# Patient Record
Sex: Male | Born: 1940 | Race: White | Hispanic: No | Marital: Married | Smoking: Former smoker
Health system: Southern US, Community
[De-identification: ages and names within clinical notes are randomized; demographics above are authoritative.]

## PROBLEM LIST (undated history)

## (undated) DIAGNOSIS — J45909 Unspecified asthma, uncomplicated: Secondary | ICD-10-CM

## (undated) DIAGNOSIS — M1990 Unspecified osteoarthritis, unspecified site: Secondary | ICD-10-CM

## (undated) DIAGNOSIS — C259 Malignant neoplasm of pancreas, unspecified: Secondary | ICD-10-CM

## (undated) DIAGNOSIS — R55 Syncope and collapse: Secondary | ICD-10-CM

## (undated) DIAGNOSIS — N200 Calculus of kidney: Secondary | ICD-10-CM

## (undated) DIAGNOSIS — E785 Hyperlipidemia, unspecified: Secondary | ICD-10-CM

## (undated) DIAGNOSIS — I509 Heart failure, unspecified: Secondary | ICD-10-CM

## (undated) DIAGNOSIS — F419 Anxiety disorder, unspecified: Secondary | ICD-10-CM

## (undated) DIAGNOSIS — I219 Acute myocardial infarction, unspecified: Secondary | ICD-10-CM

## (undated) DIAGNOSIS — G709 Myoneural disorder, unspecified: Secondary | ICD-10-CM

## (undated) DIAGNOSIS — D649 Anemia, unspecified: Secondary | ICD-10-CM

## (undated) DIAGNOSIS — Z9889 Other specified postprocedural states: Secondary | ICD-10-CM

## (undated) DIAGNOSIS — E119 Type 2 diabetes mellitus without complications: Secondary | ICD-10-CM

## (undated) DIAGNOSIS — I2510 Atherosclerotic heart disease of native coronary artery without angina pectoris: Secondary | ICD-10-CM

## (undated) DIAGNOSIS — C801 Malignant (primary) neoplasm, unspecified: Secondary | ICD-10-CM

## (undated) DIAGNOSIS — H269 Unspecified cataract: Secondary | ICD-10-CM

## (undated) DIAGNOSIS — K219 Gastro-esophageal reflux disease without esophagitis: Secondary | ICD-10-CM

## (undated) DIAGNOSIS — G4730 Sleep apnea, unspecified: Secondary | ICD-10-CM

## (undated) DIAGNOSIS — N400 Enlarged prostate without lower urinary tract symptoms: Secondary | ICD-10-CM

## (undated) HISTORY — PX: EYE SURGERY: SHX253

## (undated) HISTORY — DX: Hyperlipidemia, unspecified: E78.5

## (undated) HISTORY — DX: Atherosclerotic heart disease of native coronary artery without angina pectoris: I25.10

## (undated) HISTORY — DX: Syncope and collapse: R55

## (undated) HISTORY — DX: Other specified postprocedural states: Z98.890

## (undated) HISTORY — PX: OTHER SURGICAL HISTORY: SHX169

## (undated) HISTORY — DX: Sleep apnea, unspecified: G47.30

## (undated) HISTORY — DX: Anxiety disorder, unspecified: F41.9

## (undated) HISTORY — DX: Malignant neoplasm of pancreas, unspecified: C25.9

## (undated) HISTORY — DX: Benign prostatic hyperplasia without lower urinary tract symptoms: N40.0

## (undated) HISTORY — DX: Unspecified asthma, uncomplicated: J45.909

---

## 2003-12-22 HISTORY — PX: SPINE SURGERY: SHX786

## 2004-01-18 ENCOUNTER — Inpatient Hospital Stay (HOSPITAL_COMMUNITY): Admission: RE | Admit: 2004-01-18 | Discharge: 2004-01-21 | Payer: Self-pay | Admitting: Neurosurgery

## 2004-01-22 ENCOUNTER — Inpatient Hospital Stay (HOSPITAL_COMMUNITY): Admission: EM | Admit: 2004-01-22 | Discharge: 2004-01-25 | Payer: Self-pay | Admitting: Emergency Medicine

## 2005-06-25 ENCOUNTER — Ambulatory Visit: Payer: Self-pay | Admitting: Internal Medicine

## 2005-06-27 ENCOUNTER — Ambulatory Visit: Payer: Self-pay | Admitting: *Deleted

## 2005-07-30 ENCOUNTER — Ambulatory Visit: Payer: Self-pay | Admitting: Internal Medicine

## 2005-08-29 ENCOUNTER — Ambulatory Visit: Payer: Self-pay | Admitting: Internal Medicine

## 2006-03-15 ENCOUNTER — Emergency Department: Payer: Self-pay | Admitting: Emergency Medicine

## 2006-06-02 ENCOUNTER — Other Ambulatory Visit: Payer: Self-pay

## 2006-06-02 ENCOUNTER — Inpatient Hospital Stay: Payer: Self-pay | Admitting: Internal Medicine

## 2006-06-03 ENCOUNTER — Other Ambulatory Visit: Payer: Self-pay

## 2006-06-03 DIAGNOSIS — I2510 Atherosclerotic heart disease of native coronary artery without angina pectoris: Secondary | ICD-10-CM

## 2006-06-03 HISTORY — DX: Atherosclerotic heart disease of native coronary artery without angina pectoris: I25.10

## 2006-11-17 ENCOUNTER — Ambulatory Visit: Payer: Self-pay | Admitting: Otolaryngology

## 2006-11-19 ENCOUNTER — Ambulatory Visit: Payer: Self-pay | Admitting: Otolaryngology

## 2007-07-02 ENCOUNTER — Other Ambulatory Visit: Payer: Self-pay

## 2007-07-03 ENCOUNTER — Inpatient Hospital Stay: Payer: Self-pay | Admitting: Internal Medicine

## 2007-12-16 ENCOUNTER — Ambulatory Visit: Payer: Self-pay | Admitting: Internal Medicine

## 2010-04-08 ENCOUNTER — Inpatient Hospital Stay: Payer: Self-pay | Admitting: Internal Medicine

## 2010-06-06 ENCOUNTER — Ambulatory Visit: Payer: Self-pay | Admitting: Internal Medicine

## 2011-12-03 ENCOUNTER — Ambulatory Visit: Payer: Self-pay | Admitting: Cardiology

## 2011-12-04 ENCOUNTER — Encounter: Payer: Self-pay | Admitting: *Deleted

## 2011-12-04 ENCOUNTER — Encounter: Payer: Self-pay | Admitting: Surgery

## 2011-12-04 ENCOUNTER — Institutional Professional Consult (permissible substitution) (INDEPENDENT_AMBULATORY_CARE_PROVIDER_SITE_OTHER): Payer: Self-pay | Admitting: Surgery

## 2011-12-04 VITALS — BP 150/72 | HR 73 | Resp 16 | Ht 68.0 in | Wt 205.0 lb

## 2011-12-04 DIAGNOSIS — I2510 Atherosclerotic heart disease of native coronary artery without angina pectoris: Secondary | ICD-10-CM

## 2011-12-04 DIAGNOSIS — F419 Anxiety disorder, unspecified: Secondary | ICD-10-CM | POA: Insufficient documentation

## 2011-12-04 DIAGNOSIS — R55 Syncope and collapse: Secondary | ICD-10-CM | POA: Insufficient documentation

## 2011-12-04 DIAGNOSIS — E785 Hyperlipidemia, unspecified: Secondary | ICD-10-CM | POA: Insufficient documentation

## 2011-12-04 DIAGNOSIS — J45909 Unspecified asthma, uncomplicated: Secondary | ICD-10-CM | POA: Insufficient documentation

## 2011-12-04 DIAGNOSIS — G4730 Sleep apnea, unspecified: Secondary | ICD-10-CM | POA: Insufficient documentation

## 2011-12-04 DIAGNOSIS — N400 Enlarged prostate without lower urinary tract symptoms: Secondary | ICD-10-CM | POA: Insufficient documentation

## 2011-12-04 NOTE — Progress Notes (Signed)
[address]            [place] [address]          [phone]       PCP is Bethann Punches, MD Referring Provider is Dalia Heading, MD  Chief Complaint  Patient presents with  . Chest Pain    coronary artery disease...cathed by Dr. Lady Gary 12/03/11....eval for surgery    HPI:  The patient is 71 year old gentleman with diabetes and known coronary disease status post stenting of his right coronary artery in February 2008 by Dr. Lady Gary. He subsequently had an exercise treadmill test in February of 2009 that showed a left ventricular ejection fraction of 40% with mild global hypokinesis. There was inferolateral scar with no significant reversible ischemia and good exercise tolerance. He now presents with a 4-5 month history of intermittent substernal dull chest pressure and pain associated with shortness of breath that has progressed. He now has episodes while walking around his house. He underwent a myocardial perfusion stress test at the [facility] on July 8 which showed evidence of multivessel coronary disease with reversible ischemia involving the anterior, anterior lateral, lateral, and inferolateral walls as well as the apex. The test was stopped due to decrease in blood pressure and fatigue. The patient had chest pain during the procedure. He underwent cardiac catheterization at [facility] yesterday which showed a 40% ostial left main stenosis. The LAD had a calcified proximal tubular 75% stenosis. There is a very small first diagonal that has 99% stenosis. The left circumflex gave off an intermediate branch that had subtotal occlusion and appeared to be a smal to moderate size vessel. The left circumflex terminated as a moderate-sized third marginal vessel with a 75% stenosis. The right coronary was occluded proximally with filling of the distal vessel by collaterals from the left. Left ventricular function appeared well preserved. There is  no gradient across aortic valve.  Past Medical History  Diagnosis Date  . Diabetes mellitus   . Hyperlipidemia   . CAD (coronary artery disease) 06/03/06    cypher 2.5 x 28mm DES for 80% mid RCA  . H/O cardiac catheterization 06/03/06,12/03/11  . Sleep apnea   . Asthma   . BPH (benign prostatic hypertrophy)   . Anxiety   . Syncope     LAUGHING INDUCED    Past Surgical History  Procedure Date  . Spine surgery 9/05    CERVICAL LAMINECTOMY    History reviewed. No pertinent family history.  Social History History  Substance Use Topics  . Smoking status: Former Smoker    Types: Cigarettes, Cigars    Quit date: 12/03/1977  . Smokeless tobacco: Never Used  . Alcohol Use: No    Current Outpatient Prescriptions  Medication Sig Dispense Refill  . aspirin 81 MG chewable tablet Chew 81 mg by mouth daily.      . fish oil-omega-3 fatty acids 1000 MG capsule Take 1 g by mouth 2 (two) times daily.      . furosemide (LASIX) 40 MG tablet Take 40 mg by mouth daily.      Marland Kitchen gabapentin (NEURONTIN) 300 MG capsule Take 300 mg by mouth 2 (two) times daily.      . insulin glargine (LANTUS) 100 UNIT/ML injection Inject 60 Units into the skin 2 (two) times daily.       . Iron-Vitamin C (VITRON-C) 65-125  MG TABS Take by mouth 1 day or 1 dose.      . isosorbide mononitrate (IMDUR) 60 MG 24 hr tablet Take 60 mg by mouth daily.      Marland Kitchen lovastatin (MEVACOR) 40 MG tablet Take 40 mg by mouth at bedtime.      . meloxicam (MOBIC) 15 MG tablet Take 7.5 mg by mouth.      . metFORMIN (GLUMETZA) 1000 MG (MOD) 24 hr tablet Take 1,000 mg by mouth 2 (two) times daily with a meal.      . metoprolol tartrate (LOPRESSOR) 25 MG tablet Take 25 mg by mouth 1 day or 1 dose.      Marland Kitchen omeprazole (PRILOSEC) 20 MG capsule Take 20 mg by mouth 2 (two) times daily.      . Potassium Aminobenzoate 500 MG CAPS Take by mouth 1 day or 1 dose.        Allergies  Allergen Reactions  . Zithromax (Azithromycin) Anaphylaxis    Swells,  can't breathe    Review of Systems  Constitutional: Positive for activity change and fatigue. Negative for fever, chills, diaphoresis, appetite change and unexpected weight change.  HENT: Negative.   Eyes: Negative.   Respiratory: Positive for chest tightness and shortness of breath.   Cardiovascular: Positive for chest pain and leg swelling.  Gastrointestinal: Positive for constipation.  Genitourinary: Negative.   Musculoskeletal: Positive for arthralgias.  Neurological: Positive for numbness.       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  Hematological: Negative.   Psychiatric/Behavioral: Negative.     BP 150/72  Pulse 73  Resp 16  Ht 5\' 8"  (1.727 m)  Wt 205 lb (92.987 kg)  BMI 31.17 kg/m2  SpO2 96% Physical Exam  Constitutional: He is oriented to person, place, and time. He appears well-developed and well-nourished. No distress.  HENT:  Head: Normocephalic and atraumatic.  Mouth/Throat: Oropharynx is clear and moist.  Eyes: Conjunctivae and EOM are normal. Pupils are equal, round, and reactive to light.  Neck: Normal range of motion. Neck supple. No JVD present. No tracheal deviation present. No thyromegaly present.  Cardiovascular: Normal rate, regular rhythm and intact distal pulses.  Exam reveals no gallop and no friction rub.   No murmur heard. Pulmonary/Chest: Effort normal and breath sounds normal. No respiratory distress. He has no rales.  Abdominal: Soft. Bowel sounds are normal. He exhibits no distension and no mass. There is no tenderness.  Musculoskeletal: Normal range of motion. He exhibits edema.       Mild in both lower legs  Lymphadenopathy:    He has no cervical adenopathy.  Neurological: He is alert and oriented to person, place, and time. No cranial nerve deficit or sensory deficit.  Skin: Skin is warm and dry.  Psychiatric: He has a normal mood and affect.     Impression/Plan:  He has severe multivessel coronary disease with mild global left ventricular dysfunction  with ejection fraction of 43% by nuclear stress test. He has worsening exertional angina that is limiting his activity and I agree that it would be best to proceed with coronary bypass graft surgery. I discussed the operative procedure with the patient and family including alternatives, benefits and risks; including but not limited to bleeding, blood transfusion, infection, stroke, myocardial infarction, graft failure, heart block requiring a permanent pacemaker, organ dysfunction, and death.  Jacelyn Pi Schappell understands and agrees to proceed.  We will schedule surgery for Thursday, 12/12/2011.

## 2011-12-05 ENCOUNTER — Other Ambulatory Visit: Payer: Self-pay | Admitting: *Deleted

## 2011-12-05 ENCOUNTER — Encounter (HOSPITAL_COMMUNITY): Payer: Self-pay | Admitting: Pharmacy Technician

## 2011-12-05 DIAGNOSIS — I2510 Atherosclerotic heart disease of native coronary artery without angina pectoris: Secondary | ICD-10-CM

## 2011-12-10 ENCOUNTER — Other Ambulatory Visit (HOSPITAL_COMMUNITY): Payer: Self-pay

## 2011-12-10 ENCOUNTER — Inpatient Hospital Stay (HOSPITAL_COMMUNITY)
Admission: RE | Admit: 2011-12-10 | Discharge: 2011-12-10 | Disposition: A | Discharge status: Home / Self Care | Payer: Medicare Other | Source: Ambulatory Visit | Attending: Surgery | Admitting: Surgery

## 2011-12-10 ENCOUNTER — Encounter (HOSPITAL_COMMUNITY)
Admission: RE | Admit: 2011-12-10 | Discharge: 2011-12-10 | Disposition: A | Discharge status: Home / Self Care | Payer: Medicare Other | Source: Ambulatory Visit | Attending: Surgery | Admitting: Surgery

## 2011-12-10 ENCOUNTER — Encounter (HOSPITAL_COMMUNITY): Payer: Self-pay

## 2011-12-10 ENCOUNTER — Other Ambulatory Visit (HOSPITAL_COMMUNITY): Payer: Medicare Other

## 2011-12-10 ENCOUNTER — Ambulatory Visit (HOSPITAL_COMMUNITY)
Admission: RE | Admit: 2011-12-10 | Discharge: 2011-12-10 | Disposition: A | Discharge status: Home / Self Care | Payer: Medicare Other | Source: Ambulatory Visit | Attending: Surgery | Admitting: Surgery

## 2011-12-10 VITALS — BP 161/103 | HR 98 | Temp 97.8°F | Resp 18 | Ht 67.0 in | Wt 208.3 lb

## 2011-12-10 DIAGNOSIS — Z01812 Encounter for preprocedural laboratory examination: Secondary | ICD-10-CM | POA: Insufficient documentation

## 2011-12-10 DIAGNOSIS — I2510 Atherosclerotic heart disease of native coronary artery without angina pectoris: Secondary | ICD-10-CM

## 2011-12-10 DIAGNOSIS — Z01810 Encounter for preprocedural cardiovascular examination: Secondary | ICD-10-CM

## 2011-12-10 DIAGNOSIS — Z01818 Encounter for other preprocedural examination: Secondary | ICD-10-CM | POA: Insufficient documentation

## 2011-12-10 DIAGNOSIS — R079 Chest pain, unspecified: Secondary | ICD-10-CM | POA: Insufficient documentation

## 2011-12-10 DIAGNOSIS — R0602 Shortness of breath: Secondary | ICD-10-CM | POA: Insufficient documentation

## 2011-12-10 HISTORY — DX: Malignant (primary) neoplasm, unspecified: C80.1

## 2011-12-10 HISTORY — DX: Heart failure, unspecified: I50.9

## 2011-12-10 HISTORY — DX: Gastro-esophageal reflux disease without esophagitis: K21.9

## 2011-12-10 HISTORY — DX: Unspecified osteoarthritis, unspecified site: M19.90

## 2011-12-10 HISTORY — DX: Anemia, unspecified: D64.9

## 2011-12-10 HISTORY — DX: Myoneural disorder, unspecified: G70.9

## 2011-12-10 HISTORY — DX: Acute myocardial infarction, unspecified: I21.9

## 2011-12-10 HISTORY — DX: Calculus of kidney: N20.0

## 2011-12-10 HISTORY — DX: Unspecified cataract: H26.9

## 2011-12-10 LAB — URINALYSIS, ROUTINE W REFLEX MICROSCOPIC
Bilirubin Urine: NEGATIVE
Glucose, UA: >1000 mg/dL — AB
Hgb urine dipstick: NEGATIVE
Specific Gravity, Urine: 1.015 (ref 1.005–1.030)
Urobilinogen, UA: 0.2 mg/dL (ref 0.0–1.0)
pH: 5 (ref 5.0–8.0)

## 2011-12-10 LAB — COMPREHENSIVE METABOLIC PANEL
AST: 17 U/L (ref 0–37)
BUN: 14 mg/dL (ref 6–23)
CO2: 29 mEq/L (ref 19–32)
Calcium: 9.6 mg/dL (ref 8.4–10.5)
Chloride: 103 mEq/L (ref 96–112)
Creatinine, Ser: 0.93 mg/dL (ref 0.50–1.35)
GFR calc Af Amer: >90 mL/min (ref >90)
GFR calc non Af Amer: 83 mL/min — ABNORMAL LOW (ref >90)
Total Bilirubin: 0.2 mg/dL — ABNORMAL LOW (ref 0.3–1.2)

## 2011-12-10 LAB — PROTIME-INR: Prothrombin Time: 13 seconds (ref 11.6–15.2)

## 2011-12-10 LAB — PULMONARY FUNCTION TEST

## 2011-12-10 LAB — CBC
HCT: 37.7 % — ABNORMAL LOW (ref 39.0–52.0)
MCH: 24.9 pg — ABNORMAL LOW (ref 26.0–34.0)
MCV: 79.5 fL (ref 78.0–100.0)
Platelets: 172 10*3/uL (ref 150–400)
RBC: 4.74 MIL/uL (ref 4.22–5.81)
WBC: 8.6 10*3/uL (ref 4.0–10.5)

## 2011-12-10 LAB — BLOOD GAS, ARTERIAL
Acid-Base Excess: 1.8 mmol/L (ref 0.0–2.0)
Drawn by: 206361
O2 Saturation: 96.1 %
TCO2: 26.6 mmol/L (ref 0–100)
pO2, Arterial: 82.6 mmHg (ref 80.0–100.0)

## 2011-12-10 LAB — TYPE AND SCREEN: Antibody Screen: NEGATIVE

## 2011-12-10 LAB — APTT: aPTT: 28 seconds (ref 24–37)

## 2011-12-10 LAB — SURGICAL PCR SCREEN: MRSA, PCR: NEGATIVE

## 2011-12-10 LAB — ABO/RH: ABO/RH(D): A POS

## 2011-12-10 MED ORDER — ALBUTEROL SULFATE (5 MG/ML) 0.5% IN NEBU
2.5000 mg | INHALATION_SOLUTION | Freq: Once | RESPIRATORY_TRACT | Status: AC
  Administered 2011-12-10: 2.5 mg via RESPIRATORY_TRACT

## 2011-12-10 MED ORDER — CHLORHEXIDINE GLUCONATE 4 % EX LIQD: 30.0000 mL | CUTANEOUS | Status: DC

## 2011-12-10 NOTE — Progress Notes (Signed)
Contacted Dr. America Brown office [phone], spoke with Eunice Blase Requested cardiac cath, office note and EKG, Eunice Blase states does not have EKG on file.

## 2011-12-10 NOTE — Pre-Procedure Instructions (Addendum)
[address] Mohamed  12/10/2011   Your procedure is scheduled on:  Thursday December 12, 2011  Report to [facility] at 8:30 AM.  Call this number if you have problems the morning of surgery: [phone]   Remember:   Do not eat food or drink:After Midnight.      Take these medicines the morning of surgery with A SIP OF WATER: gabapentin, isosorbide, omeprazole, (STOP TAKING NSAIDS)   Do not wear jewelry, make-up or nail polish.  Do not wear lotions, powders, or perfumes. You may wear deodorant.  Do not shave 48 hours prior to surgery. Men may shave face and neck.  Do not bring valuables to the hospital.  Contacts, dentures or bridgework may not be worn into surgery.  Leave suitcase in the car. After surgery it may be brought to your room.  For patients admitted to the hospital, checkout time is 11:00 AM the day of discharge.   Patients discharged the day of surgery will not be allowed to drive home.  Name and phone number of your driver: Seiji Wiswell [phone]  Special Instructions: Incentive Spirometry - Practice and bring it with you on the day of surgery. and CHG Shower Use Special Wash: 1/2 bottle night before surgery and 1/2 bottle morning of surgery.   Please read over the following fact sheets that you were given: Pain Booklet, Coughing and Deep Breathing, Blood Transfusion Information, Open Heart Packet, MRSA Information and Surgical Site Infection Prevention

## 2011-12-10 NOTE — Progress Notes (Signed)
VASCULAR LAB PRELIMINARY  PRELIMINARY  PRELIMINARY  PRELIMINARY  Pre-op Cardiac Surgery  Carotid Findings:  Right - No evidence of significant ICA stenosis. Left - 40% to 59% upper end of range ICA stenosis. Bilateral - Vertebral artery flow is antegrade  Upper Extremity Right Left  Brachial Pressures 181 Triphasic 183 Triphasic  Radial Waveforms Triphasic Triphasic  Ulnar Waveforms Sharp monophasic Sharp monophasic  Palmar Arch (Allen's Test) Abnorma Abnormal   Findings:  Right - remains normal with radial compression and reverses with ulnar compression. Left - Obliterates with radial compression and remains normal with ulnar compression    Lower  Extremity Right Left  Dorsalis Pedis 192 Sharp Monophasic 184 Biphasic      Posterior Tibial 193 Biphasic 196 Sharp Monophasic  Ankle/Brachial Indices 1.05 1.08     Findings:  ABIs indicate normal arterial flow bilaterally at rest.   Kailash Hinze, 12/10/2011, 2:42 PM

## 2011-12-11 LAB — HEMOGLOBIN A1C
Hgb A1c MFr Bld: 8.8 % — ABNORMAL HIGH (ref <5.7)
Mean Plasma Glucose: 206 mg/dL — ABNORMAL HIGH (ref <117)

## 2011-12-11 MED ORDER — DEXTROSE 5 % IV SOLN
1.5000 g | INTRAVENOUS | Status: AC
  Administered 2011-12-12: 1.5 g via INTRAVENOUS
  Administered 2011-12-12: .75 g via INTRAVENOUS
  Filled 2011-12-11: qty 1.5

## 2011-12-11 MED ORDER — TRANEXAMIC ACID (OHS) BOLUS VIA INFUSION
15.0000 mg/kg | INTRAVENOUS | Status: AC
  Administered 2011-12-12: 1414.5 mg via INTRAVENOUS
  Filled 2011-12-11: qty 1415

## 2011-12-11 MED ORDER — DEXMEDETOMIDINE HCL IN NACL 400 MCG/100ML IV SOLN
0.1000 ug/kg/h | INTRAVENOUS | Status: AC
  Administered 2011-12-12: 0.2 ug/kg/h via INTRAVENOUS
  Filled 2011-12-11: qty 100

## 2011-12-11 MED ORDER — DEXTROSE 5 % IV SOLN
750.0000 mg | INTRAVENOUS | Status: DC
  Filled 2011-12-11: qty 750

## 2011-12-11 MED ORDER — TRANEXAMIC ACID 100 MG/ML IV SOLN
1.5000 mg/kg/h | INTRAVENOUS | Status: AC
  Administered 2011-12-12: 1.5 mg/kg/h via INTRAVENOUS
  Filled 2011-12-11: qty 25

## 2011-12-11 MED ORDER — TRANEXAMIC ACID (OHS) PUMP PRIME SOLUTION
2.0000 mg/kg | INTRAVENOUS | Status: DC
  Filled 2011-12-11: qty 1.89

## 2011-12-11 MED ORDER — SODIUM CHLORIDE 0.9 % IV SOLN
INTRAVENOUS | Status: AC
  Administered 2011-12-12: 4.6 [IU]/h via INTRAVENOUS
  Filled 2011-12-11: qty 1

## 2011-12-11 MED ORDER — DOPAMINE-DEXTROSE 3.2-5 MG/ML-% IV SOLN
2.0000 ug/kg/min | INTRAVENOUS | Status: DC
  Filled 2011-12-11: qty 250

## 2011-12-11 MED ORDER — VANCOMYCIN HCL 1000 MG IV SOLR
1500.0000 mg | INTRAVENOUS | Status: AC
  Administered 2011-12-12: 1500 mg via INTRAVENOUS
  Filled 2011-12-11: qty 1500

## 2011-12-11 MED ORDER — NITROGLYCERIN IN D5W 200-5 MCG/ML-% IV SOLN
2.0000 ug/min | INTRAVENOUS | Status: AC
  Administered 2011-12-12: 5 ug/min via INTRAVENOUS
  Filled 2011-12-11: qty 250

## 2011-12-11 MED ORDER — DEXTROSE 5 % IV SOLN
30.0000 ug/min | INTRAVENOUS | Status: DC
  Filled 2011-12-11: qty 2

## 2011-12-11 MED ORDER — METOPROLOL TARTRATE 12.5 MG HALF TABLET: 12.5000 mg | ORAL_TABLET | Freq: Once | ORAL | Status: DC

## 2011-12-11 MED ORDER — SODIUM BICARBONATE 8.4 % IV SOLN
INTRAVENOUS | Status: AC
  Administered 2011-12-12: 14:00:00
  Filled 2011-12-11 (×2): qty 2.5

## 2011-12-11 MED ORDER — EPINEPHRINE HCL 1 MG/ML IJ SOLN
0.5000 ug/min | INTRAVENOUS | Status: DC
  Filled 2011-12-11: qty 4

## 2011-12-11 MED ORDER — POTASSIUM CHLORIDE 2 MEQ/ML IV SOLN
80.0000 meq | INTRAVENOUS | Status: DC
  Filled 2011-12-11: qty 40

## 2011-12-11 MED ORDER — MAGNESIUM SULFATE 50 % IJ SOLN
40.0000 meq | INTRAMUSCULAR | Status: DC
  Filled 2011-12-11: qty 10

## 2011-12-11 NOTE — Consult Note (Signed)
This is a 71 year old patient who is scheduled to have CABG surgery by Dr. Alleen Borne on 12 December 2011 secondary to CAD.  Pre-CABG EKG dated 10 December 2011 noted.  Pre-CABG CXR dated 10 December 2011- IMPRESSION: No acute cardiopulmonary abnormality.   Pre-CABG Lab results dated 10 December 2011 reviewed and show mild anemia and some results consistent with diabetes. Type and screen has been done.  Pre-CABG PFT performed 10 December 2011 and results are on the chart.  Pre-CABG doppler study performed 10 December 2011, results are in EPI   Report of cardiac cath performed 03 December 2011 and Dr. York Pellant last office noted dated 28 November 2011 are both on the chart.   May proceed with surgery as scheduled.  Kelton Pillar. Haygen Zebrowski, PA-C

## 2011-12-12 ENCOUNTER — Inpatient Hospital Stay (HOSPITAL_COMMUNITY): Payer: Medicare Other

## 2011-12-12 ENCOUNTER — Encounter (HOSPITAL_COMMUNITY): Payer: Self-pay | Admitting: Anesthesiology

## 2011-12-12 ENCOUNTER — Ambulatory Visit (HOSPITAL_COMMUNITY): Payer: Medicare Other | Admitting: Anesthesiology

## 2011-12-12 ENCOUNTER — Inpatient Hospital Stay (HOSPITAL_COMMUNITY)
Admission: RE | Admit: 2011-12-12 | Discharge: 2011-12-22 | DRG: 236 | Disposition: A | Discharge status: Home / Self Care | Payer: Medicare Other | Source: Ambulatory Visit | Attending: Surgery | Admitting: Surgery

## 2011-12-12 ENCOUNTER — Encounter (HOSPITAL_COMMUNITY): Payer: Self-pay | Admitting: *Deleted

## 2011-12-12 ENCOUNTER — Encounter (HOSPITAL_COMMUNITY)
Admission: RE | Disposition: A | Discharge status: Home / Self Care | Payer: Self-pay | Source: Ambulatory Visit | Attending: Surgery

## 2011-12-12 DIAGNOSIS — E8779 Other fluid overload: Secondary | ICD-10-CM | POA: Diagnosis not present

## 2011-12-12 DIAGNOSIS — I2510 Atherosclerotic heart disease of native coronary artery without angina pectoris: Secondary | ICD-10-CM

## 2011-12-12 DIAGNOSIS — D62 Acute posthemorrhagic anemia: Secondary | ICD-10-CM | POA: Diagnosis not present

## 2011-12-12 DIAGNOSIS — F411 Generalized anxiety disorder: Secondary | ICD-10-CM | POA: Diagnosis present

## 2011-12-12 DIAGNOSIS — Z79899 Other long term (current) drug therapy: Secondary | ICD-10-CM

## 2011-12-12 DIAGNOSIS — T40605A Adverse effect of unspecified narcotics, initial encounter: Secondary | ICD-10-CM | POA: Diagnosis present

## 2011-12-12 DIAGNOSIS — Y9210 Unspecified residential institution as the place of occurrence of the external cause: Secondary | ICD-10-CM | POA: Diagnosis present

## 2011-12-12 DIAGNOSIS — N39498 Other specified urinary incontinence: Secondary | ICD-10-CM | POA: Diagnosis present

## 2011-12-12 DIAGNOSIS — N138 Other obstructive and reflux uropathy: Secondary | ICD-10-CM | POA: Diagnosis present

## 2011-12-12 DIAGNOSIS — Z951 Presence of aortocoronary bypass graft: Secondary | ICD-10-CM

## 2011-12-12 DIAGNOSIS — K5900 Constipation, unspecified: Secondary | ICD-10-CM | POA: Diagnosis present

## 2011-12-12 DIAGNOSIS — N401 Enlarged prostate with lower urinary tract symptoms: Secondary | ICD-10-CM | POA: Diagnosis present

## 2011-12-12 DIAGNOSIS — Z9861 Coronary angioplasty status: Secondary | ICD-10-CM

## 2011-12-12 DIAGNOSIS — Z87891 Personal history of nicotine dependence: Secondary | ICD-10-CM

## 2011-12-12 DIAGNOSIS — E785 Hyperlipidemia, unspecified: Secondary | ICD-10-CM | POA: Diagnosis present

## 2011-12-12 DIAGNOSIS — F19951 Other psychoactive substance use, unspecified with psychoactive substance-induced psychotic disorder with hallucinations: Secondary | ICD-10-CM | POA: Diagnosis not present

## 2011-12-12 DIAGNOSIS — T50995A Adverse effect of other drugs, medicaments and biological substances, initial encounter: Secondary | ICD-10-CM | POA: Diagnosis present

## 2011-12-12 DIAGNOSIS — I10 Essential (primary) hypertension: Secondary | ICD-10-CM | POA: Diagnosis present

## 2011-12-12 DIAGNOSIS — G4730 Sleep apnea, unspecified: Secondary | ICD-10-CM | POA: Diagnosis present

## 2011-12-12 DIAGNOSIS — Z794 Long term (current) use of insulin: Secondary | ICD-10-CM

## 2011-12-12 DIAGNOSIS — J45909 Unspecified asthma, uncomplicated: Secondary | ICD-10-CM | POA: Diagnosis present

## 2011-12-12 DIAGNOSIS — F19921 Other psychoactive substance use, unspecified with intoxication with delirium: Secondary | ICD-10-CM | POA: Diagnosis not present

## 2011-12-12 DIAGNOSIS — E1142 Type 2 diabetes mellitus with diabetic polyneuropathy: Secondary | ICD-10-CM | POA: Diagnosis present

## 2011-12-12 DIAGNOSIS — D696 Thrombocytopenia, unspecified: Secondary | ICD-10-CM | POA: Diagnosis not present

## 2011-12-12 DIAGNOSIS — Z7982 Long term (current) use of aspirin: Secondary | ICD-10-CM

## 2011-12-12 DIAGNOSIS — E1149 Type 2 diabetes mellitus with other diabetic neurological complication: Secondary | ICD-10-CM | POA: Diagnosis present

## 2011-12-12 HISTORY — PX: CORONARY ARTERY BYPASS GRAFT: SHX141

## 2011-12-12 LAB — POCT I-STAT 3, ART BLOOD GAS (G3+)
Acid-Base Excess: 2 mmol/L (ref 0.0–2.0)
Acid-base deficit: 1 mmol/L (ref 0.0–2.0)
Bicarbonate: 26.3 mEq/L — ABNORMAL HIGH (ref 20.0–24.0)
Bicarbonate: 24.6 mEq/L — ABNORMAL HIGH (ref 20.0–24.0)
Bicarbonate: 25 mEq/L — ABNORMAL HIGH (ref 20.0–24.0)
O2 Saturation: 100 %
O2 Saturation: 99 %
O2 Saturation: 99 %
Patient temperature: 36.2
TCO2: 29 mmol/L (ref 0–100)
TCO2: 27 mmol/L (ref 0–100)
TCO2: 26 mmol/L (ref 0–100)
TCO2: 26 mmol/L (ref 0–100)
pCO2 arterial: 36 mmHg (ref 35.0–45.0)
pCO2 arterial: 43.7 mmHg (ref 35.0–45.0)
pCO2 arterial: 42.7 mmHg (ref 35.0–45.0)
pH, Arterial: 7.471 — ABNORMAL HIGH (ref 7.350–7.450)
pH, Arterial: 7.374 (ref 7.350–7.450)
pO2, Arterial: 270 mmHg — ABNORMAL HIGH (ref 80.0–100.0)
pO2, Arterial: 259 mmHg — ABNORMAL HIGH (ref 80.0–100.0)
pO2, Arterial: 103 mmHg — ABNORMAL HIGH (ref 80.0–100.0)
pO2, Arterial: 119 mmHg — ABNORMAL HIGH (ref 80.0–100.0)

## 2011-12-12 LAB — POCT I-STAT 4, (NA,K, GLUC, HGB,HCT)
Glucose, Bld: 213 mg/dL — ABNORMAL HIGH (ref 70–99)
Glucose, Bld: 143 mg/dL — ABNORMAL HIGH (ref 70–99)
Glucose, Bld: 142 mg/dL — ABNORMAL HIGH (ref 70–99)
Glucose, Bld: 153 mg/dL — ABNORMAL HIGH (ref 70–99)
HCT: 31 % — ABNORMAL LOW (ref 39.0–52.0)
HCT: 23 % — ABNORMAL LOW (ref 39.0–52.0)
HCT: 24 % — ABNORMAL LOW (ref 39.0–52.0)
Hemoglobin: 10.5 g/dL — ABNORMAL LOW (ref 13.0–17.0)
Hemoglobin: 10.2 g/dL — ABNORMAL LOW (ref 13.0–17.0)
Hemoglobin: 7.8 g/dL — ABNORMAL LOW (ref 13.0–17.0)
Hemoglobin: 8.2 g/dL — ABNORMAL LOW (ref 13.0–17.0)
Hemoglobin: 7.8 g/dL — ABNORMAL LOW (ref 13.0–17.0)
Potassium: 3.9 mEq/L (ref 3.5–5.1)
Potassium: 3.6 mEq/L (ref 3.5–5.1)
Potassium: 5.8 mEq/L — ABNORMAL HIGH (ref 3.5–5.1)
Potassium: 4.3 mEq/L (ref 3.5–5.1)
Potassium: 4.4 mEq/L (ref 3.5–5.1)
Sodium: 142 mEq/L (ref 135–145)
Sodium: 138 mEq/L (ref 135–145)
Sodium: 140 mEq/L (ref 135–145)

## 2011-12-12 LAB — PROTIME-INR: Prothrombin Time: 17.5 seconds — ABNORMAL HIGH (ref 11.6–15.2)

## 2011-12-12 LAB — CBC
MCH: 25 pg — ABNORMAL LOW (ref 26.0–34.0)
Platelets: 122 10*3/uL — ABNORMAL LOW (ref 150–400)
RBC: 3.56 MIL/uL — ABNORMAL LOW (ref 4.22–5.81)
WBC: 10.6 10*3/uL — ABNORMAL HIGH (ref 4.0–10.5)

## 2011-12-12 LAB — GLUCOSE, CAPILLARY: Glucose-Capillary: 231 mg/dL — ABNORMAL HIGH (ref 70–99)

## 2011-12-12 SURGERY — CORONARY ARTERY BYPASS GRAFTING (CABG)
Anesthesia: General | Site: Chest | Wound class: Clean

## 2011-12-12 MED ORDER — HEMOSTATIC AGENTS (NO CHARGE) OPTIME
TOPICAL | Status: DC | PRN
  Administered 2011-12-12: 1 via TOPICAL

## 2011-12-12 MED ORDER — MORPHINE SULFATE 2 MG/ML IJ SOLN: 1.0000 mg | INTRAMUSCULAR | Status: DC | PRN

## 2011-12-12 MED ORDER — LACTATED RINGERS IV SOLN: INTRAVENOUS | Status: DC

## 2011-12-12 MED ORDER — FAMOTIDINE IN NACL 20-0.9 MG/50ML-% IV SOLN
20.0000 mg | Freq: Two times a day (BID) | INTRAVENOUS | Status: DC
  Administered 2011-12-12: 20 mg via INTRAVENOUS

## 2011-12-12 MED ORDER — THROMBIN 20000 UNITS EX KIT
PACK | CUTANEOUS | Status: DC | PRN
  Administered 2011-12-12: 20000 [IU] via TOPICAL

## 2011-12-12 MED ORDER — INSULIN REGULAR BOLUS VIA INFUSION
0.0000 [IU] | Freq: Three times a day (TID) | INTRAVENOUS | Status: DC
  Filled 2011-12-12: qty 10

## 2011-12-12 MED ORDER — HEPARIN SODIUM (PORCINE) 1000 UNIT/ML IJ SOLN
INTRAMUSCULAR | Status: DC | PRN
  Administered 2011-12-12: 25000 [IU] via INTRAVENOUS

## 2011-12-12 MED ORDER — OXYCODONE HCL 5 MG PO TABS
5.0000 mg | ORAL_TABLET | ORAL | Status: DC | PRN
  Administered 2011-12-13: 5 mg via ORAL
  Administered 2011-12-13 (×2): 10 mg via ORAL
  Filled 2011-12-12: qty 1
  Filled 2011-12-12 (×2): qty 2

## 2011-12-12 MED ORDER — 0.9 % SODIUM CHLORIDE (POUR BTL) OPTIME
TOPICAL | Status: DC | PRN
  Administered 2011-12-12: 6000 mL

## 2011-12-12 MED ORDER — SODIUM CHLORIDE 0.9 % IV SOLN
INTRAVENOUS | Status: DC | PRN
  Administered 2011-12-12: 18:00:00 via INTRAVENOUS

## 2011-12-12 MED ORDER — SODIUM CHLORIDE 0.45 % IV SOLN: INTRAVENOUS | Status: DC

## 2011-12-12 MED ORDER — LACTATED RINGERS IV SOLN
INTRAVENOUS | Status: DC | PRN
  Administered 2011-12-12: 14:00:00 via INTRAVENOUS

## 2011-12-12 MED ORDER — LACTATED RINGERS IV SOLN: 500.0000 mL | Freq: Once | INTRAVENOUS | Status: AC | PRN

## 2011-12-12 MED ORDER — VANCOMYCIN HCL IN DEXTROSE 1-5 GM/200ML-% IV SOLN
1000.0000 mg | Freq: Once | INTRAVENOUS | Status: AC
  Administered 2011-12-13: 1000 mg via INTRAVENOUS
  Filled 2011-12-12: qty 200

## 2011-12-12 MED ORDER — THROMBIN 20000 UNITS EX SOLR
CUTANEOUS | Status: AC
  Filled 2011-12-12: qty 20000

## 2011-12-12 MED ORDER — ASPIRIN 81 MG PO CHEW: 324.0000 mg | CHEWABLE_TABLET | Freq: Every day | ORAL | Status: DC

## 2011-12-12 MED ORDER — DOCUSATE SODIUM 100 MG PO CAPS
200.0000 mg | ORAL_CAPSULE | Freq: Every day | ORAL | Status: DC
  Administered 2011-12-13: 200 mg via ORAL
  Filled 2011-12-12: qty 2

## 2011-12-12 MED ORDER — ACETAMINOPHEN 160 MG/5ML PO SOLN
975.0000 mg | Freq: Four times a day (QID) | ORAL | Status: DC
  Filled 2011-12-12: qty 40.6

## 2011-12-12 MED ORDER — ONDANSETRON HCL 4 MG/2ML IJ SOLN
4.0000 mg | Freq: Four times a day (QID) | INTRAMUSCULAR | Status: DC | PRN
  Filled 2011-12-12: qty 2

## 2011-12-12 MED ORDER — ACETAMINOPHEN 500 MG PO TABS
1000.0000 mg | ORAL_TABLET | Freq: Four times a day (QID) | ORAL | Status: DC
  Administered 2011-12-13 (×2): 1000 mg via ORAL
  Filled 2011-12-12 (×5): qty 2

## 2011-12-12 MED ORDER — INSULIN ASPART 100 UNIT/ML ~~LOC~~ SOLN
0.0000 [IU] | SUBCUTANEOUS | Status: DC
  Administered 2011-12-13: 2 [IU] via SUBCUTANEOUS
  Administered 2011-12-13: 4 [IU] via SUBCUTANEOUS

## 2011-12-12 MED ORDER — ACETAMINOPHEN 650 MG RE SUPP
650.0000 mg | RECTAL | Status: AC
  Administered 2011-12-12: 650 mg via RECTAL

## 2011-12-12 MED ORDER — PROTAMINE SULFATE 10 MG/ML IV SOLN
INTRAVENOUS | Status: DC | PRN
  Administered 2011-12-12: 280 mg via INTRAVENOUS

## 2011-12-12 MED ORDER — FENTANYL CITRATE 0.05 MG/ML IJ SOLN
INTRAMUSCULAR | Status: DC | PRN
  Administered 2011-12-12: 1000 ug via INTRAVENOUS
  Administered 2011-12-12: 150 ug via INTRAVENOUS
  Administered 2011-12-12: 200 ug via INTRAVENOUS
  Administered 2011-12-12: 50 ug via INTRAVENOUS
  Administered 2011-12-12 (×2): 100 ug via INTRAVENOUS

## 2011-12-12 MED ORDER — ROCURONIUM BROMIDE 100 MG/10ML IV SOLN
INTRAVENOUS | Status: DC | PRN
  Administered 2011-12-12: 50 mg via INTRAVENOUS

## 2011-12-12 MED ORDER — SODIUM CHLORIDE 0.9 % IJ SOLN
3.0000 mL | Freq: Two times a day (BID) | INTRAMUSCULAR | Status: DC
  Administered 2011-12-13: 3 mL via INTRAVENOUS

## 2011-12-12 MED ORDER — MIDAZOLAM HCL 2 MG/2ML IJ SOLN: 2.0000 mg | INTRAMUSCULAR | Status: DC | PRN

## 2011-12-12 MED ORDER — SODIUM CHLORIDE 0.9 % IV SOLN: INTRAVENOUS | Status: DC

## 2011-12-12 MED ORDER — POTASSIUM CHLORIDE 10 MEQ/50ML IV SOLN
10.0000 meq | INTRAVENOUS | Status: AC
  Administered 2011-12-12 (×3): 10 meq via INTRAVENOUS

## 2011-12-12 MED ORDER — ALBUMIN HUMAN 5 % IV SOLN
250.0000 mL | INTRAVENOUS | Status: DC | PRN
  Administered 2011-12-12: 250 mL via INTRAVENOUS
  Filled 2011-12-12: qty 250

## 2011-12-12 MED ORDER — SODIUM CHLORIDE 0.9 % IJ SOLN: 3.0000 mL | INTRAMUSCULAR | Status: DC | PRN

## 2011-12-12 MED ORDER — PHENYLEPHRINE HCL 10 MG/ML IJ SOLN
0.0000 ug/min | INTRAVENOUS | Status: DC
  Filled 2011-12-12: qty 2

## 2011-12-12 MED ORDER — DEXMEDETOMIDINE HCL IN NACL 200 MCG/50ML IV SOLN: 0.1000 ug/kg/h | INTRAVENOUS | Status: DC

## 2011-12-12 MED ORDER — METOPROLOL TARTRATE 25 MG/10 ML ORAL SUSPENSION
12.5000 mg | Freq: Two times a day (BID) | ORAL | Status: DC
  Filled 2011-12-12 (×3): qty 5

## 2011-12-12 MED ORDER — BISACODYL 5 MG PO TBEC
10.0000 mg | DELAYED_RELEASE_TABLET | Freq: Every day | ORAL | Status: DC
  Administered 2011-12-13: 10 mg via ORAL
  Filled 2011-12-12: qty 2

## 2011-12-12 MED ORDER — MAGNESIUM SULFATE 40 MG/ML IJ SOLN
4.0000 g | Freq: Once | INTRAMUSCULAR | Status: AC
  Administered 2011-12-12: 4 g via INTRAVENOUS
  Filled 2011-12-12: qty 100

## 2011-12-12 MED ORDER — DEXTROSE 5 % IV SOLN
1.5000 g | Freq: Two times a day (BID) | INTRAVENOUS | Status: DC
  Administered 2011-12-12: 1.5 g via INTRAVENOUS
  Filled 2011-12-12 (×3): qty 1.5

## 2011-12-12 MED ORDER — SODIUM CHLORIDE 0.9 % IV SOLN
INTRAVENOUS | Status: DC
  Filled 2011-12-12: qty 1

## 2011-12-12 MED ORDER — MORPHINE SULFATE 2 MG/ML IJ SOLN
2.0000 mg | INTRAMUSCULAR | Status: DC | PRN
  Administered 2011-12-12 (×2): 2 mg via INTRAVENOUS
  Administered 2011-12-13: 4 mg via INTRAVENOUS
  Administered 2011-12-13: 2 mg via INTRAVENOUS
  Administered 2011-12-13: 4 mg via INTRAVENOUS
  Filled 2011-12-12 (×3): qty 1
  Filled 2011-12-12 (×2): qty 2

## 2011-12-12 MED ORDER — NITROGLYCERIN IN D5W 200-5 MCG/ML-% IV SOLN: 0.0000 ug/min | INTRAVENOUS | Status: DC

## 2011-12-12 MED ORDER — SODIUM CHLORIDE 0.9 % IV SOLN: 250.0000 mL | INTRAVENOUS | Status: DC

## 2011-12-12 MED ORDER — METOPROLOL TARTRATE 12.5 MG HALF TABLET
12.5000 mg | ORAL_TABLET | Freq: Two times a day (BID) | ORAL | Status: DC
  Administered 2011-12-13: 12.5 mg via ORAL
  Filled 2011-12-12 (×3): qty 1

## 2011-12-12 MED ORDER — PROPOFOL 10 MG/ML IV EMUL
INTRAVENOUS | Status: DC | PRN
  Administered 2011-12-12: 50 mg via INTRAVENOUS

## 2011-12-12 MED ORDER — ACETAMINOPHEN 160 MG/5ML PO SOLN: 650.0000 mg | ORAL | Status: AC

## 2011-12-12 MED ORDER — VECURONIUM BROMIDE 10 MG IV SOLR
INTRAVENOUS | Status: DC | PRN
  Administered 2011-12-12 (×4): 5 mg via INTRAVENOUS

## 2011-12-12 MED ORDER — THROMBIN 20000 UNITS EX SOLR
OROMUCOSAL | Status: DC | PRN
  Administered 2011-12-12 (×3): via TOPICAL

## 2011-12-12 MED ORDER — ASPIRIN EC 325 MG PO TBEC
325.0000 mg | DELAYED_RELEASE_TABLET | Freq: Every day | ORAL | Status: DC
  Administered 2011-12-13: 325 mg via ORAL
  Filled 2011-12-12: qty 1

## 2011-12-12 MED ORDER — MIDAZOLAM HCL 5 MG/5ML IJ SOLN
INTRAMUSCULAR | Status: DC | PRN
  Administered 2011-12-12: 1 mg via INTRAVENOUS
  Administered 2011-12-12 (×3): 2 mg via INTRAVENOUS
  Administered 2011-12-12: 3 mg via INTRAVENOUS

## 2011-12-12 MED ORDER — SIMVASTATIN 20 MG PO TABS
20.0000 mg | ORAL_TABLET | Freq: Every day | ORAL | Status: DC
  Administered 2011-12-13 – 2011-12-21 (×9): 20 mg via ORAL
  Filled 2011-12-12 (×11): qty 1

## 2011-12-12 MED ORDER — BISACODYL 10 MG RE SUPP: 10.0000 mg | Freq: Every day | RECTAL | Status: DC

## 2011-12-12 MED ORDER — PANTOPRAZOLE SODIUM 40 MG PO TBEC: 40.0000 mg | DELAYED_RELEASE_TABLET | Freq: Every day | ORAL | Status: DC

## 2011-12-12 MED ORDER — METOPROLOL TARTRATE 1 MG/ML IV SOLN: 2.5000 mg | INTRAVENOUS | Status: DC | PRN

## 2011-12-12 SURGICAL SUPPLY — 102 items
ATTRACTOMAT 16X20 MAGNETIC DRP (DRAPES) ×2 IMPLANT
BAG DECANTER FOR FLEXI CONT (MISCELLANEOUS) ×2 IMPLANT
BANDAGE ELASTIC 4 VELCRO ST LF (GAUZE/BANDAGES/DRESSINGS) ×2 IMPLANT
BANDAGE ELASTIC 6 VELCRO ST LF (GAUZE/BANDAGES/DRESSINGS) ×2 IMPLANT
BANDAGE GAUZE ELAST BULKY 4 IN (GAUZE/BANDAGES/DRESSINGS) ×2 IMPLANT
BASKET HEART (ORDER IN 25'S) (MISCELLANEOUS) ×1
BASKET HEART (ORDER IN 25S) (MISCELLANEOUS) ×1 IMPLANT
BLADE STERNUM SYSTEM 6 (BLADE) ×2 IMPLANT
BLADE SURG 11 STRL SS (BLADE) ×2 IMPLANT
CANISTER SUCTION 2500CC (MISCELLANEOUS) ×2 IMPLANT
CATH ROBINSON RED A/P 18FR (CATHETERS) ×4 IMPLANT
CATH THORACIC 28FR (CATHETERS) ×2 IMPLANT
CATH THORACIC 28FR RT ANG (CATHETERS) IMPLANT
CATH THORACIC 36FR (CATHETERS) ×2 IMPLANT
CATH THORACIC 36FR RT ANG (CATHETERS) ×2 IMPLANT
CLIP TI MEDIUM 24 (CLIP) IMPLANT
CLIP TI WIDE RED SMALL 24 (CLIP) ×2 IMPLANT
CLOTH BEACON ORANGE TIMEOUT ST (SAFETY) ×2 IMPLANT
COVER SURGICAL LIGHT HANDLE (MISCELLANEOUS) ×4 IMPLANT
CRADLE DONUT ADULT HEAD (MISCELLANEOUS) ×2 IMPLANT
DRAPE CARDIOVASCULAR INCISE (DRAPES) ×1
DRAPE SLUSH MACHINE 52X66 (DRAPES) IMPLANT
DRAPE SLUSH/WARMER DISC (DRAPES) IMPLANT
DRAPE SRG 135X102X78XABS (DRAPES) ×1 IMPLANT
DRSG COVADERM 4X14 (GAUZE/BANDAGES/DRESSINGS) ×2 IMPLANT
ELECT CAUTERY BLADE 6.4 (BLADE) ×2 IMPLANT
ELECT REM PT RETURN 9FT ADLT (ELECTROSURGICAL) ×4
ELECTRODE REM PT RTRN 9FT ADLT (ELECTROSURGICAL) ×2 IMPLANT
GLOVE BIO SURGEON STRL SZ 6 (GLOVE) ×4 IMPLANT
GLOVE BIO SURGEON STRL SZ 6.5 (GLOVE) ×8 IMPLANT
GLOVE BIO SURGEON STRL SZ7 (GLOVE) ×2 IMPLANT
GLOVE BIO SURGEON STRL SZ7.5 (GLOVE) IMPLANT
GLOVE BIOGEL PI IND STRL 6 (GLOVE) IMPLANT
GLOVE BIOGEL PI IND STRL 6.5 (GLOVE) ×2 IMPLANT
GLOVE BIOGEL PI IND STRL 7.0 (GLOVE) IMPLANT
GLOVE BIOGEL PI INDICATOR 6 (GLOVE)
GLOVE BIOGEL PI INDICATOR 6.5 (GLOVE) ×2
GLOVE BIOGEL PI INDICATOR 7.0 (GLOVE)
GLOVE EUDERMIC 7 POWDERFREE (GLOVE) ×4 IMPLANT
GLOVE ORTHO TXT STRL SZ7.5 (GLOVE) IMPLANT
GOWN PREVENTION PLUS XLARGE (GOWN DISPOSABLE) ×2 IMPLANT
GOWN STRL NON-REIN LRG LVL3 (GOWN DISPOSABLE) ×12 IMPLANT
HEMOSTAT POWDER SURGIFOAM 1G (HEMOSTASIS) ×6 IMPLANT
HEMOSTAT SURGICEL 2X14 (HEMOSTASIS) ×2 IMPLANT
INSERT FOGARTY 61MM (MISCELLANEOUS) IMPLANT
INSERT FOGARTY XLG (MISCELLANEOUS) IMPLANT
KIT BASIN OR (CUSTOM PROCEDURE TRAY) ×2 IMPLANT
KIT CATH CPB BARTLE (MISCELLANEOUS) ×2 IMPLANT
KIT ROOM TURNOVER OR (KITS) ×2 IMPLANT
KIT SUCTION CATH 14FR (SUCTIONS) ×2 IMPLANT
KIT VASOVIEW W/TROCAR VH 2000 (KITS) ×2 IMPLANT
NS IRRIG 1000ML POUR BTL (IV SOLUTION) ×12 IMPLANT
PACK OPEN HEART (CUSTOM PROCEDURE TRAY) ×2 IMPLANT
PAD ARMBOARD 7.5X6 YLW CONV (MISCELLANEOUS) ×4 IMPLANT
PENCIL BUTTON HOLSTER BLD 10FT (ELECTRODE) ×2 IMPLANT
PUNCH AORTIC ROTATE 4.0MM (MISCELLANEOUS) IMPLANT
PUNCH AORTIC ROTATE 4.5MM 8IN (MISCELLANEOUS) ×2 IMPLANT
PUNCH AORTIC ROTATE 5MM 8IN (MISCELLANEOUS) IMPLANT
SET CARDIOPLEGIA MPS 5001102 (MISCELLANEOUS) ×2 IMPLANT
SOLUTION ANTI FOG 6CC (MISCELLANEOUS) ×2 IMPLANT
SPONGE GAUZE 4X4 12PLY (GAUZE/BANDAGES/DRESSINGS) ×2 IMPLANT
SPONGE INTESTINAL PEANUT (DISPOSABLE) IMPLANT
SPONGE LAP 18X18 X RAY DECT (DISPOSABLE) ×2 IMPLANT
SPONGE LAP 4X18 X RAY DECT (DISPOSABLE) ×2 IMPLANT
SUT BONE WAX W31G (SUTURE) ×2 IMPLANT
SUT MNCRL AB 4-0 PS2 18 (SUTURE) ×4 IMPLANT
SUT PROLENE 3 0 SH DA (SUTURE) IMPLANT
SUT PROLENE 3 0 SH1 36 (SUTURE) ×2 IMPLANT
SUT PROLENE 4 0 RB 1 (SUTURE)
SUT PROLENE 4 0 SH DA (SUTURE) IMPLANT
SUT PROLENE 4-0 RB1 .5 CRCL 36 (SUTURE) IMPLANT
SUT PROLENE 5 0 C 1 36 (SUTURE) IMPLANT
SUT PROLENE 6 0 C 1 30 (SUTURE) ×2 IMPLANT
SUT PROLENE 7 0 BV 1 (SUTURE) IMPLANT
SUT PROLENE 7 0 BV1 MDA (SUTURE) ×4 IMPLANT
SUT PROLENE 8 0 BV175 6 (SUTURE) IMPLANT
SUT SILK  1 MH (SUTURE)
SUT SILK 1 MH (SUTURE) IMPLANT
SUT STEEL STERNAL CCS#1 18IN (SUTURE) IMPLANT
SUT STEEL SZ 6 DBL 3X14 BALL (SUTURE) IMPLANT
SUT VIC AB 1 CTX 36 (SUTURE) ×2
SUT VIC AB 1 CTX36XBRD ANBCTR (SUTURE) ×2 IMPLANT
SUT VIC AB 2-0 CT1 27 (SUTURE)
SUT VIC AB 2-0 CT1 36 (SUTURE) ×2 IMPLANT
SUT VIC AB 2-0 CT1 TAPERPNT 27 (SUTURE) IMPLANT
SUT VIC AB 2-0 CTX 27 (SUTURE) IMPLANT
SUT VIC AB 3-0 SH 27 (SUTURE)
SUT VIC AB 3-0 SH 27X BRD (SUTURE) IMPLANT
SUT VIC AB 3-0 X1 27 (SUTURE) IMPLANT
SUT VICRYL 4-0 PS2 18IN ABS (SUTURE) IMPLANT
SUTURE E-PAK OPEN HEART (SUTURE) ×2 IMPLANT
SYSTEM SAHARA CHEST DRAIN ATS (WOUND CARE) ×2 IMPLANT
TAPE CLOTH SURG 4X10 WHT LF (GAUZE/BANDAGES/DRESSINGS) ×2 IMPLANT
TAPE PAPER 2X10 WHT MICROPORE (GAUZE/BANDAGES/DRESSINGS) ×2 IMPLANT
TOWEL NATURAL 10PK STERILE (DISPOSABLE) ×2 IMPLANT
TOWEL OR 17X24 6PK STRL BLUE (TOWEL DISPOSABLE) ×2 IMPLANT
TOWEL OR 17X26 10 PK STRL BLUE (TOWEL DISPOSABLE) ×2 IMPLANT
TRAY FOLEY IC TEMP SENS 14FR (CATHETERS) ×2 IMPLANT
TUBE SUCT INTRACARD DLP 20F (MISCELLANEOUS) ×2 IMPLANT
TUBING INSUFFLATION 10FT LAP (TUBING) ×2 IMPLANT
UNDERPAD 30X30 INCONTINENT (UNDERPADS AND DIAPERS) ×2 IMPLANT
WATER STERILE IRR 1000ML POUR (IV SOLUTION) ×4 IMPLANT

## 2011-12-12 NOTE — Procedures (Signed)
Extubation Procedure Note  Patient Details:   Name: [name] DOB: [birth year] MRN: [id number]   Airway Documentation:   Patient extubated to 4 lpm nasal cannula.  VC 750 ml, NIF -25, patient able to hold head off bed 10 seconds.  Patient able to breathe around deflated cuff and vocalize post procedure.  Tolerated well, no complications noted.   Evaluation  O2 sats: stable throughout Complications: No apparent complications Patient did tolerate procedure well. Bilateral Breath Sounds: Clear;Diminished   Yes  Jasani Lengel, Aloha Gell 12/12/2011, 10:12 PM

## 2011-12-12 NOTE — H&P (Signed)
                 [address]            [place] [address]          [phone]       PCP is Garrett Miller, MD Referring Provider is Garrett A, Fath, MD  Chief Complaint  Patient presents with  . Chest Pain    coronary artery disease...cathed by Dr. Fath 12/03/11....eval for surgery    HPI:  The patient is 70-year-old gentleman with diabetes and known coronary disease status post stenting of his right coronary artery in February 2008 by Dr. Fath. He subsequently had an exercise treadmill test in February of 2009 that showed a left ventricular ejection fraction of 40% with mild global hypokinesis. There was inferolateral scar with no significant reversible ischemia and good exercise tolerance. He now presents with a 4-5 month history of intermittent substernal dull chest pressure and pain associated with shortness of breath that has progressed. He now has episodes while walking around his house. He underwent a myocardial perfusion stress test at the [facility] on July 8 which showed evidence of multivessel coronary disease with reversible ischemia involving the anterior, anterior lateral, lateral, and inferolateral walls as well as the apex. The test was stopped due to decrease in blood pressure and fatigue. The patient had chest pain during the procedure. He underwent cardiac catheterization at [facility] yesterday which showed a 40% ostial left main stenosis. The LAD had a calcified proximal tubular 75% stenosis. There is a very small first diagonal that has 99% stenosis. The left circumflex gave off an intermediate branch that had subtotal occlusion and appeared to be a smal to moderate size vessel. The left circumflex terminated as a moderate-sized third marginal vessel with a 75% stenosis. The right coronary was occluded proximally with filling of the distal vessel by collaterals from the left. Left ventricular function appeared well preserved. There is  no gradient across aortic valve.  Past Medical History  Diagnosis Date  . Diabetes mellitus   . Hyperlipidemia   . CAD (coronary artery disease) 06/03/06    cypher 2.5 x 28mm DES for 80% mid RCA  . H/O cardiac catheterization 06/03/06,12/03/11  . Sleep apnea   . Asthma   . BPH (benign prostatic hypertrophy)   . Anxiety   . Syncope     LAUGHING INDUCED    Past Surgical History  Procedure Date  . Spine surgery 9/05    CERVICAL LAMINECTOMY    History reviewed. No pertinent family history.  Social History History  Substance Use Topics  . Smoking status: Former Smoker    Types: Cigarettes, Cigars    Quit date: 12/03/1977  . Smokeless tobacco: Never Used  . Alcohol Use: No    Current Outpatient Prescriptions  Medication Sig Dispense Refill  . aspirin 81 MG chewable tablet Chew 81 mg by mouth daily.      . fish oil-omega-3 fatty acids 1000 MG capsule Take 1 g by mouth 2 (two) times daily.      . furosemide (LASIX) 40 MG tablet Take 40 mg by mouth daily.      . gabapentin (NEURONTIN) 300 MG capsule Take 300 mg by mouth 2 (two) times daily.      . insulin glargine (LANTUS) 100 UNIT/ML injection Inject 60 Units into the skin 2 (two) times daily.       . Iron-Vitamin C (VITRON-C) 65-125   MG TABS Take by mouth 1 day or 1 dose.      . isosorbide mononitrate (IMDUR) 60 MG 24 hr tablet Take 60 mg by mouth daily.      . lovastatin (MEVACOR) 40 MG tablet Take 40 mg by mouth at bedtime.      . meloxicam (MOBIC) 15 MG tablet Take 7.5 mg by mouth.      . metFORMIN (GLUMETZA) 1000 MG (MOD) 24 hr tablet Take 1,000 mg by mouth 2 (two) times daily with a meal.      . metoprolol tartrate (LOPRESSOR) 25 MG tablet Take 25 mg by mouth 1 day or 1 dose.      . omeprazole (PRILOSEC) 20 MG capsule Take 20 mg by mouth 2 (two) times daily.      . Potassium Aminobenzoate 500 MG CAPS Take by mouth 1 day or 1 dose.        Allergies  Allergen Reactions  . Zithromax (Azithromycin) Anaphylaxis    Swells,  can't breathe    Review of Systems  Constitutional: Positive for activity change and fatigue. Negative for fever, chills, diaphoresis, appetite change and unexpected weight change.  HENT: Negative.   Eyes: Negative.   Respiratory: Positive for chest tightness and shortness of breath.   Cardiovascular: Positive for chest pain and leg swelling.  Gastrointestinal: Positive for constipation.  Genitourinary: Negative.   Musculoskeletal: Positive for arthralgias.  Neurological: Positive for numbness.       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  Hematological: Negative.   Psychiatric/Behavioral: Negative.     BP 150/72  Pulse 73  Resp 16  Ht 5' 8" (1.727 m)  Wt 205 lb (92.987 kg)  BMI 31.17 kg/m2  SpO2 96% Physical Exam  Constitutional: He is oriented to person, place, and time. He appears well-developed and well-nourished. No distress.  HENT:  Head: Normocephalic and atraumatic.  Mouth/Throat: Oropharynx is clear and moist.  Eyes: Conjunctivae and EOM are normal. Pupils are equal, round, and reactive to light.  Neck: Normal range of motion. Neck supple. No JVD present. No tracheal deviation present. No thyromegaly present.  Cardiovascular: Normal rate, regular rhythm and intact distal pulses.  Exam reveals no gallop and no friction rub.   No murmur heard. Pulmonary/Chest: Effort normal and breath sounds normal. No respiratory distress. He has no rales.  Abdominal: Soft. Bowel sounds are normal. He exhibits no distension and no mass. There is no tenderness.  Musculoskeletal: Normal range of motion. He exhibits edema.       Mild in both lower legs  Lymphadenopathy:    He has no cervical adenopathy.  Neurological: He is alert and oriented to person, place, and time. No cranial nerve deficit or sensory deficit.  Skin: Skin is warm and dry.  Psychiatric: He has a normal mood and affect.     Impression/Plan:  He has severe multivessel coronary disease with mild global left ventricular dysfunction  with ejection fraction of 43% by nuclear stress test. He has worsening exertional angina that is limiting his activity and I agree that it would be best to proceed with coronary bypass graft surgery. I discussed the operative procedure with the patient and family including alternatives, benefits and risks; including but not limited to bleeding, blood transfusion, infection, stroke, myocardial infarction, graft failure, heart block requiring a permanent pacemaker, organ dysfunction, and death.  [name] understands and agrees to proceed.  We will schedule surgery for Thursday, 12/12/2011.      blood transfusion, infection, stroke, myocardial infarction, graft failure, heart block requiring a permanent pacemaker, organ dysfunction, and death. [name] understands and agrees to proceed. We will schedule surgery for Thursday, 12/12/2011.

## 2011-12-12 NOTE — Transfer of Care (Signed)
Immediate Anesthesia Transfer of Care Note  Patient: [name]  Procedure(s) Performed: Procedure(s) (LRB): CORONARY ARTERY BYPASS GRAFTING (CABG) (N/A)  Patient Location: PACU  Anesthesia Type: General  Level of Consciousness: unresponsive and Patient remains intubated per anesthesia plan  Airway & Oxygen Therapy: Patient remains intubated per anesthesia plan and Patient placed on Ventilator (see vital sign flow sheet for setting)  Post-op Assessment: Post -op Vital signs reviewed and stable  Post vital signs: Reviewed and stable  Complications: No apparent anesthesia complications

## 2011-12-12 NOTE — Progress Notes (Signed)
Called Dr. Michelle Piper to check on whether to give Lopressor or not due to BP 111/70 and wife states BP can drop fast.   Order received to hold Lopressor. Note placed on chart as well.

## 2011-12-12 NOTE — Plan of Care (Signed)
Problem: Phase II Progression Outcomes Goal: Patient extubated within - Outcome: Completed/Met Date Met:  12/12/11 Extubated within 6 hours

## 2011-12-12 NOTE — Anesthesia Preprocedure Evaluation (Addendum)
Anesthesia Evaluation  Patient identified by MRN, date of birth, ID band Patient awake    Reviewed: Allergy & Precautions, H&P , NPO status , Patient's Chart, lab work & pertinent test results  Airway Mallampati: I TM Distance: >3 FB Neck ROM: Full    Dental  (+) Edentulous Upper, Edentulous Lower and Dental Advisory Given   Pulmonary asthma , sleep apnea ,          Cardiovascular hypertension, Pt. on medications + CAD, + Past MI, + Cardiac Stents and +CHF     Neuro/Psych PSYCHIATRIC DISORDERS Anxiety  Neuromuscular disease    GI/Hepatic GERD-  Medicated,  Endo/Other  Type 2, Insulin Dependent  Renal/GU      Musculoskeletal   Abdominal   Peds  Hematology   Anesthesia Other Findings   Reproductive/Obstetrics                         Anesthesia Physical Anesthesia Plan  ASA: III  Anesthesia Plan: General   Post-op Pain Management:    Induction: Intravenous  Airway Management Planned: Oral ETT  Additional Equipment: Arterial line, CVP, PA Cath and Ultrasound Guidance Line Placement  Intra-op Plan:   Post-operative Plan: Post-operative intubation/ventilation  Informed Consent: I have reviewed the patients History and Physical, chart, labs and discussed the procedure including the risks, benefits and alternatives for the proposed anesthesia with the patient or authorized representative who has indicated his/her understanding and acceptance.     Plan Discussed with: CRNA and Surgeon  Anesthesia Plan Comments:        Anesthesia Quick Evaluation

## 2011-12-12 NOTE — Anesthesia Postprocedure Evaluation (Signed)
  Anesthesia Post-op Note  Patient: [name]  Procedure(s) Performed: Procedure(s) (LRB): CORONARY ARTERY BYPASS GRAFTING (CABG) (N/A)  Patient Location: SICU  Anesthesia Type: General  Level of Consciousness: sedated, unresponsive and Patient remains intubated per anesthesia plan  Airway and Oxygen Therapy: Patient remains intubated per anesthesia plan and Patient placed on Ventilator (see vital sign flow sheet for setting)  Post-op Pain: none  Post-op Assessment: Post-op Vital signs reviewed and Patient's Cardiovascular Status Stable  Post-op Vital Signs: Reviewed and stable  Complications: No apparent anesthesia complications

## 2011-12-12 NOTE — Progress Notes (Signed)
Patient ID: ZIAH LEANDRO, male   DOB: 10-19-1940, 71 y.o.   MRN: [id number]  Filed Vitals:   12/12/11 2200 12/12/11 2210 12/12/11 2215 12/12/11 2230  BP: 111/72  104/67 101/65  Pulse: 90 90 90 90  Temp: 97.3 F (36.3 C) 97.3 F (36.3 C) 97.5 F (36.4 C) 97.5 F (36.4 C)  TempSrc:      Resp: 14 17 16 16   Weight:      SpO2: 100% 99% 100% 100%   Extubated  Urine output good CT output low  CBC    Component Value Date/Time   WBC 10.6* 12/12/2011 1925   RBC 3.56* 12/12/2011 1925   HGB 8.9* 12/12/2011 1925   HGB 8.8* 12/12/2011 1925   HCT 28.7* 12/12/2011 1925   HCT 26.0* 12/12/2011 1925   PLT 122* 12/12/2011 1925   MCV 80.6 12/12/2011 1925   MCH 25.0* 12/12/2011 1925   MCHC 31.0 12/12/2011 1925   RDW 16.6* 12/12/2011 1925    BMET    Component Value Date/Time   NA 143 12/12/2011 1925   K 3.6 12/12/2011 1925   CL 103 12/10/2011 1339   CO2 29 12/10/2011 1339   GLUCOSE 113* 12/12/2011 1925   BUN 14 12/10/2011 1339   CREATININE 0.93 12/10/2011 1339   CALCIUM 9.6 12/10/2011 1339   GFRNONAA 83* 12/10/2011 1339   GFRAA >90 12/10/2011 1339    A/P:  stable

## 2011-12-12 NOTE — Brief Op Note (Signed)
12/12/2011  5:09 PM  PATIENT:  [name]  71 y.o. male  PRE-OPERATIVE DIAGNOSIS:  CAD  POST-OPERATIVE DIAGNOSIS:  CAD  PROCEDURE:  Procedure(s) (LRB):  CORONARY ARTERY BYPASS GRAFTING x4  LIMA to LAD  SVG to Ramus Intermediate  SVG to Distal Left Circumflex  SVG to RCA  ENDOSCOPIC SAPHENOUS VEIN HARVEST RIGHT LEG  SURGEON:  Surgeon(s) and Role:    * Alleen Borne, MD - Primary  PHYSICIAN ASSISTANT: Erin Barrett PA-C  ANESTHESIA:   general  EBL:  Total I/O In: 1900 [I.V.:1900] Out: 700 [Urine:700]  BLOOD ADMINISTERED: CC CELLSAVER  DRAINS: Left pleural chest tube, mediastinal chest drains   LOCAL MEDICATIONS USED:  NONE  SPECIMEN:  No Specimen  DISPOSITION OF SPECIMEN:  N/A  COUNTS:  YES  TOURNIQUET:  * No tourniquets in log *  DICTATION: .Dragon Dictation  PLAN OF CARE: Admit to inpatient   PATIENT DISPOSITION:  ICU - intubated and hemodynamically stable.   Delay start of Pharmacological VTE agent (>24hrs) due to surgical blood loss or risk of bleeding: yes

## 2011-12-12 NOTE — Interval H&P Note (Signed)
History and Physical Interval Note:  12/12/2011 1:00 PM  [name]  has presented today for surgery, with the diagnosis of CAD  The various methods of treatment have been discussed with the patient and family. After consideration of risks, benefits and other options for treatment, the patient has consented to  Procedure(s) (LRB): CORONARY ARTERY BYPASS GRAFTING (CABG) (N/A) as a surgical intervention .  The patient's history has been reviewed, patient examined, no change in status, stable for surgery.  I have reviewed the patient's chart and labs.  Questions were answered to the patient's satisfaction.     [name]

## 2011-12-13 ENCOUNTER — Inpatient Hospital Stay (HOSPITAL_COMMUNITY): Payer: Medicare Other

## 2011-12-13 ENCOUNTER — Encounter (HOSPITAL_COMMUNITY): Payer: Self-pay | Admitting: Surgery

## 2011-12-13 LAB — CBC
HCT: 29.4 % — ABNORMAL LOW (ref 39.0–52.0)
Hemoglobin: 9 g/dL — ABNORMAL LOW (ref 13.0–17.0)
MCH: 25.1 pg — ABNORMAL LOW (ref 26.0–34.0)
MCHC: 30.6 g/dL (ref 30.0–36.0)
MCV: 80.6 fL (ref 78.0–100.0)
Platelets: 122 10*3/uL — ABNORMAL LOW (ref 150–400)
RDW: 16.9 % — ABNORMAL HIGH (ref 11.5–15.5)
RDW: 17.3 % — ABNORMAL HIGH (ref 11.5–15.5)
WBC: 7.4 10*3/uL (ref 4.0–10.5)

## 2011-12-13 LAB — GLUCOSE, CAPILLARY
Glucose-Capillary: 147 mg/dL — ABNORMAL HIGH (ref 70–99)
Glucose-Capillary: 170 mg/dL — ABNORMAL HIGH (ref 70–99)
Glucose-Capillary: 171 mg/dL — ABNORMAL HIGH (ref 70–99)
Glucose-Capillary: 81 mg/dL (ref 70–99)
Glucose-Capillary: 90 mg/dL (ref 70–99)
Glucose-Capillary: 146 mg/dL — ABNORMAL HIGH (ref 70–99)
Glucose-Capillary: 183 mg/dL — ABNORMAL HIGH (ref 70–99)
Glucose-Capillary: 257 mg/dL — ABNORMAL HIGH (ref 70–99)

## 2011-12-13 LAB — MAGNESIUM
Magnesium: 2.7 mg/dL — ABNORMAL HIGH (ref 1.5–2.5)
Magnesium: 2.5 mg/dL (ref 1.5–2.5)

## 2011-12-13 LAB — CREATININE, SERUM: GFR calc non Af Amer: 69 mL/min — ABNORMAL LOW (ref >90)

## 2011-12-13 LAB — BASIC METABOLIC PANEL
Calcium: 8.2 mg/dL — ABNORMAL LOW (ref 8.4–10.5)
Chloride: 111 mEq/L (ref 96–112)
Creatinine, Ser: 0.87 mg/dL (ref 0.50–1.35)
GFR calc Af Amer: >90 mL/min (ref >90)

## 2011-12-13 MED ORDER — BISACODYL 5 MG PO TBEC: 10.0000 mg | DELAYED_RELEASE_TABLET | Freq: Every day | ORAL | Status: DC | PRN

## 2011-12-13 MED ORDER — PANTOPRAZOLE SODIUM 40 MG PO TBEC
40.0000 mg | DELAYED_RELEASE_TABLET | Freq: Every day | ORAL | Status: DC
  Administered 2011-12-14 – 2011-12-22 (×9): 40 mg via ORAL
  Filled 2011-12-13 (×9): qty 1

## 2011-12-13 MED ORDER — INSULIN ASPART 100 UNIT/ML ~~LOC~~ SOLN
0.0000 [IU] | SUBCUTANEOUS | Status: DC
  Administered 2011-12-13 – 2011-12-14 (×3): 12 [IU] via SUBCUTANEOUS
  Administered 2011-12-14: 4 [IU] via SUBCUTANEOUS
  Administered 2011-12-14 (×2): 8 [IU] via SUBCUTANEOUS
  Administered 2011-12-14: 4 [IU] via SUBCUTANEOUS
  Administered 2011-12-15: 8 [IU] via SUBCUTANEOUS
  Administered 2011-12-15: 4 [IU] via SUBCUTANEOUS
  Administered 2011-12-15: 8 [IU] via SUBCUTANEOUS
  Administered 2011-12-15: 4 [IU] via SUBCUTANEOUS
  Administered 2011-12-15 – 2011-12-16 (×3): 8 [IU] via SUBCUTANEOUS
  Administered 2011-12-16 – 2011-12-17 (×4): 4 [IU] via SUBCUTANEOUS
  Administered 2011-12-17: 2 [IU] via SUBCUTANEOUS
  Administered 2011-12-17 (×2): 4 [IU] via SUBCUTANEOUS
  Administered 2011-12-17 – 2011-12-18 (×3): 2 [IU] via SUBCUTANEOUS

## 2011-12-13 MED ORDER — FUROSEMIDE 10 MG/ML IJ SOLN
40.0000 mg | Freq: Once | INTRAMUSCULAR | Status: AC
  Administered 2011-12-13: 40 mg via INTRAVENOUS

## 2011-12-13 MED ORDER — MOVING RIGHT ALONG BOOK
Freq: Once | Status: AC
  Administered 2011-12-13: 18:00:00
  Filled 2011-12-13: qty 1

## 2011-12-13 MED ORDER — CHLORHEXIDINE GLUCONATE CLOTH 2 % EX PADS
6.0000 | MEDICATED_PAD | Freq: Every day | CUTANEOUS | Status: AC
  Administered 2011-12-14 – 2011-12-16 (×3): 6 via TOPICAL

## 2011-12-13 MED ORDER — ONDANSETRON HCL 4 MG/2ML IJ SOLN: 4.0000 mg | Freq: Four times a day (QID) | INTRAMUSCULAR | Status: DC | PRN

## 2011-12-13 MED ORDER — INSULIN GLARGINE 100 UNIT/ML ~~LOC~~ SOLN
30.0000 [IU] | Freq: Two times a day (BID) | SUBCUTANEOUS | Status: DC
  Administered 2011-12-13 (×2): 30 [IU] via SUBCUTANEOUS

## 2011-12-13 MED ORDER — BISACODYL 10 MG RE SUPP
10.0000 mg | Freq: Every day | RECTAL | Status: DC | PRN
  Filled 2011-12-13: qty 1

## 2011-12-13 MED ORDER — TRAMADOL HCL 50 MG PO TABS
50.0000 mg | ORAL_TABLET | ORAL | Status: DC | PRN
  Administered 2011-12-13: 50 mg via ORAL
  Administered 2011-12-14 – 2011-12-19 (×14): 100 mg via ORAL
  Administered 2011-12-19: 50 mg via ORAL
  Administered 2011-12-19: 100 mg via ORAL
  Administered 2011-12-20: 50 mg via ORAL
  Administered 2011-12-20 – 2011-12-21 (×4): 100 mg via ORAL
  Administered 2011-12-21 – 2011-12-22 (×3): 50 mg via ORAL
  Filled 2011-12-13 (×4): qty 2
  Filled 2011-12-13: qty 1
  Filled 2011-12-13 (×9): qty 2
  Filled 2011-12-13: qty 1
  Filled 2011-12-13 (×6): qty 2
  Filled 2011-12-13 (×2): qty 1
  Filled 2011-12-13: qty 2
  Filled 2011-12-13: qty 1

## 2011-12-13 MED ORDER — ONDANSETRON HCL 4 MG PO TABS: 4.0000 mg | ORAL_TABLET | Freq: Four times a day (QID) | ORAL | Status: DC | PRN

## 2011-12-13 MED ORDER — SODIUM CHLORIDE 0.9 % IV SOLN: 250.0000 mL | INTRAVENOUS | Status: DC | PRN

## 2011-12-13 MED ORDER — OXYCODONE HCL 5 MG PO TABS
5.0000 mg | ORAL_TABLET | ORAL | Status: DC | PRN
  Administered 2011-12-13 – 2011-12-14 (×3): 10 mg via ORAL
  Administered 2011-12-14: 5 mg via ORAL
  Administered 2011-12-14: 10 mg via ORAL
  Administered 2011-12-14 – 2011-12-15 (×2): 5 mg via ORAL
  Filled 2011-12-13 (×8): qty 2

## 2011-12-13 MED ORDER — POTASSIUM CHLORIDE CRYS ER 20 MEQ PO TBCR
40.0000 meq | EXTENDED_RELEASE_TABLET | Freq: Every day | ORAL | Status: DC
  Administered 2011-12-13: 40 meq via ORAL
  Filled 2011-12-13: qty 2

## 2011-12-13 MED ORDER — SODIUM CHLORIDE 0.9 % IJ SOLN
3.0000 mL | INTRAMUSCULAR | Status: DC | PRN
  Administered 2011-12-15: 3 mL via INTRAVENOUS

## 2011-12-13 MED ORDER — MUPIROCIN 2 % EX OINT
1.0000 "application " | TOPICAL_OINTMENT | Freq: Two times a day (BID) | CUTANEOUS | Status: AC
  Administered 2011-12-13 – 2011-12-17 (×10): 1 via NASAL
  Filled 2011-12-13: qty 22

## 2011-12-13 MED ORDER — DOCUSATE SODIUM 100 MG PO CAPS
200.0000 mg | ORAL_CAPSULE | Freq: Every day | ORAL | Status: DC
  Administered 2011-12-14 – 2011-12-22 (×8): 200 mg via ORAL
  Filled 2011-12-13 (×9): qty 2

## 2011-12-13 MED ORDER — GABAPENTIN 300 MG PO CAPS
300.0000 mg | ORAL_CAPSULE | Freq: Two times a day (BID) | ORAL | Status: DC
  Administered 2011-12-13 – 2011-12-22 (×18): 300 mg via ORAL
  Filled 2011-12-13 (×22): qty 1

## 2011-12-13 MED ORDER — ACETAMINOPHEN 325 MG PO TABS
650.0000 mg | ORAL_TABLET | Freq: Four times a day (QID) | ORAL | Status: DC | PRN
  Administered 2011-12-14 – 2011-12-17 (×3): 650 mg via ORAL
  Filled 2011-12-13 (×3): qty 2

## 2011-12-13 MED ORDER — ASPIRIN EC 325 MG PO TBEC
325.0000 mg | DELAYED_RELEASE_TABLET | Freq: Every day | ORAL | Status: DC
  Administered 2011-12-13 – 2011-12-22 (×10): 325 mg via ORAL
  Filled 2011-12-13 (×10): qty 1

## 2011-12-13 MED ORDER — METOPROLOL TARTRATE 25 MG PO TABS
25.0000 mg | ORAL_TABLET | Freq: Two times a day (BID) | ORAL | Status: DC
  Administered 2011-12-13 – 2011-12-22 (×18): 25 mg via ORAL
  Filled 2011-12-13 (×19): qty 1

## 2011-12-13 MED ORDER — SODIUM CHLORIDE 0.9 % IJ SOLN
3.0000 mL | Freq: Two times a day (BID) | INTRAMUSCULAR | Status: DC
  Administered 2011-12-13 – 2011-12-22 (×14): 3 mL via INTRAVENOUS

## 2011-12-13 MED FILL — Potassium Chloride Inj 2 mEq/ML: INTRAVENOUS | Qty: 40 | Status: AC

## 2011-12-13 MED FILL — Magnesium Sulfate Inj 50%: INTRAMUSCULAR | Qty: 10 | Status: AC

## 2011-12-13 NOTE — Care Management Note (Signed)
    Page 1 of 1   12/22/2011     3:28:07 PM   CARE MANAGEMENT NOTE 12/22/2011  Patient:  [name], [name]   Account Number:  [id number]  Date Initiated:  12/13/2011  Documentation initiated by:  SIMMONS,CRYSTAL  Subjective/Objective Assessment:   ADMITTED WITH CAD; L[place]- E[place]- [place]; WAS [place]; ALREADY HAS RW, CANE; USES [facility] IN [place] FOR RX.     Action/Plan:   DISCHARGE PLANNING DISCUSSED AT BEDSIDE; WANTS [facility] FOR HH AT D/C IF NEEDED.   Anticipated DC Date:  12/16/2011   Anticipated DC Plan:  HOME W HOME HEALTH SERVICES      DC Planning Services  CM consult      [facility] Choice  HOME HEALTH   Choice offered to / List presented to:  C-1 Patient        HH arranged  HH-1 RN  HH-2 PT  HH-3 OT      [facility] agency  [facility]   Status of service:  Completed, signed off Medicare Important Message given?   (If response is "NO", the following Medicare IM given date fields will be blank) Date Medicare IM given:   Date Additional Medicare IM given:    Discharge Disposition:  HOME W HOME HEALTH SERVICES  Per UR Regulation:  Reviewed for med. necessity/level of care/duration of stay  If discussed at Long Length of Stay Meetings, dates discussed:   12/19/2011    Comments:  12/22/11 15;25 Referral received for shower stool, attempted to see patient who already had been discharged.  Per nursing note pt is going to borrow a shower stool from a family member.  Jim Like RN CCM [facility]  12/20/11  1134  CRYSTAL SIMMONS RN, BSN [phone] NCM DISCUSSED POSSIBLE D/C TO ST SNF / CIR DUE TO SLOW PROGRESSION; HE AND WIFE WERE RELUCTANT BUT AGREEABLE ; AWAITING PT/OT EVALS TO ASSIST WITH DISCHARGE PLANNING; NCM WILL FOLLOW.  12/19/11  1040  CRYSTAL SIMMONS RN, BSN [phone] REFERRAL PLACED TO [facility] WITH [facility] FOR HHRN/PT/OT PER CHOICE; SOC DATE: WITHIN 24-48HRS POST D/C; NCM WILL FOLLOW.  12/18/11  1136  CRYSTAL SIMMONS RN, BSN [phone]  Was not able to tolerate  walk as well today. Pt c/o pain between shoulder blades, pressure in chest that was not allowing deep breath and dizziness. 1. Steady progress, needs aggressive pulm rx/toilet cont. Wean O2, nebs were added. 2. cbg's better control 3. Repeat lytes 4 mobilize as able 5 add milk of mag for constipation.    12/13/11  1329  CRYSTAL SIMMONS RN, BSN [phone] NCM WILL FOLLOW.

## 2011-12-13 NOTE — Progress Notes (Signed)
Patient transferred from Unit 2300 to Unit 2000 room 2034. Receiving nurse given report. Patient ambulated to new unit. Tolerated ambulation well. Patient settled. Patient chart, personal belongings, and medications given to receiving nurse. Family present during transport.   Kathlene Cote A RN

## 2011-12-13 NOTE — Progress Notes (Signed)
1 Day Post-Op Procedure(s) (LRB): CORONARY ARTERY BYPASS GRAFTING (CABG) (N/A) Subjective: No complaints  Objective: Vital signs in last 24 hours: Temp:  [96.8 F (36 C)-100.4 F (38 C)] 100 F (37.8 C) (08/23 0800) Pulse Rate:  [73-90] 80  (08/23 0800) Cardiac Rhythm:  [-] Normal sinus rhythm (08/23 0800) Resp:  [0-22] 14  (08/23 0800) BP: (85-127)/(43-75) 118/70 mmHg (08/23 0800) SpO2:  [97 %-100 %] 99 % (08/23 0800) Arterial Line BP: (106-162)/(42-71) 137/47 mmHg (08/23 0800) FiO2 (%):  [4 %-50 %] 4 % (08/23 0000) Weight:  [96.9 kg (213 lb 10 oz)] 96.9 kg (213 lb 10 oz) (08/23 0526)  Hemodynamic parameters for last 24 hours: PAP: (24-38)/(7-26) 28/14 mmHg CO:  [3.9 L/min-6 L/min] 5.4 L/min CI:  [1.9 L/min/m2-2.9 L/min/m2] 2.7 L/min/m2  Intake/Output from previous day: 08/22 0701 - 08/23 0700 In: 5098.1 [I.V.:3768.1; Blood:530; NG/GT:20; IV Piggyback:780] Out: 4880 [Urine:2890; Emesis/NG output:50; Blood:1610; Chest Tube:330] Intake/Output this shift: Total I/O In: 40 [I.V.:40] Out: 60 [Urine:30; Chest Tube:30]  General appearance: alert and cooperative Neurologic: intact Heart: regular rate and rhythm, S1, S2 normal, no murmur, click, rub or gallop Lungs: clear to auscultation bilaterally Abdomen: soft, non-tender; bowel sounds normal; no masses,  no organomegaly Extremities: edema mild Wound: dressing dry  Lab Results:  Basename 12/13/11 0400 12/12/11 1925  WBC 7.4 10.6*  HGB 8.8* 8.9*8.8*  HCT 28.3* 28.7*26.0*  PLT 122* 122*   BMET:  Basename 12/13/11 0400 12/12/11 1925 12/10/11 1339  NA 143 143 --  K 4.4 3.6 --  CL 111 -- 103  CO2 25 -- 29  GLUCOSE 195* 113* --  BUN 14 -- 14  CREATININE 0.87 -- 0.93  CALCIUM 8.2* -- 9.6    PT/INR:  Basename 12/12/11 1925  LABPROT 17.5*  INR 1.41   ABG    Component Value Date/Time   PHART 7.374 12/12/2011 2304   HCO3 25.0* 12/12/2011 2304   TCO2 26 12/12/2011 2304   ACIDBASEDEF 1.0 12/12/2011 2201   O2SAT  99.0 12/12/2011 2304   CBG (last 3)   Basename 12/13/11 0757 12/13/11 0401 12/13/11 0216  GLUCAP 146* 171* 170*    Assessment/Plan: S/P Procedure(s) (LRB): CORONARY ARTERY BYPASS GRAFTING (CABG) (N/A) Mobilize Diuresis Diabetes control d/c tubes/lines Plan for transfer to step-down: see transfer orders   LOS: 1 day    BARTLE,BRYAN K 12/13/2011

## 2011-12-14 LAB — BASIC METABOLIC PANEL
BUN: 23 mg/dL (ref 6–23)
Calcium: 8.7 mg/dL (ref 8.4–10.5)
Chloride: 104 mEq/L (ref 96–112)
Creatinine, Ser: 1.19 mg/dL (ref 0.50–1.35)
GFR calc Af Amer: 70 mL/min — ABNORMAL LOW (ref >90)
GFR calc non Af Amer: 60 mL/min — ABNORMAL LOW (ref >90)

## 2011-12-14 LAB — CBC
HCT: 29.5 % — ABNORMAL LOW (ref 39.0–52.0)
MCHC: 30.5 g/dL (ref 30.0–36.0)
MCV: 81.9 fL (ref 78.0–100.0)
Platelets: 126 10*3/uL — ABNORMAL LOW (ref 150–400)
RDW: 17.5 % — ABNORMAL HIGH (ref 11.5–15.5)
WBC: 11.5 10*3/uL — ABNORMAL HIGH (ref 4.0–10.5)

## 2011-12-14 LAB — GLUCOSE, CAPILLARY
Glucose-Capillary: 188 mg/dL — ABNORMAL HIGH (ref 70–99)
Glucose-Capillary: 194 mg/dL — ABNORMAL HIGH (ref 70–99)
Glucose-Capillary: 268 mg/dL — ABNORMAL HIGH (ref 70–99)
Glucose-Capillary: 294 mg/dL — ABNORMAL HIGH (ref 70–99)

## 2011-12-14 MED ORDER — METFORMIN HCL 500 MG PO TABS
500.0000 mg | ORAL_TABLET | Freq: Two times a day (BID) | ORAL | Status: DC
  Filled 2011-12-14 (×2): qty 1

## 2011-12-14 MED ORDER — FERROUS SULFATE 325 (65 FE) MG PO TABS
325.0000 mg | ORAL_TABLET | Freq: Every day | ORAL | Status: DC
  Administered 2011-12-14 – 2011-12-22 (×9): 325 mg via ORAL
  Filled 2011-12-14 (×10): qty 1

## 2011-12-14 MED ORDER — INSULIN GLARGINE 100 UNIT/ML ~~LOC~~ SOLN
10.0000 [IU] | Freq: Two times a day (BID) | SUBCUTANEOUS | Status: DC
  Administered 2011-12-14 (×2): 10 [IU] via SUBCUTANEOUS

## 2011-12-14 MED ORDER — METFORMIN HCL 500 MG PO TABS
500.0000 mg | ORAL_TABLET | Freq: Two times a day (BID) | ORAL | Status: DC
  Administered 2011-12-14 – 2011-12-15 (×2): 500 mg via ORAL
  Filled 2011-12-14 (×4): qty 1

## 2011-12-14 MED ORDER — POTASSIUM CHLORIDE CRYS ER 20 MEQ PO TBCR
20.0000 meq | EXTENDED_RELEASE_TABLET | Freq: Every day | ORAL | Status: AC
  Administered 2011-12-14 – 2011-12-15 (×2): 20 meq via ORAL
  Filled 2011-12-14: qty 1

## 2011-12-14 MED ORDER — FUROSEMIDE 40 MG PO TABS
40.0000 mg | ORAL_TABLET | Freq: Every day | ORAL | Status: DC
  Administered 2011-12-14 – 2011-12-22 (×9): 40 mg via ORAL
  Filled 2011-12-14 (×9): qty 1

## 2011-12-14 NOTE — Op Note (Signed)
NAME:  [name], [name] NO.:  [id number]  MEDICAL RECORD NO.:  [id number]  LOCATION:  2034                         FACILITY:  [facility]  PHYSICIAN:  Evelene Croon, M.D.     DATE OF BIRTH:  15-Apr-1941  DATE OF PROCEDURE:  12/12/2011 DATE OF DISCHARGE:                              OPERATIVE REPORT   PREOPERATIVE DIAGNOSIS:  Severe multivessel coronary disease.  POSTOPERATIVE DIAGNOSIS:  Severe multivessel coronary disease.  OPERATIVE PROCEDURE:  Median sternotomy, extracorporeal circulation, coronary artery bypass graft surgery x4 using a left internal mammary artery graft and left anterior descending coronary artery, with a saphenous vein graft to the intermediate coronary artery, the distal left circumflex coronary artery, and the right coronary artery. Endoscopic vein harvesting from the right leg.  ATTENDING SURGEON:  Evelene Croon, M.D.  ASSISTANT:  Pauline Good, PA-C.  ANESTHESIA:  General endotracheal.  CLINICAL HISTORY:  This patient is a 71 year old gentleman with history of diabetes and known coronary disease, status post stenting of his right coronary artery in February 2008 by Dr. Lady Gary.  He subsequently had an exercise treadmill test in February of 2009, which showed an ejection fraction of 40% with mild global hypokinesis.  There is inferolateral scar with no significant reversible ischemia and good exercise tolerance.  He now presents with a 4-5 month history of intermittent substernal dull chest pressure and pain, associated shortness of breath that progressed.  He has had episodes while walking around his house.  A myocardial perfusion stress test at [facility] on October 28, 2011 showed evidence of multivessel coronary disease with reversible ischemia involving the anterior, anterolateral, lateral, and inferolateral walls as well as the apex.  The test was stopped due to his decreased blood pressure and fatigue.  He did have chest pain  during the procedure.  Cardiac catheterizations at [facility] last week showed a 40% ostial left main stenosis.  The LAD had a calcified proximal tubular 75% stenosis.  There is a very small first diagonal that had 99% stenosis.  Left circumflex gave off an intermediate branch that was subtotally occluded and appeared to be a small moderate-sized vessel, although, almost down towards the apex. Left circumflex terminated as a moderate-sized third marginal vessel with a 75% stenosis.  The right coronary artery was occluded proximally with filling of the distal vessel by collaterals from the left.  Left ventricular ejection fraction was preserved.  There is no gradient across the aortic valve.  After review of the catheterizations and examination of the patient, it was felt that coronary artery bypass graft surgery is the best treatment for him to prevent further ischemia, infarction, and improve his quality of life.  I discussed the operative procedure with the patient and his wife.  We discussed alternatives, benefits, and risks including, but not limited to, bleeding, blood transfusion, infection, stroke, myocardial infarction, graft failure, and death.  He understood and agreed to proceed.  OPERATIVE PROCEDURE:  The patient was taken to the operative room and placed on table in supine position.  After induction of general endotracheal anesthesia, a Foley catheter was placed in bladder using sterile technique.  Then, the chest, abdomen, and both lower  extremities were prepped and draped in usual sterile manner.  The chest was entered through a median sternotomy incision and the pericardium opened midline. Examination of the heart showed good ventricular contractility.  There was large amount of epicardial fat.  The ascending aorta was of normal size and had no palpable plaques in it.  Then, the left internal mammary artery was harvested from the chest wall as  pedicle graft.  This is a medium caliber vessel with excellent blood flow through it.  At the same time, a segment of greater saphenous vein was harvested from the right leg using endoscopic vein harvest technique.  This vein was of medium size and good quality.  Then, the patient was heparinized when an adequate ACT was obtained. The distal ascending aorta was cannulated using a 20-French aortic cannula for arterial inflow.  Venous outflow was achieved using a two- stage venous cannula through the right atrial appendage.  An antegrade cardioplegia and vent cannula was inserted in the aortic root.  The patient placed on cardiopulmonary bypass and distal coronaries were identified.  The LAD was a moderate-sized vessel distally where it had some segmental plaque and was lying deep in the epicardial fat.  The intermediate vessel was visible proximally where it was heavily diseased.  It was intramyocardial throughout most of its extent.  It was traced from proximal into the muscle and muscle became a soft graftable vessel of moderate size.  The distal left circumflex third marginal branch was a small-to-moderate size graftable vessel with no distal disease in it.  The right coronary artery gave off a small-to-moderate sized posterior descending branch and located the right coronary artery just before the takeoff of the posterior descending branch and this was a moderate-sized graftable vessel.  Then, the aorta was crossclamped and 500 mL of cold blood antegrade cardioplegia was administered in the aortic root with quick arrest the heart.  Systemic hypothermia to 32 degrees centigrade and topical hypothermia with iced saline was used.  A temperature probe was placed in septum insulating pad in the pericardium.  The first distal anastomosis was performed to the distal right coronary artery just before the takeoff of the posterior descending branch.  The internal diameter here was about 1.75  mm.  The conduit used was a segment of greater saphenous vein.  The anastomosis was performed end-to- side manner using continuous 7-0 Prolene suture.  Flow was noted through the graft was excellent.  Then, another dose of cardioplegia given down the vein graft.  The second distal anastomosis was performed to the distal left circumflex third marginal branch.  The internal diameter of this vessel was about 1.6 mm.  The conduit used was a segment of greater saphenous vein.  The anastomosis performed in an end-to-side manner using continuous 7-0 Prolene suture.  Flow was noted through the graft and was excellent.  The third distal anastomosis was performed to the intermediate vessel. The internal diameter of 1.6 mm.  The conduit used was a third segment of greater saphenous vein and anastomosis performed end-to-side manner using continuous 7-0 Prolene suture.  Flow was noted through the graft and was excellent.  Then, a dose of cardioplegia was given down vein grafts and aortic root.  The fourth distal anastomosis was performed to the distal LAD.  The internal diameter was about 1.6 mm.  The conduit used was a left internal mammary graft, was brought through an opening of left pericardium anterior to the phrenic nerve.  It was anastomosed to  the LAD in an end-to-side manner using continuous 8-0 Prolene suture. Pedicle was sutured to the epicardium with 6-0 Prolene sutures.  The patient was then rewarmed to 37 degrees centigrade.  Another dose of cardioplegia was given.  With crossclamp in place, the three proximal vein graft anastomoses were performed to the mid ascending aorta in end- to-side manner using continuous 6-0 Prolene suture.  Then, the clamp was moved mammary pedicle.  There was rapid warming of the ventricular septum and return of spontaneous ventricular fibrillation.  The crossclamp was removed with a time of 96 minutes.  There was spontaneous return of ventricular  fibrillation.  The patient was defibrillated into sinus rhythm.  The proximal and distal anastomoses appeared hemostatic while the grafts satisfactory.  Graft marker was placed around the proximal anastomoses.  Two temporary right ventricular and right atrial pacing wires placed and brought through the skin.  The patient rewarmed to 37 degrees centigrade.  He was weaned from cardiopulmonary bypass on no inotropic agents.  Total bypass time was 121 minute.  Cardiac function appeared excellent and a cardiac output of 6 L/minute.  Protamine was given, and the venous and aortic cannulas were removed without difficulty.  Hemostasis was achieved.  Four chest tubes were placed with two in the post pericardium, one left pleural space, one in the anterior mediastinum.  The sternum was then closed with double #6 stainless steel wires.  Fascia was closed with continuous #1 Vicryl suture.  Subcutaneous tissue was closed with continuous 2-0 Vicryl and the skin with 3-0 Vicryl subcuticular closure.  The lower extremity vein harvest site was closed in layers in a similar manner. The sponge, needle, and instrument counts were correct according to scrub nurse.  Dry sterile dressing was applied over the incisions around the chest tubes, which were Pleur-Evac suction.  The patient remained hemodynamically stable and transferred to the SICU in guarded, but stable condition.     Evelene Croon, M.D.     BB/MEDQ  D:  12/13/2011  T:  12/13/2011  Job:  [id number]  cc:   Harold Hedge, MD

## 2011-12-14 NOTE — Progress Notes (Addendum)
2 Days Post-Op Procedure(s) (LRB): CORONARY ARTERY BYPASS GRAFTING (CABG) (N/A)  Subjective: Patient is hungry this am.  Objective: Vital signs in last 24 hours: Patient Vitals for the past 24 hrs:  BP Temp Temp src Pulse Resp SpO2 Weight  12/14/11 0452 142/60 mmHg 99.7 F (37.6 C) Oral 94  - 97 % 212 lb (96.163 kg)  12/13/11 2100 - 99.4 F (37.4 C) Oral - - - -  12/13/11 1957 132/50 mmHg 100.2 F (37.9 C) Oral 91  18  97 % -  12/13/11 1400 135/75 mmHg 98.4 F (36.9 C) Oral 88  18  95 % -  12/13/11 1151 142/73 mmHg 98.6 F (37 C) Oral 82  18  96 % -  12/13/11 1134 - 98.1 F (36.7 C) Oral - - - -  12/13/11 1100 122/56 mmHg - - 86  16  99 % -  12/13/11 1000 106/59 mmHg - - 90  17  98 % -  12/13/11 0900 112/54 mmHg - - 84  17  98 % -   Pre op weight  94 kg Current Weight  12/14/11 212 lb (96.163 kg)      Intake/Output from previous day: 08/23 0701 - 08/24 0700 In: 1000 [P.O.:960; I.V.:40] Out: 1160 [Urine:1130; Chest Tube:30]   Physical Exam:  Cardiovascular: RRR, no murmurs, gallops, or rubs. Pulmonary: Diminished at bases; no rales, wheezes, or rhonchi. Abdomen: Soft, non tender, bowel sounds present. Extremities: Mild bilateral lower extremity edema. Wounds: Dressings are clean and dry.   Lab Results: CBC: Basename 12/14/11 0520 12/13/11 1655  WBC 11.5* 9.0  HGB 9.0* 9.0*  HCT 29.5* 29.4*  PLT 126* 127*   BMET:  Basename 12/14/11 0520 12/13/11 1655 12/13/11 0400  NA 140 -- 143  K 4.2 -- 4.4  CL 104 -- 111  CO2 24 -- 25  GLUCOSE 210* -- 195*  BUN 23 -- 14  CREATININE 1.19 1.06 --  CALCIUM 8.7 -- 8.2*    PT/INR:  Lab Results  Component Value Date   INR 1.41 12/12/2011   INR 0.96 12/10/2011   ABG:  INR: Will add last result for INR, ABG once components are confirmed Will add last 4 CBG results once components are confirmed  Assessment/Plan:  1. CV - SR. Continue Lopressor 25 bid. 2.  Pulmonary - Encourage incentive spirometer. 3. Volume  Overload - Diurese. 4.  Acute blood loss anemia - H and H stable at 9 and 29.5.Restart iron in am. 5.DM-CBGs 257/236/188.Pre op HGA1C 8.8.Restart Metformin 500 bid. Decrease Insulin until taking po better. 6.Fever to 100.4. WBC slightly elevated at 11,500.Only POD 2. Also, has atelectasis on CXR. 7.Thrombocytopenia-platelets stable at 126,000. 8.Continue with CRPI.  ZIMMERMAN,DONIELLE MPA-C 12/14/2011   I have seen and examined [name] and agree with the above assessment  and plan.  Delight Ovens MD Beeper [phone] Office [phone] 12/14/2011 12:23 PM

## 2011-12-14 NOTE — Progress Notes (Signed)
CARDIAC REHAB PHASE I   PRE:  Rate/Rhythm: 98SR  BP:  Supine:   Sitting: 120/70  Standing:    SaO2: 95%RA  MODE:  Ambulation: 420 ft   POST:  Rate/Rhythem: 99  BP:  Supine:   Sitting: 124/60  Standing:    SaO2: 97%RA 1407-1436 Pt walked 420 ft on RA with rolling walker and asst x 1. Gait slow and steady. Took several standing rest periods. To recliner after walk. Tolerated well. Call bell in reach.  [name]

## 2011-12-15 LAB — GLUCOSE, CAPILLARY: Glucose-Capillary: 204 mg/dL — ABNORMAL HIGH (ref 70–99)

## 2011-12-15 MED ORDER — INSULIN GLARGINE 100 UNIT/ML ~~LOC~~ SOLN
14.0000 [IU] | Freq: Two times a day (BID) | SUBCUTANEOUS | Status: DC
  Administered 2011-12-15 (×2): 14 [IU] via SUBCUTANEOUS

## 2011-12-15 MED ORDER — METFORMIN HCL 500 MG PO TABS
1000.0000 mg | ORAL_TABLET | Freq: Two times a day (BID) | ORAL | Status: DC
  Administered 2011-12-15 – 2011-12-22 (×14): 1000 mg via ORAL
  Filled 2011-12-15 (×16): qty 2

## 2011-12-15 NOTE — Progress Notes (Addendum)
3 Days Post-Op Procedure(s) (LRB): CORONARY ARTERY BYPASS GRAFTING (CABG) (N/A)  Subjective: Patient incontinent of urine yesterday-has condom cath on.Daughter said he called her mom last night and was confused. He is a little somnolent this am and was just give Oxycodone.  Objective: Vital signs in last 24 hours: Patient Vitals for the past 24 hrs:  BP Temp Temp src Pulse Resp SpO2 Weight  12/15/11 0700 - 97.1 F (36.2 C) Oral - - - -  12/15/11 7829 - - - - - - 208 lb 4.8 oz (94.484 kg)  12/15/11 0437 135/63 mmHg 100.6 F (38.1 C) Oral 89  18  98 % -  12/15/11 0048 158/57 mmHg 99.1 F (37.3 C) Oral 97  17  98 % -  12/14/11 2058 144/62 mmHg 99.5 F (37.5 C) Oral 95  19  99 % -  12/14/11 1505 127/47 mmHg 98.2 F (36.8 C) Oral 90  18  95 % -   Pre op weight  94 kg Current Weight  12/15/11 208 lb 4.8 oz (94.484 kg)      Intake/Output from previous day: 08/24 0701 - 08/25 0700 In: 420 [P.O.:420] Out: 1851 [Urine:1850; Stool:1]   Physical Exam:  Cardiovascular: RRR, no murmurs, gallops, or rubs. Pulmonary: Diminished at bases; no rales, wheezes, or rhonchi. Abdomen: Soft, non tender, bowel sounds present. Extremities: Mild bilateral lower extremity edema. Wounds: Clean and dry.   Lab Results: CBC:  Basename 12/14/11 0520 12/13/11 1655  WBC 11.5* 9.0  HGB 9.0* 9.0*  HCT 29.5* 29.4*  PLT 126* 127*   BMET:   Basename 12/14/11 0520 12/13/11 1655 12/13/11 0400  NA 140 -- 143  K 4.2 -- 4.4  CL 104 -- 111  CO2 24 -- 25  GLUCOSE 210* -- 195*  BUN 23 -- 14  CREATININE 1.19 1.06 --  CALCIUM 8.7 -- 8.2*    PT/INR:  Lab Results  Component Value Date   INR 1.41 12/12/2011   INR 0.96 12/10/2011   ABG:  INR: Will add last result for INR, ABG once components are confirmed Will add last 4 CBG results once components are confirmed  Assessment/Plan:  1. CV - SR. Continue Lopressor 25 bid. 2.  Pulmonary - Encourage incentive spirometer. 3. Volume Overload -  Diurese. 4.  Acute blood loss anemia - H and H stable at 9 and 29.5.Restart iron in am. 5.DM-CBGs 277/197/204.Pre op HGA1C 8.8.Increase Metformin to 1000 bid and Insulin. 6.Fever to 100.6. Has remained afebrile since yesterday.WBC slightly elevated at 11,500. Also, has atelectasis on CXR, but wil check UA. 7.Thrombocytopenia-platelets stable at 126,000. 8.Continue with CRPI. 9.Stop oxycodone as likely etiology of hallucinations.  ZIMMERMAN,DONIELLE MPA-C 12/15/2011 9:22 AM  Less confused today, decrease pain meds I have seen and examined Jacelyn Pi Nolley and agree with the above assessment  and plan.  Delight Ovens MD Beeper [phone] Office [phone] 12/15/2011 12:23 PM

## 2011-12-16 DIAGNOSIS — Z951 Presence of aortocoronary bypass graft: Secondary | ICD-10-CM

## 2011-12-16 LAB — GLUCOSE, CAPILLARY
Glucose-Capillary: 181 mg/dL — ABNORMAL HIGH (ref 70–99)
Glucose-Capillary: 177 mg/dL — ABNORMAL HIGH (ref 70–99)
Glucose-Capillary: 207 mg/dL — ABNORMAL HIGH (ref 70–99)

## 2011-12-16 MED ORDER — INSULIN GLARGINE 100 UNIT/ML ~~LOC~~ SOLN
16.0000 [IU] | Freq: Two times a day (BID) | SUBCUTANEOUS | Status: DC
  Administered 2011-12-16 (×2): 16 [IU] via SUBCUTANEOUS

## 2011-12-16 MED ORDER — TRAMADOL HCL 50 MG PO TABS: 50.0000 mg | ORAL_TABLET | ORAL | Status: AC | PRN

## 2011-12-16 MED ORDER — FERROUS SULFATE 325 (65 FE) MG PO TABS: 325.0000 mg | ORAL_TABLET | Freq: Every day | ORAL | Status: DC

## 2011-12-16 MED ORDER — ALPRAZOLAM 0.25 MG PO TABS: 0.2500 mg | ORAL_TABLET | Freq: Two times a day (BID) | ORAL | Status: DC | PRN

## 2011-12-16 MED ORDER — METOPROLOL TARTRATE 25 MG PO TABS: 25.0000 mg | ORAL_TABLET | Freq: Two times a day (BID) | ORAL | Status: AC

## 2011-12-16 MED ORDER — ASPIRIN 325 MG PO TBEC: 325.0000 mg | DELAYED_RELEASE_TABLET | Freq: Every day | ORAL | Status: AC

## 2011-12-16 MED ORDER — GUAIFENESIN ER 600 MG PO TB12
600.0000 mg | ORAL_TABLET | Freq: Two times a day (BID) | ORAL | Status: DC
  Administered 2011-12-16 – 2011-12-17 (×2): 600 mg via ORAL
  Filled 2011-12-16 (×3): qty 1

## 2011-12-16 MED FILL — Electrolyte-R (PH 7.4) Solution: INTRAVENOUS | Qty: 4000 | Status: AC

## 2011-12-16 MED FILL — Heparin Sodium (Porcine) Inj 1000 Unit/ML: INTRAMUSCULAR | Qty: 30 | Status: AC

## 2011-12-16 MED FILL — Sodium Chloride Irrigation Soln 0.9%: Qty: 3000 | Status: AC

## 2011-12-16 MED FILL — Lidocaine HCl IV Inj 20 MG/ML: INTRAVENOUS | Qty: 5 | Status: AC

## 2011-12-16 MED FILL — Sodium Chloride IV Soln 0.9%: INTRAVENOUS | Qty: 1000 | Status: AC

## 2011-12-16 MED FILL — Heparin Sodium (Porcine) Inj 1000 Unit/ML: INTRAMUSCULAR | Qty: 10 | Status: AC

## 2011-12-16 MED FILL — Mannitol IV Soln 20%: INTRAVENOUS | Qty: 500 | Status: AC

## 2011-12-16 MED FILL — Sodium Bicarbonate IV Soln 8.4%: INTRAVENOUS | Qty: 50 | Status: AC

## 2011-12-16 NOTE — Progress Notes (Signed)
Inpatient Diabetes Program Recommendations  AACE/ADA: New Consensus Statement on Inpatient Glycemic Control (2013)  Target Ranges:  Prepandial:   less than 140 mg/dL      Peak postprandial:   less than 180 mg/dL (1-2 hours)      Critically ill patients:  140 - 180 mg/dL   Reason for Visit: Request to continue q 4 hrs correction insulin coverage  Noted request to change to tidwc cbg's and coverage.  However, pt is not coherent and is eating very little. Please continue the q 4hrs correction as he's been averaging a need of approximately 4 units q 4 hrs.  Once eating, can decrease to tidwc and HS and increase his basal Lantus closer to home dose of 60 units bid.   Note: Thank you, Lenor Coffin, RN, CNS, Diabetes Coordinator [phone])

## 2011-12-16 NOTE — Discharge Summary (Signed)
Physician Discharge Summary  Patient ID: [name] MRN: [id number] DOB/AGE: Apr 24, 1940 71 y.o.  Admit date: 12/12/2011 Discharge date: 12/22/2011  Admission Diagnoses:  Patient Active Problem List  Diagnosis  . Diabetes mellitus  . Hyperlipidemia  . CAD (coronary artery disease)  . Sleep apnea  . Asthma  . BPH (benign prostatic hypertrophy)  . Anxiety  . Syncope  . S/P CABG x 4   Discharge Diagnoses:   Patient Active Problem List  Diagnosis  . Diabetes mellitus  . Hyperlipidemia  . CAD (coronary artery disease)  . Sleep apnea  . Asthma  . BPH (benign prostatic hypertrophy)  . Anxiety  . Syncope  . S/P CABG x 4   Discharged Condition: good  History of Present Illness :   [name] is a 71 yo male with known diabetes and CAD S/P PCI with stent placement to his RCA performed 05/2006 by Dr. Lady Gary.  He also underwent exercise stress test in 05/2007 which revealed a decreased EF of 40% with a mild global hypokinesis and inferolateral scar.  The patient had been doing well until recently.  Over the past 4-5 months the patient has developed intermittent substernal dull chest pressure associated with shortness of breath.  This has been getting progressively worse and is now occurring while ambulating in house.  He underwent a Myocardial perfusion study at [facility] which showed evidence of multivessel CAD with reversible ischemia involving the anterior, anterolateral, lateral and inferolateral walls.  The test was stopped due to development of hypotension and fatigue.  He was referred back to Dr. Lady Gary for cardiac catheterization which showed multivessel CAD.  It was felt the patient would benefit from coronary bypass and he was referred to [facility] for further evaluation.  He was evaluated by Dr. Laneta Simmers on 12/04/2011 who felt that coronary bypass would be his best treatment option.  The risks and benefits of the procedure were explained to the patient and he was agreeable to proceed.   His surgery was scheduled for 12/12/2011.  Hospital Course:   Ms. Zaremba presented to [facility] on 12/12/2011.  He was taken to the operating room and underwent CABG x4 utilizing LIMA to LAD, SVG to Ramus Intermediate, SVG to distal Left Circumflex, and distal RCA.  He also underwent endoscopic saphenous vein harvest of his right leg.  He tolerated the procedure well and was taken to the SICU in stable condition.  POD #0 the patient was extubated.  POD #1 the patient was doing very well.  His chest tubes and arterial lines were removed.  He was transferred to the telemetry unit in stable condition.  POD #2 his chest xray did not show any evidence of pneumothorax.  He developed confusion overnight which was felt to be related to use of narcotic pain medication which was discontinued.  POD #3 patient having difficulties with urinary incontinence.  He is maintaining NSR.  POD #4 patient continues to have urinary incontinence.  He has a mild leukocytosis.  POD #6 patient had pulmonary congestion.  He was treated with nebulizer treatments and remained on oxygen therapy.  POD #7 patient continues to have productive cough.  He has hypertension and was started on low dose ACE inhibitor.  POD #8 patient developed progressive weakness.  His wife states that its due to the Lisinopril because he had similar problems the last time he took that medication.  Therefore, his Lisinopril was discontinued.  He continued to have a productive cough with difficulty expectorating  sputum.  He states when he is able to cough it up its green, culture was obtained  He was started on Levaquin due to rattling cough and rhonci on exam.  Sputum Culture is positive for Haemophilus Parainfluenza  POD #9 patient feels remarkably better.  After receiving nebulizer treatments yesterday the patient has been successful in coughing up sputum.  He is no longer weak with ambulation and PT wishes to evaluate patient in the morning to ensure he  will remain this strong.  As long as patient continues to feel good we will plan for discharge home in the morning.  We will continue Levaquin for 7 days of therapy.   He will follow up at [facility] on 01/07/2012 at 2:30pm.  He will need to schedule follow up with Dr. Lady Gary for 2 weeks from hospital discharge.  Treatments: surgery:   Median sternotomy, extracorporeal circulation,  coronary artery bypass graft surgery x4 using a left internal mammary  artery graft and left anterior descending coronary artery, with a  saphenous vein graft to the intermediate coronary artery, the distal  left circumflex coronary artery, and the right coronary artery.  Endoscopic vein harvesting from the right leg.   Disposition: Home  Discharge Orders    Future Appointments: Provider: Department: Dept Phone: Center:   01/07/2012 2:30 PM Alleen Borne, MD [facility] [phone] [facility]     Medication List  As of 12/22/2011  9:07 AM   STOP taking these medications         aspirin 81 MG chewable tablet      isosorbide mononitrate 60 MG 24 hr tablet         TAKE these medications         aspirin 325 MG EC tablet   Take 1 tablet (325 mg total) by mouth daily.      ferrous sulfate 325 (65 FE) MG tablet   Take 1 tablet (325 mg total) by mouth daily with breakfast.      fish oil-omega-3 fatty acids 1000 MG capsule   Take 1 g by mouth 2 (two) times daily.      furosemide 40 MG tablet   Commonly known as: LASIX   Take 40 mg by mouth every morning.      gabapentin 300 MG capsule   Commonly known as: NEURONTIN   Take 300 mg by mouth 2 (two) times daily.      guaiFENesin 600 MG 12 hr tablet   Commonly known as: MUCINEX   Take 2 tablets (1,200 mg total) by mouth 2 (two) times daily.      insulin aspart 100 UNIT/ML injection   Commonly known as: novoLOG   Inject 4-5 Units into the skin 3 (three) times daily before meals. Per sliding scale      insulin glargine 100 UNIT/ML injection   Commonly known  as: LANTUS   Inject 30 Units into the skin 2 (two) times daily.      levofloxacin 750 MG tablet   Commonly known as: LEVAQUIN   Take 1 tablet (750 mg total) by mouth daily. For 7 days      lovastatin 20 MG tablet   Commonly known as: MEVACOR   Take 40 mg by mouth at bedtime.      meloxicam 15 MG tablet   Commonly known as: MOBIC   Take 7.5 mg by mouth every morning.      metFORMIN 500 MG (MOD) 24 hr tablet   Commonly known as: GLUMETZA  Take 1,000 mg by mouth 2 (two) times daily with a meal.      metoprolol tartrate 25 MG tablet   Commonly known as: LOPRESSOR   Take 1 tablet (25 mg total) by mouth 2 (two) times daily.      omeprazole 20 MG capsule   Commonly known as: PRILOSEC   Take 20 mg by mouth 2 (two) times daily.      Potassium Aminobenzoate 500 MG Caps   Take 1 capsule by mouth every morning.      traMADol 50 MG tablet   Commonly known as: ULTRAM   Take 1-2 tablets (50-100 mg total) by mouth every 4 (four) hours as needed.      VITRON-C 65-125 MG Tabs   Generic drug: Iron-Vitamin C   Take 1 tablet by mouth every morning.           Follow-up Information    Follow up with Alleen Borne, MD on 01/07/2012. (Appointment is at 2:30)    Contact information:   [address] [place] [address] [phone]       Follow up with [facility]. (Please get chest xray 1 hour prior to your appointment with Dr. Laneta Simmers)       Follow up with Dalia Heading., MD in 2 weeks. (Please contact office to set up appointment for 2 weeks from hospital discharge)    Contact information:   [address] [place] [address] [phone]          Signed: Lowella Dandy 12/22/2011, 9:07 AM

## 2011-12-16 NOTE — Progress Notes (Addendum)
4 Days Post-Op Procedure(s) (LRB): CORONARY ARTERY BYPASS GRAFTING (CABG) (N/A)  Subjective: Patient incontinent of urine  So has condom cath on.He states he is not feeling all that well this am. Has broken out in a "cold sweat" a few times. States food doesn't taste "good".He denies abdominal pain, nausea, or emesis.  Objective: Vital signs in last 24 hours: Patient Vitals for the past 24 hrs:  BP Temp Temp src Pulse Resp SpO2 Weight  12/16/11 0359 146/71 mmHg 97.8 F (36.6 C) Oral 89  17  100 % 210 lb 9.6 oz (95.528 kg)  12/16/11 0118 - 98.3 F (36.8 C) Oral 91  - 100 % -  12/15/11 2048 153/65 mmHg 99.2 F (37.3 C) Oral 94  19  94 % -  12/15/11 1406 119/54 mmHg 97.3 F (36.3 C) Oral 81  18  95 % -   Pre op weight  94 kg Current Weight  12/16/11 210 lb 9.6 oz (95.528 kg)      Intake/Output from previous day: 08/25 0701 - 08/26 0700 In: -  Out: 1300 [Urine:1300]   Physical Exam:  Cardiovascular: RRR, no murmurs, gallops, or rubs. Pulmonary: Rhonchi. Abdomen: Soft, non tender, bowel sounds present. Extremities: Mild bilateral lower extremity edema. Wounds: Clean and dry.   Lab Results: CBC:  Basename 12/14/11 0520 12/13/11 1655  WBC 11.5* 9.0  HGB 9.0* 9.0*  HCT 29.5* 29.4*  PLT 126* 127*   BMET:   Basename 12/14/11 0520 12/13/11 1655  NA 140 --  K 4.2 --  CL 104 --  CO2 24 --  GLUCOSE 210* --  BUN 23 --  CREATININE 1.19 1.06  CALCIUM 8.7 --    PT/INR:  Lab Results  Component Value Date   INR 1.41 12/12/2011   INR 0.96 12/10/2011   ABG:  INR: Will add last result for INR, ABG once components are confirmed Will add last 4 CBG results once components are confirmed  Assessment/Plan:  1. CV - SR. Continue Lopressor 25 bid. 2.  Pulmonary - Encourage incentive spirometer and flutter valve.Check CXR in am. 3. Volume Overload - Continues to diurese well. 4.  Acute blood loss anemia - H and H stable at 9 and 29.5.Restart iron in am. 5.DM-CBGs  188/161/181.Pre op HGA1C 8.8.Continue Metformin to 1000 bid and Insulin. 6.Previous fever to 100.6 8/24. Has remained afebrile since yesterday.WBC slightly elevated at 11,500. Will check a UA and CXR. 7.Thrombocytopenia-platelets stable at 126,000. 8.Regarding cold sweats, has not been hypoglycemic and vital signs have remained stable. Has a history of anxiety. Will give low dose Xanax PRN. 9.Continue CRPI.  ZIMMERMAN,DONIELLE MPA-C 12/16/2011 7:38 AM     Chart reviewed, patient examined, agree with above. He looks uncomfortable and says he hasn't been taking much pain medicine. Encouraged to do so and then to work on IS and flutter valve.

## 2011-12-16 NOTE — Progress Notes (Signed)
CARDIAC REHAB PHASE I   PRE:  Rate/Rhythm: 90SR  BP:  Supine:   Sitting: 118/70  Standing:    SaO2: 98%RA  MODE:  Ambulation: 420 ft   POST:  Rate/Rhythem: 95  BP:  Supine:   Sitting: 140/74  Standing:    SaO2: 98%RA 0937-1005 Pt very sleepy. Walked 420 ft on RA with rolling walker and asst x 2. Encouraged pt to take bigger steps. Pt was taking small steps and barely moving. Eyes half-opened. Stated did not sleep well. To recliner after walk. Family in room. Pt sounds congested. Tried to get pt to cough productively without success. Discussed CRP 2 with wife. Will send letter of interest to [facility] for followup.  Duanne Limerick

## 2011-12-17 ENCOUNTER — Inpatient Hospital Stay (HOSPITAL_COMMUNITY): Payer: Medicare Other

## 2011-12-17 LAB — CBC
HCT: 28.6 % — ABNORMAL LOW (ref 39.0–52.0)
Hemoglobin: 8.7 g/dL — ABNORMAL LOW (ref 13.0–17.0)
MCH: 24.6 pg — ABNORMAL LOW (ref 26.0–34.0)
MCHC: 30.4 g/dL (ref 30.0–36.0)
MCV: 80.8 fL (ref 78.0–100.0)
RBC: 3.54 MIL/uL — ABNORMAL LOW (ref 4.22–5.81)

## 2011-12-17 LAB — GLUCOSE, CAPILLARY
Glucose-Capillary: 136 mg/dL — ABNORMAL HIGH (ref 70–99)
Glucose-Capillary: 183 mg/dL — ABNORMAL HIGH (ref 70–99)
Glucose-Capillary: 191 mg/dL — ABNORMAL HIGH (ref 70–99)
Glucose-Capillary: 156 mg/dL — ABNORMAL HIGH (ref 70–99)

## 2011-12-17 MED ORDER — INSULIN GLARGINE 100 UNIT/ML ~~LOC~~ SOLN
25.0000 [IU] | Freq: Two times a day (BID) | SUBCUTANEOUS | Status: DC
  Administered 2011-12-17 – 2011-12-18 (×4): 25 [IU] via SUBCUTANEOUS

## 2011-12-17 MED ORDER — ACETYLCYSTEINE 10 % IN SOLN
2.0000 mL | Freq: Four times a day (QID) | RESPIRATORY_TRACT | Status: AC
  Administered 2011-12-17: 2 mL via RESPIRATORY_TRACT
  Filled 2011-12-17: qty 4

## 2011-12-17 MED ORDER — ALBUTEROL SULFATE (5 MG/ML) 0.5% IN NEBU
2.5000 mg | INHALATION_SOLUTION | Freq: Four times a day (QID) | RESPIRATORY_TRACT | Status: AC
  Administered 2011-12-17 – 2011-12-18 (×2): 2.5 mg via RESPIRATORY_TRACT
  Filled 2011-12-17 (×4): qty 0.5

## 2011-12-17 MED ORDER — ALBUTEROL SULFATE (5 MG/ML) 0.5% IN NEBU
INHALATION_SOLUTION | RESPIRATORY_TRACT | Status: AC
  Administered 2011-12-17: 2.5 mg via RESPIRATORY_TRACT
  Filled 2011-12-17: qty 0.5

## 2011-12-17 MED ORDER — ALBUTEROL SULFATE (5 MG/ML) 0.5% IN NEBU
2.5000 mg | INHALATION_SOLUTION | RESPIRATORY_TRACT | Status: DC | PRN
  Administered 2011-12-17 (×2): 2.5 mg via RESPIRATORY_TRACT
  Filled 2011-12-17 (×2): qty 0.5

## 2011-12-17 MED ORDER — ACETYLCYSTEINE 10 % IN SOLN
2.0000 mL | Freq: Four times a day (QID) | RESPIRATORY_TRACT | Status: DC
  Filled 2011-12-17 (×3): qty 4

## 2011-12-17 MED ORDER — GUAIFENESIN ER 600 MG PO TB12
1200.0000 mg | ORAL_TABLET | Freq: Two times a day (BID) | ORAL | Status: DC
  Administered 2011-12-17 – 2011-12-22 (×10): 1200 mg via ORAL
  Filled 2011-12-17 (×11): qty 2

## 2011-12-17 NOTE — Progress Notes (Addendum)
[address]            [place] [address]          [phone]     5 Days Post-Op  Procedure(s) (LRB): CORONARY ARTERY BYPASS GRAFTING (CABG) (N/A) Subjective: C/O congestion with mild SOB  Objective  Telemetry sinus rhythm with pvc's  Temp:  [97.6 F (36.4 C)-98.3 F (36.8 C)] 97.6 F (36.4 C) (08/27 0429) Pulse Rate:  [86-91] 86  (08/27 0429) Resp:  [20] 20  (08/27 0429) BP: (123-141)/(61-72) 130/72 mmHg (08/27 0429) SpO2:  [91 %-99 %] 91 % (08/27 0755) Weight:  [203 lb 6.4 oz (92.262 kg)] 203 lb 6.4 oz (92.262 kg) (08/27 0429)   Intake/Output Summary (Last 24 hours) at 12/17/11 0807 Last data filed at 12/17/11 0540  Gross per 24 hour  Intake    220 ml  Output   1250 ml  Net  -1030 ml       General appearance: alert, cooperative and no distress Heart: regular rate and rhythm and S1, S2 normal Lungs: coarse throughout Abdomen: + distension, + BS, nontender Extremities: R>L LE edema Wound: incisions healing well  Lab Results: No results found for this basename: NA:2,K:2,CL:2,CO2:2,GLUCOSE:2,BUN:2,CREATININE:2,CALCIUM:2,MG:2,PHOS:2 in the last 72 hours No results found for this basename: AST:2,ALT:2,ALKPHOS:2,BILITOT:2,PROT:2,ALBUMIN:2 in the last 72 hours No results found for this basename: LIPASE:2,AMYLASE:2 in the last 72 hours  Basename 12/17/11 0620  WBC 9.1  NEUTROABS --  HGB 8.7*  HCT 28.6*  MCV 80.8  PLT 226   No results found for this basename: CKTOTAL:4,CKMB:4,TROPONINI:4 in the last 72 hours No components found with this basename: POCBNP:3 No results found for this basename: DDIMER in the last 72 hours No results found for this basename: HGBA1C in the last 72 hours No results found for this basename: CHOL,HDL,LDLCALC,TRIG,CHOLHDL in the last 72 hours No results found for this basename: TSH,T4TOTAL,FREET3,T3FREE,THYROIDAB in the last 72 hours No results found for this basename:  VITAMINB12,FOLATE,FERRITIN,TIBC,IRON,RETICCTPCT in the last 72 hours  Medications: Scheduled    . aspirin EC  325 mg Oral Daily  . Chlorhexidine Gluconate Cloth  6 each Topical Daily  . docusate sodium  200 mg Oral Daily  . ferrous sulfate  325 mg Oral Q breakfast  . furosemide  40 mg Oral Daily  . gabapentin  300 mg Oral BID  . guaiFENesin  600 mg Oral BID  . insulin aspart  0-24 Units Subcutaneous Q4H  . insulin glargine  16 Units Subcutaneous BID  . metFORMIN  1,000 mg Oral BID WC  . metoprolol tartrate  25 mg Oral BID  . mupirocin ointment  1 application Nasal BID  . pantoprazole  40 mg Oral QAC breakfast  . simvastatin  20 mg Oral q1800  . sodium chloride  3 mL Intravenous Q12H     Radiology/Studies:  Dg Chest 2 View  12/17/2011  *RADIOLOGY REPORT*  Clinical Data: Post CABG, cough, congestion  CHEST - 2 VIEW  Comparison: 12/13/2011; 12/12/2011; 12/10/2011  Findings: Grossly unchanged enlarged cardiac silhouette and mediastinal contours post median sternotomy.  Interval removal of right jugular approach PA catheter.  Interval removal of mediastinal drain and left-sided chest tube. Epicardial electrical leads remain.  Grossly unchanged asymmetric pleural thickening along the peripheral aspect the left upper and mid lung  No definite pneumothorax.  There is blunting of the left costophrenic angle suggestive of a small left-sided effusion.  Bibasilar heterogeneous opacities, left greater than right, favored to represent atelectasis. No new focal airspace opacities.  Grossly unchanged bones including lower cervical ACDF.  IMPRESSION: 1.  Interval removal of support apparatus.  No pneumothorax. 2.  Grossly unchanged findings of cardiomegaly, small left-sided effusion and bibasilar opacities, favored to represent atelectasis.   Original Report Authenticated By: Waynard Reeds, M.D.     INR: Will add last result for INR, ABG once components are confirmed Will add last 4 CBG results once  components are confirmed  Assessment/Plan: S/P Procedure(s) (LRB): CORONARY ARTERY BYPASS GRAFTING (CABG) (N/A)   1 Doing well 2 enc pulm toilet/ rehab, cont mucinex 3 diuresis 4 H/H stable, wbc improved, platelet count improved 5 cbg's fair control- increase lantus  LOS: 5 days    [name],Garrett E 8/27/20138:07 AM     Chart reviewed, patient examined, agree with above. His chest xray looks fairly good.

## 2011-12-17 NOTE — Progress Notes (Signed)
Have attempted to teach pt effective breathing exercises/for effective, productive cough , as pt is breathing very shallow and then unable to cough up anything.  And is having to cough frequently, struggling to cough effectively.  However, pt's response to my attempt was " I have to breathe how I can!"  Pt has been instructed and reminded numerous times to use IS and Flutter Valve.  He looked at it, and then,  Looked away. Instructed pt not to use arms to push up on, pt did it anyway.  Pt has been irritable, non-compliant.  Offered to walk pt for third time today and pt refused.  Stated, "I think I need to rest!"  C/o's ineffective pain management, even though I have medicated him 3 times in 8 hrs, c/o's being hot,  I offered to adjust temp in room.  Is not running fever.

## 2011-12-17 NOTE — Progress Notes (Addendum)
CARDIAC REHAB PHASE I   PRE:  Rate/Rhythm: 102ST  BP:  Supine:   Sitting: 130/66  Standing:    SaO2: 98-99%RA  MODE:  Ambulation: 500 ft   POST:  Rate/Rhythem: 107  BP:  Supine:   Sitting: 142/72  Standing:    SaO2: 96-97%RA 1610-9604 Assisted to bathroom before walk. Pt walked 500 ft on RA with rolling walker and asst x 1. Pt wanted to cut walk short. Said he was SOB this am. Encouraged pursed-lip breathing but pt stated his nose was stuffy. To recliner after walk. Wife in room. Encouraged IS and flutter valve. Letter of interest in CRP 2 to [facility] will be sent not [facility] as written yesterday.Luan Pulling, Consuello Closs

## 2011-12-18 ENCOUNTER — Encounter (HOSPITAL_COMMUNITY): Payer: Self-pay

## 2011-12-18 LAB — GLUCOSE, CAPILLARY
Glucose-Capillary: 149 mg/dL — ABNORMAL HIGH (ref 70–99)
Glucose-Capillary: 172 mg/dL — ABNORMAL HIGH (ref 70–99)

## 2011-12-18 MED ORDER — INSULIN ASPART 100 UNIT/ML ~~LOC~~ SOLN
0.0000 [IU] | Freq: Three times a day (TID) | SUBCUTANEOUS | Status: DC
  Administered 2011-12-18: 4 [IU] via SUBCUTANEOUS
  Administered 2011-12-19 (×4): 2 [IU] via SUBCUTANEOUS
  Administered 2011-12-20 (×2): 4 [IU] via SUBCUTANEOUS
  Administered 2011-12-20: 2 [IU] via SUBCUTANEOUS
  Administered 2011-12-21: 4 [IU] via SUBCUTANEOUS
  Administered 2011-12-21: 2 [IU] via SUBCUTANEOUS
  Administered 2011-12-21 – 2011-12-22 (×2): 8 [IU] via SUBCUTANEOUS

## 2011-12-18 MED ORDER — INSULIN ASPART 100 UNIT/ML ~~LOC~~ SOLN
0.0000 [IU] | SUBCUTANEOUS | Status: DC
  Administered 2011-12-18 (×2): 4 [IU] via SUBCUTANEOUS

## 2011-12-18 MED ORDER — MAGNESIUM HYDROXIDE 400 MG/5ML PO SUSP
15.0000 mL | Freq: Two times a day (BID) | ORAL | Status: DC | PRN
  Administered 2011-12-18: 15 mL via ORAL
  Filled 2011-12-18: qty 30

## 2011-12-18 MED ORDER — ENOXAPARIN SODIUM 40 MG/0.4ML ~~LOC~~ SOLN
40.0000 mg | SUBCUTANEOUS | Status: DC
  Administered 2011-12-18 – 2011-12-21 (×4): 40 mg via SUBCUTANEOUS
  Filled 2011-12-18 (×5): qty 0.4

## 2011-12-18 NOTE — Progress Notes (Signed)
CARDIAC REHAB PHASE I   PRE:  Rate/Rhythm: 85SR  BP:  Supine:   Sitting: 128/80  Standing:    SaO2: 95%RA  MODE:  Ambulation: 200 ft   POST:  Rate/Rhythem: 87  BP:  Supine:   Sitting: 100/50  Standing:    SaO2: 100%RA hall, 94%RA room 0937-1008 Pt not feeling well today. Was not able to tolerate walk as well today. Pt c/o pain between shoulder blades, pressure in chest that was not allowing deep breath and dizziness. Encouraged pt to do purse-lip breathing but he stated nose congested. Checked sats when he said he was SOB and at 100%RA. He was clammy during walk. Did not c/o dizziness until end of walk. BP was lower at that time at 100/50.  When I asked pt about pain after sitting in recliner, he said he was diabetic and did not feel much pain. But he had c/o pain between shoulder blades that was limiting walk. Only able to walk 200 ft as compared to 500 ft yesterday. To recliner after walk. Pt did not want pain med after walk.  [name]

## 2011-12-18 NOTE — Progress Notes (Addendum)
[name]            [place] [address]          [phone]     6 Days Post-Op  Procedure(s) (LRB): CORONARY ARTERY BYPASS GRAFTING (CABG) (N/A) Subjective: Feeling like he's breathing a little better, able to cough a little better.  Objective  Telemetry sinus rhythm. pvc's  Temp:  [98.2 F (36.8 C)-98.6 F (37 C)] 98.2 F (36.8 C) (08/28 0344) Pulse Rate:  [84-99] 89  (08/28 0344) Resp:  [18-20] 20  (08/28 0344) BP: (147-164)/(69-82) 147/70 mmHg (08/28 0344) SpO2:  [91 %-100 %] 100 % (08/28 0344) Weight:  [203 lb 9.6 oz (92.352 kg)] 203 lb 9.6 oz (92.352 kg) (08/28 0344)   Intake/Output Summary (Last 24 hours) at 12/18/11 0748 Last data filed at 12/18/11 0004  Gross per 24 hour  Intake    480 ml  Output    675 ml  Net   -195 ml       General appearance: alert, cooperative and no distress Heart: regular rate and rhythm and S1, S2 normal Lungs: coarse without wheeze Abdomen: moderate distension, + BS , nontender Extremities: R>L LE edema Wound: incisions healing well  Lab Results: No results found for this basename: NA:2,K:2,CL:2,CO2:2,GLUCOSE:2,BUN:2,CREATININE:2,CALCIUM:2,MG:2,PHOS:2 in the last 72 hours No results found for this basename: AST:2,ALT:2,ALKPHOS:2,BILITOT:2,PROT:2,ALBUMIN:2 in the last 72 hours No results found for this basename: LIPASE:2,AMYLASE:2 in the last 72 hours  Basename 12/17/11 0620  WBC 9.1  NEUTROABS --  HGB 8.7*  HCT 28.6*  MCV 80.8  PLT 226   No results found for this basename: CKTOTAL:4,CKMB:4,TROPONINI:4 in the last 72 hours No components found with this basename: POCBNP:3 No results found for this basename: DDIMER in the last 72 hours No results found for this basename: HGBA1C in the last 72 hours No results found for this basename: CHOL,HDL,LDLCALC,TRIG,CHOLHDL in the last 72 hours No results found for this basename: TSH,T4TOTAL,FREET3,T3FREE,THYROIDAB in the last 72 hours No results  found for this basename: VITAMINB12,FOLATE,FERRITIN,TIBC,IRON,RETICCTPCT in the last 72 hours  Medications: Scheduled    . acetylcysteine  2 mL Nebulization Q6H  . albuterol  2.5 mg Nebulization Q6H  . aspirin EC  325 mg Oral Daily  . Chlorhexidine Gluconate Cloth  6 each Topical Daily  . docusate sodium  200 mg Oral Daily  . ferrous sulfate  325 mg Oral Q breakfast  . furosemide  40 mg Oral Daily  . gabapentin  300 mg Oral BID  . guaiFENesin  1,200 mg Oral BID  . insulin aspart  0-24 Units Subcutaneous Q4H  . insulin glargine  25 Units Subcutaneous BID  . metFORMIN  1,000 mg Oral BID WC  . metoprolol tartrate  25 mg Oral BID  . mupirocin ointment  1 application Nasal BID  . pantoprazole  40 mg Oral QAC breakfast  . simvastatin  20 mg Oral q1800  . sodium chloride  3 mL Intravenous Q12H  . DISCONTD: acetylcysteine  2 mL Nebulization Q6H  . DISCONTD: guaiFENesin  600 mg Oral BID  . DISCONTD: insulin glargine  16 Units Subcutaneous BID     Radiology/Studies:  Dg Chest 2 View  12/17/2011  *RADIOLOGY REPORT*  Clinical Data: Post CABG, cough, congestion  CHEST - 2 VIEW  Comparison: 12/13/2011; 12/12/2011; 12/10/2011  Findings: Grossly unchanged enlarged cardiac silhouette and mediastinal contours post median sternotomy.  Interval removal of  right jugular approach PA catheter.  Interval removal of mediastinal drain and left-sided chest tube. Epicardial electrical leads remain.  Grossly unchanged asymmetric pleural thickening along the peripheral aspect the left upper and mid lung  No definite pneumothorax.  There is blunting of the left costophrenic angle suggestive of a small left-sided effusion.  Bibasilar heterogeneous opacities, left greater than right, favored to represent atelectasis. No new focal airspace opacities.  Grossly unchanged bones including lower cervical ACDF.  IMPRESSION: 1.  Interval removal of support apparatus.  No pneumothorax. 2.  Grossly unchanged findings of  cardiomegaly, small left-sided effusion and bibasilar opacities, favored to represent atelectasis.   Original Report Authenticated By: Waynard Reeds, M.D.     INR: Will add last result for INR, ABG once components are confirmed Will add last 4 CBG results once components are confirmed  Assessment/Plan: S/P Procedure(s) (LRB): CORONARY ARTERY BYPASS GRAFTING (CABG) (N/A)  1. Steady progress, needs aggressive pulm rx/toilet cont. Wean O2, nebs were added. 2. cbg's better control 3. Repeat lytes 4 mobilize as able 5 add milk of mag  LOS: 6 days    [name],[name] 8/28/20137:48 AM     Chart reviewed, patient examined, agree with above. He continues to have a lot of congestion although lungs sound fairly good. His PFT's were abnormal preop.

## 2011-12-19 LAB — GLUCOSE, CAPILLARY
Glucose-Capillary: 132 mg/dL — ABNORMAL HIGH (ref 70–99)
Glucose-Capillary: 121 mg/dL — ABNORMAL HIGH (ref 70–99)

## 2011-12-19 LAB — BASIC METABOLIC PANEL
BUN: 28 mg/dL — ABNORMAL HIGH (ref 6–23)
CO2: 26 mEq/L (ref 19–32)
Calcium: 9.3 mg/dL (ref 8.4–10.5)
Chloride: 104 mEq/L (ref 96–112)
Creatinine, Ser: 0.87 mg/dL (ref 0.50–1.35)
Glucose, Bld: 157 mg/dL — ABNORMAL HIGH (ref 70–99)

## 2011-12-19 MED ORDER — LISINOPRIL 2.5 MG PO TABS: 2.5000 mg | ORAL_TABLET | Freq: Every day | ORAL | Status: DC

## 2011-12-19 MED ORDER — LACTULOSE 10 GM/15ML PO SOLN
10.0000 g | Freq: Once | ORAL | Status: AC
  Administered 2011-12-19: 10 g via ORAL
  Filled 2011-12-19: qty 15

## 2011-12-19 MED ORDER — INSULIN GLARGINE 100 UNIT/ML ~~LOC~~ SOLN
30.0000 [IU] | Freq: Two times a day (BID) | SUBCUTANEOUS | Status: DC
  Administered 2011-12-19 – 2011-12-22 (×7): 30 [IU] via SUBCUTANEOUS

## 2011-12-19 MED ORDER — GUAIFENESIN ER 600 MG PO TB12: 1200.0000 mg | ORAL_TABLET | Freq: Two times a day (BID) | ORAL | Status: AC

## 2011-12-19 MED ORDER — INSULIN GLARGINE 100 UNIT/ML ~~LOC~~ SOLN: 30.0000 [IU] | Freq: Two times a day (BID) | SUBCUTANEOUS | Status: DC

## 2011-12-19 MED ORDER — POLYETHYLENE GLYCOL 3350 17 G PO PACK
17.0000 g | PACK | Freq: Every day | ORAL | Status: DC | PRN
  Administered 2011-12-19: 17 g via ORAL
  Filled 2011-12-19: qty 1

## 2011-12-19 MED ORDER — LISINOPRIL 2.5 MG PO TABS
2.5000 mg | ORAL_TABLET | Freq: Every day | ORAL | Status: DC
  Administered 2011-12-19: 2.5 mg via ORAL
  Filled 2011-12-19 (×3): qty 1

## 2011-12-19 NOTE — Progress Notes (Signed)
Pt complaining of bottom being sore, pt turned on left side. Two hours later pt complained about bottom being sore and wanted to get in the chair, barium cream applied at this time. Pain medicine was also given at this time. Two hours later pt complained about still not being comfortable, and wanted to get back in bed. Pt placed on right side to get off of bottom. Will continue to monitor.

## 2011-12-19 NOTE — Progress Notes (Addendum)
                    [address]            [place] [address]          [phone]     7 Days Post-Op Procedure(s) (LRB): CORONARY ARTERY BYPASS GRAFTING (CABG) (N/A)  Subjective: Still having productive cough, but breathing stable.  Gets tired after walking.  No BM yet.   Objective: Vital signs in last 24 hours: Patient Vitals for the past 24 hrs:  BP Temp Temp src Pulse Resp SpO2 Weight  12/19/11 0415 127/59 mmHg 98.7 F (37.1 C) Oral 86  19  97 % -  12/19/11 0118 - - - - - - 197 lb 4.8 oz (89.495 kg)  12/18/11 2007 146/66 mmHg - Oral 87  19  97 % -  12/18/11 1453 - - - - - 94 % -  12/18/11 1404 146/79 mmHg 98.2 F (36.8 C) Oral 9  20  93 % -   Current Weight  12/19/11 197 lb 4.8 oz (89.495 kg)     Intake/Output from previous day: 08/28 0701 - 08/29 0700 In: -  Out: 500 [Urine:200; Emesis/NG output:300]  CBGs [phone]  PHYSICAL EXAM:  Heart: RRR Lungs: Few coarse BS which clear with cough Wound: Clean and dry Extremities: Mild LE edema    Lab Results: CBC: Basename 12/17/11 0620  WBC 9.1  HGB 8.7*  HCT 28.6*  PLT 226   BMET:  Basename 12/19/11 0700  NA 140  K 3.8  CL 104  CO2 26  GLUCOSE 157*  BUN 28*  CREATININE 0.87  CALCIUM 9.3    PT/INR: No results found for this basename: LABPROT,INR in the last 72 hours    Assessment/Plan: S/P Procedure(s) (LRB): CORONARY ARTERY BYPASS GRAFTING (CABG) (N/A)  CV-stable, SR.  BPs trending up.  Will add low dose ACE-I as Cr is stable and watch.  Pulm- sats stable on RA.  Continue aggressive pulm toilet.  DM- sugars up.  Continue Metformin, will increase Lantus.  CRPI, LOC today.  Possible d/c 1-2 days if he continues to progress.   LOS: 7 days    COLLINS,GINA H 12/19/2011    Chart reviewed, patient examined, agree with above.

## 2011-12-19 NOTE — Progress Notes (Signed)
CARDIAC REHAB PHASE I   PRE:  Rate/Rhythm: 88SR  BP:  Supine:   Sitting: 116/66  Standing:    SaO2: 96%RA  MODE:  Ambulation: 420 ft   POST:  Rate/Rhythem: 97SR  BP:  Supine:   Sitting: 120/70  Standing:    SaO2: 93%RA 0750-0818 Pt walked 420 ft on RA with rolling walker and asst x 1. Tried to get pt to take bigger steps but he said he could not. Takes very small steps. C/o hurting everywhere. Had gotten pain med prior to walk. Wanted to stop walking short distance. Discussed with pt that he needs to walk farther to build up strength. Pt seems unmotivated and needs encouragement. To recliner after walk. Set up breakfast. Pt stated felt less SOB today.  [name]

## 2011-12-20 LAB — GLUCOSE, CAPILLARY: Glucose-Capillary: 82 mg/dL (ref 70–99)

## 2011-12-20 LAB — BASIC METABOLIC PANEL
BUN: 24 mg/dL — ABNORMAL HIGH (ref 6–23)
Creatinine, Ser: 0.93 mg/dL (ref 0.50–1.35)
GFR calc Af Amer: >90 mL/min (ref >90)
GFR calc non Af Amer: 83 mL/min — ABNORMAL LOW (ref >90)
Glucose, Bld: 73 mg/dL (ref 70–99)
Potassium: 3.5 mEq/L (ref 3.5–5.1)

## 2011-12-20 LAB — EXPECTORATED SPUTUM ASSESSMENT W GRAM STAIN, RFLX TO RESP C

## 2011-12-20 MED ORDER — LEVALBUTEROL HCL 0.63 MG/3ML IN NEBU
0.6300 mg | INHALATION_SOLUTION | Freq: Four times a day (QID) | RESPIRATORY_TRACT | Status: DC
  Administered 2011-12-20 – 2011-12-22 (×7): 0.63 mg via RESPIRATORY_TRACT
  Filled 2011-12-20 (×16): qty 3

## 2011-12-20 MED ORDER — LEVOFLOXACIN IN D5W 750 MG/150ML IV SOLN
750.0000 mg | INTRAVENOUS | Status: DC
  Administered 2011-12-20: 750 mg via INTRAVENOUS
  Filled 2011-12-20 (×2): qty 150

## 2011-12-20 MED ORDER — LISINOPRIL 5 MG PO TABS
5.0000 mg | ORAL_TABLET | Freq: Every day | ORAL | Status: DC
  Administered 2011-12-20: 5 mg via ORAL
  Filled 2011-12-20: qty 1

## 2011-12-20 NOTE — Progress Notes (Addendum)
8 Days Post-Op Procedure(s) (LRB): CORONARY ARTERY BYPASS GRAFTING (CABG) (N/A)  Subjective:  [name] complains of not feeling well today.  He states is just isn't able to cough anything up and its miserable.  He states the stuff he is able to get out is green.    Objective: Vital signs in last 24 hours: Temp:  [97.6 F (36.4 C)-98.6 F (37 C)] 98.6 F (37 C) (08/30 0507) Pulse Rate:  [82-87] 87  (08/30 0507) Cardiac Rhythm:  [-] Normal sinus rhythm (08/29 2024) Resp:  [16-18] 18  (08/30 0507) BP: (121-151)/(57-73) 142/60 mmHg (08/30 0507) SpO2:  [91 %-94 %] 93 % (08/30 0507) Weight:  [198 lb 8 oz (90.039 kg)] 198 lb 8 oz (90.039 kg) (08/30 0507)  Intake/Output from previous day: 08/29 0701 - 08/30 0700 In: 240 [P.O.:240] Out: 775 [Urine:775]  General appearance: alert, cooperative and no distress Heart: regular rate and rhythm Lungs: coarse throughout Abdomen: soft, non-tender; bowel sounds normal; no masses,  no organomegaly Extremities: edema trace Wound: clean and dry  Lab Results: No results found for this basename: WBC:2,HGB:2,HCT:2,PLT:2 in the last 72 hours BMET:  Basename 12/20/11 0500 12/19/11 0700  NA 141 140  K 3.5 3.8  CL 104 104  CO2 26 26  GLUCOSE 73 157*  BUN 24* 28*  CREATININE 0.93 0.87  CALCIUM 9.2 9.3    PT/INR: No results found for this basename: LABPROT,INR in the last 72 hours ABG    Component Value Date/Time   PHART 7.374 12/12/2011 2304   HCO3 25.0* 12/12/2011 2304   TCO2 26 12/12/2011 2304   ACIDBASEDEF 1.0 12/12/2011 2201   O2SAT 99.0 12/12/2011 2304   CBG (last 3)   Basename 12/20/11 0644 12/20/11 0621 12/19/11 2101  GLUCAP 82 69* 138*    Assessment/Plan: S/P Procedure(s) (LRB): CORONARY ARTERY BYPASS GRAFTING (CABG) (N/A)  1. CV- NSR, remains hypertensive started on lisinopril yesterday, will increase dose 2. Pulm- productive cough, difficulty expectorating sputum, on Mucinex, will order Xopenex continue IS, send sputum  culture 3. DM- CBGs controlled 4. Dispo- patient with productive cough, having difficulty expectoration sputum will order nebulizers, repeat CXR in AM, sputum culture, if feels better tomorrow can possibly d/c over the weekend   LOS: 8 days    [name], [name] 12/20/2011   He continues to feel weak, dizzy when he walks.  His wife is concerned about the Lisinopril because he was on that in the past and did not tolerate it. I will stop it. His BP and pulse are fine and he tends to be hypertensive so I am hesitant to stop the lopressor. I will stop the xanax because he always seems a little sleepy.  He still has a rattling cough and rhonchi on exam. His last cxr looked fairly good. He may have some bronchitis so I will start Levaquin pending sputum culture results.

## 2011-12-20 NOTE — Progress Notes (Signed)
CARDIAC REHAB PHASE I   PRE:  Rate/Rhythm: 86 SR  BP:  Supine: 130/60  Sitting:   Standing:    SaO2: 95 RA  MODE:  Ambulation: 220 ft   POST:  Rate/Rhythem: 93  BP:  Supine:   Sitting: 117/49 in hall 103/54 when back to room  Standing:    SaO2: 95 RA 1420-1500 On arrival pt in bed groggy, states that he is not sleeping well at night and that he does this at home.Assisted x 1 and used walker to ambulate. Pt taking small steps. He c/o of weakness and feeling dizzy. Had to sit pt in chair in hall.BP in hall 117/49. After sitting states that he had to go back to room. Pt still with congested cough stating he can not get sputum up.Had pt to go to recliner after walk. Encouraged use of IS and flutter value also staying OOB and in chair. Pt states he is feeling a lot weaker today. Beatrix Fetters

## 2011-12-21 ENCOUNTER — Inpatient Hospital Stay (HOSPITAL_COMMUNITY): Payer: Medicare Other

## 2011-12-21 LAB — GLUCOSE, CAPILLARY
Glucose-Capillary: 116 mg/dL — ABNORMAL HIGH (ref 70–99)
Glucose-Capillary: 175 mg/dL — ABNORMAL HIGH (ref 70–99)
Glucose-Capillary: 148 mg/dL — ABNORMAL HIGH (ref 70–99)

## 2011-12-21 LAB — CBC
HCT: 29.1 % — ABNORMAL LOW (ref 39.0–52.0)
Hemoglobin: 8.8 g/dL — ABNORMAL LOW (ref 13.0–17.0)
RBC: 3.6 MIL/uL — ABNORMAL LOW (ref 4.22–5.81)

## 2011-12-21 LAB — BASIC METABOLIC PANEL
BUN: 27 mg/dL — ABNORMAL HIGH (ref 6–23)
CO2: 23 mEq/L (ref 19–32)
Chloride: 99 mEq/L (ref 96–112)
Glucose, Bld: 162 mg/dL — ABNORMAL HIGH (ref 70–99)
Potassium: 3.9 mEq/L (ref 3.5–5.1)
Sodium: 135 mEq/L (ref 135–145)

## 2011-12-21 MED ORDER — LEVOFLOXACIN IN D5W 750 MG/150ML IV SOLN
750.0000 mg | INTRAVENOUS | Status: DC
  Administered 2011-12-21: 750 mg via INTRAVENOUS
  Filled 2011-12-21 (×2): qty 150

## 2011-12-21 MED ORDER — LEVOFLOXACIN 750 MG PO TABS: 750.0000 mg | ORAL_TABLET | Freq: Every day | ORAL | Status: AC

## 2011-12-21 NOTE — Progress Notes (Signed)
[id number] CABG d/c education completed with pt and pt's family including IS use, sternal precautions, restrictions, risk factors and activity progression. Pt voices understanding of instructions given.

## 2011-12-21 NOTE — Progress Notes (Addendum)
9 Days Post-Op Procedure(s) (LRB): CORONARY ARTERY BYPASS GRAFTING (CABG) (N/A)  Subjective: [name] feels much better this morning.  He states he has been able to cough a good bit of "stuff" up after this breathing treatments.  He asked if he could go home today  Objective: Vital signs in last 24 hours: Temp:  [98.3 F (36.8 C)-99 F (37.2 C)] 99 F (37.2 C) (08/30 1900) Pulse Rate:  [87] 87  (08/30 1408) Cardiac Rhythm:  [-] Normal sinus rhythm (08/30 2300) Resp:  [18-20] 20  (08/30 1900) BP: (128-139)/(59-73) 128/73 mmHg (08/30 1900) SpO2:  [91 %-98 %] 98 % (08/31 0746) Weight:  [196 lb 6.9 oz (89.1 kg)] 196 lb 6.9 oz (89.1 kg) (08/30 1900)  Intake/Output from previous day: 08/30 0701 - 08/31 0700 In: -  Out: 700 [Urine:700]  General appearance: alert, cooperative and no distress Neurologic: intact Heart: regular rate and rhythm Lungs:  breath sounds improved, continues to have some scattered rhonchi Abdomen: soft, non-tender; bowel sounds normal; no masses,  no organomegaly Extremities: edema 1+ Wound: clean and dry  Lab Results: No results found for this basename: WBC:2,HGB:2,HCT:2,PLT:2 in the last 72 hours BMET:  Basename 12/20/11 0500 12/19/11 0700  NA 141 140  K 3.5 3.8  CL 104 104  CO2 26 26  GLUCOSE 73 157*  BUN 24* 28*  CREATININE 0.93 0.87  CALCIUM 9.2 9.3    PT/INR: No results found for this basename: LABPROT,INR in the last 72 hours ABG    Component Value Date/Time   PHART 7.374 12/12/2011 2304   HCO3 25.0* 12/12/2011 2304   TCO2 26 12/12/2011 2304   ACIDBASEDEF 1.0 12/12/2011 2201   O2SAT 99.0 12/12/2011 2304   CBG (last 3)   Basename 12/21/11 0608 12/20/11 2025 12/20/11 1606  GLUCAP 116* 174* 179*    Assessment/Plan: S/P Procedure(s) (LRB): CORONARY ARTERY BYPASS GRAFTING (CABG) (N/A)  1. CV- NSR, mildly hypertensive, Lisinopril discontinued yesterday, due to intolerance when previously taken, will continue Lopressor 2. Pulm-  productive cough, improving now able to expectorate sputum, continue nebulizers, IS- CXR continues to show improvement, some atelectasis present, pleural effusions resolved 3. Sputum culture pending- on Levaquin  4. DM- CBGs controlled 5. Dispo- patient doing much better than yesterday, will continue Levaquin pending sputum culture results, patient wishes to be discharged home today will discuss with staff  LOS: 9 days    [name] 12/21/2011   Chart reviewed, patient examined, agree with above. He wants to wait until tomorrow to go home.

## 2011-12-21 NOTE — Progress Notes (Signed)
Physical Therapy Evaluation Patient Details Name: [name] MRN: [id number] DOB: 13-Nov-1940 Today's Date: 12/21/2011 Time: 4098-1191 PT Time Calculation (min): 35 min  PT Assessment / Plan / Recommendation Clinical Impression  Pt is 71 yo male s/p CABG who has improved remarkably just since yesterday.  Based on yesterday's status, would have recommended short term SNF.  However, based on pt's mobility this morning, am recommending home with HHPT.  However, since change has been recent, would like to see pt one more time tomorrow to practice stairs and further distance ambulation.  Will also check on pt this afternoon to make sure that his status is stable.  Pt has needed equipment already.    PT Assessment  Patient needs continued PT services    Follow Up Recommendations  Home health PT    Barriers to Discharge None      Equipment Recommendations  None recommended by PT    Recommendations for Other Services     Frequency Min 4X/week    Precautions / Restrictions Precautions Precautions: Sternal Precaution Comments: pt understands sternal precautions, educated on how to keep them with mobility in and out of bed Restrictions Weight Bearing Restrictions: No   Pertinent Vitals/Pain 3/10 chest pain, no intervention needed VSS      Mobility  Bed Mobility Bed Mobility: Rolling Left;Left Sidelying to Sit;Sitting - Scoot to [place] of Bed Rolling Left: 6: Modified independent (Device/Increase time) Left Sidelying to Sit: 6: Modified independent (Device/Increase time) Sitting - Scoot to Edge of Bed: 6: Modified independent (Device/Increase time) Details for Bed Mobility Assistance: vc's given to roll first all the way onto side to decrease pressure on sternum Transfers Transfers: Sit to Stand;Stand to Sit Sit to Stand: 6: Modified independent (Device/Increase time);From bed;From chair/3-in-1 Stand to Sit: 6: Modified independent (Device/Increase time);To bed;To  chair/3-in-1 Details for Transfer Assistance: pt able to perform sit to stand from multiple height surfaces with hands on knees or not using them at all.  Pt reports that he could not do this yesterday due to LE weakness but is much improved today Ambulation/Gait Ambulation/Gait Assistance: 6: Modified independent (Device/Increase time) Ambulation Distance (Feet): 375 Feet (125', 250') Assistive device: Rolling walker Ambulation/Gait Assistance Details: pt with appropriate pace and appropriately self-monitoring.  Discussed ambulating each hour with pt and discussed pt being able to ambulate independently in hall with RW Gait Pattern: Step-through pattern Gait velocity: slightly decreased Stairs: No Wheelchair Mobility Wheelchair Mobility: No    Exercises Other Exercises Other Exercises: ankle pumps, while in chair, pt verbalized understanding Other Exercises: discussed scapular retraction with arms by side to work on posture, pt demonstrated understanding   PT Diagnosis: Acute pain;Difficulty walking  PT Problem List: Decreased activity tolerance;Decreased mobility;Decreased knowledge of use of DME;Decreased knowledge of precautions;Pain PT Treatment Interventions: DME instruction;Gait training;Stair training;Functional mobility training;Therapeutic activities;Therapeutic exercise;Patient/family education   PT Goals Acute Rehab PT Goals PT Goal Formulation: With patient/family Time For Goal Achievement: 12/28/11 Potential to Achieve Goals: Good Pt will Ambulate: with modified independence;with rolling walker (>300' in one walk) PT Goal: Ambulate - Progress: Goal set today Pt will Go Up / Down Stairs: 3-5 stairs;with rail(s);with modified independence PT Goal: Up/Down Stairs - Progress: Goal set today  Visit Information  Last PT Received On: 12/21/11 Assistance Needed: +1    Subjective Data  Subjective: I feel so much better today than yesterday Patient Stated Goal: return home    Prior Functioning  Home Living Lives With: Spouse Available Help at Discharge: Family;Available PRN/intermittently  Type of Home: House Home Access: Stairs to enter [employer] of Steps: 5 Entrance Stairs-Rails: Right;Left Home Layout: One level Bathroom Shower/Tub: Engineer, manufacturing systems: Standard Bathroom Accessibility: Yes How Accessible: Accessible via walker Home Adaptive Equipment: Bedside commode/3-in-1;Shower chair with back;Walker - rolling Additional Comments: pt's wife just had CABG in January and is doing well now, walking with SPC.  But cannot provide physical assist for him.  Children live nearby but work during the day Prior Function Level of Independence: Independent with assistive device(s) Able to Take Stairs?: Yes Driving: Yes Vocation: Retired Musician: No difficulties    Cognition  Overall Cognitive Status: Appears within functional limits for tasks assessed/performed Arousal/Alertness: Awake/alert Orientation Level: Oriented X4 / Intact Behavior During Session: WFL for tasks performed    Extremity/Trunk Assessment Right Upper Extremity Assessment RUE ROM/Strength/Tone: [facility] for tasks assessed Left Upper Extremity Assessment LUE ROM/Strength/Tone: WFL for tasks assessed Right Lower Extremity Assessment RLE ROM/Strength/Tone: Within functional levels (hip flex, knee flex/ ext 4+/5) RLE Sensation: WFL - Light Touch RLE Coordination: WFL - gross motor Left Lower Extremity Assessment LLE ROM/Strength/Tone: Within functional levels (hip flex, knee flex/ext 4+/5) LLE Sensation: WFL - Light Touch LLE Coordination: WFL - gross motor Trunk Assessment Trunk Assessment: Normal   Balance Balance Balance Assessed: Yes Dynamic Standing Balance Dynamic Standing - Balance Support: No upper extremity supported;During functional activity Dynamic Standing - Level of Assistance: 6: Modified independent (Device/Increase time)  End  of Session PT - End of Session Equipment Utilized During Treatment: Gait belt Activity Tolerance: Patient tolerated treatment well Patient left: in chair;with call bell/phone within reach;with family/visitor present Nurse Communication: Mobility status  GP    Lyanne Co, PT  Acute Rehab Services  [phone]  Lyanne Co 12/21/2011, 9:10 AM

## 2011-12-22 LAB — GLUCOSE, CAPILLARY: Glucose-Capillary: 205 mg/dL — ABNORMAL HIGH (ref 70–99)

## 2011-12-22 LAB — CULTURE, RESPIRATORY W GRAM STAIN

## 2011-12-22 NOTE — Progress Notes (Signed)
10 Days Post-Op Procedure(s) (LRB): CORONARY ARTERY BYPASS GRAFTING (CABG) (N/A)  Subjective: [name] complains of low blood sugar this morning.  He states he feels weak due to his sugar being low  Objective: Vital signs in last 24 hours: Temp:  [97.8 F (36.6 C)-98.2 F (36.8 C)] 97.8 F (36.6 C) (09/01 0319) Pulse Rate:  [78-86] 78  (09/01 0319) Cardiac Rhythm:  [-] Normal sinus rhythm (08/31 0853) Resp:  [18] 18  (09/01 0319) BP: (123-136)/(76-78) 132/76 mmHg (09/01 0319) SpO2:  [95 %-98 %] 95 % (09/01 0503) Weight:  [200 lb 3.2 oz (90.81 kg)] 200 lb 3.2 oz (90.81 kg) (09/01 0319)  Intake/Output from previous day: 08/31 0701 - 09/01 0700 In: 480 [P.O.:480] Out: 700 [Urine:700]  General appearance: alert, cooperative and no distress Neurologic: intact Heart: regular rate and rhythm Lungs: rhonchi bilaterally Abdomen: soft, non-tender; bowel sounds normal; no masses,  no organomegaly Extremities: edema 1-2+ Wound: clean and dry  Lab Results:  Basename 12/21/11 1519  WBC 12.9*  HGB 8.8*  HCT 29.1*  PLT 437*   BMET:  Basename 12/21/11 1519 12/20/11 0500  NA 135 141  K 3.9 3.5  CL 99 104  CO2 23 26  GLUCOSE 162* 73  BUN 27* 24*  CREATININE 1.18 0.93  CALCIUM 9.2 9.2    PT/INR: No results found for this basename: LABPROT,INR in the last 72 hours ABG    Component Value Date/Time   PHART 7.374 12/12/2011 2304   HCO3 25.0* 12/12/2011 2304   TCO2 26 12/12/2011 2304   ACIDBASEDEF 1.0 12/12/2011 2201   O2SAT 99.0 12/12/2011 2304   CBG (last 3)   Basename 12/22/11 0558 12/21/11 2059 12/21/11 1611  GLUCAP 87 210* 148*    Assessment/Plan: S/P Procedure(s) (LRB): CORONARY ARTERY BYPASS GRAFTING (CABG) (N/A)  1. CV- NSR on lopressor 2. Sputum Culture- + for H. Parainfluenza- on Levaquin will continue sensitivities pending 3. Pulm- cough/atelectasis- continue nebs, IS 4. DM- CBGs moderately controlled, continue Lantus, Metformin 5. Volume Status- + LE edema,  patients weight is below admission weight, hold diuresis for now 6. Dispo- patient feeling week this morning, will reassess patient after morning rounds if feeling better will d/c home   LOS: 10 days    [name], Denny Peon 12/22/2011

## 2011-12-22 NOTE — Progress Notes (Signed)
12/22/2011 12:53 PM Nursing note Discharge avs form, rx, medications already taken today and those due this evening given and explained to patient and wife. Pt. And wife stated they have already viewed the discharge video #113 as well as received moving right along video. CTS were already removed and steri strips placed. Pt. And wife decided not to wait for DME shower chair that was ordered, stating that they would "borrow one from a family member" who was willing to let them use thiers. Follow up appointments, incision care, when to call MD, and activity/lifting restrictions also reviewed. D/c iv line. D/c tele. D/c home per orders.  Tyrelle Raczka, Blanchard Kelch

## 2011-12-26 ENCOUNTER — Encounter: Payer: Self-pay | Admitting: Surgery

## 2012-01-03 ENCOUNTER — Other Ambulatory Visit: Payer: Self-pay | Admitting: Surgery

## 2012-01-03 DIAGNOSIS — I2510 Atherosclerotic heart disease of native coronary artery without angina pectoris: Secondary | ICD-10-CM

## 2012-01-07 ENCOUNTER — Ambulatory Visit
Admission: RE | Admit: 2012-01-07 | Discharge: 2012-01-07 | Disposition: A | Discharge status: Home / Self Care | Payer: Medicare Other | Source: Ambulatory Visit | Attending: Surgery | Admitting: Surgery

## 2012-01-07 ENCOUNTER — Encounter: Payer: Self-pay | Admitting: Surgery

## 2012-01-07 ENCOUNTER — Ambulatory Visit (INDEPENDENT_AMBULATORY_CARE_PROVIDER_SITE_OTHER): Payer: Self-pay | Admitting: Surgery

## 2012-01-07 VITALS — BP 88/55 | HR 82 | Resp 16 | Ht 68.0 in | Wt 162.0 lb

## 2012-01-07 DIAGNOSIS — Z09 Encounter for follow-up examination after completed treatment for conditions other than malignant neoplasm: Secondary | ICD-10-CM

## 2012-01-07 DIAGNOSIS — I2510 Atherosclerotic heart disease of native coronary artery without angina pectoris: Secondary | ICD-10-CM

## 2012-01-07 DIAGNOSIS — Z951 Presence of aortocoronary bypass graft: Secondary | ICD-10-CM

## 2012-01-07 NOTE — Progress Notes (Signed)
[address]            [place] [address]          [phone]       HPI:  Patient returns for routine postoperative follow-up having undergone coronary bypass graft surgery x4 on 12/12/2011. The patient's early postoperative recovery while in the hospital was notable for a slow postoperative course due to to bronchitis with productive cough. This gradually resolved with antibiotics. Since hospital discharge the patient reports he has been feeling fairly well. He is walking daily without chest pain or shortness of breath. A home health nurse has been checking his blood pressure and his systolic blood pressure is usually between 100 and 110.   Current Outpatient Prescriptions  Medication Sig Dispense Refill  . ferrous sulfate 325 (65 FE) MG tablet Take 1 tablet (325 mg total) by mouth daily with breakfast.  30 tablet  1  . fish oil-omega-3 fatty acids 1000 MG capsule Take 1 g by mouth 2 (two) times daily.      . furosemide (LASIX) 40 MG tablet Take 40 mg by mouth every morning.       . gabapentin (NEURONTIN) 300 MG capsule Take 300 mg by mouth 2 (two) times daily.      Marland Kitchen guaiFENesin (MUCINEX) 600 MG 12 hr tablet Take 2 tablets (1,200 mg total) by mouth 2 (two) times daily.      . insulin aspart (NOVOLOG) 100 UNIT/ML injection Inject 4-5 Units into the skin 3 (three) times daily before meals. Per sliding scale      . insulin glargine (LANTUS) 100 UNIT/ML injection Inject 30 Units into the skin 2 (two) times daily.  10 mL    . Iron-Vitamin C (VITRON-C) 65-125 MG TABS Take 1 tablet by mouth every morning.       . lovastatin (MEVACOR) 20 MG tablet Take 40 mg by mouth at bedtime.      . meloxicam (MOBIC) 15 MG tablet Take 7.5 mg by mouth every morning.       . metFORMIN (GLUMETZA) 500 MG (MOD) 24 hr tablet Take 1,000 mg by mouth 2 (two) times daily with a meal.      . metoprolol tartrate (LOPRESSOR) 25 MG tablet Take 1 tablet (25 mg total) by mouth 2 (two)  times daily.  60 tablet  1  . omeprazole (PRILOSEC) 20 MG capsule Take 20 mg by mouth 2 (two) times daily.      . Potassium Aminobenzoate 500 MG CAPS Take 1 capsule by mouth every morning.       . traMADol (ULTRAM) 50 MG tablet Take 50 mg by mouth every 6 (six) hours as needed.        Physical Exam:  BP 88/55  Pulse 82  Resp 16  Ht 5\' 8"  (1.727 m)  Wt 162 lb (73.483 kg)  BMI 24.63 kg/m2  SpO2 96% He looks well. Cardiac exam shows regular rate and rhythm with normal heart sounds. Lung exam is clear. The chest incision is healing well and sternum is stable. His leg incision is healing well. There is mild bilateral lower leg and ankle edema.   Diagnostic Tests:  *RADIOLOGY REPORT*   Clinical Data: CABG in August 2013, follow-up   CHEST - 2 VIEW   Comparison: Chest x-ray of 12/21/2011   Findings: Aeration of the lung bases has improved slightly.  Mild  basilar atelectasis remains, and there is still a small left pleural effusion present.  Cardiomegaly is stable.  No bony abnormality is seen.   IMPRESSION: Slightly better aeration with decrease in basilar atelectasis. Small left effusion remains.     Original Report Authenticated By: Juline Patch, M.D.   Impression:  Overall I think he is making a good recovery following his surgery. His blood pressure is running a little low so I asked him to decrease his metoprolol to 12.5 mg twice daily. 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  Plan:  He will continue to followup with Dr. Lady Gary and will contact me if he develops any problems with his incisions.

## 2013-06-16 ENCOUNTER — Emergency Department: Payer: Self-pay | Admitting: Emergency Medicine

## 2013-11-26 ENCOUNTER — Ambulatory Visit: Payer: Self-pay | Admitting: Gastroenterology

## 2014-10-25 ENCOUNTER — Encounter: Payer: Self-pay | Admitting: *Deleted

## 2014-10-25 ENCOUNTER — Observation Stay
Admission: EM | Admit: 2014-10-25 | Discharge: 2014-10-27 | Disposition: A | Discharge status: Home / Self Care | Payer: Commercial Managed Care - HMO | Attending: Internal Medicine | Admitting: Internal Medicine

## 2014-10-25 DIAGNOSIS — K219 Gastro-esophageal reflux disease without esophagitis: Secondary | ICD-10-CM | POA: Insufficient documentation

## 2014-10-25 DIAGNOSIS — R079 Chest pain, unspecified: Secondary | ICD-10-CM

## 2014-10-25 DIAGNOSIS — N183 Chronic kidney disease, stage 3 (moderate): Secondary | ICD-10-CM | POA: Insufficient documentation

## 2014-10-25 DIAGNOSIS — Z833 Family history of diabetes mellitus: Secondary | ICD-10-CM | POA: Diagnosis not present

## 2014-10-25 DIAGNOSIS — IMO0002 Reserved for concepts with insufficient information to code with codable children: Secondary | ICD-10-CM

## 2014-10-25 DIAGNOSIS — Z794 Long term (current) use of insulin: Secondary | ICD-10-CM | POA: Insufficient documentation

## 2014-10-25 DIAGNOSIS — E1122 Type 2 diabetes mellitus with diabetic chronic kidney disease: Secondary | ICD-10-CM | POA: Insufficient documentation

## 2014-10-25 DIAGNOSIS — K5900 Constipation, unspecified: Secondary | ICD-10-CM | POA: Diagnosis not present

## 2014-10-25 DIAGNOSIS — I2582 Chronic total occlusion of coronary artery: Secondary | ICD-10-CM | POA: Insufficient documentation

## 2014-10-25 DIAGNOSIS — I2089 Other forms of angina pectoris: Secondary | ICD-10-CM | POA: Diagnosis present

## 2014-10-25 DIAGNOSIS — I252 Old myocardial infarction: Secondary | ICD-10-CM | POA: Diagnosis not present

## 2014-10-25 DIAGNOSIS — J45909 Unspecified asthma, uncomplicated: Secondary | ICD-10-CM | POA: Diagnosis not present

## 2014-10-25 DIAGNOSIS — Z79899 Other long term (current) drug therapy: Secondary | ICD-10-CM | POA: Insufficient documentation

## 2014-10-25 DIAGNOSIS — D649 Anemia, unspecified: Secondary | ICD-10-CM | POA: Insufficient documentation

## 2014-10-25 DIAGNOSIS — Z791 Long term (current) use of non-steroidal anti-inflammatories (NSAID): Secondary | ICD-10-CM | POA: Insufficient documentation

## 2014-10-25 DIAGNOSIS — E1140 Type 2 diabetes mellitus with diabetic neuropathy, unspecified: Secondary | ICD-10-CM | POA: Insufficient documentation

## 2014-10-25 DIAGNOSIS — E785 Hyperlipidemia, unspecified: Secondary | ICD-10-CM | POA: Insufficient documentation

## 2014-10-25 DIAGNOSIS — Z87891 Personal history of nicotine dependence: Secondary | ICD-10-CM | POA: Insufficient documentation

## 2014-10-25 DIAGNOSIS — N400 Enlarged prostate without lower urinary tract symptoms: Secondary | ICD-10-CM | POA: Diagnosis not present

## 2014-10-25 DIAGNOSIS — Z951 Presence of aortocoronary bypass graft: Secondary | ICD-10-CM | POA: Diagnosis not present

## 2014-10-25 DIAGNOSIS — I25710 Atherosclerosis of autologous vein coronary artery bypass graft(s) with unstable angina pectoris: Secondary | ICD-10-CM | POA: Diagnosis not present

## 2014-10-25 DIAGNOSIS — I200 Unstable angina: Secondary | ICD-10-CM | POA: Diagnosis present

## 2014-10-25 DIAGNOSIS — N179 Acute kidney failure, unspecified: Secondary | ICD-10-CM | POA: Insufficient documentation

## 2014-10-25 DIAGNOSIS — E1165 Type 2 diabetes mellitus with hyperglycemia: Secondary | ICD-10-CM | POA: Insufficient documentation

## 2014-10-25 DIAGNOSIS — I5022 Chronic systolic (congestive) heart failure: Secondary | ICD-10-CM | POA: Diagnosis not present

## 2014-10-25 DIAGNOSIS — I208 Other forms of angina pectoris: Secondary | ICD-10-CM | POA: Diagnosis present

## 2014-10-25 DIAGNOSIS — I25110 Atherosclerotic heart disease of native coronary artery with unstable angina pectoris: Principal | ICD-10-CM | POA: Insufficient documentation

## 2014-10-25 LAB — CBC
HEMATOCRIT: 37 % — AB (ref 40.0–52.0)
HEMOGLOBIN: 11.8 g/dL — AB (ref 13.0–18.0)
MCH: 27 pg (ref 26.0–34.0)
MCHC: 31.9 g/dL — ABNORMAL LOW (ref 32.0–36.0)
MCV: 84.8 fL (ref 80.0–100.0)
PLATELETS: 179 10*3/uL (ref 150–440)
RBC: 4.36 MIL/uL — AB (ref 4.40–5.90)
RDW: 14.3 % (ref 11.5–14.5)
WBC: 11.8 10*3/uL — ABNORMAL HIGH (ref 3.8–10.6)

## 2014-10-25 NOTE — ED Notes (Signed)
Pt to triage via wheelchair.  Pt reports chest pain since this morning.  Pain worsened tonight.  Pt states radiates into upper back.  Pt also is sob.  Nonsmoker.  No cough.  No fever.  No n/v/d/  No diaphoresis. Hx cabg 3years ago at [place]

## 2014-10-26 ENCOUNTER — Encounter: Payer: Self-pay | Admitting: Internal Medicine

## 2014-10-26 ENCOUNTER — Emergency Department: Payer: Commercial Managed Care - HMO

## 2014-10-26 ENCOUNTER — Encounter
Admission: EM | Disposition: A | Discharge status: Home / Self Care | Payer: Commercial Managed Care - HMO | Source: Home / Self Care | Attending: Emergency Medicine

## 2014-10-26 DIAGNOSIS — I200 Unstable angina: Secondary | ICD-10-CM | POA: Diagnosis present

## 2014-10-26 DIAGNOSIS — I2089 Other forms of angina pectoris: Secondary | ICD-10-CM | POA: Diagnosis present

## 2014-10-26 DIAGNOSIS — I208 Other forms of angina pectoris: Secondary | ICD-10-CM | POA: Diagnosis present

## 2014-10-26 HISTORY — PX: CARDIAC CATHETERIZATION: SHX172

## 2014-10-26 LAB — BASIC METABOLIC PANEL
ANION GAP: 11 (ref 5–15)
BUN: 35 mg/dL — ABNORMAL HIGH (ref 6–20)
CALCIUM: 8.7 mg/dL — AB (ref 8.9–10.3)
CO2: 24 mmol/L (ref 22–32)
CREATININE: 2.01 mg/dL — AB (ref 0.61–1.24)
Chloride: 100 mmol/L — ABNORMAL LOW (ref 101–111)
GFR calc Af Amer: 36 mL/min — ABNORMAL LOW (ref >60)
GFR calc non Af Amer: 31 mL/min — ABNORMAL LOW (ref >60)
GLUCOSE: 326 mg/dL — AB (ref 65–99)
Potassium: 4 mmol/L (ref 3.5–5.1)
SODIUM: 135 mmol/L (ref 135–145)

## 2014-10-26 LAB — GLUCOSE, CAPILLARY
Glucose-Capillary: 233 mg/dL — ABNORMAL HIGH (ref 65–99)
Glucose-Capillary: 228 mg/dL — ABNORMAL HIGH (ref 65–99)
Glucose-Capillary: 358 mg/dL — ABNORMAL HIGH (ref 65–99)

## 2014-10-26 LAB — BRAIN NATRIURETIC PEPTIDE: B NATRIURETIC PEPTIDE 5: 53 pg/mL (ref 0.0–100.0)

## 2014-10-26 LAB — TROPONIN I
Troponin I: <0.03 ng/mL (ref <0.031)
Troponin I: <0.03 ng/mL (ref <0.031)
Troponin I: 0.03 ng/mL (ref <0.031)
Troponin I: 0.03 ng/mL (ref <0.031)

## 2014-10-26 LAB — TSH: TSH: 3.091 u[IU]/mL (ref 0.350–4.500)

## 2014-10-26 LAB — HEMOGLOBIN A1C: Hgb A1c MFr Bld: 11.5 % — ABNORMAL HIGH (ref 4.0–6.0)

## 2014-10-26 SURGERY — LEFT HEART CATH
Anesthesia: Moderate Sedation

## 2014-10-26 MED ORDER — INSULIN ASPART 100 UNIT/ML ~~LOC~~ SOLN
0.0000 [IU] | Freq: Every day | SUBCUTANEOUS | Status: DC
  Administered 2014-10-26: 5 [IU] via SUBCUTANEOUS
  Filled 2014-10-26: qty 5

## 2014-10-26 MED ORDER — FUROSEMIDE 40 MG PO TABS: 40.0000 mg | ORAL_TABLET | Freq: Every morning | ORAL | Status: DC

## 2014-10-26 MED ORDER — SODIUM CHLORIDE 0.9 % IV SOLN: 250.0000 mL | INTRAVENOUS | Status: DC | PRN

## 2014-10-26 MED ORDER — FENTANYL CITRATE (PF) 100 MCG/2ML IJ SOLN
INTRAMUSCULAR | Status: DC | PRN
  Administered 2014-10-26: 50 ug via INTRAVENOUS

## 2014-10-26 MED ORDER — ACETAMINOPHEN 325 MG PO TABS: 650.0000 mg | ORAL_TABLET | Freq: Four times a day (QID) | ORAL | Status: DC | PRN

## 2014-10-26 MED ORDER — ONDANSETRON HCL 4 MG/2ML IJ SOLN: 4.0000 mg | Freq: Four times a day (QID) | INTRAMUSCULAR | Status: DC | PRN

## 2014-10-26 MED ORDER — ONDANSETRON HCL 4 MG PO TABS: 4.0000 mg | ORAL_TABLET | Freq: Four times a day (QID) | ORAL | Status: DC | PRN

## 2014-10-26 MED ORDER — FE FUMARATE-B12-VIT C-FA-IFC PO CAPS
1.0000 | ORAL_CAPSULE | Freq: Every morning | ORAL | Status: DC
  Administered 2014-10-26 – 2014-10-27 (×2): 1 via ORAL
  Filled 2014-10-26 (×3): qty 1

## 2014-10-26 MED ORDER — SODIUM CHLORIDE 0.9 % IJ SOLN
3.0000 mL | Freq: Two times a day (BID) | INTRAMUSCULAR | Status: DC
  Administered 2014-10-26 – 2014-10-27 (×3): 3 mL via INTRAVENOUS

## 2014-10-26 MED ORDER — NITROGLYCERIN 2 % TD OINT
1.0000 [in_us] | TOPICAL_OINTMENT | Freq: Once | TRANSDERMAL | Status: AC
Start: 2014-10-26 — End: 2014-10-26
  Administered 2014-10-26: 1 [in_us] via TOPICAL

## 2014-10-26 MED ORDER — INSULIN GLARGINE 100 UNIT/ML ~~LOC~~ SOLN
20.0000 [IU] | Freq: Every day | SUBCUTANEOUS | Status: DC
  Administered 2014-10-26: 20 [IU] via SUBCUTANEOUS
  Filled 2014-10-26 (×2): qty 0.2

## 2014-10-26 MED ORDER — ACETAMINOPHEN 325 MG PO TABS: 650.0000 mg | ORAL_TABLET | ORAL | Status: DC | PRN

## 2014-10-26 MED ORDER — OMEGA-3-ACID ETHYL ESTERS 1 G PO CAPS
1.0000 g | ORAL_CAPSULE | Freq: Two times a day (BID) | ORAL | Status: DC
  Administered 2014-10-26 – 2014-10-27 (×3): 1 g via ORAL
  Filled 2014-10-26 (×5): qty 1

## 2014-10-26 MED ORDER — SODIUM CHLORIDE 0.9 % WEIGHT BASED INFUSION
3.0000 mL/kg/h | INTRAVENOUS | Status: DC
  Administered 2014-10-26: 3 mL/kg/h via INTRAVENOUS

## 2014-10-26 MED ORDER — MORPHINE SULFATE 4 MG/ML IJ SOLN
INTRAMUSCULAR | Status: AC
  Administered 2014-10-26: 4 mg via INTRAVENOUS
  Filled 2014-10-26: qty 1

## 2014-10-26 MED ORDER — MIDAZOLAM HCL 2 MG/2ML IJ SOLN
INTRAMUSCULAR | Status: DC | PRN
  Administered 2014-10-26: 1 mg via INTRAVENOUS

## 2014-10-26 MED ORDER — ISOSORBIDE MONONITRATE ER 30 MG PO TB24: 30.0000 mg | ORAL_TABLET | Freq: Every day | ORAL | Status: DC

## 2014-10-26 MED ORDER — ACETAMINOPHEN 650 MG RE SUPP: 650.0000 mg | Freq: Four times a day (QID) | RECTAL | Status: DC | PRN

## 2014-10-26 MED ORDER — FERROUS SULFATE 325 (65 FE) MG PO TABS
325.0000 mg | ORAL_TABLET | Freq: Every day | ORAL | Status: DC
  Administered 2014-10-26 – 2014-10-27 (×2): 325 mg via ORAL
  Filled 2014-10-26 (×2): qty 1

## 2014-10-26 MED ORDER — ASPIRIN 81 MG PO CHEW
81.0000 mg | CHEWABLE_TABLET | ORAL | Status: AC
  Administered 2014-10-27: 81 mg via ORAL
  Filled 2014-10-26: qty 1

## 2014-10-26 MED ORDER — ONDANSETRON HCL 4 MG/2ML IJ SOLN
4.0000 mg | Freq: Once | INTRAMUSCULAR | Status: AC
  Administered 2014-10-26: 4 mg via INTRAVENOUS

## 2014-10-26 MED ORDER — SODIUM CHLORIDE 0.9 % IV SOLN: INTRAVENOUS | Status: DC

## 2014-10-26 MED ORDER — MORPHINE SULFATE 2 MG/ML IJ SOLN
2.0000 mg | INTRAMUSCULAR | Status: DC | PRN
  Administered 2014-10-26 – 2014-10-27 (×2): 2 mg via INTRAVENOUS
  Filled 2014-10-26 (×2): qty 1

## 2014-10-26 MED ORDER — ASPIRIN 81 MG PO CHEW
324.0000 mg | CHEWABLE_TABLET | Freq: Once | ORAL | Status: AC
  Administered 2014-10-26: 324 mg via ORAL

## 2014-10-26 MED ORDER — DOCUSATE SODIUM 100 MG PO CAPS
100.0000 mg | ORAL_CAPSULE | Freq: Two times a day (BID) | ORAL | Status: DC
  Administered 2014-10-26 – 2014-10-27 (×3): 100 mg via ORAL
  Filled 2014-10-26 (×3): qty 1

## 2014-10-26 MED ORDER — MIDAZOLAM HCL 2 MG/2ML IJ SOLN
INTRAMUSCULAR | Status: AC
  Filled 2014-10-26: qty 2

## 2014-10-26 MED ORDER — NITROGLYCERIN 2 % TD OINT
TOPICAL_OINTMENT | TRANSDERMAL | Status: AC
  Administered 2014-10-26: 1 [in_us] via TOPICAL
  Filled 2014-10-26: qty 1

## 2014-10-26 MED ORDER — GABAPENTIN 300 MG PO CAPS
300.0000 mg | ORAL_CAPSULE | Freq: Two times a day (BID) | ORAL | Status: DC
  Administered 2014-10-26 – 2014-10-27 (×3): 300 mg via ORAL
  Filled 2014-10-26 (×3): qty 1

## 2014-10-26 MED ORDER — ASPIRIN 81 MG PO CHEW
CHEWABLE_TABLET | ORAL | Status: AC
  Administered 2014-10-26: 324 mg via ORAL
  Filled 2014-10-26: qty 4

## 2014-10-26 MED ORDER — SODIUM CHLORIDE 0.9 % IJ SOLN: 3.0000 mL | INTRAMUSCULAR | Status: DC | PRN

## 2014-10-26 MED ORDER — ASPIRIN EC 325 MG PO TBEC
325.0000 mg | DELAYED_RELEASE_TABLET | Freq: Every day | ORAL | Status: DC
  Administered 2014-10-26 – 2014-10-27 (×2): 325 mg via ORAL
  Filled 2014-10-26 (×2): qty 1

## 2014-10-26 MED ORDER — HEPARIN SODIUM (PORCINE) 5000 UNIT/ML IJ SOLN
5000.0000 [IU] | Freq: Three times a day (TID) | INTRAMUSCULAR | Status: DC
Start: 2014-10-26 — End: 2014-10-27
  Administered 2014-10-26 – 2014-10-27 (×4): 5000 [IU] via SUBCUTANEOUS
  Filled 2014-10-26 (×4): qty 1

## 2014-10-26 MED ORDER — PRAVASTATIN SODIUM 20 MG PO TABS
40.0000 mg | ORAL_TABLET | Freq: Every day | ORAL | Status: DC
  Administered 2014-10-26: 40 mg via ORAL
  Filled 2014-10-26: qty 2

## 2014-10-26 MED ORDER — ISOSORBIDE MONONITRATE ER 30 MG PO TB24
30.0000 mg | ORAL_TABLET | Freq: Every day | ORAL | Status: DC
  Administered 2014-10-26 – 2014-10-27 (×2): 30 mg via ORAL
  Filled 2014-10-26 (×2): qty 1

## 2014-10-26 MED ORDER — CARVEDILOL 12.5 MG PO TABS
12.5000 mg | ORAL_TABLET | Freq: Two times a day (BID) | ORAL | Status: DC
  Administered 2014-10-26 – 2014-10-27 (×2): 12.5 mg via ORAL
  Filled 2014-10-26 (×2): qty 1

## 2014-10-26 MED ORDER — MORPHINE SULFATE 4 MG/ML IJ SOLN
4.0000 mg | Freq: Once | INTRAMUSCULAR | Status: AC
  Administered 2014-10-26: 4 mg via INTRAVENOUS

## 2014-10-26 MED ORDER — HEPARIN (PORCINE) IN NACL 2-0.9 UNIT/ML-% IJ SOLN
INTRAMUSCULAR | Status: AC
  Filled 2014-10-26: qty 1000

## 2014-10-26 MED ORDER — IOHEXOL 300 MG/ML  SOLN
INTRAMUSCULAR | Status: DC | PRN
  Administered 2014-10-26: 20 mL via INTRA_ARTERIAL
  Administered 2014-10-26: 30 mL via INTRA_ARTERIAL
  Administered 2014-10-26: 100 mL via INTRA_ARTERIAL

## 2014-10-26 MED ORDER — FENTANYL CITRATE (PF) 100 MCG/2ML IJ SOLN
INTRAMUSCULAR | Status: AC
  Filled 2014-10-26: qty 2

## 2014-10-26 MED ORDER — SODIUM CHLORIDE 0.9 % IJ SOLN: 3.0000 mL | Freq: Two times a day (BID) | INTRAMUSCULAR | Status: DC

## 2014-10-26 MED ORDER — CARVEDILOL 12.5 MG PO TABS: 12.5000 mg | ORAL_TABLET | Freq: Two times a day (BID) | ORAL | Status: DC

## 2014-10-26 MED ORDER — NITROGLYCERIN 0.4 MG SL SUBL
0.4000 mg | SUBLINGUAL_TABLET | SUBLINGUAL | Status: DC | PRN
  Administered 2014-10-26: 0.4 mg via SUBLINGUAL

## 2014-10-26 MED ORDER — ONDANSETRON HCL 4 MG/2ML IJ SOLN
INTRAMUSCULAR | Status: AC
  Administered 2014-10-26: 4 mg via INTRAVENOUS
  Filled 2014-10-26: qty 2

## 2014-10-26 MED ORDER — INSULIN ASPART 100 UNIT/ML ~~LOC~~ SOLN
0.0000 [IU] | Freq: Three times a day (TID) | SUBCUTANEOUS | Status: DC
  Administered 2014-10-26: 5 [IU] via SUBCUTANEOUS
  Administered 2014-10-27 (×2): 15 [IU] via SUBCUTANEOUS
  Filled 2014-10-26 (×2): qty 15
  Filled 2014-10-26: qty 5

## 2014-10-26 SURGICAL SUPPLY — 12 items
CATH INFINITI 5 FR IM (CATHETERS) ×3 IMPLANT
CATH INFINITI 5FR ANG PIGTAIL (CATHETERS) ×3 IMPLANT
CATH INFINITI 5FR JL4 (CATHETERS) ×3 IMPLANT
CATH INFINITI 5FR JL5 (CATHETERS) ×3 IMPLANT
CATH INFINITI JR4 5F (CATHETERS) ×3 IMPLANT
DEVICE CLOSURE MYNXGRIP 5F (Vascular Products) ×3 IMPLANT
KIT MANI 3VAL PERCEP (MISCELLANEOUS) ×3 IMPLANT
NEEDLE PERC 18GX7CM (NEEDLE) ×3 IMPLANT
PACK CARDIAC CATH (CUSTOM PROCEDURE TRAY) ×3 IMPLANT
SHEATH AVANTI 5FR X 11CM (SHEATH) ×3 IMPLANT
WIRE EMERALD 3MM-J .035X150CM (WIRE) ×3 IMPLANT
WIRE EMERALD 3MM-J .035X260CM (WIRE) ×3 IMPLANT

## 2014-10-26 NOTE — Progress Notes (Signed)
[name] is a 74 y.o. male   SUBJECTIVE:  Patient with known coronary artery disease post CABG has had progressive angina for a number of weeks. Presents today with unstable angina, still 3/10 substernal chest pain. First troponin negative. Multiple cardiac risk factors.  ______________________________________________________________________  ROS: Review of systems is unremarkable for any active cardiac,respiratory, GI, GU, hematologic, neurologic or psychiatric systems, 10 systems reviewed.  Marland Kitchen aspirin EC  325 mg Oral Daily  . docusate sodium  100 mg Oral BID  . ferrous [id number] C-folic acid  1 capsule Oral q morning - 10a  . ferrous sulfate  325 mg Oral Q breakfast  . gabapentin  300 mg Oral BID  . heparin  5,000 Units Subcutaneous 3 times per day  . insulin aspart  0-15 Units Subcutaneous TID WC  . insulin aspart  0-5 Units Subcutaneous QHS  . insulin glargine  20 Units Subcutaneous QHS  . omega-3 acid ethyl esters  1 g Oral BID  . pravastatin  40 mg Oral q1800  . sodium chloride  3 mL Intravenous Q12H   acetaminophen **OR** acetaminophen, morphine injection, nitroGLYCERIN, ondansetron **OR** ondansetron (ZOFRAN) IV   Past Medical History  Diagnosis Date  . Diabetes mellitus   . Hyperlipidemia   . H/O cardiac catheterization 06/03/06,12/03/11  . BPH (benign prostatic hypertrophy)   . Anxiety   . Syncope     LAUGHING INDUCED  . Myocardial infarction     x 2  . CHF (congestive heart failure)   . CAD (coronary artery disease) 06/03/06    cypher 2.5 x 46mm DES for 80% mid RCA sees Dr. Ubaldo Glassing  . Asthma     as a child  . Sleep apnea     hx of  . Kidney stone     hx of  . GERD (gastroesophageal reflux disease)     on medication for  . 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 Arthritis   . Anemia     taking iron  . Cataract, bilateral   . Neuromuscular disorder     diabetic neuropathy    Past Surgical History  Procedure Laterality Date   . Spine surgery  9/05    CERVICAL LAMINECTOMY  . Cervical herniated    . Eye surgery      as a child  . Coronary artery bypass graft  12/12/2011    Procedure: CORONARY ARTERY BYPASS GRAFTING (CABG);  Surgeon: Gaye Pollack, MD;  Location: [facility];  Service: Open Heart Surgery;  Laterality: N/A;  coronary artery bypass graft times four using left internal mammary artery, right leg greater saphenous vein harvested endoscopically    PHYSICAL EXAM:  BP 169/76 mmHg  Pulse 73  Temp(Src) 98.5 F (36.9 C) (Oral)  Resp 18  Ht 5\' 8"  (1.727 m)  Wt 92.126 kg (203 lb 1.6 oz)  BMI 30.89 kg/m2  SpO2 98%  Wt Readings from Last 3 Encounters:  10/26/14 92.126 kg (203 lb 1.6 oz)  01/07/12 73.483 kg (162 lb)  12/22/11 90.81 kg (200 lb 3.2 oz)           BP Readings from Last 3 Encounters:  10/26/14 169/76  01/07/12 88/55  12/22/11 137/58    Constitutional: NAD Neck: supple, no thyromegaly Respiratory: CTA, no rales or wheezes Cardiovascular: RRR, no murmur, no gallop Abdomen: soft, good BS, nontender Extremities: no edema Neuro: alert and oriented, no focal motor or sensory deficits  ASSESSMENT/PLAN:  Labs and imaging studies were reviewed  Unstable angina-troponins pending, progressive angina to unstable angina by history,needs heart catheterization, cardiology consult pending, creatinine 2.0, antianginals maximized, heparin I DDM-currently on basal insulin plus sliding-scale CKG-baseline creatinine 2.0

## 2014-10-26 NOTE — ED Notes (Signed)
Patient reports is still having chest pain. MD made aware.

## 2014-10-26 NOTE — Progress Notes (Addendum)
Took over care for [name] at 15:20, pt is resting bed quietly, denies pain, incision site and dry and intact. Previous nurse reports that physicians are aware of elevated blood pressure throughout shift.

## 2014-10-26 NOTE — Consult Note (Signed)
[facility]  CARDIOLOGY CONSULT NOTE  Patient ID: [name] MRN: [id number] DOB/AGE: [id number] y.o.  Admit date: 10/25/2014 Referring Physician Emily Filbert M.D. Primary Physician Emily Filbert M.D. Primary Cardiologist Jordan Hawks M.D. Reason for Consultation unstable angina  HPI: 74 year old gentleman with known coronary artery disease status post prior coronary stents and coronary artery bypass graft surgery 4 2013. Patient reports he had a recent stress test 2 months ago which was abnormal. The patient's been experiencing increasing episodes of substernal chest pain. The patient describes substernal chest discomfort, described as a pressure sensation, with radiation to his left arm and neck. The patient presented to [facility] emergency room after prolonged episode of chest discomfort. EKG was nondiagnostic. Initial troponin is negative. She'll labs were notable for elevated creatinine of 2.0.  Review of systems complete and found to be negative unless listed above     Past Medical History  Diagnosis Date  . Diabetes mellitus   . Hyperlipidemia   . H/O cardiac catheterization 06/03/06,12/03/11  . BPH (benign prostatic hypertrophy)   . Anxiety   . Syncope     LAUGHING INDUCED  . Myocardial infarction     x 2  . CHF (congestive heart failure)   . CAD (coronary artery disease) 06/03/06    cypher 2.5 x 26mm DES for 80% mid RCA sees Dr. Ubaldo Glassing  . Asthma     as a child  . Sleep apnea     hx of  . Kidney stone     hx of  . GERD (gastroesophageal reflux disease)     on medication for  . 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 Arthritis   . Anemia     taking iron  . Cataract, bilateral   . Neuromuscular disorder     diabetic neuropathy    Past Surgical History  Procedure Laterality Date  . Spine surgery  9/05    CERVICAL LAMINECTOMY  . Cervical herniated    . Eye surgery      as a child  . Coronary artery bypass graft  12/12/2011    Procedure: CORONARY  ARTERY BYPASS GRAFTING (CABG);  Surgeon: Gaye Pollack, MD;  Location: [place];  Service: Open Heart Surgery;  Laterality: N/A;  coronary artery bypass graft times four using left internal mammary artery, right leg greater saphenous vein harvested endoscopically    Prescriptions prior to admission  Medication Sig Dispense Refill Last Dose  . aspirin EC 81 MG tablet Take 81 mg by mouth daily.     . metoprolol tartrate (LOPRESSOR) 25 MG tablet Take 25 mg by mouth 2 (two) times daily.     Marland Kitchen torsemide (DEMADEX) 20 MG tablet Take 40 mg by mouth daily.     . ferrous sulfate 325 (65 FE) MG tablet Take 1 tablet (325 mg total) by mouth daily with breakfast. 30 tablet 1 Taking  . fish oil-omega-3 fatty acids 1000 MG capsule Take 1 g by mouth 2 (two) times daily.   Taking  . gabapentin (NEURONTIN) 300 MG capsule Take 300 mg by mouth 2 (two) times daily.   Taking  . insulin aspart (NOVOLOG) 100 UNIT/ML injection Inject 4-5 Units into the skin 3 (three) times daily before meals. Per sliding scale   Taking  . insulin glargine (LANTUS) 100 UNIT/ML injection Inject 30 Units into the skin 2 (two) times daily. 10 mL  Taking  . Iron-Vitamin C (VITRON-C) 65-125 MG TABS  Take 1 tablet by mouth every morning.    Taking  . lovastatin (MEVACOR) 20 MG tablet Take 40 mg by mouth at bedtime.   Taking  . meloxicam (MOBIC) 15 MG tablet Take 7.5 mg by mouth every morning.    Taking  . omeprazole (PRILOSEC) 20 MG capsule Take 20 mg by mouth 2 (two) times daily.   Taking  . traMADol (ULTRAM) 50 MG tablet Take 50 mg by mouth every 6 (six) hours as needed.   Taking   History   Social History  . Marital Status: Married    Spouse Name: N/A  . Number of Children: N/A  . Years of Education: N/A   Occupational History  . Not on file.   Social History Main Topics  . Smoking status: Former Smoker    Types: Cigarettes, Cigars    Quit date: 12/03/1977  . Smokeless tobacco: Never Used  . Alcohol Use: No  . Drug Use: No  .  Sexual Activity: Not on file   Other Topics Concern  . Not on file   Social History Narrative   Lives with his wife    Family History  Problem Relation Age of Onset  . Diabetes Mellitus II Mother     Also Brother and sister  . Leukemia Father       Review of systems complete and found to be negative unless listed above      PHYSICAL EXAM  General: Well developed, well nourished, in no acute distress HEENT:  Normocephalic and atramatic Neck:  No JVD.  Lungs: Clear bilaterally to auscultation and percussion. Heart: HRRR . Normal S1 and S2 without gallops or murmurs.  Abdomen: Bowel sounds are positive, abdomen soft and non-tender  Msk:  Back normal, normal gait. Normal strength and tone for age. Extremities: No clubbing, cyanosis or edema.   Neuro: Alert and oriented X 3. Psych:  Good affect, responds appropriately  Labs:   Lab Results  Component Value Date   WBC 11.8* 10/25/2014   HGB 11.8* 10/25/2014   HCT 37.0* 10/25/2014   MCV 84.8 10/25/2014   PLT 179 10/25/2014    Recent Labs Lab 10/25/14 2326  NA 135  K 4.0  CL 100*  CO2 24  BUN 35*  CREATININE 2.01*  CALCIUM 8.7*  GLUCOSE 326*   Lab Results  Component Value Date   TROPONINI <0.03 10/25/2014   No results found for: CHOL No results found for: HDL No results found for: LDLCALC No results found for: TRIG No results found for: CHOLHDL No results found for: LDLDIRECT    Radiology: Dg Chest 1 View  10/26/2014   CLINICAL DATA:  Acute onset of generalized chest pain, radiating to the upper back. Shortness of breath. Initial encounter.  EXAM: CHEST  1 VIEW  COMPARISON:  Chest radiograph from 01/07/2012  FINDINGS: The lungs are well-aerated. Mild bibasilar atelectasis is noted. There is no evidence of pleural effusion or pneumothorax.  The cardiomediastinal silhouette is enlarged. The patient is status post median sternotomy. No acute osseous abnormalities are seen. Cervical spinal fusion hardware is  partially imaged.  IMPRESSION: Mild bibasilar atelectasis noted.  Cardiomegaly seen.   Electronically Signed   By: Garald Balding M.D.   On: 10/26/2014 01:27    EKG: Sinus rhythm without acute ischemic ST-T wave changes  ASSESSMENT AND PLAN:   74 year old gentleman with known coronary artery disease, status post prior coronary stents and coronary artery bypass graft surgery with progressive episodes of chest pain with  or without exertion. G nondiagnostic. Patient initially ruled out for myocardial infarction. She does have elevated creatinine.  Recommendations  1. Continue current medication 2. Stable cardiac catheterization with selective coronary arteriography. The risks, benefits alternatives were explained to the patient and informed consent was obtained.  SignedIsaias Cowman MD,PhD, [facility] 10/26/2014, 7:28 AM

## 2014-10-26 NOTE — ED Notes (Signed)
Patient present to ED with complaint of mid chest pain radiating to his back, reports pain began yesterday. Patient denies any heavy lifting or known injury. Patient denies pain radiating anywhere. Patient has history of 2-3 heart attack and bypass done in 2013. Patient reports short of breath with exertion and at rest, dizziness, and headache. Patient denies abdominal pain and fever. Patient alert and oriented, calm, cooperative, respirations even and unlabored. Call bell within reach, family at bedside.

## 2014-10-26 NOTE — ED Notes (Signed)
Patient began to appear pale after 0.4 mg nitroglycerin tablet was given, states "I feel funny..I feel like I'm going to pass out." Patient able to follow commands, speaking clearly. MD made aware.

## 2014-10-26 NOTE — Progress Notes (Signed)
Per Dr. Lorinda Creed okay to change diet to heart healthy diet via verbal order

## 2014-10-26 NOTE — ED Provider Notes (Signed)
[facility] Emergency Department Provider Note ___________________________________________  Time seen: Approximately 12:20 AM  I have reviewed the triage vital signs and the nursing notes.   HISTORY  Chief Complaint Chest Pain  HPI [name] is a 74 y.o. male who is complaining that he started having chest pain last evening and has continued throughout the day. Patient states his pain radiates into his back and down into his stomach. Patient has had long history of heart disease and which he has had stents prior to bypass surgery and has a long-time history of congestive heart failure. Patient states that this pain has not eased up and it's been atypical but has been in the area where he has had previous heart pain. Patient denies any associated nausea, sweating, or recent cough or cold symptoms. Patient has had no vomiting or diarrhea. Patient states that he always has some mild shortness of breath but he has definitely slightly worsened. Patient states his pain on scale of 0-10 is about a 5 at this time. Patient states it feels more like pressure to the anterior chest but then it radiates into his back and down into his stomach. Nothing seems to make it better or worse.   Past Medical History  Diagnosis Date  . Diabetes mellitus   . Hyperlipidemia   . H/O cardiac catheterization 06/03/06,12/03/11  . BPH (benign prostatic hypertrophy)   . Anxiety   . Syncope     LAUGHING INDUCED  . Myocardial infarction     x 2  . CHF (congestive heart failure)   . CAD (coronary artery disease) 06/03/06    cypher 2.5 x 4mm DES for 80% mid RCA sees Dr. Ubaldo Glassing  . Asthma     as a child  . Sleep apnea     hx of  . Kidney stone     hx of  . GERD (gastroesophageal reflux disease)     on medication for  . 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 Arthritis   . Anemia     taking iron  . Cataract, bilateral   . Neuromuscular disorder     diabetic neuropathy     Patient Active Problem List   Diagnosis Date Noted  . 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 Diabetes mellitus   . Hyperlipidemia   . CAD (coronary artery disease)   . Sleep apnea   . Asthma   . BPH (benign prostatic hypertrophy)   . Anxiety   . Syncope     Past Surgical History  Procedure Laterality Date  . Spine surgery  9/05    CERVICAL LAMINECTOMY  . Cervical herniated    . Eye surgery      as a child  . Coronary artery bypass graft  12/12/2011    Procedure: CORONARY ARTERY BYPASS GRAFTING (CABG);  Surgeon: Gaye Pollack, MD;  Location: [place];  Service: Open Heart Surgery;  Laterality: N/A;  coronary artery bypass graft times four using left internal mammary artery, right leg greater saphenous vein harvested endoscopically    Current Outpatient Rx  Name  Route  Sig  Dispense  Refill  . EXPIRED: ferrous sulfate 325 (65 FE) MG tablet   Oral   Take 1 tablet (325 mg total) by mouth daily with breakfast.   30 tablet   1   . fish oil-omega-3 fatty acids 1000 MG capsule   Oral   Take 1 g by  mouth 2 (two) times daily.         . furosemide (LASIX) 40 MG tablet   Oral   Take 40 mg by mouth every morning.          . gabapentin (NEURONTIN) 300 MG capsule   Oral   Take 300 mg by mouth 2 (two) times daily.         . insulin aspart (NOVOLOG) 100 UNIT/ML injection   Subcutaneous   Inject 4-5 Units into the skin 3 (three) times daily before meals. Per sliding scale         . insulin glargine (LANTUS) 100 UNIT/ML injection   Subcutaneous   Inject 30 Units into the skin 2 (two) times daily.   10 mL      . Iron-Vitamin C (VITRON-C) 65-125 MG TABS   Oral   Take 1 tablet by mouth every morning.          . lovastatin (MEVACOR) 20 MG tablet   Oral   Take 40 mg by mouth at bedtime.         . meloxicam (MOBIC) 15 MG tablet   Oral   Take 7.5 mg by mouth every morning.          . metFORMIN (GLUMETZA) 500 MG (MOD) 24 hr tablet   Oral   Take 1,000 mg by mouth 2 (two)  times daily with a meal.         . omeprazole (PRILOSEC) 20 MG capsule   Oral   Take 20 mg by mouth 2 (two) times daily.         . Potassium Aminobenzoate 500 MG CAPS   Oral   Take 1 capsule by mouth every morning.          . traMADol (ULTRAM) 50 MG tablet   Oral   Take 50 mg by mouth every 6 (six) hours as needed.           Allergies Zithromax and Lisinopril  No family history on file.  Social History History  Substance Use Topics  . Smoking status: Former Smoker    Types: Cigarettes, Cigars    Quit date: 12/03/1977  . Smokeless tobacco: Never Used  . Alcohol Use: No    Review of Systems Constitutional: No fever/chills Eyes: No visual changes. ENT: No sore throat. Cardiovascular: Positive for chest pain and shortness of breath. Respiratory: Positive for shortness of breath. Patient has no palpable chest wall tenderness. Gastrointestinal: Patient with some mild tenderness in the supraumbilical area..  No nausea, no vomiting.  No diarrhea.  No constipation. Genitourinary: Negative for dysuria. Musculoskeletal: Negative for back pain. Patient with mild edema on his lower legs bilaterally. Skin: Negative for rash. Neurological: Negative for headaches, focal weakness or numbness.  10-point ROS otherwise negative.  ____________________________________________   PHYSICAL EXAM:  VITAL SIGNS: ED Triage Vitals  Enc Vitals Group     BP 10/25/14 2328 168/66 mmHg     Pulse Rate 10/25/14 2328 77     Resp 10/25/14 2328 20     Temp 10/25/14 2328 98.6 F (37 C)     Temp Source 10/25/14 2328 Oral     SpO2 10/25/14 2328 96 %     Weight 10/25/14 2322 210 lb (95.255 kg)     Height 10/25/14 2322 5\' 8"  (1.727 m)     Head Cir --      Peak Flow --      Pain Score 10/25/14 2324 8  Pain Loc --      Pain Edu? --      Excl. in [place]? --     Constitutional: Alert and oriented. Well appearing and in mild distress secondary to his shortness of breath and chest  pain. Eyes: Conjunctivae are normal. PERRL. EOMI. Head: Atraumatic. Nose: No congestion/rhinnorhea. Mouth/Throat: Mucous membranes are moist.  Oropharynx non-erythematous. Neck: No stridor.   Cardiovascular: Normal rate, regular rhythm. Grossly normal heart sounds.  Good peripheral circulation. Respiratory: Patient is mildly tachypneic.  No retractions. Lungs with rales at the bases bilaterally, but good air exchange. Gastrointestinal: Soft and nontender. No distention. No abdominal bruits. No CVA tenderness. Musculoskeletal: No lower extremity tenderness nor edema.  No joint effusions. Neurologic:  Normal speech and language. No gross focal neurologic deficits are appreciated. Speech is normal. No gait instability. Skin:  Skin is warm, dry and intact. No rash noted. Psychiatric: Mood and affect are normal. Speech and behavior are normal.  ____________________________________________   LABS (all labs ordered are listed, but only abnormal results are displayed)  Labs Reviewed  CBC - Abnormal; Notable for the following:    WBC 11.8 (*)    RBC 4.36 (*)    Hemoglobin 11.8 (*)    HCT 37.0 (*)    MCHC 31.9 (*)    All other components within normal limits  BASIC METABOLIC PANEL - Abnormal; Notable for the following:    Chloride 100 (*)    Glucose, Bld 326 (*)    BUN 35 (*)    Creatinine, Ser 2.01 (*)    Calcium 8.7 (*)    GFR calc non Af Amer 31 (*)    GFR calc Af Amer 36 (*)    All other components within normal limits  TROPONIN I  BRAIN NATRIURETIC PEPTIDE   ____________________________________________  EKG  ED ECG REPORT I, Ruby Cola, the attending physician, personally viewed and interpreted this ECG.  Date: 10/26/2014  EKG Time: 2324  Rate: 78  Rhythm: normal EKG, normal sinus rhythm, unchanged from previous tracings, there are no previous tracings available for comparison, normal sinus rhythm, flipped T waves laterally, poor R-wave progression  Axis: Left axis  deviation  Intervals:none possible left atrial enlargement  ST&T Change: Nonspecific ST changes and flipped T waves laterally/LVH with repolarization abnormality ____________________________________________  RADIOLOGY ____________________________________________ Dg Chest 1 View  10/26/2014   CLINICAL DATA:  Acute onset of generalized chest pain, radiating to the upper back. Shortness of breath. Initial encounter.  EXAM: CHEST  1 VIEW  COMPARISON:  Chest radiograph from 01/07/2012  FINDINGS: The lungs are well-aerated. Mild bibasilar atelectasis is noted. There is no evidence of pleural effusion or pneumothorax.  The cardiomediastinal silhouette is enlarged. The patient is status post median sternotomy. No acute osseous abnormalities are seen. Cervical spinal fusion hardware is partially imaged.  IMPRESSION: Mild bibasilar atelectasis noted.  Cardiomegaly seen.   Electronically Signed   By: Garald Balding M.D.   On: 10/26/2014 01:27     PROCEDURES  Procedure(s) performed: None  Critical Care performed: Yes, see critical care note(s)  CRITICAL CARE Performed by: Ruby Cola   Total critical care time: 30-74 minutes  Critical care time was exclusive of separately billable procedures and treating other patients.  Critical care was necessary to treat or prevent imminent or life-threatening deterioration.  Critical care was time spent personally by me on the following activities: development of treatment plan with patient and/or surrogate as well as nursing, discussions with consultants,  evaluation of patient's response to treatment, examination of patient, obtaining history from patient or surrogate, ordering and performing treatments and interventions, ordering and review of laboratory studies, ordering and review of radiographic studies, pulse oximetry and re-evaluation of patient's condition. ____________________________________________   INITIAL IMPRESSION / ASSESSMENT AND PLAN / ED  COURSE  Pertinent labs & imaging results that were available during my care of the patient were reviewed by me and considered in my medical decision making (see chart for details). Patient is going to be given some pain and nausea medicines while awaiting labs and questionable CT.  ----------------------------------------- 2:39 AM on 10/26/2014 -----------------------------------------  Finally have all patient's labs and patient was given one sublingual nitroglycerin to see if it would help his chest pain and in turn it dropped his blood pressure into the 70s. Patient was given a small bolus IV fluids and once his pressure came back up and we placed him on Nitropaste and given some morphine and Zofran for his pain. Dr. Rosilyn Mings hospitalist is going to admit the patient. ____________________________________________   FINAL CLINICAL IMPRESSION(S) / ED DIAGNOSES  Final diagnoses:  Acute chest pain  Unstable angina  Uncontrolled diabetes mellitus      Ruby Cola, MD 10/26/14 0246

## 2014-10-26 NOTE — H&P (Addendum)
[name] is an 74 y.o. male.   Chief Complaint: Chest pain HPI: The patient presents emergency department complaining of chest pain that began while he was walking. The patient was not carrying a load or walking up steps. He began to have substernal pressure that radiated to his left breast and to his back. He states that the pain felt like his previous myocardial infarction. The pain made him feel weak and he sat down but did not have the strength to stand back up. It lasted at least 20 minutes. He admits to diaphoresis but denies nausea and vomiting. He also endorses lightheadedness. Notably his care doctor recently added a second diuretic to his regimen due to lower extremity edema. He is also been on antibiotics recently for a wound on his left lower leg. In the emergency department the patient was given nitroglycerin sublingually which dropped his systolic pressure to 80. He has received a liter of normal saline which has improved his blood pressure to approximately 130. She'll troponin is negative. There are no EKG changes. Due to his extensive cardiac history the emergency department staff called for admission.  Past Medical History  Diagnosis Date  . Diabetes mellitus   . Hyperlipidemia   . H/O cardiac catheterization 06/03/06,12/03/11  . BPH (benign prostatic hypertrophy)   . Anxiety   . Syncope     LAUGHING INDUCED  . Myocardial infarction     x 2  . CHF (congestive heart failure)   . CAD (coronary artery disease) 06/03/06    cypher 2.5 x 56m DES for 80% mid RCA sees Dr. FUbaldo Glassing . Asthma     as a child  . Sleep apnea     hx of  . Kidney stone     hx of  . GERD (gastroesophageal reflux disease)     on medication for  . 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 Arthritis   . Anemia     taking iron  . Cataract, bilateral   . Neuromuscular disorder     diabetic neuropathy    Past Surgical History  Procedure Laterality Date  . Spine surgery  9/05    CERVICAL  LAMINECTOMY  . Cervical herniated    . Eye surgery      as a child  . Coronary artery bypass graft  12/12/2011    Procedure: CORONARY ARTERY BYPASS GRAFTING (CABG);  Surgeon: BGaye Pollack MD;  Location: [facility]  Service: Open Heart Surgery;  Laterality: N/A;  coronary artery bypass graft times four using left internal mammary artery, right leg greater saphenous vein harvested endoscopically    Family History  Problem Relation Age of Onset  . Diabetes Mellitus II Mother     Also Brother and sister  . Leukemia Father    Social History:  reports that he quit smoking about 36 years ago. His smoking use included Cigarettes and Cigars. He has never used smokeless tobacco. He reports that he does not drink alcohol or use illicit drugs.  Allergies:  Allergies  Allergen Reactions  . Zithromax [Azithromycin] Anaphylaxis    Swells, can't breathe  . Lisinopril     Per spouse, pt did not tolerate this in past.     Prior to Admission medications   Medication Sig Start Date End Date Taking? Authorizing Provider  ferrous sulfate 325 (65 FE) MG tablet Take 1 tablet (325 mg total) by mouth daily with breakfast. 12/16/11 12/15/12  Erin R Barrett, PA-C  fish oil-omega-3 fatty acids 1000 MG capsule Take 1 g by mouth 2 (two) times daily.    Historical Provider, MD  furosemide (LASIX) 40 MG tablet Take 40 mg by mouth every morning.     Historical Provider, MD  gabapentin (NEURONTIN) 300 MG capsule Take 300 mg by mouth 2 (two) times daily.    Historical Provider, MD  insulin aspart (NOVOLOG) 100 UNIT/ML injection Inject 4-5 Units into the skin 3 (three) times daily before meals. Per sliding scale    Historical Provider, MD  insulin glargine (LANTUS) 100 UNIT/ML injection Inject 30 Units into the skin 2 (two) times daily. 12/19/11   Gina L Collins, PA-C  Iron-Vitamin C (VITRON-C) 65-125 MG TABS Take 1 tablet by mouth every morning.     Historical Provider, MD  lovastatin (MEVACOR) 20 MG tablet Take 40 mg by  mouth at bedtime.    Historical Provider, MD  meloxicam (MOBIC) 15 MG tablet Take 7.5 mg by mouth every morning.     Historical Provider, MD  metFORMIN (GLUMETZA) 500 MG (MOD) 24 hr tablet Take 1,000 mg by mouth 2 (two) times daily with a meal.    Historical Provider, MD  omeprazole (PRILOSEC) 20 MG capsule Take 20 mg by mouth 2 (two) times daily.    Historical Provider, MD  Potassium Aminobenzoate 500 MG CAPS Take 1 capsule by mouth every morning.     Historical Provider, MD  traMADol (ULTRAM) 50 MG tablet Take 50 mg by mouth every 6 (six) hours as needed.    Historical Provider, MD     Results for orders placed or performed during the hospital encounter of 10/25/14 (from the past 48 hour(s))  CBC     Status: Abnormal   Collection Time: 10/25/14 11:26 PM  Result Value Ref Range   WBC 11.8 (H) 3.8 - 10.6 K/uL   RBC 4.36 (L) 4.40 - 5.90 MIL/uL   Hemoglobin 11.8 (L) 13.0 - 18.0 g/dL   HCT 37.0 (L) 40.0 - 52.0 %   MCV 84.8 80.0 - 100.0 fL   MCH 27.0 26.0 - 34.0 pg   MCHC 31.9 (L) 32.0 - 36.0 g/dL   RDW 14.3 11.5 - 14.5 %   Platelets 179 150 - 440 K/uL  Basic metabolic panel     Status: Abnormal   Collection Time: 10/25/14 11:26 PM  Result Value Ref Range   Sodium 135 135 - 145 mmol/L   Potassium 4.0 3.5 - 5.1 mmol/L   Chloride 100 (L) 101 - 111 mmol/L   CO2 24 22 - 32 mmol/L   Glucose, Bld 326 (H) 65 - 99 mg/dL   BUN 35 (H) 6 - 20 mg/dL   Creatinine, Ser 2.01 (H) 0.61 - 1.24 mg/dL   Calcium 8.7 (L) 8.9 - 10.3 mg/dL   GFR calc non Af Amer 31 (L) >60 mL/min   GFR calc Af Amer 36 (L) >60 mL/min    Comment: (NOTE) The eGFR has been calculated using the CKD EPI equation. This calculation has not been validated in all clinical situations. eGFR's persistently <60 mL/min signify possible Chronic Kidney Disease.    Anion gap 11 5 - 15  Troponin I     Status: None   Collection Time: 10/25/14 11:26 PM  Result Value Ref Range   Troponin I <0.03 <0.031 ng/mL    Comment:        NO  INDICATION OF MYOCARDIAL INJURY.   Brain natriuretic peptide  Status: None   Collection Time: 10/25/14 11:26 PM  Result Value Ref Range   B Natriuretic Peptide 53.0 0.0 - 100.0 pg/mL   Dg Chest 1 View  10/26/2014   CLINICAL DATA:  Acute onset of generalized chest pain, radiating to the upper back. Shortness of breath. Initial encounter.  EXAM: CHEST  1 VIEW  COMPARISON:  Chest radiograph from 01/07/2012  FINDINGS: The lungs are well-aerated. Mild bibasilar atelectasis is noted. There is no evidence of pleural effusion or pneumothorax.  The cardiomediastinal silhouette is enlarged. The patient is status post median sternotomy. No acute osseous abnormalities are seen. Cervical spinal fusion hardware is partially imaged.  IMPRESSION: Mild bibasilar atelectasis noted.  Cardiomegaly seen.   Electronically Signed   By: Garald Balding M.D.   On: 10/26/2014 01:27    Review of Systems  Constitutional: Positive for diaphoresis. Negative for fever and chills.  HENT: Negative for sore throat and tinnitus.   Eyes: Negative for blurred vision and redness.  Respiratory: Negative for cough and shortness of breath.   Cardiovascular: Positive for chest pain. Negative for palpitations, orthopnea and PND.  Gastrointestinal: Positive for nausea. Negative for vomiting, abdominal pain and diarrhea.  Genitourinary: Negative for dysuria, urgency and frequency.  Musculoskeletal: Negative for myalgias and joint pain.  Skin: Negative for rash.       No lesions  Neurological: Negative for speech change, focal weakness and weakness.  Endo/Heme/Allergies: Does not bruise/bleed easily.       No temperature intolerance  Psychiatric/Behavioral: Negative for depression and suicidal ideas.    Blood pressure 155/74, pulse 73, temperature 98.6 F (37 C), temperature source Oral, resp. rate 18, height 5' 8"  (1.727 m), weight 95.255 kg (210 lb), SpO2 96 %. Physical Exam  Nursing note and vitals reviewed. Constitutional:  He is oriented to person, place, and time. He appears well-developed and well-nourished. No distress.  HENT:  Head: Normocephalic and atraumatic.  Mouth/Throat: Oropharynx is clear and moist.  Eyes: Conjunctivae and EOM are normal. Pupils are equal, round, and reactive to light. No scleral icterus.  Neck: Normal range of motion. Neck supple. No JVD present. No tracheal deviation present. No thyromegaly present.  Cardiovascular: Normal rate, regular rhythm, normal heart sounds and intact distal pulses.  Exam reveals no gallop and no friction rub.   No murmur heard. Respiratory: Effort normal and breath sounds normal.  GI: Soft. Bowel sounds are normal. He exhibits distension.  Genitourinary:  Deferred  Musculoskeletal: Normal range of motion. He exhibits no edema.  Lymphadenopathy:    He has no cervical adenopathy.  Neurological: He is alert and oriented to person, place, and time. No cranial nerve deficit.  Skin: Skin is warm and dry. No rash noted. No erythema.     Psychiatric: He has a normal mood and affect. His behavior is normal. Judgment and thought content normal.     Assessment/Plan This is a 74 year old Caucasian male with coronary artery disease admitted for angina. 1. Chest pain: Angina: The patient has recently undergone a nuclear stress test which showed some areas of ischemia consistent with previous anatomy. Echocardiogram (February 2016) reportedly shows anterolateral and inferior hypokinesis with mildly reduced ejection fraction. EKG is negative for ischemia at this time. We will continue to follow cardiac enzymes and I will obtain a cardiology consult in the morning. I have given the patient aspirin 325 mg. He will be nothing by mouth for morning for possible cardiac catheterization. We will try morphine for chest pain to potentially avoid  severe hypotension. 2. Diabetes mellitus type 2: I have reduced the patient's Lantus dose and added sliding cell insulin while  hospitalized. Hold oral hypoglycemics and metformin. 3. CHF: Systolic; stable. The patient recently had an extra diuretic added to his regimen. 4. 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 I will gently hydrate him at this time 5. Osteoarthritis: Discontinue ibuprofen derivatives due to cardiovascular risk 6. Venous stasis ulcer: Medial left leg; healing well. Patient denies drainage of pus. The patient reports that his left leg frequently swells significantly more than his right (notably, vein harvesting for CABG was from the right leg). He denies pain in the calf which is reassuring. I will not order Doppler ultrasounds of his legs at this time and his symptoms did not specifically correlate with pulmonary embolism. 7. Constipation: Likely secondary to increased diuretics. We will try multiple laxatives and fiber supplementation to improve bowel movements 8. DVT prophylaxis: Heparin 9. GI prophylaxis: None The patient is a full code. Time spent on admission orders and patient care approximately 35 minutes   Harrie Foreman 10/26/2014, 4:33 AM

## 2014-10-26 NOTE — ED Notes (Signed)
Patient reports feeling weak, states symptoms began about 7pm last night.

## 2014-10-26 NOTE — Progress Notes (Signed)
Dr. Saralyn Pilar notified of BUN CREAT from 7/5---instructions received to hydrate with 250 ml bolus--infusing now

## 2014-10-27 LAB — CBC WITH DIFFERENTIAL/PLATELET
Basophils Absolute: 0 10*3/uL (ref 0–0.1)
Basophils Relative: 1 %
Eosinophils Absolute: 0.2 10*3/uL (ref 0–0.7)
Eosinophils Relative: 2 %
HEMATOCRIT: 34.1 % — AB (ref 40.0–52.0)
HEMOGLOBIN: 11 g/dL — AB (ref 13.0–18.0)
Lymphocytes Relative: 20 %
Lymphs Abs: 1.5 10*3/uL (ref 1.0–3.6)
MCH: 27.5 pg (ref 26.0–34.0)
MCHC: 32.3 g/dL (ref 32.0–36.0)
MCV: 85.1 fL (ref 80.0–100.0)
MONO ABS: 0.6 10*3/uL (ref 0.2–1.0)
MONOS PCT: 8 %
Neutro Abs: 5.4 10*3/uL (ref 1.4–6.5)
Neutrophils Relative %: 69 %
Platelets: 156 10*3/uL (ref 150–440)
RBC: 4 MIL/uL — AB (ref 4.40–5.90)
RDW: 14.2 % (ref 11.5–14.5)
WBC: 7.7 10*3/uL (ref 3.8–10.6)

## 2014-10-27 LAB — TROPONIN I: Troponin I: <0.03 ng/mL (ref <0.031)

## 2014-10-27 LAB — BASIC METABOLIC PANEL
ANION GAP: 6 (ref 5–15)
BUN: 29 mg/dL — ABNORMAL HIGH (ref 6–20)
CHLORIDE: 105 mmol/L (ref 101–111)
CO2: 27 mmol/L (ref 22–32)
Calcium: 8.6 mg/dL — ABNORMAL LOW (ref 8.9–10.3)
Creatinine, Ser: 1.65 mg/dL — ABNORMAL HIGH (ref 0.61–1.24)
GFR calc non Af Amer: 40 mL/min — ABNORMAL LOW (ref >60)
GFR, EST AFRICAN AMERICAN: 46 mL/min — AB (ref >60)
Glucose, Bld: 355 mg/dL — ABNORMAL HIGH (ref 65–99)
Potassium: 4.3 mmol/L (ref 3.5–5.1)
SODIUM: 138 mmol/L (ref 135–145)

## 2014-10-27 LAB — GLUCOSE, CAPILLARY
Glucose-Capillary: 351 mg/dL — ABNORMAL HIGH (ref 65–99)
Glucose-Capillary: 491 mg/dL — ABNORMAL HIGH (ref 65–99)

## 2014-10-27 MED ORDER — METHYLPREDNISOLONE SODIUM SUCC 125 MG IJ SOLR
60.0000 mg | Freq: Once | INTRAMUSCULAR | Status: AC
  Administered 2014-10-27: 60 mg via INTRAVENOUS
  Filled 2014-10-27: qty 2

## 2014-10-27 NOTE — Progress Notes (Signed)
Paged Dr. Sabra Heck regarding noon CBG of 491. Verbal order to give highest dose of insulin that the sliding scale indicates. Confirmed verbally that this will be 15u of insulin.

## 2014-10-27 NOTE — Discharge Instructions (Signed)
Angiogram °An angiogram, also called angiography, is a procedure used to look at the blood vessels that carry blood to different parts of your body (arteries). In this procedure, dye is injected through a long, thin tube (catheter) into an artery. X-rays are then taken. The X-rays will show if there is a blockage or problem in a blood vessel.  °LET YOUR HEALTH CARE PROVIDER KNOW ABOUT: °· Any allergies you have, including allergies to shellfish or contrast dye.   °· All medicines you are taking, including vitamins, herbs, eye drops, creams, and over-the-counter medicines.   °· Previous problems you or members of your family have had with the use of anesthetics.   °· Any blood disorders you have.   °· Previous surgeries you have had. °· Any previous kidney problems or failure you have had.  °· Medical conditions you have.   °· Possibility of pregnancy, if this applies. °RISKS AND COMPLICATIONS °Generally, an angiogram is a safe procedure. However, as with any procedure, problems can occur. Possible problems include: °· Injury to the blood vessels, including rupture or bleeding. °· Infection or bruising at the catheter site. °· Allergic reaction to the dye or contrast used. °· Kidney damage from the dye or contrast used. °· Blood clots that can lead to a stroke or heart attack. °BEFORE THE PROCEDURE °· Do not eat or drink after midnight on the night before the procedure, or as directed by your health care provider.   °· Ask your health care provider if you may drink enough water to take any needed medicines the morning of the procedure.   °PROCEDURE °· You may be given a medicine to help you relax (sedative) before and during the procedure. This medicine is given through an IV access tube that is inserted into one of your veins.   °· The area where the catheter will be inserted will be washed and shaved. This is usually done in the groin but may be done in the fold of your arm (near your elbow) or in the wrist. °· A  medicine will be given to numb the area where the catheter will be inserted (local anesthetic). °· The catheter will be inserted with a guide wire into an artery. The catheter is guided by using a type of X-ray (fluoroscopy) to the blood vessel being examined.   °· Dye is then injected into the catheter, and X-rays are taken. The dye helps to show where any narrowing or blockages are located.   °AFTER THE PROCEDURE  °· If the procedure is done through the leg, you will be kept in bed lying flat for several hours. You will be instructed to not bend or cross your legs. °· The insertion site will be checked frequently. °· The pulse in your feet or wrist will be checked frequently. °· Additional blood tests, X-rays, and electrocardiography may be done.   °· You may need to stay in the hospital overnight for observation.   °Document Released: 01/16/2005 Document Revised: 04/13/2013 Document Reviewed: 09/09/2012 °[employer]® Patient Information ©2015 [employer]. This information is not intended to replace advice given to you by your health care provider. Make sure you discuss any questions you have with your health care provider. ° °

## 2014-10-27 NOTE — Progress Notes (Signed)
Inpatient Diabetes Program Recommendations  AACE/ADA: New Consensus Statement on Inpatient Glycemic Control (2013)  Target Ranges:  Prepandial:   less than 140 mg/dL      Peak postprandial:   less than 180 mg/dL (1-2 hours)      Critically ill patients:  140 - 180 mg/dL   Reason for assessment: elevated CBG and A1C  Diabetes history: Type 2 diabetes Outpatient Diabetes medications: Lantus 30 units bid, Novolog correction tid "sliding scale"   Current orders for Inpatient glycemic control: Lantus 20 units qhs, Novolog correction 0-15 units tid, Novolog 0-5 units qhs  Based on current inpatient blood sugars and current A1C, patient would benefit from outpatient diabetes education. Spoke with patient by phone, he is not interested in outpatient diabetes education stating " I can't afford diabetes education".  When I told him his A1C was 11.5%, he stated,  "I've had diabetes all my life and I've been educating myself every day".  He also stated his A1C was elevated because of "recent antibiotics and steroids". Patient not interested.    Gentry Fitz, RN, BA, MHA, CDE Diabetes Coordinator Inpatient Diabetes Program  [phone] (Team Pager) [phone] ([place]) 10/27/2014 8:23 AM

## 2014-10-27 NOTE — Discharge Summary (Signed)
[name], is a 74 y.o. male  DOB 04-Aug-1940  MRN [id number].  Admission date:  10/25/2014  Admitting Physician  Harrie Foreman, MD  Discharge Date:  10/27/2014    Admission Diagnosis  Unstable angina [I20.0] Uncontrolled diabetes mellitus [E11.65] Acute chest pain [R07.9]  Discharge Diagnoses    Unstable angina due to coronary artery disease Chest wall pain Diabetes mellitus, insulin-requiring Chronic systolic congestive heart failure CKD3 Chronic anemia GERD Asthma   Past Medical History  Diagnosis Date  . Diabetes mellitus   . Hyperlipidemia   . H/O cardiac catheterization 06/03/06,12/03/11  . BPH (benign prostatic hypertrophy)   . Anxiety   . Syncope     LAUGHING INDUCED  . Myocardial infarction     x 2  . CHF (congestive heart failure)   . CAD (coronary artery disease) 06/03/06    cypher 2.5 x 20mm DES for 80% mid RCA sees Dr. Ubaldo Glassing  . Asthma     as a child  . Sleep apnea     hx of  . Kidney stone     hx of  . GERD (gastroesophageal reflux disease)     on medication for  . 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 Arthritis   . Anemia     taking iron  . Cataract, bilateral   . Neuromuscular disorder     diabetic neuropathy    Past Surgical History  Procedure Laterality Date  . Spine surgery  9/05    CERVICAL LAMINECTOMY  . Cervical herniated    . Eye surgery      as a child  . Coronary artery bypass graft  12/12/2011    Procedure: CORONARY ARTERY BYPASS GRAFTING (CABG);  Surgeon: Gaye Pollack, MD;  Location: [facility];  Service: Open Heart Surgery;  Laterality: N/A;  coronary artery bypass graft times four using left internal mammary artery, right leg greater saphenous vein harvested endoscopically  . Cardiac catheterization N/A 10/26/2014    Procedure: Left Heart Cath;  Surgeon: Isaias Cowman, MD;  Location: [facility];  Service:  Cardiovascular;  Laterality: N/A;       History of present illness and  Hospital Course:     Kindly see H&P for history of present illness and admission details, please review complete Labs, Consult reports and Test reports for all details in brief  HPI  from the history and physical done on the day of admission    Hospital Course    Patient was admitted with chest pain, troponins negative. With chest pain history underwent heart catheterization showing diffuse disease but nothing amenable to stenting. His chest pain continued and was thought to be chest wall pain was given Solu-Medrol IV 1. His initial creatinine was 2.0 with hydration went down to 1.7 on discharge. He is for medical management of his coronary artery disease.   Discharge Condition: Stable   Follow UP  Dr. Sabra Heck 2 weeks    Discharge Instructions  and  Discharge Medications  Carvedilol 12.5 g twice a day Imdur 30 mg daily Aspirin 81 mg daily Fish oil 1000 g daily Gabapentin 600 mg at bedtime NovoLog insulin before every meal C Lantus insulin 30 units twice a day Lovastatin 40 mg at bedtime Meloxicam 7.5 mg every morning Omeprazole 20 g twice a day Torsemide 40 mg daily Tramadol 50 mg every 6 when necessary pain Vitron C iron pill daily   Today   Subjective:   [name] today continues to have chest wall discomfort, nonexertional.   Objective:   Blood pressure 164/72, pulse 73, temperature 98.2 F (36.8 C), temperature source Oral, resp. rate 18, height 5\' 8"  (1.727 m), weight 94.031 kg (207 lb 4.8 oz), SpO2 96 %.   Exam  stable, mild chest wall tenderness  Total Time in preparing paper work, data evaluation and todays exam - 35 minutes  MILLER,MARK F. M.D on 10/27/2014 at 7:16 AM

## 2014-10-28 LAB — GLUCOSE, CAPILLARY: Glucose-Capillary: 184 mg/dL — ABNORMAL HIGH (ref 65–99)

## 2014-10-28 NOTE — Progress Notes (Signed)
Pt. Discharged to home. Discharge instructions and medication regimen reviewed at bedside with patient and wife. Both able to verbalize understanding of instructions and medication regimen. Using teach back method, pt was able to verbalize at home care of cath site. Pt was ambulated around the nurses station this AM per MD request and tolerated well, see flowsheets. Patient assessment unchanged from this morning. TELE and IV discontinued per policy.

## 2015-01-22 ENCOUNTER — Emergency Department
Admission: EM | Admit: 2015-01-22 | Discharge: 2015-01-22 | Disposition: A | Discharge status: Home / Self Care | Payer: Commercial Managed Care - HMO | Attending: Emergency Medicine | Admitting: Emergency Medicine

## 2015-01-22 ENCOUNTER — Encounter: Payer: Self-pay | Admitting: Emergency Medicine

## 2015-01-22 ENCOUNTER — Emergency Department: Payer: Commercial Managed Care - HMO

## 2015-01-22 DIAGNOSIS — R531 Weakness: Secondary | ICD-10-CM | POA: Diagnosis present

## 2015-01-22 DIAGNOSIS — Z7982 Long term (current) use of aspirin: Secondary | ICD-10-CM | POA: Diagnosis not present

## 2015-01-22 DIAGNOSIS — N179 Acute kidney failure, unspecified: Secondary | ICD-10-CM

## 2015-01-22 DIAGNOSIS — Z794 Long term (current) use of insulin: Secondary | ICD-10-CM | POA: Insufficient documentation

## 2015-01-22 DIAGNOSIS — E119 Type 2 diabetes mellitus without complications: Secondary | ICD-10-CM | POA: Insufficient documentation

## 2015-01-22 DIAGNOSIS — Z79899 Other long term (current) drug therapy: Secondary | ICD-10-CM | POA: Insufficient documentation

## 2015-01-22 DIAGNOSIS — Z87891 Personal history of nicotine dependence: Secondary | ICD-10-CM | POA: Insufficient documentation

## 2015-01-22 DIAGNOSIS — I10 Essential (primary) hypertension: Secondary | ICD-10-CM | POA: Insufficient documentation

## 2015-01-22 DIAGNOSIS — Z791 Long term (current) use of non-steroidal anti-inflammatories (NSAID): Secondary | ICD-10-CM | POA: Diagnosis not present

## 2015-01-22 LAB — BASIC METABOLIC PANEL
ANION GAP: 5 (ref 5–15)
BUN: 37 mg/dL — ABNORMAL HIGH (ref 6–20)
CHLORIDE: 104 mmol/L (ref 101–111)
CO2: 27 mmol/L (ref 22–32)
Calcium: 8.7 mg/dL — ABNORMAL LOW (ref 8.9–10.3)
Creatinine, Ser: 2.19 mg/dL — ABNORMAL HIGH (ref 0.61–1.24)
GFR calc Af Amer: 32 mL/min — ABNORMAL LOW (ref >60)
GFR, EST NON AFRICAN AMERICAN: 28 mL/min — AB (ref >60)
Glucose, Bld: 274 mg/dL — ABNORMAL HIGH (ref 65–99)
POTASSIUM: 3.9 mmol/L (ref 3.5–5.1)
SODIUM: 136 mmol/L (ref 135–145)

## 2015-01-22 LAB — CBC
HEMATOCRIT: 36.6 % — AB (ref 40.0–52.0)
HEMOGLOBIN: 11.7 g/dL — AB (ref 13.0–18.0)
MCH: 26.7 pg (ref 26.0–34.0)
MCHC: 32.1 g/dL (ref 32.0–36.0)
MCV: 83.3 fL (ref 80.0–100.0)
Platelets: 167 10*3/uL (ref 150–440)
RBC: 4.39 MIL/uL — AB (ref 4.40–5.90)
RDW: 14.8 % — ABNORMAL HIGH (ref 11.5–14.5)
WBC: 9.2 10*3/uL (ref 3.8–10.6)

## 2015-01-22 LAB — URINALYSIS COMPLETE WITH MICROSCOPIC (ARMC ONLY)
BACTERIA UA: NONE SEEN
BILIRUBIN URINE: NEGATIVE
Glucose, UA: >500 mg/dL — AB
HGB URINE DIPSTICK: NEGATIVE
KETONES UR: NEGATIVE mg/dL
LEUKOCYTES UA: NEGATIVE
NITRITE: NEGATIVE
PH: 5 (ref 5.0–8.0)
Protein, ur: NEGATIVE mg/dL
SPECIFIC GRAVITY, URINE: 1.014 (ref 1.005–1.030)

## 2015-01-22 LAB — TROPONIN I: TROPONIN I: 0.03 ng/mL (ref <0.031)

## 2015-01-22 MED ORDER — SODIUM CHLORIDE 0.9 % IV BOLUS (SEPSIS)
1000.0000 mL | Freq: Once | INTRAVENOUS | Status: AC
  Administered 2015-01-22: 1000 mL via INTRAVENOUS

## 2015-01-22 NOTE — ED Provider Notes (Signed)
[facility] Emergency Department Provider Note   ____________________________________________  Time seen: 12:25 PM I have reviewed the triage vital signs and the triage nursing note.  HISTORY  Chief Complaint Other   [place] Patient, wife  HPI [name] is a 74 y.o. male who is presenting for complaint of generalized weakness which seemed to start yesterday afternoon. Patient states that when he sits down he has trouble standing back up because his legs are giving out. This was not one-sided, it's both legs and he is also felt just generalized weak all over. Denies fever. Denies cough, congestion, vomiting, or diarrhea. This morning when the patient woke up he also had some central chest sharp pain that lasted a few minutes and went through to his back. He's had this type of pains before. Because of similar pains he had a catheterization 3 months ago and was told that he had a small blockage which was not amenable to stenting. At that time he was tried on Imdur, however he did not tolerate due to low blood pressure, weakness, and so he has been off of nitrates. He follows with Dr. Ubaldo Glassing for cardiology. Patient denies dysuria, or hematuria. He does have a history of constipation which is chronic. No headache, slurred speech, altered mental status, or focal weakness.    Past Medical History  Diagnosis Date  . Diabetes mellitus   . Hyperlipidemia   . H/O cardiac catheterization 06/03/06,12/03/11  . BPH (benign prostatic hypertrophy)   . Anxiety   . Syncope     LAUGHING INDUCED  . Myocardial infarction ([place])     x 2  . CHF (congestive heart failure) ([place])   . CAD (coronary artery disease) 06/03/06    cypher 2.5 x 41mm DES for 80% mid RCA sees Dr. Ubaldo Glassing  . Asthma     as a child  . Sleep apnea     hx of  . Kidney stone     hx of  . GERD (gastroesophageal reflux disease)     on medication for  . 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  Arthritis   . Anemia     taking iron  . Cataract, bilateral   . Neuromuscular disorder [facility])     diabetic neuropathy    Patient Active Problem List   Diagnosis Date Noted  . Angina at rest [facility]) 10/26/2014  . Unstable angina ([place]) 10/26/2014  . 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 Diabetes mellitus   . Hyperlipidemia   . CAD (coronary artery disease)   . Sleep apnea   . Asthma   . BPH (benign prostatic hypertrophy)   . Anxiety   . Syncope     Past Surgical History  Procedure Laterality Date  . Spine surgery  9/05    CERVICAL LAMINECTOMY  . Cervical herniated    . Eye surgery      as a child  . Coronary artery bypass graft  12/12/2011    Procedure: CORONARY ARTERY BYPASS GRAFTING (CABG);  Surgeon: Gaye Pollack, MD;  Location: [place];  Service: Open Heart Surgery;  Laterality: N/A;  coronary artery bypass graft times four using left internal mammary artery, right leg greater saphenous vein harvested endoscopically  . Cardiac catheterization N/A 10/26/2014    Procedure: Left Heart Cath;  Surgeon: Isaias Cowman, MD;  Location: [place] [facility];  Service: Cardiovascular;  Laterality: N/A;    Current Outpatient  Rx  Name  Route  Sig  Dispense  Refill  . aspirin EC 81 MG tablet   Oral   Take 81 mg by mouth daily.         . carvedilol (COREG) 12.5 MG tablet   Oral   Take 1 tablet (12.5 mg total) by mouth 2 (two) times daily with a meal.   60 tablet   11   . fish oil-omega-3 fatty acids 1000 MG capsule   Oral   Take 1 g by mouth 2 (two) times daily.         Marland Kitchen gabapentin (NEURONTIN) 300 MG capsule   Oral   Take 300 mg by mouth 2 (two) times daily.         . insulin aspart (NOVOLOG) 100 UNIT/ML injection   Subcutaneous   Inject 4-5 Units into the skin 3 (three) times daily before meals. Per sliding scale         . insulin glargine (LANTUS) 100 UNIT/ML injection   Subcutaneous   Inject 30 Units into the skin 2 (two) times daily.   10 mL      . Iron-Vitamin C  (VITRON-C) 65-125 MG TABS   Oral   Take 1 tablet by mouth every morning.          . isosorbide mononitrate (IMDUR) 30 MG 24 hr tablet   Oral   Take 1 tablet (30 mg total) by mouth daily.   30 tablet   11   . lovastatin (MEVACOR) 20 MG tablet   Oral   Take 40 mg by mouth at bedtime.         . meloxicam (MOBIC) 15 MG tablet   Oral   Take 7.5 mg by mouth every morning.          Marland Kitchen omeprazole (PRILOSEC) 20 MG capsule   Oral   Take 20 mg by mouth 2 (two) times daily.         Marland Kitchen torsemide (DEMADEX) 20 MG tablet   Oral   Take 40 mg by mouth daily.         . traMADol (ULTRAM) 50 MG tablet   Oral   Take 50 mg by mouth every 6 (six) hours as needed.           Allergies Zithromax and Lisinopril  Family History  Problem Relation Age of Onset  . Diabetes Mellitus II Mother     Also Brother and sister  . Leukemia Father     Social History Social History  Substance Use Topics  . Smoking status: Former Smoker    Types: Cigarettes, Cigars    Quit date: 12/03/1977  . Smokeless tobacco: Never Used  . Alcohol Use: No    Review of Systems  Constitutional: Negative for fever. Eyes: Negative for visual changes. ENT: Negative for sore throat. Cardiovascular: Negative for palpitations.Marland Kitchen Respiratory: Negative for shortness of breath. Gastrointestinal: Negative for abdominal pain, vomiting and diarrhea. Genitourinary: Negative for dysuria. Musculoskeletal: Negative for back pain. Skin: Negative for rash. Neurological: Negative for headache. 10 point Review of Systems otherwise negative ____________________________________________   PHYSICAL EXAM:  VITAL SIGNS: ED Triage Vitals  Enc Vitals Group     BP 01/22/15 0935 103/54 mmHg     Pulse Rate 01/22/15 0935 77     Resp 01/22/15 0935 18     Temp 01/22/15 0935 98.2 F (36.8 C)     Temp Source 01/22/15 0935 Oral     SpO2 01/22/15 0935 99 %  Weight 01/22/15 0935 203 lb (92.08 kg)     Height 01/22/15 0935  5\' 7"  (1.702 m)     Head Cir --      Peak Flow --      Pain Score 01/22/15 0939 7     Pain Loc --      Pain Edu? --      Excl. in [place]? --      Constitutional: Alert and oriented. Well appearing and in no distress. Eyes: Conjunctivae are normal. PERRL. Normal extraocular movements. ENT   Head: Normocephalic and atraumatic.   Nose: No congestion/rhinnorhea.   Mouth/Throat: Mildly dry mucous membranes of the mouth.   Neck: No stridor. Cardiovascular/Chest: Normal rate, regular rhythm.  No murmurs, rubs, or gallops. Respiratory: Normal respiratory effort without tachypnea nor retractions. Breath sounds are clear and equal bilaterally. No wheezes/rales/rhonchi. Gastrointestinal: Soft. No distention, no guarding, no rebound. Nontender   Genitourinary/rectal:Deferred Musculoskeletal: Nontender with normal range of motion in all extremities. No joint effusions.  No lower extremity tenderness.  No edema. Neurologic:  Normal speech and language. No gross or focal neurologic deficits are appreciated. Cranial nerves 2 through 10 are intact. 5 out of 5 strength in bilateral upper and lower extremities. Skin:  Skin is warm, dry and intact. No rash noted. Psychiatric: Mood and affect are normal. Speech and behavior are normal. Patient exhibits appropriate insight and judgment.  ____________________________________________   EKG I, Lisa Roca, MD, the attending physician have personally viewed and interpreted all ECGs.  77 bpm, sinus rhythm with occasional PVC. Narrow QRS. Left axis deviation. LVH. ST depression in 1 and aVL with T-wave inversion. Unchanged from prior. ____________________________________________  LABS (pertinent positives/negatives)  Basic metabolic panel significant for glucose 274, BUN 37, creatinine 2.19 Troponin 0.03 White blood count 9.2, hemoglobin 11.7, platelet count 167  Urinalysis  negative  ____________________________________________  RADIOLOGY All Xrays were viewed by me. Imaging interpreted by Radiologist.  Chest x-ray two-view:  Negative __________________________________________  PROCEDURES  Procedure(s) performed: None  Critical Care performed: None  ____________________________________________   ED COURSE / ASSESSMENT AND PLAN  CONSULTATIONS: None  Pertinent labs & imaging results that were available during my care of the patient were reviewed by me and considered in my medical decision making (see chart for details).   The patient's here for evaluation of generalized weakness which sounds like is most obvious to him when he is trying take get up from a seated position. On physical examination, he has no focal neurologic deficit or appreciable weakness on exam. His mouth is dry, and his BUN and creatinine are minimally elevated, and I suspect possible dehydration as a source. He has positive for orthostatic hypotension.  I am going to treat with a liter of normal saline. I will also add on a urinalysis. I am not suspicious of stroke or TIA, as are no focal deficits by history or exam.  In terms of patient's complaint of central chest discomfort and back discomfort, it sounds like this is a relatively common occurrence for him which has been evaluated by his cardiologist. He has an appointment with Dr. Ubaldo Glassing in 2 weeks, and asked him to all this week and discuss whether or not they want to move this point. He shows follow-up with his primary care physician.  Patient presents one better after IV fluid bolus. Urinalysis is negative. Patient was discharged home with return precautions.  Patient / Family / Caregiver informed of clinical course, medical decision-making process, and agree with plan.  I discussed return precautions, follow-up instructions, and discharged instructions with patient and/or  family.  ___________________________________________   FINAL CLINICAL IMPRESSION(S) / ED DIAGNOSES   Final diagnoses:  Acute renal failure, unspecified acute renal failure type ([place])  Generalized weakness       Lisa Roca, MD 01/22/15 1702

## 2015-01-22 NOTE — Discharge Instructions (Signed)
You were evaluated for generalized weakness and was found to have signs of mild dehydration and low blood pressure upon standing called orthostatic hypotension. Urine given IV fluids here in the emergency department. Return to the emergency department for any new or worsening condition including one-sided weakness or numbness, confusion or altered mental status, pain or any other symptoms concerning to you.  Dehydration, Adult Dehydration is when you lose more fluids from the body than you take in. Vital organs like the kidneys, brain, and heart cannot function without a proper amount of fluids and salt. Any loss of fluids from the body can cause dehydration.  CAUSES   Vomiting.  Diarrhea.  Excessive sweating.  Excessive urine output.  Fever. SYMPTOMS  Mild dehydration  Thirst.  Dry lips.  Slightly dry mouth. Moderate dehydration  Very dry mouth.  Sunken eyes.  Skin does not bounce back quickly when lightly pinched and released.  Dark urine and decreased urine production.  Decreased tear production.  Headache. Severe dehydration  Very dry mouth.  Extreme thirst.  Rapid, weak pulse (more than 100 beats per minute at rest).  Cold hands and feet.  Not able to sweat in spite of heat and temperature.  Rapid breathing.  Blue lips.  Confusion and lethargy.  Difficulty being awakened.  Minimal urine production.  No tears. DIAGNOSIS  Your caregiver will diagnose dehydration based on your symptoms and your exam. Blood and urine tests will help confirm the diagnosis. The diagnostic evaluation should also identify the cause of dehydration. TREATMENT  Treatment of mild or moderate dehydration can often be done at home by increasing the amount of fluids that you drink. It is best to drink small amounts of fluid more often. Drinking too much at one time can make vomiting worse. Refer to the home care instructions below. Severe dehydration needs to be treated at the  hospital where you will probably be given intravenous (IV) fluids that contain water and electrolytes. HOME CARE INSTRUCTIONS   Ask your caregiver about specific rehydration instructions.  Drink enough fluids to keep your urine clear or pale yellow.  Drink small amounts frequently if you have nausea and vomiting.  Eat as you normally do.  Avoid:  Foods or drinks high in sugar.  Carbonated drinks.  Juice.  Extremely hot or cold fluids.  Drinks with caffeine.  Fatty, greasy foods.  Alcohol.  Tobacco.  Overeating.  Gelatin desserts.  Wash your hands well to avoid spreading bacteria and viruses.  Only take over-the-counter or prescription medicines for pain, discomfort, or fever as directed by your caregiver.  Ask your caregiver if you should continue all prescribed and over-the-counter medicines.  Keep all follow-up appointments with your caregiver. SEEK MEDICAL CARE IF:  You have abdominal pain and it increases or stays in one area (localizes).  You have a rash, stiff neck, or severe headache.  You are irritable, sleepy, or difficult to awaken.  You are weak, dizzy, or extremely thirsty. SEEK IMMEDIATE MEDICAL CARE IF:   You are unable to keep fluids down or you get worse despite treatment.  You have frequent episodes of vomiting or diarrhea.  You have blood or green matter (bile) in your vomit.  You have blood in your stool or your stool looks black and tarry.  You have not urinated in 6 to 8 hours, or you have only urinated a small amount of very dark urine.  You have a fever.  You faint. MAKE SURE YOU:   Understand these instructions.  Will watch your condition.  Will get help right away if you are not doing well or get worse. Document Released: 04/08/2005 Document Revised: 07/01/2011 Document Reviewed: 11/26/2010 [facility] Patient Information 2015 [place], [place]. This information is not intended to replace advice given to you by your health  care provider. Make sure you discuss any questions you have with your health care provider.

## 2015-01-22 NOTE — ED Notes (Signed)
Presents with complaints of not being able to walk this am  Having pain to back and chest . Sx's started yesterday . Positive SOB and nausea

## 2015-01-22 NOTE — ED Notes (Signed)
Pt called from lobby, other patients state that pt in bathroom

## 2015-02-10 ENCOUNTER — Ambulatory Visit
Admission: RE | Admit: 2015-02-10 | Discharge: 2015-02-10 | Disposition: A | Discharge status: Home / Self Care | Payer: Commercial Managed Care - HMO | Source: Ambulatory Visit | Attending: Internal Medicine | Admitting: Internal Medicine

## 2015-02-10 ENCOUNTER — Encounter: Payer: Self-pay | Admitting: *Deleted

## 2015-02-10 ENCOUNTER — Other Ambulatory Visit: Payer: Self-pay | Admitting: Internal Medicine

## 2015-02-10 ENCOUNTER — Other Ambulatory Visit: Payer: Self-pay

## 2015-02-10 ENCOUNTER — Emergency Department: Payer: Commercial Managed Care - HMO

## 2015-02-10 ENCOUNTER — Inpatient Hospital Stay
Admission: EM | Admit: 2015-02-10 | Discharge: 2015-02-15 | DRG: 438 | Disposition: A | Discharge status: Home / Self Care | Payer: Commercial Managed Care - HMO | Attending: Internal Medicine | Admitting: Internal Medicine

## 2015-02-10 DIAGNOSIS — E1140 Type 2 diabetes mellitus with diabetic neuropathy, unspecified: Secondary | ICD-10-CM | POA: Diagnosis present

## 2015-02-10 DIAGNOSIS — Z85828 Personal history of other malignant neoplasm of skin: Secondary | ICD-10-CM

## 2015-02-10 DIAGNOSIS — G4730 Sleep apnea, unspecified: Secondary | ICD-10-CM | POA: Diagnosis present

## 2015-02-10 DIAGNOSIS — D490 Neoplasm of unspecified behavior of digestive system: Secondary | ICD-10-CM | POA: Insufficient documentation

## 2015-02-10 DIAGNOSIS — D649 Anemia, unspecified: Secondary | ICD-10-CM | POA: Diagnosis present

## 2015-02-10 DIAGNOSIS — K831 Obstruction of bile duct: Secondary | ICD-10-CM | POA: Diagnosis present

## 2015-02-10 DIAGNOSIS — K838 Other specified diseases of biliary tract: Secondary | ICD-10-CM | POA: Diagnosis not present

## 2015-02-10 DIAGNOSIS — E785 Hyperlipidemia, unspecified: Secondary | ICD-10-CM | POA: Diagnosis present

## 2015-02-10 DIAGNOSIS — R55 Syncope and collapse: Secondary | ICD-10-CM

## 2015-02-10 DIAGNOSIS — F419 Anxiety disorder, unspecified: Secondary | ICD-10-CM | POA: Diagnosis present

## 2015-02-10 DIAGNOSIS — R17 Unspecified jaundice: Secondary | ICD-10-CM | POA: Insufficient documentation

## 2015-02-10 DIAGNOSIS — I5032 Chronic diastolic (congestive) heart failure: Secondary | ICD-10-CM | POA: Diagnosis present

## 2015-02-10 DIAGNOSIS — Z951 Presence of aortocoronary bypass graft: Secondary | ICD-10-CM

## 2015-02-10 DIAGNOSIS — R4182 Altered mental status, unspecified: Secondary | ICD-10-CM

## 2015-02-10 DIAGNOSIS — R7401 Elevation of levels of liver transaminase levels: Secondary | ICD-10-CM

## 2015-02-10 DIAGNOSIS — Z955 Presence of coronary angioplasty implant and graft: Secondary | ICD-10-CM

## 2015-02-10 DIAGNOSIS — Z7982 Long term (current) use of aspirin: Secondary | ICD-10-CM | POA: Diagnosis not present

## 2015-02-10 DIAGNOSIS — K219 Gastro-esophageal reflux disease without esophagitis: Secondary | ICD-10-CM | POA: Diagnosis present

## 2015-02-10 DIAGNOSIS — K859 Acute pancreatitis without necrosis or infection, unspecified: Principal | ICD-10-CM | POA: Diagnosis present

## 2015-02-10 DIAGNOSIS — I252 Old myocardial infarction: Secondary | ICD-10-CM

## 2015-02-10 DIAGNOSIS — R740 Nonspecific elevation of levels of transaminase and lactic acid dehydrogenase [LDH]: Secondary | ICD-10-CM

## 2015-02-10 DIAGNOSIS — N400 Enlarged prostate without lower urinary tract symptoms: Secondary | ICD-10-CM | POA: Diagnosis present

## 2015-02-10 DIAGNOSIS — Z881 Allergy status to other antibiotic agents status: Secondary | ICD-10-CM | POA: Diagnosis not present

## 2015-02-10 DIAGNOSIS — E860 Dehydration: Secondary | ICD-10-CM | POA: Diagnosis present

## 2015-02-10 DIAGNOSIS — Z800 Family history of malignant neoplasm of digestive organs: Secondary | ICD-10-CM

## 2015-02-10 DIAGNOSIS — K8689 Other specified diseases of pancreas: Secondary | ICD-10-CM

## 2015-02-10 DIAGNOSIS — K839 Disease of biliary tract, unspecified: Secondary | ICD-10-CM | POA: Insufficient documentation

## 2015-02-10 DIAGNOSIS — H269 Unspecified cataract: Secondary | ICD-10-CM | POA: Diagnosis present

## 2015-02-10 DIAGNOSIS — R630 Anorexia: Secondary | ICD-10-CM | POA: Diagnosis not present

## 2015-02-10 DIAGNOSIS — I2510 Atherosclerotic heart disease of native coronary artery without angina pectoris: Secondary | ICD-10-CM | POA: Diagnosis present

## 2015-02-10 DIAGNOSIS — R978 Other abnormal tumor markers: Secondary | ICD-10-CM | POA: Diagnosis not present

## 2015-02-10 DIAGNOSIS — R7989 Other specified abnormal findings of blood chemistry: Secondary | ICD-10-CM

## 2015-02-10 DIAGNOSIS — R945 Abnormal results of liver function studies: Principal | ICD-10-CM

## 2015-02-10 DIAGNOSIS — I110 Hypertensive heart disease with heart failure: Secondary | ICD-10-CM | POA: Diagnosis present

## 2015-02-10 DIAGNOSIS — G9340 Encephalopathy, unspecified: Secondary | ICD-10-CM | POA: Diagnosis present

## 2015-02-10 DIAGNOSIS — K869 Disease of pancreas, unspecified: Secondary | ICD-10-CM | POA: Diagnosis not present

## 2015-02-10 DIAGNOSIS — Z87891 Personal history of nicotine dependence: Secondary | ICD-10-CM

## 2015-02-10 DIAGNOSIS — R634 Abnormal weight loss: Secondary | ICD-10-CM | POA: Diagnosis not present

## 2015-02-10 DIAGNOSIS — Z888 Allergy status to other drugs, medicaments and biological substances status: Secondary | ICD-10-CM

## 2015-02-10 DIAGNOSIS — E876 Hypokalemia: Secondary | ICD-10-CM | POA: Diagnosis not present

## 2015-02-10 DIAGNOSIS — R51 Headache: Secondary | ICD-10-CM | POA: Diagnosis not present

## 2015-02-10 DIAGNOSIS — R5383 Other fatigue: Secondary | ICD-10-CM | POA: Diagnosis not present

## 2015-02-10 DIAGNOSIS — Z794 Long term (current) use of insulin: Secondary | ICD-10-CM

## 2015-02-10 HISTORY — DX: Type 2 diabetes mellitus without complications: E11.9

## 2015-02-10 LAB — CBC
HCT: 33.2 % — ABNORMAL LOW (ref 40.0–52.0)
Hemoglobin: 10.7 g/dL — ABNORMAL LOW (ref 13.0–18.0)
MCH: 26.9 pg (ref 26.0–34.0)
MCHC: 32.1 g/dL (ref 32.0–36.0)
MCV: 83.9 fL (ref 80.0–100.0)
PLATELETS: 301 10*3/uL (ref 150–440)
RBC: 3.96 MIL/uL — AB (ref 4.40–5.90)
RDW: 16.2 % — ABNORMAL HIGH (ref 11.5–14.5)
WBC: 8.3 10*3/uL (ref 3.8–10.6)

## 2015-02-10 LAB — COMPREHENSIVE METABOLIC PANEL
ALBUMIN: 3 g/dL — AB (ref 3.5–5.0)
ALT: 394 U/L — ABNORMAL HIGH (ref 17–63)
AST: 388 U/L — ABNORMAL HIGH (ref 15–41)
Alkaline Phosphatase: 406 U/L — ABNORMAL HIGH (ref 38–126)
Anion gap: 7 (ref 5–15)
BUN: 30 mg/dL — ABNORMAL HIGH (ref 6–20)
CHLORIDE: 107 mmol/L (ref 101–111)
CO2: 21 mmol/L — ABNORMAL LOW (ref 22–32)
CREATININE: 0.97 mg/dL (ref 0.61–1.24)
Calcium: 8.6 mg/dL — ABNORMAL LOW (ref 8.9–10.3)
Glucose, Bld: 54 mg/dL — ABNORMAL LOW (ref 65–99)
POTASSIUM: 3.8 mmol/L (ref 3.5–5.1)
SODIUM: 135 mmol/L (ref 135–145)
Total Bilirubin: 12.9 mg/dL — ABNORMAL HIGH (ref 0.3–1.2)
Total Protein: 7.2 g/dL (ref 6.5–8.1)

## 2015-02-10 LAB — GLUCOSE, CAPILLARY: GLUCOSE-CAPILLARY: 51 mg/dL — AB (ref 65–99)

## 2015-02-10 LAB — LIPASE, BLOOD: Lipase: 121 U/L — ABNORMAL HIGH (ref 11–51)

## 2015-02-10 LAB — AMMONIA: Ammonia: 45 umol/L — ABNORMAL HIGH (ref 9–35)

## 2015-02-10 MED ORDER — SODIUM CHLORIDE 0.9 % IV SOLN: INTRAVENOUS | Status: DC

## 2015-02-10 MED ORDER — ONDANSETRON HCL 4 MG/2ML IJ SOLN: 4.0000 mg | Freq: Four times a day (QID) | INTRAMUSCULAR | Status: DC | PRN

## 2015-02-10 MED ORDER — INSULIN ASPART 100 UNIT/ML ~~LOC~~ SOLN
0.0000 [IU] | Freq: Every day | SUBCUTANEOUS | Status: DC
  Administered 2015-02-11: 2 [IU] via SUBCUTANEOUS
  Administered 2015-02-12: 3 [IU] via SUBCUTANEOUS
  Administered 2015-02-14: 2 [IU] via SUBCUTANEOUS
  Filled 2015-02-10: qty 3
  Filled 2015-02-10 (×2): qty 2

## 2015-02-10 MED ORDER — ASPIRIN EC 81 MG PO TBEC
81.0000 mg | DELAYED_RELEASE_TABLET | Freq: Every day | ORAL | Status: DC
  Administered 2015-02-11 – 2015-02-12 (×2): 81 mg via ORAL
  Filled 2015-02-10 (×2): qty 1

## 2015-02-10 MED ORDER — PANTOPRAZOLE SODIUM 40 MG PO TBEC
40.0000 mg | DELAYED_RELEASE_TABLET | Freq: Every day | ORAL | Status: DC
  Administered 2015-02-11 – 2015-02-15 (×4): 40 mg via ORAL
  Filled 2015-02-10 (×4): qty 1

## 2015-02-10 MED ORDER — GABAPENTIN 300 MG PO CAPS
300.0000 mg | ORAL_CAPSULE | Freq: Two times a day (BID) | ORAL | Status: DC
  Administered 2015-02-11 – 2015-02-15 (×8): 300 mg via ORAL
  Filled 2015-02-10 (×9): qty 1

## 2015-02-10 MED ORDER — DEXTROSE-NACL 5-0.9 % IV SOLN
INTRAVENOUS | Status: DC
  Administered 2015-02-10 – 2015-02-13 (×6): via INTRAVENOUS

## 2015-02-10 MED ORDER — MELOXICAM 7.5 MG PO TABS
7.5000 mg | ORAL_TABLET | Freq: Every day | ORAL | Status: DC
  Administered 2015-02-12 – 2015-02-14 (×2): 7.5 mg via ORAL
  Filled 2015-02-10 (×3): qty 1

## 2015-02-10 MED ORDER — HEPARIN SODIUM (PORCINE) 5000 UNIT/ML IJ SOLN
5000.0000 [IU] | Freq: Three times a day (TID) | INTRAMUSCULAR | Status: DC
  Administered 2015-02-10 – 2015-02-13 (×8): 5000 [IU] via SUBCUTANEOUS
  Filled 2015-02-10 (×9): qty 1

## 2015-02-10 MED ORDER — HYDRALAZINE HCL 20 MG/ML IJ SOLN
10.0000 mg | INTRAMUSCULAR | Status: DC | PRN
  Administered 2015-02-13: 10 mg via INTRAVENOUS
  Filled 2015-02-10 (×2): qty 1

## 2015-02-10 MED ORDER — INSULIN ASPART 100 UNIT/ML ~~LOC~~ SOLN
0.0000 [IU] | Freq: Three times a day (TID) | SUBCUTANEOUS | Status: DC
Start: 2015-02-11 — End: 2015-02-15
  Administered 2015-02-11 – 2015-02-12 (×4): 2 [IU] via SUBCUTANEOUS
  Administered 2015-02-12 – 2015-02-13 (×3): 3 [IU] via SUBCUTANEOUS
  Administered 2015-02-13 – 2015-02-14 (×3): 5 [IU] via SUBCUTANEOUS
  Administered 2015-02-14: 2 [IU] via SUBCUTANEOUS
  Administered 2015-02-15: 5 [IU] via SUBCUTANEOUS
  Filled 2015-02-10: qty 2
  Filled 2015-02-10 (×2): qty 3
  Filled 2015-02-10: qty 2
  Filled 2015-02-10 (×2): qty 5
  Filled 2015-02-10: qty 3
  Filled 2015-02-10 (×2): qty 2
  Filled 2015-02-10: qty 5
  Filled 2015-02-10: qty 2
  Filled 2015-02-10 (×2): qty 5

## 2015-02-10 MED ORDER — INSULIN GLARGINE 100 UNIT/ML ~~LOC~~ SOLN
20.0000 [IU] | Freq: Two times a day (BID) | SUBCUTANEOUS | Status: DC
  Administered 2015-02-11 – 2015-02-15 (×9): 20 [IU] via SUBCUTANEOUS
  Filled 2015-02-10 (×14): qty 0.2

## 2015-02-10 MED ORDER — MORPHINE SULFATE (PF) 2 MG/ML IV SOLN
2.0000 mg | INTRAVENOUS | Status: DC | PRN
  Administered 2015-02-13: 2 mg via INTRAVENOUS
  Filled 2015-02-10: qty 1

## 2015-02-10 MED ORDER — OXYCODONE HCL 5 MG PO TABS
5.0000 mg | ORAL_TABLET | ORAL | Status: DC | PRN
  Administered 2015-02-11 – 2015-02-14 (×6): 5 mg via ORAL
  Filled 2015-02-10 (×6): qty 1

## 2015-02-10 MED ORDER — ONDANSETRON HCL 4 MG PO TABS: 4.0000 mg | ORAL_TABLET | Freq: Four times a day (QID) | ORAL | Status: DC | PRN

## 2015-02-10 NOTE — H&P (Signed)
[name] at [place] NAME: [name]    MR#:  [id number]  DATE OF BIRTH:  06-Mar-1941   DATE OF ADMISSION:  02/10/2015  PRIMARY CARE PHYSICIAN: Rusty Aus., MD   REQUESTING/REFERRING PHYSICIAN: Archie Balboa  CHIEF COMPLAINT:   Chief Complaint  Patient presents with  . Pancreatitis    HISTORY OF PRESENT ILLNESS:  [name]  is a 74 y.o. male with a known history of type 2 diabetes insulin requiring, coronary artery disease presenting with weakness. Benign progressive weakness for 1-2 week total duration with near-syncope most prominent in the morning. Has also been experiencing altered mental status described mainly as confusion and increased lethargy. [facility] PCP for the above findings had routine lab work which revealed markedly elevated bilirubin subsequently, underwent CT abdomen and pelvis which revealed pancreatic abnormality advised to Hospital further workup and evaluation. The patient himself is unable to provide meaningful information given mental status/medical condition history obtained from family members present at bedside. They also attests to have him having approximately one week of diarrhea however unable to comment on color. The patient has denied any abdominal pain nausea/vomiting throughout the duration of symptoms.  PAST MEDICAL HISTORY:   Past Medical History  Diagnosis Date  . Hyperlipidemia   . H/O cardiac catheterization 06/03/06,12/03/11  . BPH (benign prostatic hypertrophy)   . Anxiety   . Syncope     LAUGHING INDUCED  . Myocardial infarction ([place])     x 2  . CHF (congestive heart failure) ([place])   . CAD (coronary artery disease) 06/03/06    cypher 2.5 x 17mm DES for 80% mid RCA sees Dr. Ubaldo Glassing  . Asthma     as a child  . Sleep apnea     hx of  . Kidney stone     hx of  . GERD (gastroesophageal reflux disease)     on medication for  . Cancer ([place])     hx of skin ca in "corner of left  eye"  . Arthritis   . Anemia     taking iron  . Cataract, bilateral   . Neuromuscular disorder ([place])     diabetic neuropathy  . Diabetes mellitus without complication ([place])     PAST SURGICAL HISTORY:   Past Surgical History  Procedure Laterality Date  . Spine surgery  9/05    CERVICAL LAMINECTOMY  . Cervical herniated    . Eye surgery      as a child  . Coronary artery bypass graft  12/12/2011    Procedure: CORONARY ARTERY BYPASS GRAFTING (CABG);  Surgeon: Gaye Pollack, MD;  Location: [place];  Service: Open Heart Surgery;  Laterality: N/A;  coronary artery bypass graft times four using left internal mammary artery, right leg greater saphenous vein harvested endoscopically  . Cardiac catheterization N/A 10/26/2014    Procedure: Left Heart Cath;  Surgeon: Isaias Cowman, MD;  Location: [place] [facility];  Service: Cardiovascular;  Laterality: N/A;    SOCIAL HISTORY:   Social History  Substance Use Topics  . Smoking status: Former Smoker    Types: Cigarettes, Cigars    Quit date: 12/03/1977  . Smokeless tobacco: Never Used  . Alcohol Use: No    FAMILY HISTORY:   Family History  Problem Relation Age of Onset  . Diabetes Mellitus II Mother     Also Brother and sister  . Leukemia Father     DRUG ALLERGIES:   Allergies  Allergen Reactions  . Zithromax [Azithromycin] Anaphylaxis and Other (See Comments)    Reaction:  Stroke-like symptoms   . Lisinopril Other (See Comments)    Per spouse, pt did not tolerate well in the past.      REVIEW OF SYSTEMS:  Unable to obtain given patient's mental status/medical condition   MEDICATIONS AT HOME:   Prior to Admission medications   Medication Sig Start Date End Date Taking? Authorizing Provider  aspirin EC 81 MG tablet Take 81 mg by mouth daily.   Yes Historical Provider, MD  gabapentin (NEURONTIN) 300 MG capsule Take 300 mg by mouth 2 (two) times daily.   Yes Historical Provider, MD  glimepiride (AMARYL) 4 MG  tablet Take 4 mg by mouth daily.   Yes Historical Provider, MD  insulin glargine (LANTUS) 100 UNIT/ML injection Inject 40 Units into the skin 2 (two) times daily.   Yes Historical Provider, MD  insulin regular (NOVOLIN R,HUMULIN R) 100 units/mL injection Inject 15-20 Units into the skin 3 (three) times daily before meals. Pt uses as needed per sliding scale.   Yes Historical Provider, MD  lovastatin (MEVACOR) 40 MG tablet Take 40 mg by mouth at bedtime.   Yes Historical Provider, MD  meloxicam (MOBIC) 7.5 MG tablet Take 7.5 mg by mouth daily.   Yes Historical Provider, MD  Omega-3 Fatty Acids (FISH OIL) 1000 MG CAPS Take 1,000 mg by mouth 2 (two) times daily.   Yes Historical Provider, MD  omeprazole (PRILOSEC) 20 MG capsule Take 20 mg by mouth 2 (two) times daily.   Yes Historical Provider, MD  torsemide (DEMADEX) 20 MG tablet Take 20 mg by mouth 2 (two) times daily.    Yes Historical Provider, MD  carvedilol (COREG) 12.5 MG tablet Take 1 tablet (12.5 mg total) by mouth 2 (two) times daily with a meal. Patient not taking: Reported on 02/10/2015 10/26/14   Rusty Aus, MD  isosorbide mononitrate (IMDUR) 30 MG 24 hr tablet Take 1 tablet (30 mg total) by mouth daily. Patient not taking: Reported on 02/10/2015 10/26/14   Rusty Aus, MD      VITAL SIGNS:  Blood pressure 181/79, pulse 73, temperature 97.7 F (36.5 C), temperature source Oral, resp. rate 26, height 5\' 11"  (1.803 m), weight 198 lb (89.812 kg), SpO2 94 %.  PHYSICAL EXAMINATION:   VITAL SIGNS: Filed Vitals:   02/10/15 1930  BP: 181/79  Pulse: 73  Temp:   Resp: 26   GENERAL:74 y.o.male moderate distress given mental status.  HEAD: Normocephalic, atraumatic.  EYES: Pupils equal, round, reactive to light. Unable to assess extraocular muscles given mental status/medical condition. Positive scleral icterus.  MOUTH: Dry mucosal membrane. Dentition intact. No abscess noted.  EAR, NOSE, THROAT: Clear without exudates. No external  lesions.  NECK: Supple. No thyromegaly. No nodules. No JVD.  PULMONARY: Clear to ascultation, without wheeze rails or rhonci. No use of accessory muscles, Good respiratory effort. good air entry bilaterally CHEST: Nontender to palpation.  CARDIOVASCULAR: S1 and S2. Regular rate and rhythm. No murmurs, rubs, or gallops. No edema. Pedal pulses 2+ bilaterally.  GASTROINTESTINAL: Soft, nontender, nondistended. No masses. Positive bowel sounds. No hepatosplenomegaly.  MUSCULOSKELETAL: No swelling, clubbing, or edema. Range of motion full in all extremities.  NEUROLOGIC: Unable to assess given mental status/medical condition SKIN: Some telangiectasia present on chest, marketed full body jaundice No further ulceration, lesions, rashes, or cyanosis. Skin warm and dry. Turgor intact.  PSYCHIATRIC: Unable to assess given mental status/medical condition  LABORATORY PANEL:   CBC  Recent Labs Lab 02/10/15 1717  WBC 8.3  HGB 10.7*  HCT 33.2*  PLT 301   ------------------------------------------------------------------------------------------------------------------  Chemistries   Recent Labs Lab 02/10/15 1717  NA 135  K 3.8  CL 107  CO2 21*  GLUCOSE 54*  BUN 30*  CREATININE 0.97  CALCIUM 8.6*  AST 388*  ALT 394*  ALKPHOS 406*  BILITOT 12.9*   ------------------------------------------------------------------------------------------------------------------  Cardiac Enzymes No results for input(s): TROPONINI in the last 168 hours. ------------------------------------------------------------------------------------------------------------------  RADIOLOGY:  Ct Abdomen Wo Contrast  02/10/2015  CLINICAL DATA:  Elevated LFTs and bilirubin, jaundice EXAM: CT ABDOMEN WITHOUT CONTRAST TECHNIQUE: Multidetector CT imaging of the abdomen was performed following the standard protocol without IV contrast. Sagittal and coronal MPR images reconstructed from axial data set. Patient drank  dilute oral contrast for exam. COMPARISON:  None. FINDINGS: Minimal atelectasis dependently LEFT lower lobe. Liver, spleen, kidneys and adrenal glands unremarkable for noncontrast technique. Normal appendix. Heterogeneous region of low attenuation at the pancreatic body extending into the proximal tail could represent focal pancreatitis though a more focal area of low attenuation 21 x 17 mm is identified, cannot exclude tumor. No definite biliary dilatation, CBD 12 mm diameter. No definite biliary tract calcifications. Tiny hiatal hernia. Visualized bowel loops unremarkable. Scattered atherosclerotic calcifications. No additional mass, adenopathy free air or free fluid. IMPRESSION: Abnormal appearance to the pancreatic body which could be related to focal pancreatitis though mass/ tumor not excluded ; MR imaging recommended to exclude pancreatic neoplasm. Mild biliary dilatation, can evaluate by MRCP sequence at time of MR imaging. Electronically Signed   By: Lavonia Dana M.D.   On: 02/10/2015 16:26   Ct Head Wo Contrast  02/10/2015  CLINICAL DATA:  Altered mental status EXAM: CT HEAD WITHOUT CONTRAST TECHNIQUE: Contiguous axial images were obtained from the base of the skull through the vertex without intravenous contrast. COMPARISON:  None. FINDINGS: Motion degraded images. No evidence of parenchymal hemorrhage or extra-axial fluid collection. No mass lesion, mass effect, or midline shift. No CT evidence of acute infarction. Mild cortical atrophy.  No ventriculomegaly. The visualized paranasal sinuses are essentially clear. The mastoid air cells are unopacified. No evidence of calvarial fracture. IMPRESSION: Motion degraded images. No evidence of acute intracranial abnormality. Mild cortical atrophy. Electronically Signed   By: Julian Hy M.D.   On: 02/10/2015 20:27   US Carotid Bilateral  02/10/2015  CLINICAL DATA:  Syncope and history of diabetes, coronary artery disease and prior CABG. EXAM:  BILATERAL CAROTID DUPLEX ULTRASOUND TECHNIQUE: Pearline Cables scale imaging, color Doppler and duplex ultrasound were performed of bilateral carotid and vertebral arteries in the neck. COMPARISON:  None. FINDINGS: Criteria: Quantification of carotid stenosis is based on velocity parameters that correlate the residual internal carotid diameter with NASCET-based stenosis levels, using the diameter of the distal internal carotid lumen as the denominator for stenosis measurement. The following velocity measurements were obtained: RIGHT ICA:  73/12 cm/sec CCA:  40/9 cm/sec SYSTOLIC ICA/CCA RATIO:  1.1 DIASTOLIC ICA/CCA RATIO:  1.3 ECA:  164 cm/sec LEFT ICA:  105/18 cm/sec CCA:  81/19 cm/sec SYSTOLIC ICA/CCA RATIO:  1.2 DIASTOLIC ICA/CCA RATIO:  1.0 ECA:  147 cm/sec RIGHT CAROTID ARTERY: Mild amount of calcified plaque is present at the level of the distal bulb and proximal ICA. Velocities and waveforms are normal and estimated right ICA stenosis is less than 50%. RIGHT VERTEBRAL ARTERY: Antegrade flow with normal waveform and velocity. LEFT CAROTID ARTERY: There is a mild amount of  calcified plaque at the level of the left carotid bulb and proximal ICA. Overall plaque volume is slightly greater on the left compared to the right. Velocities and waveforms are normal and estimated left ICA stenosis is less than 50%. LEFT VERTEBRAL ARTERY: Antegrade flow with normal waveform and velocity. IMPRESSION: Mild amount of plaque at the level of both carotid bulbs and proximal internal carotid arteries, left greater than right. No significant carotid stenosis is identified with estimated bilateral ICA stenoses of less than 50%. Electronically Signed   By: Aletta Edouard M.D.   On: 02/10/2015 17:15    EKG:   Orders placed or performed during the hospital encounter of 02/10/15  . ED EKG  . ED EKG    IMPRESSION AND PLAN:   74 year old Caucasian gentleman history of type 2 diabetes insulin requiring presenting with weakness and  pancreatic abnormality  1. Painless Obstructive jaundice: Primary concern is for malignancy, ideally would check MRI/MRCP however the patient has known cervical hardware will consult gastroenterology for potential EUS. Avoid statin therapy or hepatotoxic agents 2. Encephalopathy: Check ammonia level, possibly secondary to elevated bilirubin 3. Type 2 diabetes insulin requiring: Given patient's poor by mouth intake with decreased basal insulin, hold oral agents at insulin sliding scale 4. Coronary artery disease without angina: Aspirin statin beta-blockade 5. Venous embolism prophylactic: Heparin subcutaneous    All the records are reviewed and case discussed with ED provider. Management plans discussed with the patient, family and they are in agreement.  CODE STATUS: Full  TOTAL TIME TAKING CARE OF THIS PATIENT: 45 minutes.    Merlen Gurry,  Karenann Cai.D on 02/10/2015 at 9:31 PM  Between 7am to 6pm - Pager - [phone]  After 6pm: House Pager: - [phone]  [facility]  Office  [phone]  CC: Primary care physician; Rusty Aus., MD

## 2015-02-10 NOTE — ED Provider Notes (Signed)
[facility] Emergency Department Provider Note   ____________________________________________  Time seen: 1935  I have reviewed the triage vital signs and the nursing notes.   HISTORY  Chief Complaint Pancreatitis   History limited by: Altered Mental Status. History obtained from family    HPI [name] is a 74 y.o. male who comes into the emergency department today at the request of primary care doctor because of abnormal CT scan and blood work. The family states that the patient has been getting worse over the past number of days. They state that the patient has had increased weakness and change in behavior. He has been more confused. They state that this is been going on for the past couple of weeks. He was seen in the emergency Department. This month and diagnosed with dehydration per the family. The wife states that she only noticed that he was more yellow today. They state that he did have a history of drinking and smoking however stopped 40 years ago. No numbness any fevers. They have never noticed these symptoms before in the past.    Past Medical History  Diagnosis Date  . Hyperlipidemia   . H/O cardiac catheterization 06/03/06,12/03/11  . BPH (benign prostatic hypertrophy)   . Anxiety   . Syncope     LAUGHING INDUCED  . Myocardial infarction ([place])     x 2  . CHF (congestive heart failure) ([place])   . CAD (coronary artery disease) 06/03/06    cypher 2.5 x 23mm DES for 80% mid RCA sees Dr. Ubaldo Glassing  . Asthma     as a child  . Sleep apnea     hx of  . Kidney stone     hx of  . GERD (gastroesophageal reflux disease)     on medication for  . Cancer ([place])     hx of skin ca in "corner of left eye"  . Arthritis   . Anemia     taking iron  . Cataract, bilateral   . Neuromuscular disorder ([place])     diabetic neuropathy  . Diabetes mellitus without complication [facility])     Patient Active Problem List   Diagnosis Date Noted  . Angina at rest  [facility]) 10/26/2014  . Unstable angina ([place]) 10/26/2014  . 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 Diabetes mellitus   . Hyperlipidemia   . CAD (coronary artery disease)   . Sleep apnea   . Asthma   . BPH (benign prostatic hypertrophy)   . Anxiety   . Syncope     Past Surgical History  Procedure Laterality Date  . Spine surgery  9/05    CERVICAL LAMINECTOMY  . Cervical herniated    . Eye surgery      as a child  . Coronary artery bypass graft  12/12/2011    Procedure: CORONARY ARTERY BYPASS GRAFTING (CABG);  Surgeon: Gaye Pollack, MD;  Location: [place];  Service: Open Heart Surgery;  Laterality: N/A;  coronary artery bypass graft times four using left internal mammary artery, right leg greater saphenous vein harvested endoscopically  . Cardiac catheterization N/A 10/26/2014    Procedure: Left Heart Cath;  Surgeon: Isaias Cowman, MD;  Location: [place] [facility];  Service: Cardiovascular;  Laterality: N/A;    Current Outpatient Rx  Name  Route  Sig  Dispense  Refill  . aspirin EC 81 MG tablet   Oral   Take 81 mg by mouth daily.         Marland Kitchen  carvedilol (COREG) 12.5 MG tablet   Oral   Take 1 tablet (12.5 mg total) by mouth 2 (two) times daily with a meal.   60 tablet   11   . fish oil-omega-3 fatty acids 1000 MG capsule   Oral   Take 1 g by mouth 2 (two) times daily.         Marland Kitchen gabapentin (NEURONTIN) 300 MG capsule   Oral   Take 300 mg by mouth 2 (two) times daily.         . insulin aspart (NOVOLOG) 100 UNIT/ML injection   Subcutaneous   Inject 4-5 Units into the skin 3 (three) times daily before meals. Per sliding scale         . insulin glargine (LANTUS) 100 UNIT/ML injection   Subcutaneous   Inject 30 Units into the skin 2 (two) times daily.   10 mL      . Iron-Vitamin C (VITRON-C) 65-125 MG TABS   Oral   Take 1 tablet by mouth every morning.          . isosorbide mononitrate (IMDUR) 30 MG 24 hr tablet   Oral   Take 1 tablet (30 mg total) by mouth daily.    30 tablet   11   . lovastatin (MEVACOR) 20 MG tablet   Oral   Take 40 mg by mouth at bedtime.         . meloxicam (MOBIC) 15 MG tablet   Oral   Take 7.5 mg by mouth every morning.          Marland Kitchen omeprazole (PRILOSEC) 20 MG capsule   Oral   Take 20 mg by mouth 2 (two) times daily.         Marland Kitchen torsemide (DEMADEX) 20 MG tablet   Oral   Take 40 mg by mouth daily.         . traMADol (ULTRAM) 50 MG tablet   Oral   Take 50 mg by mouth every 6 (six) hours as needed.           Allergies Zithromax and Lisinopril  Family History  Problem Relation Age of Onset  . Diabetes Mellitus II Mother     Also Brother and sister  . Leukemia Father     Social History Social History  Substance Use Topics  . Smoking status: Former Smoker    Types: Cigarettes, Cigars    Quit date: 12/03/1977  . Smokeless tobacco: Never Used  . Alcohol Use: No    Review of Systems Unable to obtain secondary to altered mental status.  ____________________________________________   PHYSICAL EXAM:  VITAL SIGNS: ED Triage Vitals  Enc Vitals Group     BP 02/10/15 1706 166/66 mmHg     Pulse Rate 02/10/15 1706 66     Resp --      Temp 02/10/15 1706 97.7 F (36.5 C)     Temp Source 02/10/15 1706 Oral     SpO2 02/10/15 1706 97 %     Weight 02/10/15 1706 198 lb (89.812 kg)     Height 02/10/15 1706 5\' 11"  (1.803 m)     Head Cir --      Peak Flow --      Pain Score 02/10/15 1750 3   Constitutional: Awake will occasionally open eyes to verbal commands. Eyes: Scleral icterus. PERRL. ENT   Head: Normocephalic and atraumatic.   Nose: No congestion/rhinnorhea.   Mouth/Throat: Mucous membranes are moist.   Neck: No stridor. Hematological/Lymphatic/Immunilogical:  No cervical lymphadenopathy. Cardiovascular: Normal rate, regular rhythm.  No murmurs, rubs, or gallops. Respiratory: Normal respiratory effort without tachypnea nor retractions. Breath sounds are clear and equal bilaterally.  No wheezes/rales/rhonchi. Gastrointestinal: Soft and nontender. No distention.  Genitourinary: Deferred Musculoskeletal: No joint effusions.  Trace bilateral pitting edema Neurologic:  Awake, will occasionally open eyes to verbal commands. Will not follow further commands. Sensation appears to be grossly intact. Patient nonverbal with me. Skin:  Diffuse jaundice   ____________________________________________    LABS (pertinent positives/negatives)  Labs Reviewed  LIPASE, BLOOD - Abnormal; Notable for the following:    Lipase 121 (*)    All other components within normal limits  COMPREHENSIVE METABOLIC PANEL - Abnormal; Notable for the following:    CO2 21 (*)    Glucose, Bld 54 (*)    BUN 30 (*)    Calcium 8.6 (*)    Albumin 3.0 (*)    AST 388 (*)    ALT 394 (*)    Alkaline Phosphatase 406 (*)    Total Bilirubin 12.9 (*)    All other components within normal limits  CBC - Abnormal; Notable for the following:    RBC 3.96 (*)    Hemoglobin 10.7 (*)    HCT 33.2 (*)    RDW 16.2 (*)    All other components within normal limits  URINALYSIS COMPLETEWITH MICROSCOPIC ([facility] ONLY)  PROTIME-INR  AMMONIA  CBC  CREATININE, SERUM  COMPREHENSIVE METABOLIC PANEL  CBC     ____________________________________________   EKG  Labs Reviewed  LIPASE, BLOOD - Abnormal; Notable for the following:    Lipase 121 (*)    All other components within normal limits  COMPREHENSIVE METABOLIC PANEL - Abnormal; Notable for the following:    CO2 21 (*)    Glucose, Bld 54 (*)    BUN 30 (*)    Calcium 8.6 (*)    Albumin 3.0 (*)    AST 388 (*)    ALT 394 (*)    Alkaline Phosphatase 406 (*)    Total Bilirubin 12.9 (*)    All other components within normal limits  CBC - Abnormal; Notable for the following:    RBC 3.96 (*)    Hemoglobin 10.7 (*)    HCT 33.2 (*)    RDW 16.2 (*)    All other components within normal limits  URINALYSIS COMPLETEWITH MICROSCOPIC ([facility] ONLY)  PROTIME-INR   AMMONIA  CBC  CREATININE, SERUM  COMPREHENSIVE METABOLIC PANEL  CBC     ____________________________________________    RADIOLOGY  CT head  IMPRESSION: Motion degraded images.  No evidence of acute intracranial abnormality.  Mild cortical atrophy.  ____________________________________________   PROCEDURES  Procedure(s) performed: None  Critical Care performed: No  ____________________________________________   INITIAL IMPRESSION / ASSESSMENT AND PLAN / ED COURSE  Pertinent labs & imaging results that were available during my care of the patient were reviewed by me and considered in my medical decision making (see chart for details).  Patient presents to the emergency department today with concern for abnormal CT scan and blood work. CT scan does show concern for pancreatitis first pancreatic mass. Patient's liver enzymes and lipase were both elevated. Patient is jaundiced consistent with the blood work. At this point unclear etiology of the symptoms however I do have concerns for pancreatic cancer. Plan to admit patient to the hospital for further workup and management.  ____________________________________________   FINAL CLINICAL IMPRESSION(S) / ED DIAGNOSES  Final diagnoses:  Jaundice  Altered  mental status, unspecified altered mental status type  Transaminitis     Nance Pear, MD 02/10/15 2118

## 2015-02-10 NOTE — ED Notes (Signed)
Pt came from Dr Ammie Ferrier office, pt had a CT which showed pancreatitis, pt reports generalized weakness, pt is jaundice with a distended abdomen

## 2015-02-11 DIAGNOSIS — R4182 Altered mental status, unspecified: Secondary | ICD-10-CM

## 2015-02-11 DIAGNOSIS — Z87891 Personal history of nicotine dependence: Secondary | ICD-10-CM

## 2015-02-11 DIAGNOSIS — E785 Hyperlipidemia, unspecified: Secondary | ICD-10-CM

## 2015-02-11 DIAGNOSIS — R630 Anorexia: Secondary | ICD-10-CM

## 2015-02-11 DIAGNOSIS — R7989 Other specified abnormal findings of blood chemistry: Secondary | ICD-10-CM

## 2015-02-11 DIAGNOSIS — R634 Abnormal weight loss: Secondary | ICD-10-CM

## 2015-02-11 DIAGNOSIS — Z7982 Long term (current) use of aspirin: Secondary | ICD-10-CM

## 2015-02-11 DIAGNOSIS — F419 Anxiety disorder, unspecified: Secondary | ICD-10-CM

## 2015-02-11 LAB — URINALYSIS COMPLETE WITH MICROSCOPIC (ARMC ONLY)
Bacteria, UA: NONE SEEN
Glucose, UA: 150 mg/dL — AB
Hgb urine dipstick: NEGATIVE
KETONES UR: NEGATIVE mg/dL
Leukocytes, UA: NEGATIVE
Nitrite: NEGATIVE
PROTEIN: NEGATIVE mg/dL
Specific Gravity, Urine: 1.014 (ref 1.005–1.030)
pH: 5 (ref 5.0–8.0)

## 2015-02-11 LAB — COMPREHENSIVE METABOLIC PANEL
ALBUMIN: 2.7 g/dL — AB (ref 3.5–5.0)
ALK PHOS: 389 U/L — AB (ref 38–126)
ALT: 329 U/L — ABNORMAL HIGH (ref 17–63)
AST: 309 U/L — AB (ref 15–41)
Anion gap: 9 (ref 5–15)
BILIRUBIN TOTAL: 11.6 mg/dL — AB (ref 0.3–1.2)
BUN: 24 mg/dL — AB (ref 6–20)
CALCIUM: 8.6 mg/dL — AB (ref 8.9–10.3)
CO2: 21 mmol/L — ABNORMAL LOW (ref 22–32)
CREATININE: 0.92 mg/dL (ref 0.61–1.24)
Chloride: 105 mmol/L (ref 101–111)
GFR calc Af Amer: >60 mL/min (ref >60)
GLUCOSE: 150 mg/dL — AB (ref 65–99)
Potassium: 2.8 mmol/L — CL (ref 3.5–5.1)
Sodium: 135 mmol/L (ref 135–145)
TOTAL PROTEIN: 6.6 g/dL (ref 6.5–8.1)

## 2015-02-11 LAB — CBC
HCT: 32.1 % — ABNORMAL LOW (ref 40.0–52.0)
Hemoglobin: 10.5 g/dL — ABNORMAL LOW (ref 13.0–18.0)
MCH: 27.3 pg (ref 26.0–34.0)
MCHC: 32.7 g/dL (ref 32.0–36.0)
MCV: 83.5 fL (ref 80.0–100.0)
PLATELETS: 268 10*3/uL (ref 150–440)
RBC: 3.85 MIL/uL — ABNORMAL LOW (ref 4.40–5.90)
RDW: 16.7 % — AB (ref 11.5–14.5)
WBC: 7.5 10*3/uL (ref 3.8–10.6)

## 2015-02-11 LAB — PROTIME-INR
INR: 1.11
PROTHROMBIN TIME: 14.5 s (ref 11.4–15.0)

## 2015-02-11 LAB — GLUCOSE, CAPILLARY
GLUCOSE-CAPILLARY: 178 mg/dL — AB (ref 65–99)
Glucose-Capillary: 169 mg/dL — ABNORMAL HIGH (ref 65–99)
Glucose-Capillary: 182 mg/dL — ABNORMAL HIGH (ref 65–99)
Glucose-Capillary: 206 mg/dL — ABNORMAL HIGH (ref 65–99)

## 2015-02-11 MED ORDER — POTASSIUM CHLORIDE 10 MEQ/100ML IV SOLN
10.0000 meq | INTRAVENOUS | Status: AC
  Administered 2015-02-11 (×4): 10 meq via INTRAVENOUS
  Filled 2015-02-11 (×4): qty 100

## 2015-02-11 NOTE — Progress Notes (Signed)
PROGRESS NOTE  [name] YIF:[id number] DOB: 1941-04-02 DOA: 07-Mar-2015 PCP: Rusty Aus., MD  Subjective 74 y/o male with hx of Type 2 DM, CAD admitted with Painless jaundice  weakness, lethargy and confusion. CT abdomen showed abnormal appearance of Pancreatic body ? Pancreatitis vs tumor Consultants:  GI  Oncology   Objective: BP 167/74 mmHg  Pulse 69  Temp(Src) 94.8 F (34.9 C) (Rectal)  Resp 20  Ht 5\' 11"  (1.803 m)  Wt 91.264 kg (201 lb 3.2 oz)  BMI 28.07 kg/m2  SpO2 97%  Intake/Output Summary (Last 24 hours) at 02/11/15 1126 Last data filed at 02/11/15 0816  Gross per 24 hour  Intake 577.93 ml  Output    650 ml  Net -72.07 ml   Filed Weights   03/07/2015 1706 Mar 07, 2015 2236  Weight: 89.812 kg (198 lb) 91.264 kg (201 lb 3.2 oz)    Exam:  General: Well developed, well nourished male, in NAD HEENT: PERRL; OP moist without lesions. Neck:  trachea midline, no thyromegaly Chest: normal to palpation Lungs: clear bilaterally without retractions or wheezes Cardiovascular: RRR, no murmur, no gallop Abdomen: soft, nontender, nondistended, positive bowel sounds Extremities: no clubbing, cyanosis, edema Neuro: alert and oriented, moves all extremities Derm: Jaundiced skin noted. Few telengectasia noted Lymph: no cervical or supraclavicular lymphadenopathy   Labs and imaging studies were reviewed*  Data Reviewed: Basic Metabolic Panel:  Recent Labs Lab 2015/03/07 1717 02/11/15 0422  NA 135 135  K 3.8 2.8*  CL 107 105  CO2 21* 21*  GLUCOSE 54* 150*  BUN 30* 24*  CREATININE 0.97 0.92  CALCIUM 8.6* 8.6*   Liver Function Tests:  Recent Labs Lab 2015/03/07 1717 02/11/15 0422  AST 388* 309*  ALT 394* 329*  ALKPHOS 406* 389*  BILITOT 12.9* 11.6*  PROT 7.2 6.6  ALBUMIN 3.0* 2.7*    Recent Labs Lab 03/07/2015 1717  LIPASE 121*    Recent Labs Lab 03/07/15 2154  AMMONIA 45*   CBC:  Recent Labs Lab 2015-03-07 1717 02/11/15 0422  WBC  8.3 7.5  HGB 10.7* 10.5*  HCT 33.2* 32.1*  MCV 83.9 83.5  PLT 301 268   Cardiac Enzymes:   No results for input(s): CKTOTAL, CKMB, CKMBINDEX, TROPONINI in the last 168 hours. BNP (last 3 results)  Recent Labs  10/25/14 2326  BNP 53.0    ProBNP (last 3 results) No results for input(s): PROBNP in the last 8760 hours.  CBG:  Recent Labs Lab 07-Mar-2015 2329 02/11/15 0255  GLUCAP 51* 178*    No results found for this or any previous visit (from the past 240 hour(s)).   Studies: Ct Abdomen Wo Contrast  03/07/2015  CLINICAL DATA:  Elevated LFTs and bilirubin, jaundice EXAM: CT ABDOMEN WITHOUT CONTRAST TECHNIQUE: Multidetector CT imaging of the abdomen was performed following the standard protocol without IV contrast. Sagittal and coronal MPR images reconstructed from axial data set. Patient drank dilute oral contrast for exam. COMPARISON:  None. FINDINGS: Minimal atelectasis dependently LEFT lower lobe. Liver, spleen, kidneys and adrenal glands unremarkable for noncontrast technique. Normal appendix. Heterogeneous region of low attenuation at the pancreatic body extending into the proximal tail could represent focal pancreatitis though a more focal area of low attenuation 21 x 17 mm is identified, cannot exclude tumor. No definite biliary dilatation, CBD 12 mm diameter. No definite biliary tract calcifications. Tiny hiatal hernia. Visualized bowel loops unremarkable. Scattered atherosclerotic calcifications. No additional mass, adenopathy free air or free fluid. IMPRESSION: Abnormal appearance to the  pancreatic body which could be related to focal pancreatitis though mass/ tumor not excluded ; MR imaging recommended to exclude pancreatic neoplasm. Mild biliary dilatation, can evaluate by MRCP sequence at time of MR imaging. Electronically Signed   By: Lavonia Dana M.D.   On: 02/10/2015 16:26   Ct Head Wo Contrast  02/10/2015  CLINICAL DATA:  Altered mental status EXAM: CT HEAD WITHOUT  CONTRAST TECHNIQUE: Contiguous axial images were obtained from the base of the skull through the vertex without intravenous contrast. COMPARISON:  None. FINDINGS: Motion degraded images. No evidence of parenchymal hemorrhage or extra-axial fluid collection. No mass lesion, mass effect, or midline shift. No CT evidence of acute infarction. Mild cortical atrophy.  No ventriculomegaly. The visualized paranasal sinuses are essentially clear. The mastoid air cells are unopacified. No evidence of calvarial fracture. IMPRESSION: Motion degraded images. No evidence of acute intracranial abnormality. Mild cortical atrophy. Electronically Signed   By: Julian Hy M.D.   On: 02/10/2015 20:27   US Carotid Bilateral  02/10/2015  CLINICAL DATA:  Syncope and history of diabetes, coronary artery disease and prior CABG. EXAM: BILATERAL CAROTID DUPLEX ULTRASOUND TECHNIQUE: Pearline Cables scale imaging, color Doppler and duplex ultrasound were performed of bilateral carotid and vertebral arteries in the neck. COMPARISON:  None. FINDINGS: Criteria: Quantification of carotid stenosis is based on velocity parameters that correlate the residual internal carotid diameter with NASCET-based stenosis levels, using the diameter of the distal internal carotid lumen as the denominator for stenosis measurement. The following velocity measurements were obtained: RIGHT ICA:  73/12 cm/sec CCA:  66/0 cm/sec SYSTOLIC ICA/CCA RATIO:  1.1 DIASTOLIC ICA/CCA RATIO:  1.3 ECA:  164 cm/sec LEFT ICA:  105/18 cm/sec CCA:  63/01 cm/sec SYSTOLIC ICA/CCA RATIO:  1.2 DIASTOLIC ICA/CCA RATIO:  1.0 ECA:  147 cm/sec RIGHT CAROTID ARTERY: Mild amount of calcified plaque is present at the level of the distal bulb and proximal ICA. Velocities and waveforms are normal and estimated right ICA stenosis is less than 50%. RIGHT VERTEBRAL ARTERY: Antegrade flow with normal waveform and velocity. LEFT CAROTID ARTERY: There is a mild amount of calcified plaque at the level of  the left carotid bulb and proximal ICA. Overall plaque volume is slightly greater on the left compared to the right. Velocities and waveforms are normal and estimated left ICA stenosis is less than 50%. LEFT VERTEBRAL ARTERY: Antegrade flow with normal waveform and velocity. IMPRESSION: Mild amount of plaque at the level of both carotid bulbs and proximal internal carotid arteries, left greater than right. No significant carotid stenosis is identified with estimated bilateral ICA stenoses of less than 50%. Electronically Signed   By: Aletta Edouard M.D.   On: 02/10/2015 17:15    Scheduled Meds: . aspirin EC  81 mg Oral Daily  . gabapentin  300 mg Oral BID  . heparin  5,000 Units Subcutaneous 3 times per day  . insulin aspart  0-5 Units Subcutaneous QHS  . insulin aspart  0-9 Units Subcutaneous TID WC  . insulin glargine  20 Units Subcutaneous BID  . meloxicam  7.5 mg Oral Daily  . pantoprazole  40 mg Oral Daily    Continuous Infusions: . dextrose 5 % and 0.9% NaCl 100 mL/hr at 02/10/15 2145    Assessment/Plan: 1 Painless Obstructive jaundice concerning for pancreatic malignancy Elevated liver enzymes and biliubin- slight improvement noted MRCP not be possible because of cervical hardware/Laminectomy May need ERCP Consult GI and Oncology 2 Altered mental status with weakness; States he is feeling  better  Se Ammonia ;45-No obvious focus of infection 3 Type 2 DM; On SSI 4CAD: Stable  5 HTN: Continue to monitor 6Hypokalemia- Replace   Code Status: Full      T   02/11/2015, 11:26 AM  LOS: 1 day

## 2015-02-11 NOTE — Consult Note (Signed)
GI Inpatient Consult Note  Reason for Consult:  Obstructive jaundice   Attending Requesting Consult: Hande  History of Present Illness: [name] is a 74 y.o. male with below past medical history presenting for evaluation of painless jaundice and altered mental status. Per the family and the patient, he was noticed to be developing jaundice about 2-3 weeks ago. As also lost about 20 pounds in the past month some of this they attributed to his diuretic medication. He had been having issues with constipation, however the past several days this has flipped to loose stools. He has not had any abdominal pain and his appetite has continued to be good. He was noted to be confused at home and this prompted presentation to the emergency room. He denies nausea, vomiting, dysphagia, GERD, rectal bleeding, melena.  In the ED he was noted to have lab studies suggestive of obstructive jaundice. He had a CT of his abdomen and possible low-attenuation lesion in the body and tail versus pancreatitis and dilation of the common bile duct to 12 mm. Was admitted to the hospital for further management.`  His mom does have a history of pancreatic cancer.  Past Medical History:  Past Medical History  Diagnosis Date  . Hyperlipidemia   . H/O cardiac catheterization 06/03/06,12/03/11  . BPH (benign prostatic hypertrophy)   . Anxiety   . Syncope     LAUGHING INDUCED  . Myocardial infarction ([place])     x 2  . CHF (congestive heart failure) ([place])   . CAD (coronary artery disease) 06/03/06    cypher 2.5 x 52mm DES for 80% mid RCA sees Dr. Ubaldo Glassing  . Asthma     as a child  . Sleep apnea     hx of  . Kidney stone     hx of  . GERD (gastroesophageal reflux disease)     on medication for  . 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 Arthritis   . Anemia     taking iron  . Cataract, bilateral   . Neuromuscular disorder ([employer])     diabetic neuropathy  . Diabetes mellitus without complication  [facility])     Problem List: Patient Active Problem List   Diagnosis Date Noted  . Obstructive jaundice 02/10/2015  . Angina at [facility]) 10/26/2014  . 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 Diabetes mellitus   . Hyperlipidemia   . CAD (coronary artery disease)   . Sleep apnea   . Asthma   . BPH (benign prostatic hypertrophy)   . Anxiety   . Syncope     Past Surgical History: Past Surgical History  Procedure Laterality Date  . Spine surgery  9/05    CERVICAL LAMINECTOMY  . Cervical herniated    . Eye surgery      as a child  . Coronary artery bypass graft  12/12/2011    Procedure: CORONARY ARTERY BYPASS GRAFTING (CABG);  Surgeon: Gaye Pollack, MD;  Location: [place];  Service: Open Heart Surgery;  Laterality: N/A;  coronary artery bypass graft times four using left internal mammary artery, right leg greater saphenous vein harvested endoscopically  . Cardiac catheterization N/A 10/26/2014    Procedure: Left Heart Cath;  Surgeon: Isaias Cowman, MD;  Location: [place] [facility] LAB;  Service: Cardiovascular;  Laterality: N/A;    Allergies: Allergies  Allergen Reactions  . Zithromax [Azithromycin] Anaphylaxis and Other (See Comments)    Reaction:  Stroke-like symptoms   . Lisinopril Other (See Comments)    Per spouse, pt did not tolerate well in the past.      Home Medications: Prescriptions prior to admission  Medication Sig Dispense Refill Last Dose  . aspirin EC 81 MG tablet Take 81 mg by mouth daily.   02/10/2015 at 0700  . gabapentin (NEURONTIN) 300 MG capsule Take 300 mg by mouth 2 (two) times daily.   02/10/2015 at Unknown time  . glimepiride (AMARYL) 4 MG tablet Take 4 mg by mouth daily.   02/10/2015 at Unknown time  . insulin glargine (LANTUS) 100 UNIT/ML injection Inject 40 Units into the skin 2 (two) times daily.   02/10/2015 at Unknown time  . insulin regular (NOVOLIN R,HUMULIN R) 100 units/mL injection Inject 15-20 Units into the skin 3 (three) times daily before meals. Pt  uses as needed per sliding scale.   02/10/2015 at Unknown time  . lovastatin (MEVACOR) 40 MG tablet Take 40 mg by mouth at bedtime.   02/09/2015 at Unknown time  . meloxicam (MOBIC) 7.5 MG tablet Take 7.5 mg by mouth daily.   02/10/2015 at Unknown time  . Omega-3 Fatty Acids (FISH OIL) 1000 MG CAPS Take 1,000 mg by mouth 2 (two) times daily.   02/10/2015 at Unknown time  . omeprazole (PRILOSEC) 20 MG capsule Take 20 mg by mouth 2 (two) times daily.   02/10/2015 at Unknown time  . torsemide (DEMADEX) 20 MG tablet Take 20 mg by mouth 2 (two) times daily.    02/09/2015 at Unknown time  . carvedilol (COREG) 12.5 MG tablet Take 1 tablet (12.5 mg total) by mouth 2 (two) times daily with a meal. (Patient not taking: Reported on 02/10/2015) 60 tablet 11   . isosorbide mononitrate (IMDUR) 30 MG 24 hr tablet Take 1 tablet (30 mg total) by mouth daily. (Patient not taking: Reported on 02/10/2015) 30 tablet 11      Scheduled Inpatient Medications:   . aspirin EC  81 mg Oral Daily  . gabapentin  300 mg Oral BID  . heparin  5,000 Units Subcutaneous 3 times per day  . insulin aspart  0-5 Units Subcutaneous QHS  . insulin aspart  0-9 Units Subcutaneous TID WC  . insulin glargine  20 Units Subcutaneous BID  . meloxicam  7.5 mg Oral Daily  . pantoprazole  40 mg Oral Daily    Continuous Inpatient Infusions:   . dextrose 5 % and 0.9% NaCl 100 mL/hr at 02/10/15 2145    PRN Inpatient Medications:  hydrALAZINE, morphine injection, ondansetron **OR** ondansetron (ZOFRAN) IV, oxyCODONE  Family History: family history includes Diabetes Mellitus II in his mother; Leukemia in his father.  The patient's family history is pos for Mom with pancreatic cancer.   Social History:   reports that he quit smoking about 37 years ago. His smoking use included Cigarettes and Cigars. He has never used smokeless tobacco. He reports that he does not drink alcohol or use illicit drugs.   Review of Systems: Constitutional:  Weight is down Eyes: No changes in vision. ENT: No oral lesions, sore throat.  GI: see HPI.  Heme/Lymph: No easy bruising.  CV: No chest pain.  GU: No hematuria.  Integumentary: No rashes. + jaundice Neuro: No headaches.  Psych: No depression/anxiety.  Endocrine: No heat/cold intolerance.  Allergic/Immunologic: No urticaria.  Resp: No cough, SOB.  Musculoskeletal: No joint swelling.    Physical Examination: BP 167/74 mmHg  Pulse 69  Temp(Src) 94.8 F (34.9  C) (Rectal)  Resp 20  Ht 5\' 11"  (1.803 m)  Wt 91.264 kg (201 lb 3.2 oz)  BMI 28.07 kg/m2  SpO2 97% Gen: NAD, alert and oriented x 4, + jaundice HEENT: EOMI, + scleral icterus Neck: supple, no JVD or thyromegaly Chest: CTA bilaterally, no wheezes, crackles, or other adventitious sounds CV: RRR, no m/g/c/r Abd: soft, +distended, +BS in all four quadrants; no HSM, guarding, ridigity, or rebound tenderness Ext: no edema, well perfused with 2+ pulses, Skin: no rash or lesions noted, + jaundice Lymph: no LAD  Data: Lab Results  Component Value Date   WBC 7.5 02/11/2015   HGB 10.5* 02/11/2015   HCT 32.1* 02/11/2015   MCV 83.5 02/11/2015   PLT 268 02/11/2015    Recent Labs Lab 02/10/15 1717 02/11/15 0422  HGB 10.7* 10.5*   Lab Results  Component Value Date   NA 135 02/11/2015   K 2.8* 02/11/2015   CL 105 02/11/2015   CO2 21* 02/11/2015   BUN 24* 02/11/2015   CREATININE 0.92 02/11/2015   Lab Results  Component Value Date   ALT 329* 02/11/2015   AST 309* 02/11/2015   ALKPHOS 389* 02/11/2015   BILITOT 11.6* 02/11/2015    Recent Labs Lab 02/10/15 1717  INR 1.11   Assessment/Plan: Mr. Mciver is a 74 y.o. male with painless jaundice, laboratory studies suggestive of obstructive jaundice, and a CT scan showing low density lesion of pancreatic body and tail with differential being pancreatitis versus pancreatic mass.  CBD midly dilated at 12 mm.   Recommendations: - will plan for ERCP for potentially  diagnostic and therapeutic purposes, hopefully on Monday if Dr. Candace Cruise or Allen Norris are available - Advance diet as tolerated - Monitor daily liver enzymes, watch for signs and symptoms of cholangitis - Further recs pending above  Thank you for the consult. Please call with questions or concerns.  Lakhia Gengler, Grace Blight, MD

## 2015-02-11 NOTE — Consult Note (Signed)
[facility] CONSULT NOTE  Patient Care Team: Rusty Aus, MD as PCP - General (Unknown Physician Specialty)  CHIEF COMPLAINTS/PURPOSE OF CONSULTATION:  Painless jaundice  HISTORY OF PRESENTING ILLNESS:  [name] 74 y.o.  male the prior history of CAD who is fairly active was noted to have decline in his overall health over the last 2 weeks.    He has been having poor appetite/fatigue; lost about 10 pounds in the last 1 month. He denies any significant epigastric or upper quadrant abdominal pain. No nausea no vomiting. Family noticed have loose stools for the last 1 week or so. Denies any skin itching. Over the last few days he also had mental status changes that he was more confused.  On follow up with PCP- labs were done that showed significantly elevated bilirubin up to 13; with the obstructive pattern of LFTs. Patient subsequently had a CT scan of the abdomen and pelvis noncontrast- that showed possible mass versus pancreatitis.  Since being hospital on IV fluids; patient's mental status changes has improved. As per family he is close to his baseline mentation.  ROS:  chronic chest pains. No shortness of breath or cough. A complete 10 point review of system is done which is negative except mentioned above in history of present illness  MEDICAL HISTORY:  Past Medical History  Diagnosis Date  . Hyperlipidemia   . H/O cardiac catheterization 06/03/06,12/03/11  . BPH (benign prostatic hypertrophy)   . Anxiety   . Syncope     LAUGHING INDUCED  . Myocardial infarction ([place])     x 2  . CHF (congestive heart failure) ([place])   . CAD (coronary artery disease) 06/03/06    cypher 2.5 x 92mm DES for 80% mid RCA sees Dr. Ubaldo Glassing  . Asthma     as a child  . Sleep apnea     hx of  . Kidney stone     hx of  . GERD (gastroesophageal reflux disease)     on medication for  . 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 Arthritis   . Anemia     taking iron   . Cataract, bilateral   . Neuromuscular disorder ([place])     diabetic neuropathy  . Diabetes mellitus without complication ([place])     SURGICAL HISTORY: Past Surgical History  Procedure Laterality Date  . Spine surgery  9/05    CERVICAL LAMINECTOMY  . Cervical herniated    . Eye surgery      as a child  . Coronary artery bypass graft  12/12/2011    Procedure: CORONARY ARTERY BYPASS GRAFTING (CABG);  Surgeon: Gaye Pollack, MD;  Location: [facility];  Service: Open Heart Surgery;  Laterality: N/A;  coronary artery bypass graft times four using left internal mammary artery, right leg greater saphenous vein harvested endoscopically  . Cardiac catheterization N/A 10/26/2014    Procedure: Left Heart Cath;  Surgeon: Isaias Cowman, MD;  Location: [facility] [facility];  Service: Cardiovascular;  Laterality: N/A;    SOCIAL HISTORY: Social History   Social History  . Marital Status: Married    Spouse Name: N/A  . Number of Children: N/A  . Years of Education: N/A   Occupational History  . Not on file.   Social History Main Topics  . Smoking status: Former Smoker    Types: Cigarettes, Cigars    Quit date: 12/03/1977  . Smokeless tobacco:  Never Used  . Alcohol Use: No  . Drug Use: No  . Sexual Activity: Not on file   Other Topics Concern  . Not on file   Social History Narrative   Lives with his wife    FAMILY HISTORY: Family History  Problem Relation Age of Onset  . Diabetes Mellitus II Mother     Also Brother and sister  . Leukemia Father     ALLERGIES:  is allergic to zithromax and lisinopril.  MEDICATIONS:  Current Facility-Administered Medications  Medication Dose Route Frequency Provider Last Rate Last Dose  . aspirin EC tablet 81 mg  81 mg Oral Daily Lytle Butte, MD   81 mg at 02/11/15 4332  . dextrose 5 %-0.9 % sodium chloride infusion   Intravenous Continuous Lytle Butte, MD 100 mL/hr at 02/10/15 2145    . gabapentin (NEURONTIN) capsule 300 mg  300 mg Oral  BID Lytle Butte, MD   300 mg at 02/11/15 0900  . heparin injection 5,000 Units  5,000 Units Subcutaneous 3 times per day Lytle Butte, MD   5,000 Units at 02/11/15 0515  . hydrALAZINE (APRESOLINE) injection 10 mg  10 mg Intravenous Q4H PRN Lytle Butte, MD      . insulin aspart (novoLOG) injection 0-5 Units  0-5 Units Subcutaneous QHS Lytle Butte, MD   0 Units at 02/10/15 2245  . insulin aspart (novoLOG) injection 0-9 Units  0-9 Units Subcutaneous TID WC Lytle Butte, MD   2 Units at 02/11/15 0901  . insulin glargine (LANTUS) injection 20 Units  20 Units Subcutaneous BID Lytle Butte, MD   20 Units at 02/11/15 [phone]  . meloxicam (MOBIC) tablet 7.5 mg  7.5 mg Oral Daily Lytle Butte, MD   7.5 mg at 02/11/15 8416  . morphine 2 MG/ML injection 2 mg  2 mg Intravenous Q4H PRN Lytle Butte, MD      . ondansetron [facility]) tablet 4 mg  4 mg Oral Q6H PRN Lytle Butte, MD       Or  . ondansetron [facility]) injection 4 mg  4 mg Intravenous Q6H PRN Lytle Butte, MD      . oxyCODONE (Oxy IR/ROXICODONE) immediate release tablet 5 mg  5 mg Oral Q4H PRN Lytle Butte, MD      . pantoprazole (PROTONIX) EC tablet 40 mg  40 mg Oral Daily Lytle Butte, MD   40 mg at 02/11/15 6063      .  PHYSICAL EXAMINATION:  Filed Vitals:   02/10/15 2306  BP:   Pulse:   Temp: 94.8 F (34.9 C)  Resp:    Filed Weights   02/10/15 1706 02/10/15 2236  Weight: 198 lb (89.812 kg) 201 lb 3.2 oz (91.264 kg)    GENERAL: Well-nourished well-developed; Alert, no distress; comfortable. He is accompanied by his daughter/wife and son-in-law. EYES: Positive for icterus  OROPHARYNX: no thrush or ulceration; good dentition  NECK: supple, no masses felt LYMPH:  no palpable lymphadenopathy in the cervical, axillary or inguinal regions LUNGS: clear to auscultation and  No wheeze or crackles HEART/CVS: regular rate & rhythm and no murmurs; No lower extremity edema ABDOMEN: abdomen soft, non-tender and normal bowel  sounds Musculoskeletal:no cyanosis of digits and no clubbing  PSYCH: alert & oriented x 3 with fluent speech NEURO: no focal motor/sensory deficits SKIN:  no rashes or significant lesions  LABORATORY DATA:  I have reviewed the data as listed Lab  Results  Component Value Date   WBC 7.5 02/11/2015   HGB 10.5* 02/11/2015   HCT 32.1* 02/11/2015   MCV 83.5 02/11/2015   PLT 268 02/11/2015    Recent Labs  01/22/15 1000 02/10/15 1717 02/11/15 0422  NA 136 135 135  K 3.9 3.8 2.8*  CL 104 107 105  CO2 27 21* 21*  GLUCOSE 274* 54* 150*  BUN 37* 30* 24*  CREATININE 2.19* 0.97 0.92  CALCIUM 8.7* 8.6* 8.6*  GFRNONAA 28* >60 >60  GFRAA 32* >60 >60  PROT  --  7.2 6.6  ALBUMIN  --  3.0* 2.7*  AST  --  388* 309*  ALT  --  394* 329*  ALKPHOS  --  406* 389*  BILITOT  --  12.9* 11.6*    RADIOGRAPHIC STUDIES: I have personally reviewed the radiological images as listed and agreed with the findings in the report. Ct Abdomen Wo Contrast  02/10/2015  CLINICAL DATA:  Elevated LFTs and bilirubin, jaundice EXAM: CT ABDOMEN WITHOUT CONTRAST TECHNIQUE: Multidetector CT imaging of the abdomen was performed following the standard protocol without IV contrast. Sagittal and coronal MPR images reconstructed from axial data set. Patient drank dilute oral contrast for exam. COMPARISON:  None. FINDINGS: Minimal atelectasis dependently LEFT lower lobe. Liver, spleen, kidneys and adrenal glands unremarkable for noncontrast technique. Normal appendix. Heterogeneous region of low attenuation at the pancreatic body extending into the proximal tail could represent focal pancreatitis though a more focal area of low attenuation 21 x 17 mm is identified, cannot exclude tumor. No definite biliary dilatation, CBD 12 mm diameter. No definite biliary tract calcifications. Tiny hiatal hernia. Visualized bowel loops unremarkable. Scattered atherosclerotic calcifications. No additional mass, adenopathy free air or free fluid.  IMPRESSION: Abnormal appearance to the pancreatic body which could be related to focal pancreatitis though mass/ tumor not excluded ; MR imaging recommended to exclude pancreatic neoplasm. Mild biliary dilatation, can evaluate by MRCP sequence at time of MR imaging. Electronically Signed   By: Lavonia Dana M.D.   On: 02/10/2015 16:26   Dg Chest 2 View  01/22/2015  CLINICAL DATA:  Chest pain EXAM: CHEST  2 VIEW COMPARISON:  10/26/2014 FINDINGS: Prior CABG. Heart size mildly enlarged. Negative for heart failure. Lungs are clear. IMPRESSION: No active cardiopulmonary disease. Electronically Signed   By: Franchot Gallo M.D.   On: 01/22/2015 10:03   Ct Head Wo Contrast  02/10/2015  CLINICAL DATA:  Altered mental status EXAM: CT HEAD WITHOUT CONTRAST TECHNIQUE: Contiguous axial images were obtained from the base of the skull through the vertex without intravenous contrast. COMPARISON:  None. FINDINGS: Motion degraded images. No evidence of parenchymal hemorrhage or extra-axial fluid collection. No mass lesion, mass effect, or midline shift. No CT evidence of acute infarction. Mild cortical atrophy.  No ventriculomegaly. The visualized paranasal sinuses are essentially clear. The mastoid air cells are unopacified. No evidence of calvarial fracture. IMPRESSION: Motion degraded images. No evidence of acute intracranial abnormality. Mild cortical atrophy. Electronically Signed   By: Julian Hy M.D.   On: 02/10/2015 20:27   US Carotid Bilateral  02/10/2015  CLINICAL DATA:  Syncope and history of diabetes, coronary artery disease and prior CABG. EXAM: BILATERAL CAROTID DUPLEX ULTRASOUND TECHNIQUE: Pearline Cables scale imaging, color Doppler and duplex ultrasound were performed of bilateral carotid and vertebral arteries in the neck. COMPARISON:  None. FINDINGS: Criteria: Quantification of carotid stenosis is based on velocity parameters that correlate the residual internal carotid diameter with NASCET-based stenosis  levels,  using the diameter of the distal internal carotid lumen as the denominator for stenosis measurement. The following velocity measurements were obtained: RIGHT ICA:  73/12 cm/sec CCA:  92/4 cm/sec SYSTOLIC ICA/CCA RATIO:  1.1 DIASTOLIC ICA/CCA RATIO:  1.3 ECA:  164 cm/sec LEFT ICA:  105/18 cm/sec CCA:  46/28 cm/sec SYSTOLIC ICA/CCA RATIO:  1.2 DIASTOLIC ICA/CCA RATIO:  1.0 ECA:  147 cm/sec RIGHT CAROTID ARTERY: Mild amount of calcified plaque is present at the level of the distal bulb and proximal ICA. Velocities and waveforms are normal and estimated right ICA stenosis is less than 50%. RIGHT VERTEBRAL ARTERY: Antegrade flow with normal waveform and velocity. LEFT CAROTID ARTERY: There is a mild amount of calcified plaque at the level of the left carotid bulb and proximal ICA. Overall plaque volume is slightly greater on the left compared to the right. Velocities and waveforms are normal and estimated left ICA stenosis is less than 50%. LEFT VERTEBRAL ARTERY: Antegrade flow with normal waveform and velocity. IMPRESSION: Mild amount of plaque at the level of both carotid bulbs and proximal internal carotid arteries, left greater than right. No significant carotid stenosis is identified with estimated bilateral ICA stenoses of less than 50%. Electronically Signed   By: Aletta Edouard M.D.   On: 02/10/2015 17:15    ASSESSMENT & PLAN:  74 year old male patient with history of CAD currently admitted to the hospital for:  #Painless jaundice/elevated LFTs;noncontrast CT shows possible mass in the pancreas versus pancreatitis  #Weight loss  # Mental status changes-likely secondary to elevated bilirubin/elevated ammonia levels  Recommendations:   Await GI recommendations regarding further workup for painless jaundice/obstructive jaundice. I will recommend a tissue diagnosis if pancreatic mass is suspected. Check CA-19-9.   The above plan was discussed with the patient/family-wife and daughter in  detail.   Also discussed with Dr. Ginette Pitman.   All questions were answered. The patient knows to call the clinic with any problems, questions or concerns.     Cammie Sickle, MD 02/11/2015 11:10 AM

## 2015-02-12 LAB — CANCER ANTIGEN 19-9: CA 19 9: 4032 U/mL — AB (ref 0–35)

## 2015-02-12 LAB — COMPREHENSIVE METABOLIC PANEL
ALK PHOS: 381 U/L — AB (ref 38–126)
ALT: 308 U/L — AB (ref 17–63)
ANION GAP: 7 (ref 5–15)
AST: 296 U/L — ABNORMAL HIGH (ref 15–41)
Albumin: 2.5 g/dL — ABNORMAL LOW (ref 3.5–5.0)
BUN: 17 mg/dL (ref 6–20)
CALCIUM: 8.6 mg/dL — AB (ref 8.9–10.3)
CO2: 24 mmol/L (ref 22–32)
CREATININE: 0.75 mg/dL (ref 0.61–1.24)
Chloride: 107 mmol/L (ref 101–111)
Glucose, Bld: 160 mg/dL — ABNORMAL HIGH (ref 65–99)
Potassium: 3.5 mmol/L (ref 3.5–5.1)
Sodium: 138 mmol/L (ref 135–145)
TOTAL PROTEIN: 6.4 g/dL — AB (ref 6.5–8.1)
Total Bilirubin: 13.1 mg/dL — ABNORMAL HIGH (ref 0.3–1.2)

## 2015-02-12 LAB — GLUCOSE, CAPILLARY
GLUCOSE-CAPILLARY: 170 mg/dL — AB (ref 65–99)
GLUCOSE-CAPILLARY: 246 mg/dL — AB (ref 65–99)
GLUCOSE-CAPILLARY: 267 mg/dL — AB (ref 65–99)
GLUCOSE-CAPILLARY: 274 mg/dL — AB (ref 65–99)
Glucose-Capillary: 233 mg/dL — ABNORMAL HIGH (ref 65–99)

## 2015-02-12 LAB — CBC WITH DIFFERENTIAL/PLATELET
BLASTS: 0 %
Band Neutrophils: 0 %
Basophils Absolute: 0 10*3/uL (ref 0.0–0.1)
Basophils Relative: 0 %
EOS PCT: 1 %
Eosinophils Absolute: 0.1 10*3/uL (ref 0.0–0.7)
HCT: 31.9 % — ABNORMAL LOW (ref 40.0–52.0)
HEMOGLOBIN: 10.3 g/dL — AB (ref 13.0–18.0)
LYMPHS ABS: 1.6 10*3/uL (ref 0.7–4.0)
Lymphocytes Relative: 22 %
MCH: 27.2 pg (ref 26.0–34.0)
MCHC: 32.4 g/dL (ref 32.0–36.0)
MCV: 84 fL (ref 80.0–100.0)
METAMYELOCYTES PCT: 0 %
MONOS PCT: 11 %
MYELOCYTES: 0 %
Monocytes Absolute: 0.8 10*3/uL (ref 0.1–1.0)
NEUTROS PCT: 66 %
NRBC: 0 /100{WBCs}
Neutro Abs: 4.8 10*3/uL (ref 1.7–7.7)
Platelets: 254 10*3/uL (ref 150–440)
Promyelocytes Absolute: 0 %
RBC: 3.79 MIL/uL — AB (ref 4.40–5.90)
RDW: 17 % — ABNORMAL HIGH (ref 11.5–14.5)
WBC: 7.3 10*3/uL (ref 3.8–10.6)

## 2015-02-12 MED ORDER — AMLODIPINE BESYLATE 5 MG PO TABS
5.0000 mg | ORAL_TABLET | Freq: Every day | ORAL | Status: DC
  Administered 2015-02-12 – 2015-02-14 (×2): 5 mg via ORAL
  Filled 2015-02-12 (×3): qty 1

## 2015-02-12 MED ORDER — LOSARTAN POTASSIUM 50 MG PO TABS
50.0000 mg | ORAL_TABLET | Freq: Every day | ORAL | Status: DC
Start: 2015-02-12 — End: 2015-02-12
  Filled 2015-02-12: qty 1

## 2015-02-12 NOTE — Progress Notes (Signed)
Spoke w/Dr. Ginette Pitman to discuss pt refusing to take Cozaar 50 mg tablet.  Pt states that he does not want to take this medication without discussing it further with his MD.  Pt states that he has been prescribed this medication in the past and it drops his blood pressure too low.  Orders received to d/c Cozaar and start pt on Amlodipine 5 mg daily.  Will continue to monitor and notify MD of any changes.

## 2015-02-12 NOTE — Progress Notes (Signed)
PROGRESS NOTE  CADE DASHNER EZM:[id number] DOB: [birth year] DOA: 02/10/2015 PCP: Rusty Aus., MD  Subjective 74 y/o male with hx of Type 2 DM, CAD admitted with Painless jaundice  weakness, weight loss,lethargy and confusion. CT abdomen showed abnormal appearance of Pancreatic body ? Pancreatitis vs tumor This am alert- Denies complaints  Consultants:  GI - Dr. Rayann Heman  Oncology- Dr. Rogue Bussing   Objective: BP 178/68 mmHg  Pulse 74  Temp(Src) 98.2 F (36.8 C) (Oral)  Resp 20  Ht 5\' 11"  (1.803 m)  Wt 91.264 kg (201 lb 3.2 oz)  BMI 28.07 kg/m2  SpO2 97%  Intake/Output Summary (Last 24 hours) at 02/12/15 1056 Last data filed at 02/12/15 0931  Gross per 24 hour  Intake   4418 ml  Output    300 ml  Net   4118 ml   Filed Weights   02/10/15 1706 02/10/15 2236  Weight: 89.812 kg (198 lb) 91.264 kg (201 lb 3.2 oz)    Exam:  General: Well developed, well nourished male, in NAD HEENT: PERRL; OP moist without lesions. Neck:  trachea midline, no thyromegaly Chest: normal to palpation Lungs: clear bilaterally without retractions or wheezes Cardiovascular: RRR, no murmur, no gallop Abdomen: soft, nontender, nondistended, positive bowel sounds Extremities: no clubbing, cyanosis, edema Neuro: alert and oriented, moves all extremities Derm: Jaundiced skin noted. Few telengectasia noted Lymph: no cervical or supraclavicular lymphadenopathy   Labs and imaging studies were reviewed*  Data Reviewed: Basic Metabolic Panel:  Recent Labs Lab 02/10/15 1717 02/11/15 0422 02/12/15 0552  NA 135 135 138  K 3.8 2.8* 3.5  CL 107 105 107  CO2 21* 21* 24  GLUCOSE 54* 150* 160*  BUN 30* 24* 17  CREATININE 0.97 0.92 0.75  CALCIUM 8.6* 8.6* 8.6*   Liver Function Tests:  Recent Labs Lab 02/10/15 1717 02/11/15 0422 02/12/15 0552  AST 388* 309* 296*  ALT 394* 329* 308*  ALKPHOS 406* 389* 381*  BILITOT 12.9* 11.6* 13.1*  PROT 7.2 6.6 6.4*  ALBUMIN 3.0* 2.7* 2.5*     Recent Labs Lab 02/10/15 1717  LIPASE 121*    Recent Labs Lab 02/10/15 2154  AMMONIA 45*   CBC:  Recent Labs Lab 02/10/15 1717 02/11/15 0422 02/12/15 0552  WBC 8.3 7.5 7.3  NEUTROABS  --   --  4.8  HGB 10.7* 10.5* 10.3*  HCT 33.2* 32.1* 31.9*  MCV 83.9 83.5 84.0  PLT 301 268 254   Cardiac Enzymes:   No results for input(s): CKTOTAL, CKMB, CKMBINDEX, TROPONINI in the last 168 hours. BNP (last 3 results)  Recent Labs  10/25/14 2326  BNP 53.0    ProBNP (last 3 results) No results for input(s): PROBNP in the last 8760 hours.  CBG:  Recent Labs Lab 02/11/15 0255 02/11/15 1209 02/11/15 1620 02/11/15 2102 02/12/15 0736  GLUCAP 178* 169* 182* 206* 170*    No results found for this or any previous visit (from the past 240 hour(s)).   Studies: No results found.  Scheduled Meds: . aspirin EC  81 mg Oral Daily  . gabapentin  300 mg Oral BID  . heparin  5,000 Units Subcutaneous 3 times per day  . insulin aspart  0-5 Units Subcutaneous QHS  . insulin aspart  0-9 Units Subcutaneous TID WC  . insulin glargine  20 Units Subcutaneous BID  . meloxicam  7.5 mg Oral Daily  . pantoprazole  40 mg Oral Daily    Continuous Infusions: . dextrose 5 % and 0.9%  NaCl 100 mL/hr at 02/12/15 6060    Assessment/Plan: 1 Painless Obstructive jaundice concerning for pancreatic malignancy Elevated liver enzymes and biliubin- slight improvement noted MRCP not possible because of cervical hardware/Laminectomy For ERCP- Possibly in am  Appreciate  GI and Oncology consult 2 Altered mental status with weakness; Feels better  Se Ammonia ;45-No obvious focus of infection 3 Type 2 DM; On SSI 4 CAD: Stable  5 HTN: Start Losartan 50 mg po qd  6 Hypokalemia- Improved    Code Status: Full  Communication; Wife        02/12/2015, 10:56 AM  LOS: 2 days

## 2015-02-12 NOTE — Progress Notes (Signed)
Spoke with Dr. Rayann Heman regarding pt clay colored stools and amber colored urine as his note stated to monitor for cholangitis.  Pt is afebrile and has no c/o RUQ pain.  No chills and BP is stable.    No new orders.    Will continue to monitor.

## 2015-02-12 NOTE — Progress Notes (Signed)
GI Inpatient Follow-up Note  Patient Identification: [name] is a 74 y.o. male with obstructive jaundice, possible panc mass  Subjective:  Little change today.  Slightly more somnolent but got oxycodone.  Denies abd pain, n/v, rectal bleeding, melena, f/c.    Scheduled Inpatient Medications:  . aspirin EC  81 mg Oral Daily  . gabapentin  300 mg Oral BID  . heparin  5,000 Units Subcutaneous 3 times per day  . insulin aspart  0-5 Units Subcutaneous QHS  . insulin aspart  0-9 Units Subcutaneous TID WC  . insulin glargine  20 Units Subcutaneous BID  . losartan  50 mg Oral Daily  . meloxicam  7.5 mg Oral Daily  . pantoprazole  40 mg Oral Daily    Continuous Inpatient Infusions:   . dextrose 5 % and 0.9% NaCl 100 mL/hr at 02/12/15 0529    PRN Inpatient Medications:  hydrALAZINE, morphine injection, ondansetron **OR** ondansetron (ZOFRAN) IV, oxyCODONE   Physical Examination: BP 178/68 mmHg  Pulse 74  Temp(Src) 98.2 F (36.8 C) (Oral)  Resp 20  Ht 5\' 11"  (1.803 m)  Wt 91.264 kg (201 lb 3.2 oz)  BMI 28.07 kg/m2  SpO2 97% Gen: NAD, alert and oriented x 2 Neck: supple, no JVD or thyromegaly Chest: CTA bilaterally, no wheezes, crackles, or other adventitious sounds CV: RRR, no m/g/c/r Abd: +distended, NT, +BS in all four quadrants; no HSM, guarding, ridigity, or rebound tenderness Ext: no edema, well perfused with 2+ pulses, Skin: no rash or lesions noted, + jaundice Lymph: no LAD  Data: Lab Results  Component Value Date   WBC 7.3 02/12/2015   HGB 10.3* 02/12/2015   HCT 31.9* 02/12/2015   MCV 84.0 02/12/2015   PLT 254 02/12/2015    Recent Labs Lab 02/10/15 1717 02/11/15 0422 02/12/15 0552  HGB 10.7* 10.5* 10.3*   Lab Results  Component Value Date   NA 138 02/12/2015   K 3.5 02/12/2015   CL 107 02/12/2015   CO2 24 02/12/2015   BUN 17 02/12/2015   CREATININE 0.75 02/12/2015   Lab Results  Component Value Date   ALT 308* 02/12/2015   AST 296*  02/12/2015   ALKPHOS 381* 02/12/2015   BILITOT 13.1* 02/12/2015    Recent Labs Lab 02/10/15 1717  INR 1.11   Assessment/Plan:  Mr. Borton is a 74 y.o. male with obstructive jaundice, possible panc mass on CT.   Recommendations: - ERCP tomorrow with Dr Allen Norris - NPO after mn.  - no am heparin or lovenox - watch for s/s cholangitis.   Please call with questions or concerns.  REIN, Grace Blight, MD

## 2015-02-13 ENCOUNTER — Encounter
Admission: EM | Disposition: A | Discharge status: Home / Self Care | Payer: Self-pay | Source: Home / Self Care | Attending: Internal Medicine

## 2015-02-13 ENCOUNTER — Encounter: Payer: Self-pay | Admitting: *Deleted

## 2015-02-13 DIAGNOSIS — K839 Disease of biliary tract, unspecified: Secondary | ICD-10-CM

## 2015-02-13 DIAGNOSIS — K838 Other specified diseases of biliary tract: Secondary | ICD-10-CM

## 2015-02-13 DIAGNOSIS — K831 Obstruction of bile duct: Secondary | ICD-10-CM | POA: Insufficient documentation

## 2015-02-13 DIAGNOSIS — R17 Unspecified jaundice: Secondary | ICD-10-CM

## 2015-02-13 DIAGNOSIS — D490 Neoplasm of unspecified behavior of digestive system: Secondary | ICD-10-CM

## 2015-02-13 HISTORY — PX: ENDOSCOPIC RETROGRADE CHOLANGIOPANCREATOGRAPHY (ERCP) WITH PROPOFOL: SHX5810

## 2015-02-13 LAB — COMPREHENSIVE METABOLIC PANEL
ALT: 270 U/L — AB (ref 17–63)
AST: 235 U/L — AB (ref 15–41)
Albumin: 2.3 g/dL — ABNORMAL LOW (ref 3.5–5.0)
Alkaline Phosphatase: 393 U/L — ABNORMAL HIGH (ref 38–126)
Anion gap: 6 (ref 5–15)
BILIRUBIN TOTAL: 13.6 mg/dL — AB (ref 0.3–1.2)
BUN: 16 mg/dL (ref 6–20)
CHLORIDE: 107 mmol/L (ref 101–111)
CO2: 24 mmol/L (ref 22–32)
CREATININE: 0.9 mg/dL (ref 0.61–1.24)
Calcium: 8.5 mg/dL — ABNORMAL LOW (ref 8.9–10.3)
GFR calc Af Amer: >60 mL/min (ref >60)
Glucose, Bld: 258 mg/dL — ABNORMAL HIGH (ref 65–99)
Potassium: 3.3 mmol/L — ABNORMAL LOW (ref 3.5–5.1)
SODIUM: 137 mmol/L (ref 135–145)
TOTAL PROTEIN: 6.2 g/dL — AB (ref 6.5–8.1)

## 2015-02-13 LAB — CBC WITH DIFFERENTIAL/PLATELET
BAND NEUTROPHILS: 0 %
BASOS ABS: 0 10*3/uL (ref 0–0.1)
Basophils Relative: 0 %
Blasts: 0 %
EOS ABS: 0.1 10*3/uL (ref 0–0.7)
EOS PCT: 1 %
HCT: 30.8 % — ABNORMAL LOW (ref 40.0–52.0)
Hemoglobin: 9.9 g/dL — ABNORMAL LOW (ref 13.0–18.0)
LYMPHS ABS: 2.1 10*3/uL (ref 1.0–3.6)
LYMPHS PCT: 30 %
MCH: 26.9 pg (ref 26.0–34.0)
MCHC: 32.1 g/dL (ref 32.0–36.0)
MCV: 83.9 fL (ref 80.0–100.0)
METAMYELOCYTES PCT: 0 %
MONOS PCT: 6 %
Monocytes Absolute: 0.4 10*3/uL (ref 0.2–1.0)
Myelocytes: 0 %
NEUTROS ABS: 4.3 10*3/uL (ref 1.4–6.5)
Neutrophils Relative %: 63 %
OTHER: 0 %
Platelets: 242 10*3/uL (ref 150–440)
Promyelocytes Absolute: 0 %
RBC: 3.67 MIL/uL — ABNORMAL LOW (ref 4.40–5.90)
RDW: 17.2 % — AB (ref 11.5–14.5)
WBC: 6.9 10*3/uL (ref 3.8–10.6)
nRBC: 0 /100 WBC

## 2015-02-13 LAB — GLUCOSE, CAPILLARY
GLUCOSE-CAPILLARY: 271 mg/dL — AB (ref 65–99)
GLUCOSE-CAPILLARY: 166 mg/dL — AB (ref 65–99)
GLUCOSE-CAPILLARY: 142 mg/dL — AB (ref 65–99)
Glucose-Capillary: 217 mg/dL — ABNORMAL HIGH (ref 65–99)

## 2015-02-13 SURGERY — ENDOSCOPIC RETROGRADE CHOLANGIOPANCREATOGRAPHY (ERCP) WITH PROPOFOL
Anesthesia: General

## 2015-02-13 MED ORDER — LACTATED RINGERS IV SOLN
Freq: Once | INTRAVENOUS | Status: AC
  Administered 2015-02-13: 1000 mL via INTRAVENOUS

## 2015-02-13 MED ORDER — FENTANYL CITRATE (PF) 100 MCG/2ML IJ SOLN
INTRAMUSCULAR | Status: DC | PRN
  Administered 2015-02-13 (×2): 25 ug via INTRAVENOUS
  Administered 2015-02-13: 75 ug via INTRAVENOUS

## 2015-02-13 MED ORDER — POTASSIUM CHLORIDE CRYS ER 10 MEQ PO TBCR
10.0000 meq | EXTENDED_RELEASE_TABLET | Freq: Two times a day (BID) | ORAL | Status: DC
  Administered 2015-02-13 – 2015-02-15 (×4): 10 meq via ORAL
  Filled 2015-02-13 (×4): qty 1

## 2015-02-13 MED ORDER — SODIUM CHLORIDE 0.9 % IV SOLN: INTRAVENOUS | Status: DC

## 2015-02-13 MED ORDER — FENTANYL CITRATE (PF) 100 MCG/2ML IJ SOLN
INTRAMUSCULAR | Status: AC
  Filled 2015-02-13: qty 6

## 2015-02-13 MED ORDER — HEPARIN SODIUM (PORCINE) 5000 UNIT/ML IJ SOLN
5000.0000 [IU] | Freq: Two times a day (BID) | INTRAMUSCULAR | Status: DC
  Administered 2015-02-13 – 2015-02-15 (×4): 5000 [IU] via SUBCUTANEOUS
  Filled 2015-02-13 (×4): qty 1

## 2015-02-13 MED ORDER — MIDAZOLAM HCL 5 MG/5ML IJ SOLN
INTRAMUSCULAR | Status: AC
  Filled 2015-02-13: qty 20

## 2015-02-13 MED ORDER — MIDAZOLAM HCL 5 MG/5ML IJ SOLN
INTRAMUSCULAR | Status: DC | PRN
  Administered 2015-02-13 (×3): 1 mg via INTRAVENOUS
  Administered 2015-02-13: 3 mg via INTRAVENOUS

## 2015-02-13 MED ORDER — SODIUM CHLORIDE 0.45 % IV SOLN
INTRAVENOUS | Status: DC
  Administered 2015-02-13 – 2015-02-15 (×3): via INTRAVENOUS

## 2015-02-13 MED ORDER — INDOMETHACIN 50 MG RE SUPP
100.0000 mg | Freq: Once | RECTAL | Status: AC
  Administered 2015-02-13: 100 mg via RECTAL
  Filled 2015-02-13: qty 2

## 2015-02-13 MED ORDER — CARVEDILOL 12.5 MG PO TABS
12.5000 mg | ORAL_TABLET | Freq: Two times a day (BID) | ORAL | Status: DC
  Administered 2015-02-14 – 2015-02-15 (×3): 12.5 mg via ORAL
  Filled 2015-02-13 (×3): qty 1

## 2015-02-13 MED ORDER — SODIUM CHLORIDE 0.9 % IV SOLN
INTRAVENOUS | Status: DC
  Administered 2015-02-13: 17:00:00 via INTRAVENOUS

## 2015-02-13 NOTE — Progress Notes (Signed)
[name] is a 74 y.o. male   SUBJECTIVE:  Patient admitted with painless jaundice, bilirubin 11. No chest discomfort. Patient has been feeling poorly for some time. Recent carotid Doppler negative.  ______________________________________________________________________  ROS: Review of systems is unremarkable for any active cardiac,respiratory, GI, GU, hematologic, neurologic or psychiatric systems, 10 systems reviewed.  Marland Kitchen amLODipine  5 mg Oral Daily  . carvedilol  12.5 mg Oral BID WC  . gabapentin  300 mg Oral BID  . heparin  5,000 Units Subcutaneous BID  . insulin aspart  0-5 Units Subcutaneous QHS  . insulin aspart  0-9 Units Subcutaneous TID WC  . insulin glargine  20 Units Subcutaneous BID  . meloxicam  7.5 mg Oral Daily  . pantoprazole  40 mg Oral Daily   hydrALAZINE, morphine injection, ondansetron **OR** ondansetron (ZOFRAN) IV, oxyCODONE   Past Medical History  Diagnosis Date  . Hyperlipidemia   . H/O cardiac catheterization 06/03/06,12/03/11  . BPH (benign prostatic hypertrophy)   . Anxiety   . Syncope     LAUGHING INDUCED  . Myocardial infarction ([place])     x 2  . CHF (congestive heart failure) ([place])   . CAD (coronary artery disease) 06/03/06    cypher 2.5 x 42mm DES for 80% mid RCA sees Dr. Ubaldo Glassing  . Asthma     as a child  . Sleep apnea     hx of  . Kidney stone     hx of  . GERD (gastroesophageal reflux disease)     on medication for  . 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 Arthritis   . Anemia     taking iron  . Cataract, bilateral   . Neuromuscular disorder ([place])     diabetic neuropathy  . Diabetes mellitus without complication [facility])     Past Surgical History  Procedure Laterality Date  . Spine surgery  9/05    CERVICAL LAMINECTOMY  . Cervical herniated    . Eye surgery      as a child  . Coronary artery bypass graft  12/12/2011    Procedure: CORONARY ARTERY BYPASS GRAFTING (CABG);  Surgeon: Gaye Pollack, MD;   Location: [place];  Service: Open Heart Surgery;  Laterality: N/A;  coronary artery bypass graft times four using left internal mammary artery, right leg greater saphenous vein harvested endoscopically  . Cardiac catheterization N/A 10/26/2014    Procedure: Left Heart Cath;  Surgeon: Isaias Cowman, MD;  Location: [place] [facility];  Service: Cardiovascular;  Laterality: N/A;    PHYSICAL EXAM:  BP 186/72 mmHg  Pulse 75  Temp(Src) 98.2 F (36.8 C) (Oral)  Resp 14  Ht 5\' 11"  (1.803 m)  Wt 91.264 kg (201 lb 3.2 oz)  BMI 28.07 kg/m2  SpO2 97%  Wt Readings from Last 3 Encounters:  02/10/15 91.264 kg (201 lb 3.2 oz)  01/22/15 92.08 kg (203 lb)  10/27/14 94.031 kg (207 lb 4.8 oz)           BP Readings from Last 3 Encounters:  02/13/15 186/72  02/12/15 161/65  01/22/15 169/75    Constitutional: NAD Neck: supple, no thyromegaly Respiratory: CTA, no rales or wheezes Cardiovascular: RRR, no murmur, no gallop Abdomen: soft, good BS, nontender Extremities: no edema Neuro: alert and oriented, no focal motor or sensory deficits  ASSESSMENT/PLAN:  Labs and imaging studies were reviewed  Painless jaundice-ERCP today, appears to have a pancreatic mass, imaging  limited by hardware, GI following CAD-no sign for ischemia, recent carotid Doppler showed less than 50% stenosis, holding aspirin for now Diabetes mellitus, insulin requiring-on half dose currently with sliding scale 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

## 2015-02-13 NOTE — Progress Notes (Signed)
Inpatient Diabetes Program Recommendations  AACE/ADA: New Consensus Statement on Inpatient Glycemic Control (2015)  Target Ranges:  Prepandial:   less than 140 mg/dL      Peak postprandial:   less than 180 mg/dL (1-2 hours)      Critically ill patients:  140 - 180 mg/dL  Results for [name], [name] (MRN [id number]) as of 02/13/2015 10:37  Ref. Range 02/12/2015 07:36 02/12/2015 11:24 02/12/2015 16:19 02/12/2015 20:39 02/12/2015 22:22 02/13/2015 08:01  Glucose-Capillary Latest Ref Range: 65-99 mg/dL 170 (H) 246 (H) 233 (H) 267 (H) 274 (H) 271 (H)   Review of Glycemic Control  Diabetes history: DM2 Outpatient Diabetes medications: Lantus 40 units BID, Novolog 15-20 units TID with meals Current orders for Inpatient glycemic control: Lantus 20 units BID, Novolog 0-9 units TID with meals, Novolog 0-5 units HS  Inpatient Diabetes Program Recommendations: Insulin - Basal: Please consider increasing Lantus to 23 units BID. Correction (SSI): Please consider increasing Novolog correction to moderate scale.  Thanks, Barnie Alderman, RN, MSN, CCRN, CDE Diabetes Coordinator Inpatient Diabetes Program [phone] (Team Pager from [place] to [place]) [phone] ([place] office) [phone] [facility] office) [phone] [facility] office)

## 2015-02-13 NOTE — Care Management Important Message (Signed)
Important Message  Patient Details  Name: PARK BECK MRN: [id number] Date of Birth: 1940-07-25   Medicare Important Message Given:  Yes-second notification given    Juliann Pulse A Allmond 02/13/2015, 10:01 AM

## 2015-02-13 NOTE — Progress Notes (Signed)
Patient family requested to speak with physician. Paged Dr. Allen Norris who did ERCP.  Dr. Allen Norris agreed to speaking with patient family via phone and updated them on the ERCP procedure which took place 02/13/2015 and its relation to patient condition. Family concerns were addressed. Patient resting in bed. Continue to assess.

## 2015-02-13 NOTE — Op Note (Signed)
[facility] Gastroenterology Patient Name: [name] Procedure Date: 02/13/2015 5:18 PM MRN: [id number] Account #: [id number] Date of Birth: 1940-10-14 Admit Type: Inpatient Age: 74 Room: [facility] ENDO ROOM 4 Gender: Male Note Status: Finalized Procedure:         ERCP Indications:       Jaundice, Tumor of the body of pancreas Providers:         Lucilla Lame, MD Referring MD:      Rusty Aus, MD (Referring MD) Medicines:         Fentanyl 125 micrograms IV, Midazolam 6 mg IV Complications:     No immediate complications. Procedure:         Pre-Anesthesia Assessment:                    - Prior to the procedure, a History and Physical was                     performed, and patient medications and allergies were                     reviewed. The patient's tolerance of previous anesthesia                     was also reviewed. The risks and benefits of the procedure                     and the sedation options and risks were discussed with the                     patient. All questions were answered, and informed consent                     was obtained. Prior Anticoagulants: The patient has taken                     no previous anticoagulant or antiplatelet agents. ASA                     Grade Assessment: III - A patient with severe systemic                     disease. After reviewing the risks and benefits, the                     patient was deemed in satisfactory condition to undergo                     the procedure.                    After obtaining informed consent, the scope was passed                     under direct vision. Throughout the procedure, the                     patient's blood pressure, pulse, and oxygen saturations                     were monitored continuously. The ERCP was introduced                     through the mouth, and used to inject contrast into and  used to inject contrast into the bile duct. The ERCP was                      accomplished without difficulty. The patient tolerated the                     procedure well. Findings:      The scout film was normal. The esophagus was successfully intubated       under direct vision without detailed examination of the pharynx, larynx,       and associated structures, and upper GI tract. The upper GI tract was       grossly normal. The major papilla was bulging. The bile duct was deeply       cannulated. Contrast was injected. I personally interpreted the bile       duct images. There was brisk flow of contrast through the ducts. Image       quality was excellent. Contrast extended to the main bile duct. The       lower third of the main bile duct contained a single segmental stenosis       5 mm in length. The main bile duct was diffusely dilated. A wire was       passed into the biliary tree. The biliary sphincterotomy was extended       with a sphincterotome using ERBE electrocautery. There was no       post-sphincterotomy bleeding. Cells for cytology were obtained by       brushing. One 10 Fr by 7 cm plastic stent with a single external flap       and a single internal flap was placed 1 cm into the common bile duct.       Clear fluid flowed through the stent. The stent was in good position. Impression:        - The major papilla appeared to be bulging.                    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                    - The entire main bile duct was dilated.                    - A sphincterotomy was performed.                    - One plastic stent was placed into the common bile duct. Recommendation:    - Watch for pancreatitis, bleeding, perforation, and                     cholangitis.                    - Repeat ERCP in 3 months to exchange stent. Procedure Code(s): --- Professional ---                    [phone], Endoscopic retrograde cholangiopancreatography                     (ERCP); with placement of endoscopic stent into  biliary or                     pancreatic duct, including pre- and post-dilation and  guide wire passage, when performed, including                     sphincterotomy, when performed, each stent                    [phone], Endoscopic catheterization of the biliary ductal                     system, radiological supervision and interpretation Diagnosis Code(s): --- Professional ---                    D49.0, Neoplasm of unspecified behavior of digestive system                    R17, Unspecified jaundice                    K83.9, Disease of biliary tract, unspecified                    K83.1, Obstruction of bile duct                    K83.8, Other specified diseases of biliary tract CPT copyright 2014 American Medical Association. All rights reserved. The codes documented in this report are preliminary and upon coder review may  be revised to meet current compliance requirements. Lucilla Lame, MD 02/13/2015 5:49:29 PM This report has been signed electronically. Number of Addenda: 0 Note Initiated On: 02/13/2015 5:18 PM      [facility]

## 2015-02-14 ENCOUNTER — Encounter: Payer: Self-pay | Admitting: Radiology

## 2015-02-14 ENCOUNTER — Encounter: Payer: Self-pay | Admitting: Registered Nurse

## 2015-02-14 ENCOUNTER — Inpatient Hospital Stay: Payer: Commercial Managed Care - HMO

## 2015-02-14 DIAGNOSIS — K869 Disease of pancreas, unspecified: Secondary | ICD-10-CM

## 2015-02-14 DIAGNOSIS — R5383 Other fatigue: Secondary | ICD-10-CM

## 2015-02-14 DIAGNOSIS — M129 Arthropathy, unspecified: Secondary | ICD-10-CM

## 2015-02-14 DIAGNOSIS — I252 Old myocardial infarction: Secondary | ICD-10-CM

## 2015-02-14 DIAGNOSIS — D649 Anemia, unspecified: Secondary | ICD-10-CM

## 2015-02-14 DIAGNOSIS — K219 Gastro-esophageal reflux disease without esophagitis: Secondary | ICD-10-CM

## 2015-02-14 DIAGNOSIS — I2510 Atherosclerotic heart disease of native coronary artery without angina pectoris: Secondary | ICD-10-CM

## 2015-02-14 DIAGNOSIS — R51 Headache: Secondary | ICD-10-CM

## 2015-02-14 DIAGNOSIS — E1140 Type 2 diabetes mellitus with diabetic neuropathy, unspecified: Secondary | ICD-10-CM

## 2015-02-14 DIAGNOSIS — I509 Heart failure, unspecified: Secondary | ICD-10-CM

## 2015-02-14 DIAGNOSIS — Z79899 Other long term (current) drug therapy: Secondary | ICD-10-CM

## 2015-02-14 DIAGNOSIS — Z85828 Personal history of other malignant neoplasm of skin: Secondary | ICD-10-CM

## 2015-02-14 DIAGNOSIS — Z87442 Personal history of urinary calculi: Secondary | ICD-10-CM

## 2015-02-14 DIAGNOSIS — Z794 Long term (current) use of insulin: Secondary | ICD-10-CM

## 2015-02-14 DIAGNOSIS — N400 Enlarged prostate without lower urinary tract symptoms: Secondary | ICD-10-CM

## 2015-02-14 LAB — CBC WITH DIFFERENTIAL/PLATELET
BAND NEUTROPHILS: 0 %
BASOS PCT: 0 %
Basophils Absolute: 0 10*3/uL (ref 0–0.1)
Blasts: 0 %
EOS ABS: 0.3 10*3/uL (ref 0–0.7)
EOS PCT: 4 %
HCT: 30.4 % — ABNORMAL LOW (ref 40.0–52.0)
HEMOGLOBIN: 10 g/dL — AB (ref 13.0–18.0)
LYMPHS ABS: 1.4 10*3/uL (ref 1.0–3.6)
Lymphocytes Relative: 20 %
MCH: 27.8 pg (ref 26.0–34.0)
MCHC: 32.8 g/dL (ref 32.0–36.0)
MCV: 84.7 fL (ref 80.0–100.0)
METAMYELOCYTES PCT: 0 %
MONO ABS: 0.3 10*3/uL (ref 0.2–1.0)
MYELOCYTES: 0 %
Monocytes Relative: 4 %
Neutro Abs: 4.9 10*3/uL (ref 1.4–6.5)
Neutrophils Relative %: 72 %
Other: 0 %
PLATELETS: 250 10*3/uL (ref 150–440)
PROMYELOCYTES ABS: 0 %
RBC: 3.59 MIL/uL — ABNORMAL LOW (ref 4.40–5.90)
RDW: 17.6 % — ABNORMAL HIGH (ref 11.5–14.5)
WBC: 6.9 10*3/uL (ref 3.8–10.6)
nRBC: 0 /100 WBC

## 2015-02-14 LAB — GLUCOSE, CAPILLARY
GLUCOSE-CAPILLARY: 290 mg/dL — AB (ref 65–99)
Glucose-Capillary: 180 mg/dL — ABNORMAL HIGH (ref 65–99)
Glucose-Capillary: 276 mg/dL — ABNORMAL HIGH (ref 65–99)
Glucose-Capillary: 243 mg/dL — ABNORMAL HIGH (ref 65–99)

## 2015-02-14 LAB — COMPREHENSIVE METABOLIC PANEL
ALBUMIN: 1.9 g/dL — AB (ref 3.5–5.0)
ALK PHOS: 348 U/L — AB (ref 38–126)
ALT: 212 U/L — ABNORMAL HIGH (ref 17–63)
ANION GAP: 4 — AB (ref 5–15)
AST: 152 U/L — ABNORMAL HIGH (ref 15–41)
BILIRUBIN TOTAL: 8.9 mg/dL — AB (ref 0.3–1.2)
BUN: 15 mg/dL (ref 6–20)
CALCIUM: 8.2 mg/dL — AB (ref 8.9–10.3)
CO2: 23 mmol/L (ref 22–32)
Chloride: 111 mmol/L (ref 101–111)
Creatinine, Ser: 0.96 mg/dL (ref 0.61–1.24)
GFR calc Af Amer: >60 mL/min (ref >60)
GLUCOSE: 150 mg/dL — AB (ref 65–99)
Potassium: 3.5 mmol/L (ref 3.5–5.1)
Sodium: 138 mmol/L (ref 135–145)
TOTAL PROTEIN: 5 g/dL — AB (ref 6.5–8.1)

## 2015-02-14 LAB — MAGNESIUM: MAGNESIUM: 1.6 mg/dL — AB (ref 1.7–2.4)

## 2015-02-14 LAB — LIPASE, BLOOD: Lipase: 26 U/L (ref 11–51)

## 2015-02-14 MED ORDER — IOHEXOL 300 MG/ML  SOLN
100.0000 mL | Freq: Once | INTRAMUSCULAR | Status: AC | PRN
Start: 2015-02-14 — End: 2015-02-14
  Administered 2015-02-14: 100 mL via INTRAVENOUS

## 2015-02-14 MED ORDER — OXYCODONE HCL 5 MG PO TABS: 5.0000 mg | ORAL_TABLET | ORAL | Status: DC | PRN

## 2015-02-14 NOTE — Progress Notes (Signed)
Inpatient Diabetes Program Recommendations  AACE/ADA: New Consensus Statement on Inpatient Glycemic Control (2015)  Target Ranges:  Prepandial:   less than 140 mg/dL      Peak postprandial:   less than 180 mg/dL (1-2 hours)      Critically ill patients:  140 - 180 mg/dL  Results for [name], [name] (MRN [id number]) as of 02/14/2015 12:24  Ref. Range 02/13/2015 08:01 02/13/2015 11:30 02/13/2015 16:36 02/13/2015 21:25 02/14/2015 07:46 02/14/2015 11:24  Glucose-Capillary Latest Ref Range: 65-99 mg/dL 271 (H) 217 (H) 166 (H) 142 (H) 180 (H) 276 (H)   Review of Glycemic Control  Diabetes history: DM2 Outpatient Diabetes medications: Lantus 40 units BID, Novolog 15-20 units TID with meals Current orders for Inpatient glycemic control: Lantus 20 units BID, Novolog 0-9 units TID with meals, Novolog 0-5 units HS  Inpatient Diabetes Program Recommendations: Insulin - Meal Coverage: Please consider ordering Novolog 5 units TID with meals for meal coverage (in addition to Novolog correction scale).  Thanks, Barnie Alderman, RN, MSN, CCRN, CDE Diabetes Coordinator Inpatient Diabetes Program [phone] (Team Pager from [place] to [place]) [phone] ([facility] office) [phone] [facility] office) [phone] [facility] office)

## 2015-02-14 NOTE — Progress Notes (Signed)
GI Inpatient Follow-up Note  Patient Identification: [name] is a 74 y.o. male with biliary obstruction, possible panc mass.  ERCP with CBD stricture  Subjective:  Feels well.  No abd pain, n/v, f/c. Tolerating diet.  No bleeding or melena.   Scheduled Inpatient Medications:  . amLODipine  5 mg Oral Daily  . carvedilol  12.5 mg Oral BID WC  . gabapentin  300 mg Oral BID  . heparin  5,000 Units Subcutaneous BID  . insulin aspart  0-5 Units Subcutaneous QHS  . insulin aspart  0-9 Units Subcutaneous TID WC  . insulin glargine  20 Units Subcutaneous BID  . meloxicam  7.5 mg Oral Daily  . pantoprazole  40 mg Oral Daily  . potassium chloride  10 mEq Oral BID    Continuous Inpatient Infusions:   . sodium chloride 75 mL/hr at 02/14/15 1351    PRN Inpatient Medications:  hydrALAZINE, morphine injection, ondansetron **OR** ondansetron (ZOFRAN) IV, oxyCODONE    Physical Examination: BP 168/81 mmHg  Pulse 66  Temp(Src) 97.7 F (36.5 C) (Oral)  Resp 19  Ht 5' 11"  (1.803 m)  Wt 91.264 kg (201 lb 3.2 oz)  BMI 28.07 kg/m2  SpO2 96% Gen: NAD, alert and oriented x 4 HEENT: PEERLA, EOMI, Neck: supple, no JVD or thyromegaly Chest: CTA bilaterally, no wheezes, crackles, or other adventitious sounds CV: RRR, no m/g/c/r Abd: soft, NT, ND, +BS in all four quadrants; no HSM, guarding, ridigity, or rebound tenderness Ext: no edema, well perfused with 2+ pulses, Skin: no rash or lesions noted Lymph: no LAD  Data: Lab Results  Component Value Date   WBC 6.9 02/14/2015   HGB 10.0* 02/14/2015   HCT 30.4* 02/14/2015   MCV 84.7 02/14/2015   PLT 250 02/14/2015    Recent Labs Lab 02/12/15 0552 02/13/15 0507 02/14/15 0646  HGB 10.3* 9.9* 10.0*   Lab Results  Component Value Date   NA 137 02/13/2015   K 3.3* 02/13/2015   CL 107 02/13/2015   CO2 24 02/13/2015   BUN 16 02/13/2015   CREATININE 0.90 02/13/2015   Lab Results  Component Value Date   ALT 270* 02/13/2015    AST 235* 02/13/2015   ALKPHOS 393* 02/13/2015   BILITOT 13.6* 02/13/2015    Recent Labs Lab 02/10/15 1717  INR 1.11   Assessment/Plan: Mr. Gartman is a 74 y.o. male with biliary obstruction, possible panc mass.  ERCP with CBD stricture s/p plastic stent  Recommendations: - await brushings - needs EUS if brushing negative - stent removal or exchange in 2 monthns - met-c today and in am - likely d/c Wednesday  Please call with questions or concerns.  Izic Stfort, Grace Blight, MD

## 2015-02-14 NOTE — Progress Notes (Signed)
Patient ID: [name], male   DOB: 06-Jun-1940, 74 y.o.   MRN: [id number] [name] is a 74 y.o. male   SUBJECTIVE:  Patient admitted with painless jaundice, bilirubin 11. No chest discomfort. Patient has been feeling poorly for some time. Recent carotid Doppler negative. Less jaundiced today post ERCP, brushings pending  ______________________________________________________________________  ROS: Review of systems is unremarkable for any active cardiac,respiratory, GI, GU, hematologic, neurologic or psychiatric systems, 10 systems reviewed.  Marland Kitchen amLODipine  5 mg Oral Daily  . carvedilol  12.5 mg Oral BID WC  . gabapentin  300 mg Oral BID  . heparin  5,000 Units Subcutaneous BID  . insulin aspart  0-5 Units Subcutaneous QHS  . insulin aspart  0-9 Units Subcutaneous TID WC  . insulin glargine  20 Units Subcutaneous BID  . meloxicam  7.5 mg Oral Daily  . pantoprazole  40 mg Oral Daily  . potassium chloride  10 mEq Oral BID   hydrALAZINE, morphine injection, ondansetron **OR** ondansetron (ZOFRAN) IV, oxyCODONE   Past Medical History  Diagnosis Date  . Hyperlipidemia   . H/O cardiac catheterization 06/03/06,12/03/11  . BPH (benign prostatic hypertrophy)   . Anxiety   . Syncope     LAUGHING INDUCED  . Myocardial infarction ([place])     x 2  . CHF (congestive heart failure) ([place])   . CAD (coronary artery disease) 06/03/06    cypher 2.5 x 82mm DES for 80% mid RCA sees Dr. Ubaldo Glassing  . Asthma     as a child  . Sleep apnea     hx of  . Kidney stone     hx of  . GERD (gastroesophageal reflux disease)     on medication for  . 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 Arthritis   . Anemia     taking iron  . Cataract, bilateral   . Neuromuscular disorder ([place])     diabetic neuropathy  . Diabetes mellitus without complication [facility])     Past Surgical History  Procedure Laterality Date  . Spine surgery  9/05    CERVICAL LAMINECTOMY  . Cervical herniated     . Eye surgery      as a child  . Coronary artery bypass graft  12/12/2011    Procedure: CORONARY ARTERY BYPASS GRAFTING (CABG);  Surgeon: Gaye Pollack, MD;  Location: [place];  Service: Open Heart Surgery;  Laterality: N/A;  coronary artery bypass graft times four using left internal mammary artery, right leg greater saphenous vein harvested endoscopically  . Cardiac catheterization N/A 10/26/2014    Procedure: Left Heart Cath;  Surgeon: Isaias Cowman, MD;  Location: [place] CV LAB;  Service: Cardiovascular;  Laterality: N/A;    PHYSICAL EXAM:  BP 174/69 mmHg  Pulse 67  Temp(Src) 98 F (36.7 C) (Oral)  Resp 22  Ht 5\' 11"  (1.803 m)  Wt 91.264 kg (201 lb 3.2 oz)  BMI 28.07 kg/m2  SpO2 98%  Wt Readings from Last 3 Encounters:  02/10/15 91.264 kg (201 lb 3.2 oz)  01/22/15 92.08 kg (203 lb)  10/27/14 94.031 kg (207 lb 4.8 oz)           BP Readings from Last 3 Encounters:  02/14/15 174/69  02/12/15 161/65  01/22/15 169/75    Constitutional: NAD Neck: supple, no thyromegaly Respiratory: CTA, no rales or wheezes Cardiovascular: RRR, no murmur, no gallop Abdomen: soft, good BS, nontender Extremities:  no edema Neuro: alert and oriented, no focal motor or sensory deficits  ASSESSMENT/PLAN:  Labs and imaging studies were reviewed  Painless jaundice-ERCP brushings pending, appears to have a pancreatic mass, imaging limited by hardware, GI following, oncology evaluation CAD-no sign for ischemia, recent carotid Doppler showed less than 50% stenosis, holding aspirin for now Diabetes mellitus, insulin requiring-on half dose currently with sliding scale 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

## 2015-02-14 NOTE — Care Management Note (Addendum)
Case Management Note  Patient Details  Name: [name] MRN: [id number] Date of Birth: 14-Dec-1940  Subjective/Objective:  Admitted with jaundice. Found to have a pancreatic mass. Oncology consult pending.  ERCP yesterday with improvement in jaundice. PET recommended. Met with patient and his wife to discuss discharge planning. Patient would benefit from home health nursing and PT. Offered choice of agencies. They prefer [facility]. Referral called to [facility] with [facility]. PCP is Emily Filbert.                   Action/Plan: Home with [place]. Will follow up tomorrow with patient and family regarding oncology recommendations. Possible DC tomorrow.   Expected Discharge Date:                  Expected Discharge Plan:  [place]  In-House Referral:     Discharge planning Services  CM Consult  Post Acute Care Choice:  Home Health Choice offered to:  Patient, Spouse  DME Arranged:    DME Agency:     HH Arranged:  RN, PT [place] Agency:  [place]  Status of Service:  In process, will continue to follow  Medicare Important Message Given:  Yes-second notification given Date Medicare IM Given:    Medicare IM give by:    Date Additional Medicare IM Given:    Additional Medicare Important Message give by:     If discussed at [place] of Stay Meetings, dates discussed:    Additional Comments:  Jolly Mango, RN 02/14/2015, 1:02 PM

## 2015-02-14 NOTE — Progress Notes (Signed)
[place]  [phone] [phone]  ID: [name] OB: 12/31/1940  MR#: [id number]  TML#:[id number]  Patient Care Team: Rusty Aus, MD as PCP - General (Unknown Physician Specialty)  CHIEF COMPLAINT:  Chief Complaint  Patient presents with  . Pancreatitis    INTERVAL HISTORY: Patient with ERCP yesterday with improvement of his jaundice. He continues to feel weak and fatigued, but otherwise feels well. Family at bedside.  REVIEW OF SYSTEMS:   Review of Systems  Constitutional: Positive for malaise/fatigue. Negative for fever.  Respiratory: Negative.   Cardiovascular: Negative.   Gastrointestinal: Negative.   Skin:       Jaundiced.  Neurological: Positive for weakness.    As per HPI. Otherwise, a complete review of systems is negatve.  PAST MEDICAL HISTORY: Past Medical History  Diagnosis Date  . Hyperlipidemia   . H/O cardiac catheterization 06/03/06,12/03/11  . BPH (benign prostatic hypertrophy)   . Anxiety   . Syncope     LAUGHING INDUCED  . Myocardial infarction ([facility])     x 2  . CHF (congestive heart failure) ([place])   . CAD (coronary artery disease) 06/03/06    cypher 2.5 x 76mm DES for 80% mid RCA sees Dr. Ubaldo Glassing  . Asthma     as a child  . Sleep apnea     hx of  . Kidney stone     hx of  . GERD (gastroesophageal reflux disease)     on medication for  . 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 Arthritis   . Anemia     taking iron  . Cataract, bilateral   . Neuromuscular disorder ([facility])     diabetic neuropathy  . Diabetes mellitus without complication ([facility])     PAST SURGICAL HISTORY: Past Surgical History  Procedure Laterality Date  . Spine surgery  9/05    CERVICAL LAMINECTOMY  . Cervical herniated    . Eye surgery      as a child  . Coronary artery bypass graft  12/12/2011    Procedure: CORONARY ARTERY BYPASS GRAFTING (CABG);  Surgeon: Gaye Pollack, MD;  Location: [facility];  Service:  Open Heart Surgery;  Laterality: N/A;  coronary artery bypass graft times four using left internal mammary artery, right leg greater saphenous vein harvested endoscopically  . Cardiac catheterization N/A 10/26/2014    Procedure: Left Heart Cath;  Surgeon: Isaias Cowman, MD;  Location: [facility];  Service: Cardiovascular;  Laterality: N/A;    FAMILY HISTORY Family History  Problem Relation Age of Onset  . Diabetes Mellitus II Mother     Also Brother and sister  . Leukemia Father        ADVANCED DIRECTIVES:    HEALTH MAINTENANCE: Social History  Substance Use Topics  . Smoking status: Former Smoker    Types: Cigarettes, Cigars    Quit date: 12/03/1977  . Smokeless tobacco: Never Used  . Alcohol Use: No     Colonoscopy:  PAP:  Bone density:  Lipid panel:  Allergies  Allergen Reactions  . Zithromax [Azithromycin] Anaphylaxis and Other (See Comments)    Reaction:  Stroke-like symptoms   . Lisinopril Other (See Comments)    Per spouse, pt did not tolerate well in the past.      Current Facility-Administered Medications  Medication Dose Route Frequency Provider Last Rate Last Dose  . 0.45 % sodium chloride infusion  Intravenous Continuous Rusty Aus, MD 75 mL/hr at 02/13/15 2308    . amLODipine (NORVASC) tablet 5 mg  5 mg Oral Daily Vishwanath Hande, MD   5 mg at 02/14/15 1104  . carvedilol (COREG) tablet 12.5 mg  12.5 mg Oral BID WC Rusty Aus, MD   12.5 mg at 02/14/15 0856  . gabapentin (NEURONTIN) capsule 300 mg  300 mg Oral BID Lytle Butte, MD   300 mg at 02/14/15 0856  . heparin injection 5,000 Units  5,000 Units Subcutaneous BID Rusty Aus, MD   5,000 Units at 02/14/15 1103  . hydrALAZINE (APRESOLINE) injection 10 mg  10 mg Intravenous Q4H PRN Lytle Butte, MD   10 mg at 02/13/15 2224  . insulin aspart (novoLOG) injection 0-5 Units  0-5 Units Subcutaneous QHS Lytle Butte, MD   3 Units at 02/12/15 2243  . insulin aspart (novoLOG) injection  0-9 Units  0-9 Units Subcutaneous TID WC Lytle Butte, MD   5 Units at 02/14/15 1215  . insulin glargine (LANTUS) injection 20 Units  20 Units Subcutaneous BID Lytle Butte, MD   20 Units at 02/14/15 1215  . meloxicam (MOBIC) tablet 7.5 mg  7.5 mg Oral Daily Lytle Butte, MD   7.5 mg at 02/14/15 1104  . morphine 2 MG/ML injection 2 mg  2 mg Intravenous Q4H PRN Lytle Butte, MD   2 mg at 02/13/15 1026  . ondansetron (ZOFRAN) tablet 4 mg  4 mg Oral Q6H PRN Lytle Butte, MD       Or  . ondansetron [facility]) injection 4 mg  4 mg Intravenous Q6H PRN Lytle Butte, MD      . oxyCODONE (Oxy IR/ROXICODONE) immediate release tablet 5 mg  5 mg Oral Q4H PRN Lytle Butte, MD   5 mg at 02/12/15 2125  . pantoprazole (PROTONIX) EC tablet 40 mg  40 mg Oral Daily Lytle Butte, MD   40 mg at 02/14/15 1104  . potassium chloride (K-DUR,KLOR-CON) CR tablet 10 mEq  10 mEq Oral BID Rusty Aus, MD   10 mEq at 02/14/15 1104    OBJECTIVE: Filed Vitals:   02/14/15 1229  BP: 168/81  Pulse: 66  Temp: 97.7 F (36.5 C)  Resp: 19     Body mass index is 28.07 kg/(m^2).    ECOG FS:0 - Asymptomatic  General: Well-developed, well-nourished, no acute distress. Eyes: icteric sclera. Musculoskeletal: No edema, cyanosis, or clubbing. Neuro: Alert, answering all questions appropriately. Cranial nerves grossly intact. Skin: Jaundice. Psych: Normal affect.   LAB RESULTS:  Lab Results  Component Value Date   NA 137 02/13/2015   K 3.3* 02/13/2015   CL 107 02/13/2015   CO2 24 02/13/2015   GLUCOSE 258* 02/13/2015   BUN 16 02/13/2015   CREATININE 0.90 02/13/2015   CALCIUM 8.5* 02/13/2015   PROT 6.2* 02/13/2015   ALBUMIN 2.3* 02/13/2015   AST 235* 02/13/2015   ALT 270* 02/13/2015   ALKPHOS 393* 02/13/2015   BILITOT 13.6* 02/13/2015   GFRNONAA >60 02/13/2015   GFRAA >60 02/13/2015    Lab Results  Component Value Date   WBC 6.9 02/14/2015   NEUTROABS 4.9 02/14/2015   HGB 10.0* 02/14/2015   HCT 30.4*  02/14/2015   MCV 84.7 02/14/2015   PLT 250 02/14/2015     STUDIES: Ct Abdomen Wo Contrast  03-08-15  CLINICAL DATA:  Elevated LFTs and bilirubin, jaundice EXAM: CT ABDOMEN WITHOUT CONTRAST  TECHNIQUE: Multidetector CT imaging of the abdomen was performed following the standard protocol without IV contrast. Sagittal and coronal MPR images reconstructed from axial data set. Patient drank dilute oral contrast for exam. COMPARISON:  None. FINDINGS: Minimal atelectasis dependently LEFT lower lobe. Liver, spleen, kidneys and adrenal glands unremarkable for noncontrast technique. Normal appendix. Heterogeneous region of low attenuation at the pancreatic body extending into the proximal tail could represent focal pancreatitis though a more focal area of low attenuation 21 x 17 mm is identified, cannot exclude tumor. No definite biliary dilatation, CBD 12 mm diameter. No definite biliary tract calcifications. Tiny hiatal hernia. Visualized bowel loops unremarkable. Scattered atherosclerotic calcifications. No additional mass, adenopathy free air or free fluid. IMPRESSION: Abnormal appearance to the pancreatic body which could be related to focal pancreatitis though mass/ tumor not excluded ; MR imaging recommended to exclude pancreatic neoplasm. Mild biliary dilatation, can evaluate by MRCP sequence at time of MR imaging. Electronically Signed   By: Lavonia Dana M.D.   On: 02/10/2015 16:26   Dg Chest 2 View  01/22/2015  CLINICAL DATA:  Chest pain EXAM: CHEST  2 VIEW COMPARISON:  10/26/2014 FINDINGS: Prior CABG. Heart size mildly enlarged. Negative for heart failure. Lungs are clear. IMPRESSION: No active cardiopulmonary disease. Electronically Signed   By: Franchot Gallo M.D.   On: 01/22/2015 10:03   Ct Head Wo Contrast  02/10/2015  CLINICAL DATA:  Altered mental status EXAM: CT HEAD WITHOUT CONTRAST TECHNIQUE: Contiguous axial images were obtained from the base of the skull through the vertex without  intravenous contrast. COMPARISON:  None. FINDINGS: Motion degraded images. No evidence of parenchymal hemorrhage or extra-axial fluid collection. No mass lesion, mass effect, or midline shift. No CT evidence of acute infarction. Mild cortical atrophy.  No ventriculomegaly. The visualized paranasal sinuses are essentially clear. The mastoid air cells are unopacified. No evidence of calvarial fracture. IMPRESSION: Motion degraded images. No evidence of acute intracranial abnormality. Mild cortical atrophy. Electronically Signed   By: Julian Hy M.D.   On: 02/10/2015 20:27   US Carotid Bilateral  02/10/2015  CLINICAL DATA:  Syncope and history of diabetes, coronary artery disease and prior CABG. EXAM: BILATERAL CAROTID DUPLEX ULTRASOUND TECHNIQUE: Pearline Cables scale imaging, color Doppler and duplex ultrasound were performed of bilateral carotid and vertebral arteries in the neck. COMPARISON:  None. FINDINGS: Criteria: Quantification of carotid stenosis is based on velocity parameters that correlate the residual internal carotid diameter with NASCET-based stenosis levels, using the diameter of the distal internal carotid lumen as the denominator for stenosis measurement. The following velocity measurements were obtained: RIGHT ICA:  73/12 cm/sec CCA:  36/6 cm/sec SYSTOLIC ICA/CCA RATIO:  1.1 DIASTOLIC ICA/CCA RATIO:  1.3 ECA:  164 cm/sec LEFT ICA:  105/18 cm/sec CCA:  44/03 cm/sec SYSTOLIC ICA/CCA RATIO:  1.2 DIASTOLIC ICA/CCA RATIO:  1.0 ECA:  147 cm/sec RIGHT CAROTID ARTERY: Mild amount of calcified plaque is present at the level of the distal bulb and proximal ICA. Velocities and waveforms are normal and estimated right ICA stenosis is less than 50%. RIGHT VERTEBRAL ARTERY: Antegrade flow with normal waveform and velocity. LEFT CAROTID ARTERY: There is a mild amount of calcified plaque at the level of the left carotid bulb and proximal ICA. Overall plaque volume is slightly greater on the left compared to the  right. Velocities and waveforms are normal and estimated left ICA stenosis is less than 50%. LEFT VERTEBRAL ARTERY: Antegrade flow with normal waveform and velocity. IMPRESSION: Mild amount of plaque at  the level of both carotid bulbs and proximal internal carotid arteries, left greater than right. No significant carotid stenosis is identified with estimated bilateral ICA stenoses of less than 50%. Electronically Signed   By: Aletta Edouard M.D.   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  PLAN:    1. Pancreatic mass: ERCP yesterday with stent placement. Pathology is pending at time of dictation. Patient also has a significantly elevated CA-19-9 of greater than 4000. His bilirubin is improving after his stent. Will get a dedicated contrast CT of the pancreas and liver for further evaluation of his mass. Patient can follow-up with Dr. Rogue Bussing upon discharge to discuss his pathology results, further diagnostic workup, and treatment planning.  Approximately 30 minutes was spent in discussion and consultation with the patient and his family.  Lloyd Huger, MD   02/14/2015 1:44 PM

## 2015-02-15 DIAGNOSIS — R978 Other abnormal tumor markers: Secondary | ICD-10-CM

## 2015-02-15 LAB — CBC WITH DIFFERENTIAL/PLATELET
Basophils Absolute: 0.2 10*3/uL — ABNORMAL HIGH (ref 0–0.1)
Basophils Relative: 3 %
EOS ABS: 0.1 10*3/uL (ref 0–0.7)
Eosinophils Relative: 2 %
HCT: 28.8 % — ABNORMAL LOW (ref 40.0–52.0)
Hemoglobin: 9.3 g/dL — ABNORMAL LOW (ref 13.0–18.0)
LYMPHS PCT: 18 %
Lymphs Abs: 1.2 10*3/uL (ref 1.0–3.6)
MCH: 27.4 pg (ref 26.0–34.0)
MCHC: 32.3 g/dL (ref 32.0–36.0)
MCV: 85 fL (ref 80.0–100.0)
MONOS PCT: 7 %
Metamyelocytes Relative: 1 %
Monocytes Absolute: 0.5 10*3/uL (ref 0.2–1.0)
NEUTROS ABS: 4.6 10*3/uL (ref 1.4–6.5)
Neutrophils Relative %: 69 %
Platelets: 248 10*3/uL (ref 150–440)
RBC: 3.39 MIL/uL — AB (ref 4.40–5.90)
RDW: 18.1 % — ABNORMAL HIGH (ref 11.5–14.5)
SMEAR REVIEW: ADEQUATE
WBC: 6.6 10*3/uL (ref 3.8–10.6)

## 2015-02-15 LAB — LIPASE, BLOOD: LIPASE: 49 U/L (ref 11–51)

## 2015-02-15 LAB — GLUCOSE, CAPILLARY: GLUCOSE-CAPILLARY: 281 mg/dL — AB (ref 65–99)

## 2015-02-15 NOTE — Care Management Note (Signed)
Case Management Note  Patient Details  Name: [name] MRN: [id number] Date of Birth: 1940-07-03  Subjective/Objective:    Case discussed with PCP, Dr. Emily Filbert. Home health PT only is most appropriate at this time.   Action/Plan:  Will verify demographics. Corene Cornea with [facility] notified of discharge. Updated primary nurse.                 Expected Discharge Date:    02/15/2015              Expected Discharge Plan:  [place]  In-House Referral:     Discharge planning Services  CM Consult  Post Acute Care Choice:  Home Health Choice offered to:  Patient, Spouse  DME Arranged:    DME Agency:     HH Arranged:  PT [place]:  [place]  Status of Service:  In process, will continue to follow  Medicare Important Message Given:  Yes-second notification given Date Medicare IM Given:    Medicare IM give by:    Date Additional Medicare IM Given:    Additional Medicare Important Message give by:     If discussed at [place] of Stay Meetings, dates discussed:    Additional Comments:  Jolly Mango, RN 02/15/2015, 8:28 AM

## 2015-02-15 NOTE — Discharge Summary (Signed)
[name], is a 74 y.o. male  DOB [birth year]  MRN [id number].  Admission date:  02/10/2015  Admitting Physician  Lytle Butte, MD  Discharge Date:  02/15/2015    Admission Diagnosis  Jaundice [R17] Transaminitis [R74.0] Altered mental status, unspecified altered mental status type [R41.82]  Discharge Diagnoses    Painless obstructive jaundice with pancreatic lesion Diabetes mellitus, insulin requiring Hypertension Coronary artery disease Chronic diastolic congestive heart failure Sleep apnea   Past Medical History  Diagnosis Date  . Hyperlipidemia   . H/O cardiac catheterization 06/03/06,12/03/11  . BPH (benign prostatic hypertrophy)   . Anxiety   . Syncope     LAUGHING INDUCED  . Myocardial infarction ([facility])     x 2  . CHF (congestive heart failure) ([place])   . CAD (coronary artery disease) 06/03/06    cypher 2.5 x 29mm DES for 80% mid RCA sees Dr. Ubaldo Glassing  . Asthma     as a child  . Sleep apnea     hx of  . Kidney stone     hx of  . GERD (gastroesophageal reflux disease)     on medication for  . 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 Arthritis   . Anemia     taking iron  . Cataract, bilateral   . Neuromuscular disorder ([place])     diabetic neuropathy  . Diabetes mellitus without complication [facility])     Past Surgical History  Procedure Laterality Date  . Spine surgery  9/05    CERVICAL LAMINECTOMY  . Cervical herniated    . Eye surgery      as a child  . Coronary artery bypass graft  12/12/2011    Procedure: CORONARY ARTERY BYPASS GRAFTING (CABG);  Surgeon: Gaye Pollack, MD;  Location: [facility];  Service: Open Heart Surgery;  Laterality: N/A;  coronary artery bypass graft times four using left internal mammary artery, right leg greater saphenous vein harvested endoscopically  . Cardiac catheterization N/A 10/26/2014    Procedure: Left Heart Cath;  Surgeon:  Isaias Cowman, MD;  Location: [facility];  Service: Cardiovascular;  Laterality: N/A;       History of present illness and  Hospital Course:     Kindly see H&P for history of present illness and admission details, please review complete Labs, Consult reports and Test reports for all details in brief  HPI  from the history and physical done on the day of admission    Hospital Course    Patient was admitted with a bilirubin of 11 and underwent ERCP with stenting with drop in bilirubin down to 3 on discharge. CT of the abdomen initially showed a mass in the head of the pancreas however follow-up CT did not show that but showed a pancreatitis. Patient has had chronic dizziness and now is better. Blood pressure stable, sugar stable. Biliary washings pending.   Discharge Condition: Stable   Follow UP  Dr.  Brahmanday 1 week Dr Allen Norris 2 weeks Dr. Sabra Heck one week    Discharge Instructions  and  Discharge Medications   Aspirin 81 mg Gabapentin 300 mg twice a day Amaryl 4 mg daily Lantus insulin 20-40 units twice a day Omeprazole 20 mg twice a day Torsemide 20 mg daily as needed  Today   Subjective:   [name] today has no headache,no chest abdominal pain,no new weakness tingling or numbness, feels much better wants to go home today.   Objective:   Blood pressure 164/63, pulse 69, temperature 98 F (36.7 C), temperature source Oral, resp. rate 20, height 5\' 11"  (1.803 m), weight 91.264 kg (201 lb 3.2 oz), SpO2 98 %.   Exam Awake Alert, Oriented x 3, No new F.N deficits, Normal affect [place].AT,PERRAL Supple Neck,No JVD, No cervical lymphadenopathy appriciated.  Symmetrical Chest wall movement, Good air movement bilaterally, CTAB RRR,No Gallops,Rubs or new Murmurs, Abd Soft, Non tender, No rebound. No Cyanosis, Clubbing or edema, No new Rash or bruise  Total Time in preparing paper work, data evaluation and todays exam - 35 minutes  Ariday Brinker F. M.D  on 02/15/2015 at 7:42 AM

## 2015-02-15 NOTE — Progress Notes (Signed)
[name]   DOB:[birth year]   OQ#:[id number]    Subjective: Feels better status post ERCP. Mental status changes improved/back to baseline. No nausea no vomiting.   ROS: No chest pain or shortness of breath or cough.   Objective:  Filed Vitals:   02/15/15 0446  BP: 164/63  Pulse: 69  Temp: 98 F (36.7 C)  Resp: 20     Intake/Output Summary (Last 24 hours) at 02/15/15 0835 Last data filed at 02/15/15 0730  Gross per 24 hour  Intake   2316 ml  Output    600 ml  Net   1716 ml    GENERAL: Well-nourished well-developed; Alert, no distress; comfortable. He is accompanied by his wife. he is sitting in the chair eating breakfast. EYES: Positive for icterus  [improved] OROPHARYNX: no thrush or ulceration; good dentition  NECK: supple, no masses felt LYMPH: no palpable lymphadenopathy in the cervical, axillary or inguinal regions LUNGS: clear to auscultation and No wheeze or crackles HEART/CVS: regular rate & rhythm and no murmurs; No lower extremity edema ABDOMEN: abdomen soft, non-tender and normal bowel sounds Musculoskeletal:no cyanosis of digits and no clubbing  PSYCH: alert & oriented x 3 with fluent speech NEURO: no focal motor/sensory deficits SKIN: no rashes or significant lesions  Labs:  Lab Results  Component Value Date   WBC 6.6 02/15/2015   HGB 9.3* 02/15/2015   HCT 28.8* 02/15/2015   MCV 85.0 02/15/2015   PLT 248 02/15/2015   NEUTROABS 4.6 02/15/2015    Lab Results  Component Value Date   NA 138 02/14/2015   K 3.5 02/14/2015   CL 111 02/14/2015   CO2 23 02/14/2015    Studies:  Ct Abdomen Pelvis W Contrast  02/14/2015  CLINICAL DATA:  Diabetic with weakness and elevated bilirubin. Abnormal pancreas on recent noncontrast CT. EXAM: CT ABDOMEN AND PELVIS WITH CONTRAST TECHNIQUE: Multidetector CT imaging of the abdomen and pelvis was performed using the standard protocol following bolus administration of intravenous contrast. CONTRAST:  159mL  OMNIPAQUE IOHEXOL 300 MG/ML  SOLN COMPARISON:  Noncontrast abdominal CT 02/10/2015. FINDINGS: Lower chest: Mild emphysematous changes and subpleural reticulation are noted at both lung bases. The heart is enlarged status post median sternotomy. There is diffuse atherosclerosis of the coronary arteries and thoracic aorta. There are trace left-greater-than-right pleural effusions. Hepatobiliary: The liver demonstrates mildly decreased density consistent with steatosis. No focal hepatic abnormalities are identified. There is pneumobilia status post interval plastic biliary stent placement. The stent appears well positioned. The gallbladder is decompressed. Pancreas: The pancreatic duct is mildly dilated to 8 mm. There is mild soft tissue stranding around the pancreas suspicious for inflammation. Low-density lesions within the pancreatic head are again noted, measuring 13 mm on image 32 and 20 mm on image 35. These are similar to the recent noncontrast study. There is also some low density in the pancreatic tail, best seen on the reformatted images. No enhancing parenchymal lesion identified. Spleen: Normal in size without focal abnormality. Adrenals/Urinary Tract: Both adrenal glands appear normal. There is no evidence of urinary tract calculus, hydronephrosis or renal mass. The right kidney demonstrates mild cortical thinning and a small cyst in its interpolar region. The left kidney appears normal. No bladder abnormality seen. Stomach/Bowel: No evidence of bowel wall thickening, distention or surrounding inflammatory change. The appendix appears normal. Vascular/Lymphatic: Prominent lymph nodes in the upper abdomen are likely reactive, including a 15 mm node in the gastrohepatic ligament on image 27 and a 12 mm portacaval node  on image 34. There is no retroperitoneal lymphadenopathy. There is moderate atherosclerosis of the aorta, its branches and the iliac arteries. Reproductive: Mild enlargement of the prostate  gland. Other: There is a small amount of ascites with retroperitoneal edema tracking along the pararenal spaces bilaterally. There is asymmetric fat in the left inguinal canal. Musculoskeletal: No acute or significant osseous findings. IMPRESSION: 1. Interval biliary stent placement with resulting pneumobilia and decompression of the biliary system and gallbladder. 2. Nonspecific low-density lesions within the pancreatic head and tail with persistent mild pancreatic ductal dilatation. Findings are favored to reflect acute pancreatitis with small pseudocysts, although cystic neoplasm cannot be excluded by this examination. Correlation with prior ERCP results recommended. 3. Imaging follow-up after the presumed acute inflammatory changes have resolved is recommended (2-4 months, preferably with MRI). Electronically Signed   By: Richardean Sale M.D.   On: 02/14/2015 16:39    Assessment & Plan:   # Painless/objective jaundice- status post ERCP/stent placement. Question etiology- CT of the abdomen and pelvis does not show any obvious mass. [id number]. Question cystic neoplasm. Likely need EUS for further delineation. ERCP/brushings still pending.  # I reviewed the results of the CT scan with the patient and his wife in detail. A copy of the report was given. I will talk to Dr. Rein/GI regarding the need for EUS. I spoke to Dr.Wohl yesterday.   # And also arrange a follow-up in the next week or so in the [facility].   Cammie Sickle, MD 02/15/2015  8:35 AM

## 2015-02-15 NOTE — Progress Notes (Signed)
Pt A and O x 4. VSS. Pt tolerating diet well. No complaints of pain or nausea. IV removed intact, prescriptions given. Pt voiced understanding of discharge instructions with no further questions. Pt discharged via wheelchair with axillary. Pt had prescription of pain meds that was unsigned and not listed on discharge papers. Prescription was shredded prior to discharge of patient.

## 2015-02-16 ENCOUNTER — Encounter: Payer: Self-pay | Admitting: Gastroenterology

## 2015-02-17 ENCOUNTER — Telehealth: Payer: Self-pay

## 2015-02-17 NOTE — Telephone Encounter (Signed)
  Oncology Nurse Navigator Documentation  Referral date to RadOnc/MedOnc: 02/17/15 (02/17/15 1400)           Interventions: Coordination of Care (02/17/15 1400)   Coordination of Care: EUS (02/17/15 1400)        Time Spent with Patient: 30 (02/17/15 1400)   EUS scheduled for 02-23-15 with Dr Mont Dutton from [place]. Went over instructions for EUS and copy mailed to home address. [place] only anticoagulant. Contact information provided for any further questions or concerns.  INSTRUCTIONS FOR ENDOSCOPIC ULTRASOUND  -Your procedure has been scheduled for November 3rd with Dr Mont Dutton at [facility] will contact you to pre-register over the phone. If for any reason you have not received a call within one week prior to your scheduled procedure date, please call [phone]. -To get your scheduled arrival time, please call the Endoscopy unit at  [phone] between 1-3pm on: November 2nd   -ON THE DAY OF YOU PROCEDURE:  1. If you are scheduled for a morning procedure, nothing to drink after midnight  -If you are scheduled for an afternoon procedure, you may have clear liquids until 5 hours prior  to the procedure but no carbonated drinks or broth  2. NO FOOD THE DAY OF YOUR PROCEDURE  3. You may take your heart, seizure, blood pressure, Parkinson's or breathing medications at  6am with just enough water to get your pills down  4. Do not take any oral Diabetic medications the morning of your procedure.  5. Do not take Vitamins  -On the day of your procedure, come to the [facility] Admitting/Registration desk (First desk on the right) at the scheduled arrival time. You MUST have someone drive you home from your procedure. You must have a responsible adult with a valid drivers license who is on site throughout your entire procedure and who can stay with you for several hours after your procedure. You may not go home alone in a taxi, shuttle [place] or bus, as the drivers will not be  responsible for you.  --If you have any questions please call me at the above contact

## 2015-02-22 ENCOUNTER — Inpatient Hospital Stay: Payer: Commercial Managed Care - HMO

## 2015-02-22 ENCOUNTER — Encounter: Payer: Self-pay | Admitting: Internal Medicine

## 2015-02-22 ENCOUNTER — Inpatient Hospital Stay: Payer: Commercial Managed Care - HMO | Attending: Internal Medicine | Admitting: Internal Medicine

## 2015-02-22 ENCOUNTER — Other Ambulatory Visit: Payer: Self-pay | Admitting: *Deleted

## 2015-02-22 VITALS — BP 158/73 | HR 71 | Temp 97.2°F | Resp 18 | Ht 71.0 in | Wt 208.3 lb

## 2015-02-22 DIAGNOSIS — Z85828 Personal history of other malignant neoplasm of skin: Secondary | ICD-10-CM | POA: Diagnosis not present

## 2015-02-22 DIAGNOSIS — R531 Weakness: Secondary | ICD-10-CM | POA: Diagnosis not present

## 2015-02-22 DIAGNOSIS — K831 Obstruction of bile duct: Secondary | ICD-10-CM | POA: Insufficient documentation

## 2015-02-22 DIAGNOSIS — M7989 Other specified soft tissue disorders: Secondary | ICD-10-CM | POA: Insufficient documentation

## 2015-02-22 DIAGNOSIS — Z7982 Long term (current) use of aspirin: Secondary | ICD-10-CM

## 2015-02-22 DIAGNOSIS — E1140 Type 2 diabetes mellitus with diabetic neuropathy, unspecified: Secondary | ICD-10-CM | POA: Insufficient documentation

## 2015-02-22 DIAGNOSIS — G4730 Sleep apnea, unspecified: Secondary | ICD-10-CM

## 2015-02-22 DIAGNOSIS — I517 Cardiomegaly: Secondary | ICD-10-CM | POA: Diagnosis not present

## 2015-02-22 DIAGNOSIS — R0609 Other forms of dyspnea: Secondary | ICD-10-CM | POA: Insufficient documentation

## 2015-02-22 DIAGNOSIS — F419 Anxiety disorder, unspecified: Secondary | ICD-10-CM | POA: Diagnosis not present

## 2015-02-22 DIAGNOSIS — I6521 Occlusion and stenosis of right carotid artery: Secondary | ICD-10-CM | POA: Diagnosis not present

## 2015-02-22 DIAGNOSIS — Z79899 Other long term (current) drug therapy: Secondary | ICD-10-CM | POA: Diagnosis not present

## 2015-02-22 DIAGNOSIS — Z794 Long term (current) use of insulin: Secondary | ICD-10-CM | POA: Diagnosis not present

## 2015-02-22 DIAGNOSIS — I509 Heart failure, unspecified: Secondary | ICD-10-CM | POA: Insufficient documentation

## 2015-02-22 DIAGNOSIS — C259 Malignant neoplasm of pancreas, unspecified: Secondary | ICD-10-CM | POA: Diagnosis present

## 2015-02-22 DIAGNOSIS — K760 Fatty (change of) liver, not elsewhere classified: Secondary | ICD-10-CM | POA: Diagnosis not present

## 2015-02-22 DIAGNOSIS — R978 Other abnormal tumor markers: Secondary | ICD-10-CM | POA: Diagnosis not present

## 2015-02-22 DIAGNOSIS — C251 Malignant neoplasm of body of pancreas: Secondary | ICD-10-CM

## 2015-02-22 DIAGNOSIS — R5383 Other fatigue: Secondary | ICD-10-CM | POA: Diagnosis not present

## 2015-02-22 DIAGNOSIS — N400 Enlarged prostate without lower urinary tract symptoms: Secondary | ICD-10-CM | POA: Diagnosis not present

## 2015-02-22 DIAGNOSIS — Z87442 Personal history of urinary calculi: Secondary | ICD-10-CM

## 2015-02-22 DIAGNOSIS — Z951 Presence of aortocoronary bypass graft: Secondary | ICD-10-CM | POA: Insufficient documentation

## 2015-02-22 DIAGNOSIS — G319 Degenerative disease of nervous system, unspecified: Secondary | ICD-10-CM | POA: Insufficient documentation

## 2015-02-22 DIAGNOSIS — M129 Arthropathy, unspecified: Secondary | ICD-10-CM | POA: Diagnosis not present

## 2015-02-22 DIAGNOSIS — R55 Syncope and collapse: Secondary | ICD-10-CM | POA: Diagnosis not present

## 2015-02-22 DIAGNOSIS — R7989 Other specified abnormal findings of blood chemistry: Secondary | ICD-10-CM | POA: Diagnosis not present

## 2015-02-22 DIAGNOSIS — D649 Anemia, unspecified: Secondary | ICD-10-CM | POA: Insufficient documentation

## 2015-02-22 DIAGNOSIS — R109 Unspecified abdominal pain: Secondary | ICD-10-CM | POA: Diagnosis not present

## 2015-02-22 DIAGNOSIS — K219 Gastro-esophageal reflux disease without esophagitis: Secondary | ICD-10-CM | POA: Diagnosis not present

## 2015-02-22 DIAGNOSIS — I2510 Atherosclerotic heart disease of native coronary artery without angina pectoris: Secondary | ICD-10-CM

## 2015-02-22 DIAGNOSIS — J9811 Atelectasis: Secondary | ICD-10-CM | POA: Diagnosis not present

## 2015-02-22 DIAGNOSIS — E785 Hyperlipidemia, unspecified: Secondary | ICD-10-CM

## 2015-02-22 DIAGNOSIS — I252 Old myocardial infarction: Secondary | ICD-10-CM | POA: Insufficient documentation

## 2015-02-22 DIAGNOSIS — K8689 Other specified diseases of pancreas: Secondary | ICD-10-CM

## 2015-02-22 LAB — COMPREHENSIVE METABOLIC PANEL
ALT: 43 U/L (ref 17–63)
ANION GAP: 6 (ref 5–15)
AST: 41 U/L (ref 15–41)
Albumin: 3 g/dL — ABNORMAL LOW (ref 3.5–5.0)
Alkaline Phosphatase: 197 U/L — ABNORMAL HIGH (ref 38–126)
BUN: 23 mg/dL — AB (ref 6–20)
CHLORIDE: 102 mmol/L (ref 101–111)
CO2: 27 mmol/L (ref 22–32)
Calcium: 7.7 mg/dL — ABNORMAL LOW (ref 8.9–10.3)
Creatinine, Ser: 1.65 mg/dL — ABNORMAL HIGH (ref 0.61–1.24)
GFR calc Af Amer: 46 mL/min — ABNORMAL LOW (ref >60)
GFR, EST NON AFRICAN AMERICAN: 39 mL/min — AB (ref >60)
Glucose, Bld: 217 mg/dL — ABNORMAL HIGH (ref 65–99)
POTASSIUM: 3.9 mmol/L (ref 3.5–5.1)
Sodium: 135 mmol/L (ref 135–145)
TOTAL PROTEIN: 7.2 g/dL (ref 6.5–8.1)
Total Bilirubin: 3.1 mg/dL — ABNORMAL HIGH (ref 0.3–1.2)

## 2015-02-22 LAB — CBC WITH DIFFERENTIAL/PLATELET
BASOS ABS: 0.1 10*3/uL (ref 0–0.1)
BASOS PCT: 1 %
EOS PCT: 3 %
Eosinophils Absolute: 0.3 10*3/uL (ref 0–0.7)
HCT: 33 % — ABNORMAL LOW (ref 40.0–52.0)
Hemoglobin: 10.5 g/dL — ABNORMAL LOW (ref 13.0–18.0)
LYMPHS PCT: 25 %
Lymphs Abs: 2.3 10*3/uL (ref 1.0–3.6)
MCH: 27.3 pg (ref 26.0–34.0)
MCHC: 32 g/dL (ref 32.0–36.0)
MCV: 85.4 fL (ref 80.0–100.0)
MONO ABS: 0.6 10*3/uL (ref 0.2–1.0)
MONOS PCT: 7 %
Neutro Abs: 5.9 10*3/uL (ref 1.4–6.5)
Neutrophils Relative %: 64 %
PLATELETS: 278 10*3/uL (ref 150–440)
RBC: 3.86 MIL/uL — ABNORMAL LOW (ref 4.40–5.90)
RDW: 19.1 % — AB (ref 11.5–14.5)
WBC: 9.1 10*3/uL (ref 3.8–10.6)

## 2015-02-22 NOTE — Progress Notes (Signed)
[facility] CONSULT NOTE  Patient Care Team: Rusty Aus, MD as PCP - General (Unknown Physician Specialty) Clent Jacks, RN as Registered Nurse  CHIEF COMPLAINTS/PURPOSE OF CONSULTATION:  # OCT 2016-? Pancreatic ca; Ca- 19-9- 4003; CT A/P- no evidence of Pancreatic mass; Awaiting EUS on Nov 3rd  # OBSTRUCTIVE JAUNDICE s/p ERCP [s/p stenting; Dr.Wohl; Dr.Rein]  HISTORY OF PRESENTING ILLNESS:  [name] 74 y.o.  male was recently seen in the hospital for painless obstructive jaundice. Patient has noted above had ERCP with stenting with improvement of his jaundice.  Patient states that he is 80% better; close to baseline. He complains of intermittent abdominal discomfort which is not very intense. Appetite is good. No weight loss. Denies any nausea vomiting or chest pain or shortness of breath. He still feels mildly fatigued. He has mild swelling in the legs.  ROS: A complete 10 point review of system is done which is negative except mentioned above in history of present illness  MEDICAL HISTORY:  Past Medical History  Diagnosis Date  . Hyperlipidemia   . H/O cardiac catheterization 06/03/06,12/03/11  . BPH (benign prostatic hypertrophy)   . Anxiety   . Syncope     LAUGHING INDUCED  . Myocardial infarction ([place])     x 2  . CHF (congestive heart failure) ([place])   . CAD (coronary artery disease) 06/03/06    cypher 2.5 x 22m DES for 80% mid RCA sees Dr. FUbaldo Glassing . Asthma     as a child  . Sleep apnea     hx of  . Kidney stone     hx of  . GERD (gastroesophageal reflux disease)     on medication for  . 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 Arthritis   . Anemia     taking iron  . Cataract, bilateral   . Neuromuscular disorder ([place]     diabetic neuropathy  . Diabetes mellitus without complication ([facility]     SURGICAL HISTORY: Past Surgical History  Procedure Laterality Date  . Spine surgery  9/05    CERVICAL LAMINECTOMY  .  Cervical herniated    . Eye surgery      as a child  . Coronary artery bypass graft  12/12/2011    Procedure: CORONARY ARTERY BYPASS GRAFTING (CABG);  Surgeon: BGaye Pollack MD;  [facility]  Service: Open Heart Surgery;  Laterality: N/A;  coronary artery bypass graft times four using left internal mammary artery, right leg greater saphenous vein harvested endoscopically  . Cardiac catheterization N/A 10/26/2014    Procedure: Left Heart Cath;  Surgeon: AIsaias Cowman MD;  [facility];  Service: Cardiovascular;  Laterality: N/A;  . Endoscopic retrograde cholangiopancreatography (ercp) with propofol N/A 02/13/2015    Procedure: ENDOSCOPIC RETROGRADE CHOLANGIOPANCREATOGRAPHY (ERCP) WITH PROPOFOL;  Surgeon: DLucilla Lame MD;  Location: [facility] ENDOSCOPY;  Service: Endoscopy;  Laterality: N/A;    SOCIAL HISTORY: Social History   Social History  . Marital Status: Married    Spouse Name: N/A  . Number of Children: N/A  . Years of Education: N/A   Occupational History  . Not on file.   Social History Main Topics  . Smoking status: Former Smoker    Types: Cigarettes, Cigars    Quit date: 12/03/1977  . Smokeless tobacco: Never Used  . Alcohol Use: No  . Drug Use: No  . Sexual Activity: Not  on file   Other Topics Concern  . Not on file   Social History Narrative   Lives with his wife    FAMILY HISTORY: Family History  Problem Relation Age of Onset  . Diabetes Mellitus II Mother     Also Brother and sister  . Pancreatic cancer Mother   . Leukemia Father   . Diabetes Mellitus II Sister   . Diabetes Mellitus II Brother     ALLERGIES:  is allergic to zithromax and lisinopril.  MEDICATIONS:  Current Outpatient Prescriptions  Medication Sig Dispense Refill  . acetaminophen (TYLENOL) 325 MG tablet Take 650 mg by mouth 2 (two) times daily as needed.    Marland Kitchen aspirin EC 81 MG tablet Take 81 mg by mouth daily.    Marland Kitchen gabapentin (NEURONTIN) 300 MG capsule Take  300 mg by mouth 2 (two) times daily.    Marland Kitchen glimepiride (AMARYL) 4 MG tablet Take 4 mg by mouth daily.    . insulin glargine (LANTUS) 100 UNIT/ML injection Inject 40 Units into the skin 2 (two) times daily.    . insulin regular (NOVOLIN R,HUMULIN R) 100 units/mL injection Inject 15-20 Units into the skin 3 (three) times daily before meals. Pt uses as needed per sliding scale.    . lovastatin (MEVACOR) 20 MG tablet Take 20 mg by mouth at bedtime.    Marland Kitchen omeprazole (PRILOSEC) 20 MG capsule Take 20 mg by mouth 2 (two) times daily.    Marland Kitchen torsemide (DEMADEX) 20 MG tablet Take 20 mg by mouth 2 (two) times daily.      No current facility-administered medications for this visit.      Marland Kitchen  PHYSICAL EXAMINATION: ECOG PERFORMANCE STATUS: 1 - Symptomatic but completely ambulatory  Filed Vitals:   02/22/15 1347  BP: 158/73  Pulse: 71  Temp: 97.2 F (36.2 C)  Resp: 18   Filed Weights   02/22/15 1347  Weight: 208 lb 5.4 oz (94.5 kg)    GENERAL: Well-nourished well-developed; Alert, no distress and comfortable.   Accompanied by wife/daughter. EYES: Positive for icterus OROPHARYNX: no thrush or ulceration; poor dentition NECK: supple, no masses felt LYMPH:  no palpable lymphadenopathy in the cervical, axillary or inguinal regions LUNGS: clear to auscultation and  No wheeze or crackles HEART/CVS: regular rate & rhythm and no murmurs; 1+ bilateral lower extremity edema ABDOMEN: abdomen soft, non-tender and normal bowel sounds Musculoskeletal:no cyanosis of digits and no clubbing  PSYCH: alert & oriented x 3 with fluent speech NEURO: no focal motor/sensory deficits SKIN:  no rashes or significant lesions  LABORATORY DATA:  I have reviewed the data as listed Lab Results  Component Value Date   WBC 9.1 02/22/2015   HGB 10.5* 02/22/2015   HCT 33.0* 02/22/2015   MCV 85.4 02/22/2015   PLT 278 02/22/2015    Recent Labs  02/13/15 0507 02/14/15 0646 02/22/15 1255  NA 137 138 135  K 3.3* 3.5  3.9  CL 107 111 102  CO2 _0 GLUCOSE 258* 150* 217*  BUN 16 15 23*  CREATININE 0.90 0.96 1.65*  CALCIUM 8.5* 8.2* 7.7*  GFRNONAA >60 >60 39*  GFRAA >60 >60 46*  PROT 6.2* 5.0* 7.2  ALBUMIN 2.3* 1.9* 3.0*  AST 235* 152* 41  ALT 270* 212* 43  ALKPHOS 393* 348* 197*  BILITOT 13.6* 8.9* 3.1*    RADIOGRAPHIC STUDIES: I have personally reviewed the radiological images as listed and agreed with the findings in the report. Ct Abdomen Wo Contrast  02/10/2015  CLINICAL DATA:  Elevated LFTs and bilirubin, jaundice EXAM: CT ABDOMEN WITHOUT CONTRAST TECHNIQUE: Multidetector CT imaging of the abdomen was performed following the standard protocol without IV contrast. Sagittal and coronal MPR images reconstructed from axial data set. Patient drank dilute oral contrast for exam. COMPARISON:  None. FINDINGS: Minimal atelectasis dependently LEFT lower lobe. Liver, spleen, kidneys and adrenal glands unremarkable for noncontrast technique. Normal appendix. Heterogeneous region of low attenuation at the pancreatic body extending into the proximal tail could represent focal pancreatitis though a more focal area of low attenuation 21 x 17 mm is identified, cannot exclude tumor. No definite biliary dilatation, CBD 12 mm diameter. No definite biliary tract calcifications. Tiny hiatal hernia. Visualized bowel loops unremarkable. Scattered atherosclerotic calcifications. No additional mass, adenopathy free air or free fluid. IMPRESSION: Abnormal appearance to the pancreatic body which could be related to focal pancreatitis though mass/ tumor not excluded ; MR imaging recommended to exclude pancreatic neoplasm. Mild biliary dilatation, can evaluate by MRCP sequence at time of MR imaging. Electronically Signed   By: Lavonia Dana M.D.   On: 02/10/2015 16:26   Ct Head Wo Contrast  02/10/2015  CLINICAL DATA:  Altered mental status EXAM: CT HEAD WITHOUT CONTRAST TECHNIQUE: Contiguous axial images were obtained from the  base of the skull through the vertex without intravenous contrast. COMPARISON:  None. FINDINGS: Motion degraded images. No evidence of parenchymal hemorrhage or extra-axial fluid collection. No mass lesion, mass effect, or midline shift. No CT evidence of acute infarction. Mild cortical atrophy.  No ventriculomegaly. The visualized paranasal sinuses are essentially clear. The mastoid air cells are unopacified. No evidence of calvarial fracture. IMPRESSION: Motion degraded images. No evidence of acute intracranial abnormality. Mild cortical atrophy. Electronically Signed   By: Julian Hy M.D.   On: 02/10/2015 20:27   Ct Abdomen Pelvis W Contrast  02/14/2015  CLINICAL DATA:  Diabetic with weakness and elevated bilirubin. Abnormal pancreas on recent noncontrast CT. EXAM: CT ABDOMEN AND PELVIS WITH CONTRAST TECHNIQUE: Multidetector CT imaging of the abdomen and pelvis was performed using the standard protocol following bolus administration of intravenous contrast. CONTRAST:  146m OMNIPAQUE IOHEXOL 300 MG/ML  SOLN COMPARISON:  Noncontrast abdominal CT 02/10/2015. FINDINGS: Lower chest: Mild emphysematous changes and subpleural reticulation are noted at both lung bases. The heart is enlarged status post median sternotomy. There is diffuse atherosclerosis of the coronary arteries and thoracic aorta. There are trace left-greater-than-right pleural effusions. Hepatobiliary: The liver demonstrates mildly decreased density consistent with steatosis. No focal hepatic abnormalities are identified. There is pneumobilia status post interval plastic biliary stent placement. The stent appears well positioned. The gallbladder is decompressed. Pancreas: The pancreatic duct is mildly dilated to 8 mm. There is mild soft tissue stranding around the pancreas suspicious for inflammation. Low-density lesions within the pancreatic head are again noted, measuring 13 mm on image 32 and 20 mm on image 35. These are similar to the  recent noncontrast study. There is also some low density in the pancreatic tail, best seen on the reformatted images. No enhancing parenchymal lesion identified. Spleen: Normal in size without focal abnormality. Adrenals/Urinary Tract: Both adrenal glands appear normal. There is no evidence of urinary tract calculus, hydronephrosis or renal mass. The right kidney demonstrates mild cortical thinning and a small cyst in its interpolar region. The left kidney appears normal. No bladder abnormality seen. Stomach/Bowel: No evidence of bowel wall thickening, distention or surrounding inflammatory change. The appendix appears normal. Vascular/Lymphatic: Prominent lymph nodes in the upper  abdomen are likely reactive, including a 15 mm node in the gastrohepatic ligament on image 27 and a 12 mm portacaval node on image 34. There is no retroperitoneal lymphadenopathy. There is moderate atherosclerosis of the aorta, its branches and the iliac arteries. Reproductive: Mild enlargement of the prostate gland. Other: There is a small amount of ascites with retroperitoneal edema tracking along the pararenal spaces bilaterally. There is asymmetric fat in the left inguinal canal. Musculoskeletal: No acute or significant osseous findings. IMPRESSION: 1. Interval biliary stent placement with resulting pneumobilia and decompression of the biliary system and gallbladder. 2. Nonspecific low-density lesions within the pancreatic head and tail with persistent mild pancreatic ductal dilatation. Findings are favored to reflect acute pancreatitis with small pseudocysts, although cystic neoplasm cannot be excluded by this examination. Correlation with prior ERCP results recommended. 3. Imaging follow-up after the presumed acute inflammatory changes have resolved is recommended (2-4 months, preferably with MRI). Electronically Signed   By: Richardean Sale M.D.   On: 02/14/2015 16:39   US Carotid Bilateral  02/10/2015  CLINICAL DATA:  Syncope  and history of diabetes, coronary artery disease and prior CABG. EXAM: BILATERAL CAROTID DUPLEX ULTRASOUND TECHNIQUE: Pearline Cables scale imaging, color Doppler and duplex ultrasound were performed of bilateral carotid and vertebral arteries in the neck. COMPARISON:  None. FINDINGS: Criteria: Quantification of carotid stenosis is based on velocity parameters that correlate the residual internal carotid diameter with NASCET-based stenosis levels, using the diameter of the distal internal carotid lumen as the denominator for stenosis measurement. The following velocity measurements were obtained: RIGHT ICA:  73/12 cm/sec CCA:  25/0 cm/sec SYSTOLIC ICA/CCA RATIO:  1.1 DIASTOLIC ICA/CCA RATIO:  1.3 ECA:  164 cm/sec LEFT ICA:  105/18 cm/sec CCA:  53/97 cm/sec SYSTOLIC ICA/CCA RATIO:  1.2 DIASTOLIC ICA/CCA RATIO:  1.0 ECA:  147 cm/sec RIGHT CAROTID ARTERY: Mild amount of calcified plaque is present at the level of the distal bulb and proximal ICA. Velocities and waveforms are normal and estimated right ICA stenosis is less than 50%. RIGHT VERTEBRAL ARTERY: Antegrade flow with normal waveform and velocity. LEFT CAROTID ARTERY: There is a mild amount of calcified plaque at the level of the left carotid bulb and proximal ICA. Overall plaque volume is slightly greater on the left compared to the right. Velocities and waveforms are normal and estimated left ICA stenosis is less than 50%. LEFT VERTEBRAL ARTERY: Antegrade flow with normal waveform and velocity. IMPRESSION: Mild amount of plaque at the level of both carotid bulbs and proximal internal carotid arteries, left greater than right. No significant carotid stenosis is identified with estimated bilateral ICA stenoses of less than 50%. Electronically Signed   By: Aletta Edouard M.D.   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 Cytology from ERCP  unavailable. Awaiting EUS tomorrow. I recommend a PET scan for further evaluation. I recommend repeating CA-19-9 at next visit.  # Obstructive jaundice likely secondary to pancreatic malignancy; status post ERCP. Bilirubin down from 15 to 3 today. Repeat LFTs at next visit.  Patient was given a copy of his labs/CT scan. Written instructions were given.  All questions were answered. The patient knows to call the clinic with any problems, questions or concerns.  # I will see the patient back in approximately 1 week; with labs LFTs CA-19-9 ordered. I had spoken to Dr.Wohl; and Dr. Rayann Heman regarding  the patient. Drue Dun; nurse navigator also met with the patient today.   I spent 25 minutes counseling the patient face to face. The total time spent in the appointment was 30 minutes and more than 50% was on counseling.     Cammie Sickle, MD 02/22/2015 2:17 PM

## 2015-02-22 NOTE — Progress Notes (Signed)
Rn spent 5 minutes in discussing diabetic pet scan prep. Educated wife, daughter and patient.

## 2015-02-22 NOTE — Progress Notes (Signed)
  Oncology Nurse Navigator Documentation    Navigator Encounter Type: Initial MedOnc (02/22/15 1500) Patient Visit Type: Medonc (02/22/15 1500)       Interventions: Coordination of Care (02/22/15 1500)   Coordination of Care: EUS (02/22/15 1500)        Time Spent with Patient: 15 (02/22/15 1500)   Reviewed EUS instuctions with spouse during clinic visit. No new questions at this time. They have my contact information for any further needs.  INSTRUCTIONS FOR ENDOSCOPIC ULTRASOUND  -Your procedure has been scheduled for November 3rd with Dr Mont Dutton at [facility] will contact you to pre-register over the phone. If for any reason you have not received a call within one week prior to your scheduled procedure date, please call [phone]. -To get your scheduled arrival time, please call the Endoscopy unit at  [phone] between 1-3pm on: November 2nd   -ON THE DAY OF YOU PROCEDURE:  1. If you are scheduled for a morning procedure, nothing to drink after midnight  -If you are scheduled for an afternoon procedure, you may have clear liquids until 5 hours prior  to the procedure but no carbonated drinks or broth  2. NO FOOD THE DAY OF YOUR PROCEDURE  3. You may take your heart, seizure, blood pressure, Parkinson's or breathing medications at  6am with just enough water to get your pills down  4. Do not take any oral Diabetic medications the morning of your procedure.  5. Do not take Vitamins  -On the day of your procedure, come to the [facility] Admitting/Registration desk (First desk on the right) at the scheduled arrival time. You MUST have someone drive you home from your procedure. You must have a responsible adult with a valid drivers license who is on site throughout your entire procedure and who can stay with you for several hours after your procedure. You may not go home alone in a taxi, shuttle [place] or bus, as the drivers will not be responsible for  you.  --If you have any questions please call me at the above contact

## 2015-02-22 NOTE — Patient Instructions (Signed)
PET Scan A PET scan, also called positron emission tomography, is a test that creates pictures of the inside of the body. A PET scan requires a small dose of a harmless radioactive material to be injected into a vein. When this material combines with certain substances in the body, it produces tiny particles that can be detected by a scanner and converted into pictures.  The pictures created during a PET scan can be used to study a disease. They are often used to study cancer and cancer therapy. The colors and brightness on the pictures show different levels of organ and tissue function. For example, cancer tissue appears brighter than normal tissue on a PET scan picture. LET YOUR HEALTH CARE PROVIDER KNOW ABOUT:   Any allergies you have.  All medicines you are taking, including vitamins, herbs, eye drops, creams, and over-the-counter medicines.  Previous problems you or members of your family have had with the use of anesthetics.  Any blood disorders you have.  Previous surgeries you have had.  Medical conditions you have.  If you are afraid of cramped spaces (claustrophobic). If claustrophobia is a problem, it usually can be relieved with mild sedatives or antianxiety medicines.  If you have trouble staying still for long periods of time. BEFORE THE PROCEDURE   Do not eat or drink anything after midnight on the night before the procedure or as directed by your health care provider.  Take medicines only as directed by your health care provider.  If you have diabetes, ask your health care provider for diet guidelines to control sugar (glucose) levels on the day of the test. PROCEDURE   A small amount of radioactive material will be injected into a vein. The test will begin 30-60 minutes after the injection, when the material has traveled around your body.  You will lie on a cushioned table, and the table will be moved through the center of a machine that looks like a large donut. It  will take about 30-60 minutes for the machine to produce pictures of your body. You will need to stay still during this time. AFTER THE PROCEDURE  You may resume your normal diet and activities.  Drink several 8 oz glasses of water following the test to flush the radioactive material out of your body.   This information is not intended to replace advice given to you by your health care provider. Make sure you discuss any questions you have with your health care provider.   Document Released: 10/13/2002 Document Revised: 04/29/2014 Document Reviewed: 07/21/2013 Elsevier Interactive Patient Education 2016 [employer]  

## 2015-02-22 NOTE — Progress Notes (Signed)
Pt appetite good, doing ok.  Has abdominal discomfort. Takes tylenol 2 in am and 2 in pm and it helps.

## 2015-02-23 ENCOUNTER — Ambulatory Visit: Payer: Commercial Managed Care - HMO | Admitting: Anesthesiology

## 2015-02-23 ENCOUNTER — Ambulatory Visit: Payer: Commercial Managed Care - HMO | Admitting: Gastroenterology

## 2015-02-23 ENCOUNTER — Encounter: Payer: Self-pay | Admitting: *Deleted

## 2015-02-23 ENCOUNTER — Encounter
Admission: RE | Disposition: A | Discharge status: Home / Self Care | Payer: Self-pay | Source: Ambulatory Visit | Attending: Internal Medicine

## 2015-02-23 ENCOUNTER — Ambulatory Visit (HOSPITAL_BASED_OUTPATIENT_CLINIC_OR_DEPARTMENT_OTHER)
Admission: RE | Admit: 2015-02-23 | Discharge: 2015-02-23 | Disposition: A | Discharge status: Home / Self Care | Payer: Commercial Managed Care - HMO | Source: Ambulatory Visit | Attending: Internal Medicine | Admitting: Internal Medicine

## 2015-02-23 DIAGNOSIS — E1140 Type 2 diabetes mellitus with diabetic neuropathy, unspecified: Secondary | ICD-10-CM | POA: Diagnosis present

## 2015-02-23 DIAGNOSIS — Z79899 Other long term (current) drug therapy: Secondary | ICD-10-CM

## 2015-02-23 DIAGNOSIS — M1990 Unspecified osteoarthritis, unspecified site: Secondary | ICD-10-CM | POA: Diagnosis present

## 2015-02-23 DIAGNOSIS — J45909 Unspecified asthma, uncomplicated: Secondary | ICD-10-CM | POA: Diagnosis present

## 2015-02-23 DIAGNOSIS — Z955 Presence of coronary angioplasty implant and graft: Secondary | ICD-10-CM

## 2015-02-23 DIAGNOSIS — Z951 Presence of aortocoronary bypass graft: Secondary | ICD-10-CM

## 2015-02-23 DIAGNOSIS — I252 Old myocardial infarction: Secondary | ICD-10-CM

## 2015-02-23 DIAGNOSIS — H269 Unspecified cataract: Secondary | ICD-10-CM | POA: Diagnosis present

## 2015-02-23 DIAGNOSIS — C250 Malignant neoplasm of head of pancreas: Secondary | ICD-10-CM

## 2015-02-23 DIAGNOSIS — E785 Hyperlipidemia, unspecified: Secondary | ICD-10-CM | POA: Diagnosis present

## 2015-02-23 DIAGNOSIS — F419 Anxiety disorder, unspecified: Secondary | ICD-10-CM | POA: Diagnosis present

## 2015-02-23 DIAGNOSIS — G4730 Sleep apnea, unspecified: Secondary | ICD-10-CM | POA: Diagnosis present

## 2015-02-23 DIAGNOSIS — A419 Sepsis, unspecified organism: Secondary | ICD-10-CM | POA: Diagnosis not present

## 2015-02-23 DIAGNOSIS — Z87891 Personal history of nicotine dependence: Secondary | ICD-10-CM

## 2015-02-23 DIAGNOSIS — D649 Anemia, unspecified: Secondary | ICD-10-CM | POA: Diagnosis present

## 2015-02-23 DIAGNOSIS — N179 Acute kidney failure, unspecified: Secondary | ICD-10-CM | POA: Diagnosis present

## 2015-02-23 DIAGNOSIS — A4150 Gram-negative sepsis, unspecified: Secondary | ICD-10-CM | POA: Diagnosis not present

## 2015-02-23 DIAGNOSIS — Z794 Long term (current) use of insulin: Secondary | ICD-10-CM

## 2015-02-23 DIAGNOSIS — I509 Heart failure, unspecified: Secondary | ICD-10-CM | POA: Diagnosis present

## 2015-02-23 DIAGNOSIS — N400 Enlarged prostate without lower urinary tract symptoms: Secondary | ICD-10-CM | POA: Diagnosis present

## 2015-02-23 DIAGNOSIS — R0989 Other specified symptoms and signs involving the circulatory and respiratory systems: Secondary | ICD-10-CM | POA: Diagnosis present

## 2015-02-23 DIAGNOSIS — Z85828 Personal history of other malignant neoplasm of skin: Secondary | ICD-10-CM

## 2015-02-23 DIAGNOSIS — I248 Other forms of acute ischemic heart disease: Secondary | ICD-10-CM | POA: Diagnosis present

## 2015-02-23 DIAGNOSIS — I2510 Atherosclerotic heart disease of native coronary artery without angina pectoris: Secondary | ICD-10-CM | POA: Diagnosis present

## 2015-02-23 DIAGNOSIS — M6281 Muscle weakness (generalized): Secondary | ICD-10-CM | POA: Diagnosis present

## 2015-02-23 DIAGNOSIS — C259 Malignant neoplasm of pancreas, unspecified: Secondary | ICD-10-CM | POA: Diagnosis present

## 2015-02-23 DIAGNOSIS — N189 Chronic kidney disease, unspecified: Secondary | ICD-10-CM | POA: Diagnosis present

## 2015-02-23 DIAGNOSIS — Z7982 Long term (current) use of aspirin: Secondary | ICD-10-CM

## 2015-02-23 DIAGNOSIS — K219 Gastro-esophageal reflux disease without esophagitis: Secondary | ICD-10-CM | POA: Diagnosis present

## 2015-02-23 HISTORY — PX: EUS: SHX5427

## 2015-02-23 LAB — GLUCOSE, CAPILLARY: GLUCOSE-CAPILLARY: 139 mg/dL — AB (ref 65–99)

## 2015-02-23 SURGERY — UPPER ENDOSCOPIC ULTRASOUND (EUS) LINEAR
Anesthesia: General

## 2015-02-23 MED ORDER — MORPHINE SULFATE (PF) 4 MG/ML IV SOLN
2.0000 mg | INTRAVENOUS | Status: AC
  Administered 2015-02-23: 2 mg via INTRAVENOUS

## 2015-02-23 MED ORDER — PROPOFOL 500 MG/50ML IV EMUL
INTRAVENOUS | Status: DC | PRN
  Administered 2015-02-23: 100 ug/kg/min via INTRAVENOUS

## 2015-02-23 MED ORDER — FENTANYL CITRATE (PF) 100 MCG/2ML IJ SOLN
INTRAMUSCULAR | Status: DC | PRN
  Administered 2015-02-23: 50 ug via INTRAVENOUS

## 2015-02-23 MED ORDER — SODIUM CHLORIDE 0.9 % IV SOLN
INTRAVENOUS | Status: DC
  Administered 2015-02-23: 08:00:00 via INTRAVENOUS

## 2015-02-23 MED ORDER — FENTANYL CITRATE (PF) 100 MCG/2ML IJ SOLN: 25.0000 ug | INTRAMUSCULAR | Status: DC | PRN

## 2015-02-23 MED ORDER — LIDOCAINE HCL (CARDIAC) 20 MG/ML IV SOLN
INTRAVENOUS | Status: DC | PRN
  Administered 2015-02-23: 100 mg via INTRAVENOUS

## 2015-02-23 MED ORDER — PROPOFOL 10 MG/ML IV BOLUS
INTRAVENOUS | Status: DC | PRN
  Administered 2015-02-23: 40 mg via INTRAVENOUS
  Administered 2015-02-23: 10 mg via INTRAVENOUS

## 2015-02-23 MED ORDER — ONDANSETRON HCL 4 MG/2ML IJ SOLN: 4.0000 mg | Freq: Once | INTRAMUSCULAR | Status: DC | PRN

## 2015-02-23 MED ORDER — MORPHINE SULFATE (PF) 4 MG/ML IV SOLN
2.0000 mg | INTRAVENOUS | Status: AC
  Administered 2015-02-23: 2 mg via INTRAVENOUS
  Filled 2015-02-23: qty 0.5

## 2015-02-23 MED ORDER — PHENYLEPHRINE HCL 10 MG/ML IJ SOLN
INTRAMUSCULAR | Status: DC | PRN
  Administered 2015-02-23 (×2): 50 ug via INTRAVENOUS

## 2015-02-23 NOTE — Anesthesia Preprocedure Evaluation (Signed)
Anesthesia Evaluation  Patient identified by MRN, date of birth, ID band  Reviewed: Allergy & Precautions, NPO status , Patient's Chart, lab work & pertinent test results  Airway Mallampati: II  TM Distance: >3 FB Neck ROM: Full    Dental  (+) Upper Dentures, Lower Dentures   Pulmonary asthma , sleep apnea , former smoker,    Pulmonary exam normal breath sounds clear to auscultation       Cardiovascular + angina with exertion + CAD, + Past MI and +CHF  Normal cardiovascular exam     Neuro/Psych Anxiety  Neuromuscular disease    GI/Hepatic GERD  ,  Endo/Other  diabetes, Well Controlled, Type 2Pancreatic CA  Renal/GU Renal stone     Musculoskeletal  (+) Arthritis , Osteoarthritis,    Abdominal Normal abdominal exam  (+)   Peds negative pediatric ROS (+)  Hematology  (+) anemia ,   Anesthesia Other Findings Pancreatic CA  Reproductive/Obstetrics                             Anesthesia Physical Anesthesia Plan  ASA: III  Anesthesia Plan: General   Post-op Pain Management:    Induction: Intravenous  Airway Management Planned: Nasal Cannula  Additional Equipment:   Intra-op Plan:   Post-operative Plan:   Informed Consent: I have reviewed the patients History and Physical, chart, labs and discussed the procedure including the risks, benefits and alternatives for the proposed anesthesia with the patient or authorized representative who has indicated his/her understanding and acceptance.   Dental advisory given  Plan Discussed with: CRNA and Surgeon  Anesthesia Plan Comments:         Anesthesia Quick Evaluation

## 2015-02-23 NOTE — Op Note (Signed)
[facility] Gastroenterology Patient Name: [name] Procedure Date: 02/23/2015 8:42 AM MRN: [id number] Account #: [id number] Date of Birth: [birth year] Admit Type: Outpatient Age: 74 Room: [facility] ENDO ROOM 3 Gender: Male Note Status: Finalized Procedure:         Upper EUS Indications:       Common bile duct dilation (acquired) seen on CT scan,                     Obstruction of bile duct on ERCP, Weight loss Patient Profile:   Refer to note in patient chart for documentation of                     history and physical. Providers:         Murray Hodgkins. Drakkar Medeiros Referring MD:      Lucilla Lame, MD (Referring MD), Rusty Aus, MD                     (Referring MD) Medicines:         Propofol per Anesthesia Complications:     No immediate complications. Procedure:         Pre-Anesthesia Assessment:                    - Prior to the procedure, a History and Physical was                     performed, and patient medications and allergies were                     reviewed. The patient is competent. The risks and benefits                     of the procedure and the sedation options and risks were                     discussed with the patient. All questions were answered                     and informed consent was obtained. Patient identification                     and proposed procedure were verified by the physician, the                     nurse and the anesthesiologist in the pre-procedure area.                     Mental Status Examination: alert and oriented. Airway                     Examination: normal oropharyngeal airway and neck                     mobility. Respiratory Examination: clear to auscultation.                     CV Examination: normal. Prophylactic Antibiotics: The                     patient does not require prophylactic antibiotics. Prior                     Anticoagulants: The patient has taken no previous   anticoagulant or  antiplatelet agents. ASA Grade                     Assessment: III - A patient with severe systemic disease.                     After reviewing the risks and benefits, the patient was                     deemed in satisfactory condition to undergo the procedure.                     The anesthesia plan was to use monitored anesthesia care                     (MAC). Immediately prior to administration of medications,                     the patient was re-assessed for adequacy to receive                     sedatives. The heart rate, respiratory rate, oxygen                     saturations, blood pressure, adequacy of pulmonary                     ventilation, and response to care were monitored                     throughout the procedure. The physical status of the                     patient was re-assessed after the procedure.                    After obtaining informed consent, the endoscope was passed                     under direct vision. Throughout the procedure, the                     patient's blood pressure, pulse, and oxygen saturations                     were monitored continuously. The EUS GI Linear Array                     [id number] was introduced through the mouth, and advanced to                     the duodenum for ultrasound examination from the                     esophagus, stomach and duodenum. The Endoscope was                     introduced through the mouth, and advanced to the second                     part of duodenum. The upper EUS was accomplished without                     difficulty. The patient tolerated the procedure well. Findings:      Endoscopic Finding :      The  examined esophagus was endoscopically normal.      The entire examined stomach was endoscopically normal.      A biliary stent was seen in the ampulla.      The exam of the duodenum was otherwise normal.      Endosonographic Finding :      An irregular mass was identified  in the pancreatic head. The mass was       hypoechoic. The mass measured 22.3 mm by 21.7 mm in maximal       cross-sectional diameter. The outer margins were irregular. There was       sonographic evidence suggesting invasion into the portal vein       (manifested by abutment) and the superior mesenteric vein (manifested by       abutment). Fine needle biopsy was performed. Color Doppler imaging was       utilized prior to needle puncture to confirm a lack of significant       vascular structures within the needle path. Four passes were made with       the 22 gauge Medtronic Sharkcore ultrasound biopsy needle using a       transduodenal approach. The cellularity of the specimen was adequate.       Final cytology results are pending.      An anechoic lesion suggestive of a cyst was identified in the pancreatic       body. The lesion measured 7.0 mm by 7.4 mm in maximal cross-sectional       diameter. There was a single compartment without septae. The outer wall       of the lesion was not seen. There was no associated mass. There was no       internal debris within the fluid-filled cavity.      The main pancreatic duct had a dilated endosonographic appearance with       prominent side branches throughout the entire pancreas. The pancreatic       duct measured 9.4 mm in the head, 4.6 mm in the neck, 3.3 mm in the       body, and 2.8 mm in the tail.      One stent was visualized endosonographically in the common bile duct.       Extension of the stent was noted in the common hepatic duct.      Endosonographic imaging in the left lobe of the liver showed no       abnormalities.      No lymphadenopathy seen.      The celiac region was visualized and showed no sign of significant       endosonographic abnormality. Impression:        EGD Impressions:                    - Normal esophagus.                    - Normal stomach.                    - Biliary stent emerging from the major ampulla.                     - Otherwise normal examined duodenum.                    EUS Impressions:                    -  A mass was identified in the pancreatic head. The                     endosonographic appearance is suspicious for                     adenocarcinoma. This was staged T3 N0 Mx by                     endosonographic criteria. The staging applies if                     malignancy is confirmed. Fine needle biopsy performed.                    - A cystic lesion was seen in the pancreatic body.                    - The main pancreatic duct had a dilated endosonographic                     appearance with prominent side branches throughout the                     pancreas.                    - One stent was visualized endosonographically in the                     common bile duct.                    - Normal visualized portions of the liver.                    - Normal celiac region. Recommendation:    - Discharge patient to home (ambulatory).                    - Await cytology results.                    - Agree with medical/radiation oncology and surgical                     evaluation given concern for malignancy.                    - The findings and recommendations were discussed with the                     patient and his family.                    - Return to referring physician as previously scheduled. Procedure Code(s): --- Professional ---                    [phone], Esophagogastroduodenoscopy, flexible, transoral;                     with transendoscopic ultrasound-guided intramural or                     transmural fine needle aspiration/biopsy(s) (includes                     endoscopic ultrasound examination of the esophagus,  stomach, and either the duodenum or a surgically altered                     stomach where the jejunum is examined distal to the                     anastomosis) Diagnosis Code(s): --- Professional ---                     K83.8, Other specified diseases of biliary tract                    K83.1, Obstruction of bile duct                    R63.4, Abnormal weight loss                    R93.3, Abnormal findings on diagnostic imaging of other                     parts of digestive tract                    K86.2, Cyst of pancreas                    K86.8, Other specified diseases of pancreas CPT copyright 2014 American Medical Association. All rights reserved. The codes documented in this report are preliminary and upon coder review may  be revised to meet current compliance requirements. Attending Participation:      I personally performed the entire procedure without the assistance of a       fellow, resident or surgical assistant. [place],  02/23/2015 9:22:16 AM This report has been signed electronically. Number of Addenda: 0 Note Initiated On: 02/23/2015 8:42 AM      [facility]

## 2015-02-23 NOTE — Transfer of Care (Signed)
Immediate Anesthesia Transfer of Care Note  Patient: [name]  Procedure(s) Performed: Procedure(s): UPPER ENDOSCOPIC ULTRASOUND (EUS) LINEAR (N/A)  Patient Location: PACU  Anesthesia Type:General  Level of Consciousness: sedated  Airway & Oxygen Therapy: Patient Spontanous Breathing and Patient connected to nasal cannula oxygen  Post-op Assessment: Report given to RN and Post -op Vital signs reviewed and stable  Post vital signs: Reviewed and stable  Last Vitals:  Filed Vitals:   02/23/15 0924  BP:   Pulse: 63  Temp: 35.7 C  Resp: 14  BP 254/98  Complications: No apparent anesthesia complications

## 2015-02-23 NOTE — H&P (View-Only) (Signed)
[facility] CONSULT NOTE  Patient Care Team: Rusty Aus, MD as PCP - General (Unknown Physician Specialty) Clent Jacks, RN as Registered Nurse  CHIEF COMPLAINTS/PURPOSE OF CONSULTATION:  # OCT 2016-? Pancreatic ca; Ca- 19-9- 4003; CT A/P- no evidence of Pancreatic mass; Awaiting EUS on Nov 3rd  # OBSTRUCTIVE JAUNDICE s/p ERCP [s/p stenting; Dr.Wohl; Dr.Rein]  HISTORY OF PRESENTING ILLNESS:  [name] 74 y.o.  male was recently seen in the hospital for painless obstructive jaundice. Patient has noted above had ERCP with stenting with improvement of his jaundice.  Patient states that he is 80% better; close to baseline. He complains of intermittent abdominal discomfort which is not very intense. Appetite is good. No weight loss. Denies any nausea vomiting or chest pain or shortness of breath. He still feels mildly fatigued. He has mild swelling in the legs.  ROS: A complete 10 point review of system is done which is negative except mentioned above in history of present illness  MEDICAL HISTORY:  Past Medical History  Diagnosis Date  . Hyperlipidemia   . H/O cardiac catheterization 06/03/06,12/03/11  . BPH (benign prostatic hypertrophy)   . Anxiety   . Syncope     LAUGHING INDUCED  . Myocardial infarction ([place])     x 2  . CHF (congestive heart failure) ([place])   . CAD (coronary artery disease) 06/03/06    cypher 2.5 x 22m DES for 80% mid RCA sees Dr. FUbaldo Glassing . Asthma     as a child  . Sleep apnea     hx of  . Kidney stone     hx of  . GERD (gastroesophageal reflux disease)     on medication for  . 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 Arthritis   . Anemia     taking iron  . Cataract, bilateral   . Neuromuscular disorder ([place]     diabetic neuropathy  . Diabetes mellitus without complication ([place]     SURGICAL HISTORY: Past Surgical History  Procedure Laterality Date  . Spine surgery  9/05    CERVICAL LAMINECTOMY  .  Cervical herniated    . Eye surgery      as a child  . Coronary artery bypass graft  12/12/2011    Procedure: CORONARY ARTERY BYPASS GRAFTING (CABG);  Surgeon: BGaye Pollack MD;  [facility]  Service: Open Heart Surgery;  Laterality: N/A;  coronary artery bypass graft times four using left internal mammary artery, right leg greater saphenous vein harvested endoscopically  . Cardiac catheterization N/A 10/26/2014    Procedure: Left Heart Cath;  Surgeon: AIsaias Cowman MD;  [facility];  Service: Cardiovascular;  Laterality: N/A;  . Endoscopic retrograde cholangiopancreatography (ercp) with propofol N/A 02/13/2015    Procedure: ENDOSCOPIC RETROGRADE CHOLANGIOPANCREATOGRAPHY (ERCP) WITH PROPOFOL;  Surgeon: DLucilla Lame MD;  Location: [facility] ENDOSCOPY;  Service: Endoscopy;  Laterality: N/A;    SOCIAL HISTORY: Social History   Social History  . Marital Status: Married    Spouse Name: N/A  . Number of Children: N/A  . Years of Education: N/A   Occupational History  . Not on file.   Social History Main Topics  . Smoking status: Former Smoker    Types: Cigarettes, Cigars    Quit date: 12/03/1977  . Smokeless tobacco: Never Used  . Alcohol Use: No  . Drug Use: No  . Sexual Activity: Not  on file   Other Topics Concern  . Not on file   Social History Narrative   Lives with his wife    FAMILY HISTORY: Family History  Problem Relation Age of Onset  . Diabetes Mellitus II Mother     Also Brother and sister  . Pancreatic cancer Mother   . Leukemia Father   . Diabetes Mellitus II Sister   . Diabetes Mellitus II Brother     ALLERGIES:  is allergic to zithromax and lisinopril.  MEDICATIONS:  Current Outpatient Prescriptions  Medication Sig Dispense Refill  . acetaminophen (TYLENOL) 325 MG tablet Take 650 mg by mouth 2 (two) times daily as needed.    Marland Kitchen aspirin EC 81 MG tablet Take 81 mg by mouth daily.    Marland Kitchen gabapentin (NEURONTIN) 300 MG capsule Take  300 mg by mouth 2 (two) times daily.    Marland Kitchen glimepiride (AMARYL) 4 MG tablet Take 4 mg by mouth daily.    . insulin glargine (LANTUS) 100 UNIT/ML injection Inject 40 Units into the skin 2 (two) times daily.    . insulin regular (NOVOLIN R,HUMULIN R) 100 units/mL injection Inject 15-20 Units into the skin 3 (three) times daily before meals. Pt uses as needed per sliding scale.    . lovastatin (MEVACOR) 20 MG tablet Take 20 mg by mouth at bedtime.    Marland Kitchen omeprazole (PRILOSEC) 20 MG capsule Take 20 mg by mouth 2 (two) times daily.    Marland Kitchen torsemide (DEMADEX) 20 MG tablet Take 20 mg by mouth 2 (two) times daily.      No current facility-administered medications for this visit.      Marland Kitchen  PHYSICAL EXAMINATION: ECOG PERFORMANCE STATUS: 1 - Symptomatic but completely ambulatory  Filed Vitals:   02/22/15 1347  BP: 158/73  Pulse: 71  Temp: 97.2 F (36.2 C)  Resp: 18   Filed Weights   02/22/15 1347  Weight: 208 lb 5.4 oz (94.5 kg)    GENERAL: Well-nourished well-developed; Alert, no distress and comfortable.   Accompanied by wife/daughter. EYES: Positive for icterus OROPHARYNX: no thrush or ulceration; poor dentition NECK: supple, no masses felt LYMPH:  no palpable lymphadenopathy in the cervical, axillary or inguinal regions LUNGS: clear to auscultation and  No wheeze or crackles HEART/CVS: regular rate & rhythm and no murmurs; 1+ bilateral lower extremity edema ABDOMEN: abdomen soft, non-tender and normal bowel sounds Musculoskeletal:no cyanosis of digits and no clubbing  PSYCH: alert & oriented x 3 with fluent speech NEURO: no focal motor/sensory deficits SKIN:  no rashes or significant lesions  LABORATORY DATA:  I have reviewed the data as listed Lab Results  Component Value Date   WBC 9.1 02/22/2015   HGB 10.5* 02/22/2015   HCT 33.0* 02/22/2015   MCV 85.4 02/22/2015   PLT 278 02/22/2015    Recent Labs  02/13/15 0507 02/14/15 0646 02/22/15 1255  NA 137 138 135  K 3.3* 3.5  3.9  CL 107 111 102  CO2 _0 GLUCOSE 258* 150* 217*  BUN 16 15 23*  CREATININE 0.90 0.96 1.65*  CALCIUM 8.5* 8.2* 7.7*  GFRNONAA >60 >60 39*  GFRAA >60 >60 46*  PROT 6.2* 5.0* 7.2  ALBUMIN 2.3* 1.9* 3.0*  AST 235* 152* 41  ALT 270* 212* 43  ALKPHOS 393* 348* 197*  BILITOT 13.6* 8.9* 3.1*    RADIOGRAPHIC STUDIES: I have personally reviewed the radiological images as listed and agreed with the findings in the report. Ct Abdomen Wo Contrast  02/10/2015  CLINICAL DATA:  Elevated LFTs and bilirubin, jaundice EXAM: CT ABDOMEN WITHOUT CONTRAST TECHNIQUE: Multidetector CT imaging of the abdomen was performed following the standard protocol without IV contrast. Sagittal and coronal MPR images reconstructed from axial data set. Patient drank dilute oral contrast for exam. COMPARISON:  None. FINDINGS: Minimal atelectasis dependently LEFT lower lobe. Liver, spleen, kidneys and adrenal glands unremarkable for noncontrast technique. Normal appendix. Heterogeneous region of low attenuation at the pancreatic body extending into the proximal tail could represent focal pancreatitis though a more focal area of low attenuation 21 x 17 mm is identified, cannot exclude tumor. No definite biliary dilatation, CBD 12 mm diameter. No definite biliary tract calcifications. Tiny hiatal hernia. Visualized bowel loops unremarkable. Scattered atherosclerotic calcifications. No additional mass, adenopathy free air or free fluid. IMPRESSION: Abnormal appearance to the pancreatic body which could be related to focal pancreatitis though mass/ tumor not excluded ; MR imaging recommended to exclude pancreatic neoplasm. Mild biliary dilatation, can evaluate by MRCP sequence at time of MR imaging. Electronically Signed   By: Lavonia Dana M.D.   On: 02/10/2015 16:26   Ct Head Wo Contrast  02/10/2015  CLINICAL DATA:  Altered mental status EXAM: CT HEAD WITHOUT CONTRAST TECHNIQUE: Contiguous axial images were obtained from the  base of the skull through the vertex without intravenous contrast. COMPARISON:  None. FINDINGS: Motion degraded images. No evidence of parenchymal hemorrhage or extra-axial fluid collection. No mass lesion, mass effect, or midline shift. No CT evidence of acute infarction. Mild cortical atrophy.  No ventriculomegaly. The visualized paranasal sinuses are essentially clear. The mastoid air cells are unopacified. No evidence of calvarial fracture. IMPRESSION: Motion degraded images. No evidence of acute intracranial abnormality. Mild cortical atrophy. Electronically Signed   By: Julian Hy M.D.   On: 02/10/2015 20:27   Ct Abdomen Pelvis W Contrast  02/14/2015  CLINICAL DATA:  Diabetic with weakness and elevated bilirubin. Abnormal pancreas on recent noncontrast CT. EXAM: CT ABDOMEN AND PELVIS WITH CONTRAST TECHNIQUE: Multidetector CT imaging of the abdomen and pelvis was performed using the standard protocol following bolus administration of intravenous contrast. CONTRAST:  146m OMNIPAQUE IOHEXOL 300 MG/ML  SOLN COMPARISON:  Noncontrast abdominal CT 02/10/2015. FINDINGS: Lower chest: Mild emphysematous changes and subpleural reticulation are noted at both lung bases. The heart is enlarged status post median sternotomy. There is diffuse atherosclerosis of the coronary arteries and thoracic aorta. There are trace left-greater-than-right pleural effusions. Hepatobiliary: The liver demonstrates mildly decreased density consistent with steatosis. No focal hepatic abnormalities are identified. There is pneumobilia status post interval plastic biliary stent placement. The stent appears well positioned. The gallbladder is decompressed. Pancreas: The pancreatic duct is mildly dilated to 8 mm. There is mild soft tissue stranding around the pancreas suspicious for inflammation. Low-density lesions within the pancreatic head are again noted, measuring 13 mm on image 32 and 20 mm on image 35. These are similar to the  recent noncontrast study. There is also some low density in the pancreatic tail, best seen on the reformatted images. No enhancing parenchymal lesion identified. Spleen: Normal in size without focal abnormality. Adrenals/Urinary Tract: Both adrenal glands appear normal. There is no evidence of urinary tract calculus, hydronephrosis or renal mass. The right kidney demonstrates mild cortical thinning and a small cyst in its interpolar region. The left kidney appears normal. No bladder abnormality seen. Stomach/Bowel: No evidence of bowel wall thickening, distention or surrounding inflammatory change. The appendix appears normal. Vascular/Lymphatic: Prominent lymph nodes in the upper  abdomen are likely reactive, including a 15 mm node in the gastrohepatic ligament on image 27 and a 12 mm portacaval node on image 34. There is no retroperitoneal lymphadenopathy. There is moderate atherosclerosis of the aorta, its branches and the iliac arteries. Reproductive: Mild enlargement of the prostate gland. Other: There is a small amount of ascites with retroperitoneal edema tracking along the pararenal spaces bilaterally. There is asymmetric fat in the left inguinal canal. Musculoskeletal: No acute or significant osseous findings. IMPRESSION: 1. Interval biliary stent placement with resulting pneumobilia and decompression of the biliary system and gallbladder. 2. Nonspecific low-density lesions within the pancreatic head and tail with persistent mild pancreatic ductal dilatation. Findings are favored to reflect acute pancreatitis with small pseudocysts, although cystic neoplasm cannot be excluded by this examination. Correlation with prior ERCP results recommended. 3. Imaging follow-up after the presumed acute inflammatory changes have resolved is recommended (2-4 months, preferably with MRI). Electronically Signed   By: Richardean Sale M.D.   On: 02/14/2015 16:39   US Carotid Bilateral  02/10/2015  CLINICAL DATA:  Syncope  and history of diabetes, coronary artery disease and prior CABG. EXAM: BILATERAL CAROTID DUPLEX ULTRASOUND TECHNIQUE: Pearline Cables scale imaging, color Doppler and duplex ultrasound were performed of bilateral carotid and vertebral arteries in the neck. COMPARISON:  None. FINDINGS: Criteria: Quantification of carotid stenosis is based on velocity parameters that correlate the residual internal carotid diameter with NASCET-based stenosis levels, using the diameter of the distal internal carotid lumen as the denominator for stenosis measurement. The following velocity measurements were obtained: RIGHT ICA:  73/12 cm/sec CCA:  25/0 cm/sec SYSTOLIC ICA/CCA RATIO:  1.1 DIASTOLIC ICA/CCA RATIO:  1.3 ECA:  164 cm/sec LEFT ICA:  105/18 cm/sec CCA:  53/97 cm/sec SYSTOLIC ICA/CCA RATIO:  1.2 DIASTOLIC ICA/CCA RATIO:  1.0 ECA:  147 cm/sec RIGHT CAROTID ARTERY: Mild amount of calcified plaque is present at the level of the distal bulb and proximal ICA. Velocities and waveforms are normal and estimated right ICA stenosis is less than 50%. RIGHT VERTEBRAL ARTERY: Antegrade flow with normal waveform and velocity. LEFT CAROTID ARTERY: There is a mild amount of calcified plaque at the level of the left carotid bulb and proximal ICA. Overall plaque volume is slightly greater on the left compared to the right. Velocities and waveforms are normal and estimated left ICA stenosis is less than 50%. LEFT VERTEBRAL ARTERY: Antegrade flow with normal waveform and velocity. IMPRESSION: Mild amount of plaque at the level of both carotid bulbs and proximal internal carotid arteries, left greater than right. No significant carotid stenosis is identified with estimated bilateral ICA stenoses of less than 50%. Electronically Signed   By: Aletta Edouard M.D.   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 Cytology from ERCP  unavailable. Awaiting EUS tomorrow. I recommend a PET scan for further evaluation. I recommend repeating CA-19-9 at next visit.  # Obstructive jaundice likely secondary to pancreatic malignancy; status post ERCP. Bilirubin down from 15 to 3 today. Repeat LFTs at next visit.  Patient was given a copy of his labs/CT scan. Written instructions were given.  All questions were answered. The patient knows to call the clinic with any problems, questions or concerns.  # I will see the patient back in approximately 1 week; with labs LFTs CA-19-9 ordered. I had spoken to Dr.Wohl; and Dr. Rayann Heman regarding  the patient. Drue Dun; nurse navigator also met with the patient today.   I spent 25 minutes counseling the patient face to face. The total time spent in the appointment was 30 minutes and more than 50% was on counseling.     Cammie Sickle, MD 02/22/2015 2:17 PM

## 2015-02-23 NOTE — Interval H&P Note (Signed)
History and Physical Interval Note:  02/23/2015 8:32 AM  [name]  has presented today for surgery, with the diagnosis of PANCREATIC CANCER  The various methods of treatment have been discussed with the patient and family. After consideration of risks, benefits and other options for treatment, the patient has consented to  Procedure(s): UPPER ENDOSCOPIC ULTRASOUND (EUS) LINEAR (N/A) as a surgical intervention .  The patient's history has been reviewed, patient examined, no change in status, stable for surgery.  I have reviewed the patient's chart and labs.  Questions were answered to the patient's satisfaction.     [name]

## 2015-02-23 NOTE — Anesthesia Procedure Notes (Signed)
Performed by: Lexton Hidalgo Pre-anesthesia Checklist: Patient identified, Emergency Drugs available, Suction available, Timeout performed and Patient being monitored Patient Re-evaluated:Patient Re-evaluated prior to inductionOxygen Delivery Method: Nasal cannula Intubation Type: IV induction       

## 2015-02-24 LAB — CYTOLOGY - NON PAP

## 2015-02-24 NOTE — Anesthesia Postprocedure Evaluation (Signed)
  Anesthesia Post-op Note  Patient: [name]  Procedure(s) Performed: Procedure(s): UPPER ENDOSCOPIC ULTRASOUND (EUS) LINEAR (N/A)  Anesthesia type:General  Patient location: PACU  Post pain: Pain level controlled  Post assessment: Post-op Vital signs reviewed, Patient's Cardiovascular Status Stable, Respiratory Function Stable, Patent Airway and No signs of Nausea or vomiting  Post vital signs: Reviewed and stable  Last Vitals:  Filed Vitals:   02/23/15 1100  BP: 164/73  Pulse: 69  Temp:   Resp: 16    Level of consciousness: awake, alert  and patient cooperative  Complications: No apparent anesthesia complications

## 2015-02-25 ENCOUNTER — Inpatient Hospital Stay
Admission: EM | Admit: 2015-02-25 | Discharge: 2015-03-01 | DRG: 872 | Disposition: A | Discharge status: Home Health | Payer: Commercial Managed Care - HMO | Attending: Internal Medicine | Admitting: Internal Medicine

## 2015-02-25 ENCOUNTER — Encounter: Payer: Self-pay | Admitting: Emergency Medicine

## 2015-02-25 ENCOUNTER — Emergency Department: Payer: Commercial Managed Care - HMO

## 2015-02-25 DIAGNOSIS — N400 Enlarged prostate without lower urinary tract symptoms: Secondary | ICD-10-CM | POA: Diagnosis present

## 2015-02-25 DIAGNOSIS — G4730 Sleep apnea, unspecified: Secondary | ICD-10-CM | POA: Diagnosis present

## 2015-02-25 DIAGNOSIS — A419 Sepsis, unspecified organism: Secondary | ICD-10-CM | POA: Diagnosis present

## 2015-02-25 DIAGNOSIS — Z7982 Long term (current) use of aspirin: Secondary | ICD-10-CM | POA: Diagnosis not present

## 2015-02-25 DIAGNOSIS — I252 Old myocardial infarction: Secondary | ICD-10-CM | POA: Diagnosis not present

## 2015-02-25 DIAGNOSIS — E1140 Type 2 diabetes mellitus with diabetic neuropathy, unspecified: Secondary | ICD-10-CM | POA: Diagnosis present

## 2015-02-25 DIAGNOSIS — Z955 Presence of coronary angioplasty implant and graft: Secondary | ICD-10-CM | POA: Diagnosis not present

## 2015-02-25 DIAGNOSIS — D649 Anemia, unspecified: Secondary | ICD-10-CM | POA: Diagnosis present

## 2015-02-25 DIAGNOSIS — J45909 Unspecified asthma, uncomplicated: Secondary | ICD-10-CM | POA: Diagnosis present

## 2015-02-25 DIAGNOSIS — K219 Gastro-esophageal reflux disease without esophagitis: Secondary | ICD-10-CM | POA: Diagnosis present

## 2015-02-25 DIAGNOSIS — I248 Other forms of acute ischemic heart disease: Secondary | ICD-10-CM | POA: Diagnosis present

## 2015-02-25 DIAGNOSIS — R509 Fever, unspecified: Secondary | ICD-10-CM

## 2015-02-25 DIAGNOSIS — N189 Chronic kidney disease, unspecified: Secondary | ICD-10-CM | POA: Diagnosis present

## 2015-02-25 DIAGNOSIS — M6281 Muscle weakness (generalized): Secondary | ICD-10-CM | POA: Diagnosis present

## 2015-02-25 DIAGNOSIS — Z794 Long term (current) use of insulin: Secondary | ICD-10-CM | POA: Diagnosis not present

## 2015-02-25 DIAGNOSIS — C259 Malignant neoplasm of pancreas, unspecified: Secondary | ICD-10-CM

## 2015-02-25 DIAGNOSIS — R531 Weakness: Secondary | ICD-10-CM | POA: Diagnosis not present

## 2015-02-25 DIAGNOSIS — A4150 Gram-negative sepsis, unspecified: Secondary | ICD-10-CM | POA: Diagnosis present

## 2015-02-25 DIAGNOSIS — R079 Chest pain, unspecified: Secondary | ICD-10-CM

## 2015-02-25 DIAGNOSIS — M1990 Unspecified osteoarthritis, unspecified site: Secondary | ICD-10-CM | POA: Diagnosis present

## 2015-02-25 DIAGNOSIS — I2510 Atherosclerotic heart disease of native coronary artery without angina pectoris: Secondary | ICD-10-CM | POA: Diagnosis present

## 2015-02-25 DIAGNOSIS — I509 Heart failure, unspecified: Secondary | ICD-10-CM | POA: Diagnosis present

## 2015-02-25 DIAGNOSIS — K830 Cholangitis: Secondary | ICD-10-CM | POA: Diagnosis not present

## 2015-02-25 DIAGNOSIS — Z85828 Personal history of other malignant neoplasm of skin: Secondary | ICD-10-CM | POA: Diagnosis not present

## 2015-02-25 DIAGNOSIS — E785 Hyperlipidemia, unspecified: Secondary | ICD-10-CM | POA: Diagnosis present

## 2015-02-25 DIAGNOSIS — Z951 Presence of aortocoronary bypass graft: Secondary | ICD-10-CM | POA: Diagnosis not present

## 2015-02-25 DIAGNOSIS — Z79899 Other long term (current) drug therapy: Secondary | ICD-10-CM | POA: Diagnosis not present

## 2015-02-25 DIAGNOSIS — F419 Anxiety disorder, unspecified: Secondary | ICD-10-CM | POA: Diagnosis present

## 2015-02-25 DIAGNOSIS — Z87891 Personal history of nicotine dependence: Secondary | ICD-10-CM | POA: Diagnosis not present

## 2015-02-25 DIAGNOSIS — H269 Unspecified cataract: Secondary | ICD-10-CM | POA: Diagnosis present

## 2015-02-25 DIAGNOSIS — N179 Acute kidney failure, unspecified: Secondary | ICD-10-CM

## 2015-02-25 DIAGNOSIS — R0989 Other specified symptoms and signs involving the circulatory and respiratory systems: Secondary | ICD-10-CM | POA: Diagnosis present

## 2015-02-25 DIAGNOSIS — C251 Malignant neoplasm of body of pancreas: Secondary | ICD-10-CM

## 2015-02-25 LAB — CBC
HCT: 29.2 % — ABNORMAL LOW (ref 40.0–52.0)
Hemoglobin: 9.3 g/dL — ABNORMAL LOW (ref 13.0–18.0)
MCH: 27.5 pg (ref 26.0–34.0)
MCHC: 31.9 g/dL — AB (ref 32.0–36.0)
MCV: 86.4 fL (ref 80.0–100.0)
PLATELETS: 236 10*3/uL (ref 150–440)
RBC: 3.38 MIL/uL — ABNORMAL LOW (ref 4.40–5.90)
RDW: 18.6 % — AB (ref 11.5–14.5)
WBC: 16.6 10*3/uL — ABNORMAL HIGH (ref 3.8–10.6)

## 2015-02-25 LAB — BASIC METABOLIC PANEL
Anion gap: 7 (ref 5–15)
BUN: 26 mg/dL — AB (ref 6–20)
CHLORIDE: 107 mmol/L (ref 101–111)
CO2: 25 mmol/L (ref 22–32)
CREATININE: 2.02 mg/dL — AB (ref 0.61–1.24)
Calcium: 8 mg/dL — ABNORMAL LOW (ref 8.9–10.3)
GFR calc Af Amer: 36 mL/min — ABNORMAL LOW (ref >60)
GFR calc non Af Amer: 31 mL/min — ABNORMAL LOW (ref >60)
Glucose, Bld: 122 mg/dL — ABNORMAL HIGH (ref 65–99)
Potassium: 3.7 mmol/L (ref 3.5–5.1)
Sodium: 139 mmol/L (ref 135–145)

## 2015-02-25 LAB — URINALYSIS COMPLETE WITH MICROSCOPIC (ARMC ONLY)
Bacteria, UA: NONE SEEN
GLUCOSE, UA: NEGATIVE mg/dL
Hgb urine dipstick: NEGATIVE
Ketones, ur: NEGATIVE mg/dL
Leukocytes, UA: NEGATIVE
Nitrite: NEGATIVE
PH: 5 (ref 5.0–8.0)
Protein, ur: 30 mg/dL — AB
Specific Gravity, Urine: 1.021 (ref 1.005–1.030)

## 2015-02-25 LAB — TROPONIN I
TROPONIN I: 0.06 ng/mL — AB (ref <0.031)
Troponin I: 0.07 ng/mL — ABNORMAL HIGH (ref <0.031)
Troponin I: 0.07 ng/mL — ABNORMAL HIGH (ref <0.031)

## 2015-02-25 LAB — HEPATIC FUNCTION PANEL
ALBUMIN: 2.6 g/dL — AB (ref 3.5–5.0)
ALT: 55 U/L (ref 17–63)
AST: 96 U/L — ABNORMAL HIGH (ref 15–41)
Alkaline Phosphatase: 251 U/L — ABNORMAL HIGH (ref 38–126)
BILIRUBIN TOTAL: 3.4 mg/dL — AB (ref 0.3–1.2)
Bilirubin, Direct: 1.8 mg/dL — ABNORMAL HIGH (ref 0.1–0.5)
Indirect Bilirubin: 1.6 mg/dL — ABNORMAL HIGH (ref 0.3–0.9)
Total Protein: 6.3 g/dL — ABNORMAL LOW (ref 6.5–8.1)

## 2015-02-25 LAB — LACTIC ACID, PLASMA
LACTIC ACID, VENOUS: 2.3 mmol/L — AB (ref 0.5–2.0)
LACTIC ACID, VENOUS: 2 mmol/L (ref 0.5–2.0)

## 2015-02-25 LAB — GLUCOSE, CAPILLARY
Glucose-Capillary: 209 mg/dL — ABNORMAL HIGH (ref 65–99)
Glucose-Capillary: 245 mg/dL — ABNORMAL HIGH (ref 65–99)

## 2015-02-25 LAB — LIPASE, BLOOD: Lipase: 33 U/L (ref 11–51)

## 2015-02-25 MED ORDER — ASPIRIN EC 325 MG PO TBEC
325.0000 mg | DELAYED_RELEASE_TABLET | Freq: Every day | ORAL | Status: DC
  Administered 2015-02-26 – 2015-02-27 (×2): 325 mg via ORAL
  Filled 2015-02-25: qty 1

## 2015-02-25 MED ORDER — SODIUM CHLORIDE 0.9 % IJ SOLN
3.0000 mL | Freq: Two times a day (BID) | INTRAMUSCULAR | Status: DC
  Administered 2015-02-26 – 2015-03-01 (×6): 3 mL via INTRAVENOUS

## 2015-02-25 MED ORDER — PIPERACILLIN-TAZOBACTAM 3.375 G IVPB 30 MIN: 3.3750 g | Freq: Three times a day (TID) | INTRAVENOUS | Status: DC

## 2015-02-25 MED ORDER — ENOXAPARIN SODIUM 40 MG/0.4ML ~~LOC~~ SOLN
40.0000 mg | SUBCUTANEOUS | Status: DC
  Administered 2015-02-25 – 2015-02-26 (×2): 40 mg via SUBCUTANEOUS
  Filled 2015-02-25 (×2): qty 0.4

## 2015-02-25 MED ORDER — TORSEMIDE 20 MG PO TABS
20.0000 mg | ORAL_TABLET | Freq: Every morning | ORAL | Status: DC
  Administered 2015-02-26 – 2015-03-01 (×4): 20 mg via ORAL
  Filled 2015-02-25 (×5): qty 1

## 2015-02-25 MED ORDER — PIPERACILLIN-TAZOBACTAM 3.375 G IVPB 30 MIN
3.3750 g | Freq: Once | INTRAVENOUS | Status: AC
  Administered 2015-02-25: 3.375 g via INTRAVENOUS
  Filled 2015-02-25: qty 50

## 2015-02-25 MED ORDER — PIPERACILLIN-TAZOBACTAM 3.375 G IVPB
3.3750 g | Freq: Three times a day (TID) | INTRAVENOUS | Status: DC
  Administered 2015-02-25 – 2015-02-27 (×6): 3.375 g via INTRAVENOUS
  Filled 2015-02-25 (×7): qty 50

## 2015-02-25 MED ORDER — INSULIN ASPART 100 UNIT/ML ~~LOC~~ SOLN
0.0000 [IU] | Freq: Three times a day (TID) | SUBCUTANEOUS | Status: DC
  Administered 2015-02-25: 3 [IU] via SUBCUTANEOUS
  Administered 2015-02-26: 2 [IU] via SUBCUTANEOUS
  Administered 2015-02-26: 5 [IU] via SUBCUTANEOUS
  Administered 2015-02-26: 2 [IU] via SUBCUTANEOUS
  Administered 2015-02-27: 1 [IU] via SUBCUTANEOUS
  Administered 2015-02-27 (×2): 2 [IU] via SUBCUTANEOUS
  Administered 2015-02-28: 3 [IU] via SUBCUTANEOUS
  Administered 2015-02-28: 5 [IU] via SUBCUTANEOUS
  Administered 2015-02-28: 1 [IU] via SUBCUTANEOUS
  Administered 2015-03-01: 3 [IU] via SUBCUTANEOUS
  Filled 2015-02-25 (×2): qty 2
  Filled 2015-02-25: qty 5
  Filled 2015-02-25 (×3): qty 3
  Filled 2015-02-25: qty 2
  Filled 2015-02-25: qty 1
  Filled 2015-02-25: qty 2

## 2015-02-25 MED ORDER — PRAVASTATIN SODIUM 20 MG PO TABS
20.0000 mg | ORAL_TABLET | Freq: Every day | ORAL | Status: DC
  Administered 2015-02-25 – 2015-02-28 (×4): 20 mg via ORAL
  Filled 2015-02-25 (×4): qty 1

## 2015-02-25 MED ORDER — TORSEMIDE 20 MG PO TABS: 20.0000 mg | ORAL_TABLET | Freq: Two times a day (BID) | ORAL | Status: DC

## 2015-02-25 MED ORDER — VANCOMYCIN HCL 10 G IV SOLR
2000.0000 mg | Freq: Once | INTRAVENOUS | Status: AC
  Administered 2015-02-25: 2000 mg via INTRAVENOUS
  Filled 2015-02-25: qty 2000

## 2015-02-25 MED ORDER — VANCOMYCIN HCL IN DEXTROSE 1-5 GM/200ML-% IV SOLN: 1000.0000 mg | Freq: Once | INTRAVENOUS | Status: DC

## 2015-02-25 MED ORDER — TORSEMIDE 5 MG PO TABS
10.0000 mg | ORAL_TABLET | Freq: Every evening | ORAL | Status: DC
  Administered 2015-02-26 – 2015-02-28 (×3): 10 mg via ORAL
  Filled 2015-02-25 (×3): qty 2

## 2015-02-25 MED ORDER — INSULIN GLARGINE 100 UNIT/ML ~~LOC~~ SOLN
20.0000 [IU] | Freq: Two times a day (BID) | SUBCUTANEOUS | Status: DC
  Administered 2015-02-25 – 2015-02-26 (×2): 20 [IU] via SUBCUTANEOUS
  Filled 2015-02-25 (×4): qty 0.2

## 2015-02-25 MED ORDER — ASPIRIN 81 MG PO CHEW
324.0000 mg | CHEWABLE_TABLET | Freq: Once | ORAL | Status: AC
  Administered 2015-02-25: 324 mg via ORAL
  Filled 2015-02-25: qty 4

## 2015-02-25 MED ORDER — SODIUM CHLORIDE 0.9 % IV SOLN
INTRAVENOUS | Status: AC
  Administered 2015-02-25: 18:00:00 via INTRAVENOUS

## 2015-02-25 MED ORDER — GABAPENTIN 300 MG PO CAPS
300.0000 mg | ORAL_CAPSULE | Freq: Two times a day (BID) | ORAL | Status: DC
  Administered 2015-02-25 – 2015-03-01 (×8): 300 mg via ORAL
  Filled 2015-02-25 (×8): qty 1

## 2015-02-25 MED ORDER — DOCUSATE SODIUM 100 MG PO CAPS
100.0000 mg | ORAL_CAPSULE | Freq: Two times a day (BID) | ORAL | Status: DC
  Administered 2015-02-25 – 2015-03-01 (×8): 100 mg via ORAL
  Filled 2015-02-25 (×8): qty 1

## 2015-02-25 MED ORDER — INSULIN ASPART 100 UNIT/ML ~~LOC~~ SOLN
0.0000 [IU] | Freq: Every day | SUBCUTANEOUS | Status: DC
  Administered 2015-02-25 – 2015-02-28 (×3): 2 [IU] via SUBCUTANEOUS
  Filled 2015-02-25: qty 5
  Filled 2015-02-25: qty 1
  Filled 2015-02-25: qty 2
  Filled 2015-02-25: qty 1
  Filled 2015-02-25: qty 2

## 2015-02-25 MED ORDER — OXYCODONE HCL 5 MG PO TABS
5.0000 mg | ORAL_TABLET | ORAL | Status: DC | PRN
  Administered 2015-02-26 – 2015-02-28 (×8): 5 mg via ORAL
  Filled 2015-02-25 (×8): qty 1

## 2015-02-25 MED ORDER — ACETAMINOPHEN 325 MG PO TABS
650.0000 mg | ORAL_TABLET | Freq: Two times a day (BID) | ORAL | Status: DC | PRN
  Administered 2015-02-26: 650 mg via ORAL
  Filled 2015-02-25: qty 2

## 2015-02-25 MED ORDER — VANCOMYCIN HCL 10 G IV SOLR
1250.0000 mg | INTRAVENOUS | Status: DC
  Administered 2015-02-26: 1250 mg via INTRAVENOUS
  Filled 2015-02-25 (×2): qty 1250

## 2015-02-25 MED ORDER — PANTOPRAZOLE SODIUM 40 MG PO TBEC
40.0000 mg | DELAYED_RELEASE_TABLET | Freq: Every day | ORAL | Status: DC
  Administered 2015-02-25 – 2015-03-01 (×5): 40 mg via ORAL
  Filled 2015-02-25 (×6): qty 1

## 2015-02-25 NOTE — ED Notes (Signed)
Dr. Clearnce Hasten informed of troponin of 0.07

## 2015-02-25 NOTE — ED Provider Notes (Signed)
[facility] Emergency Department Provider Note  ____________________________________________  Time seen: Approximately 1225 PM  I have reviewed the triage vital signs and the nursing notes.   HISTORY  Chief Complaint Weakness    HPI [name] is a 74 y.o. male with a recent diagnosis of pancreatic cancer was presenting today with weakness. He says that this morning he had difficulty standing up because he felt lightheaded. He said he took a Tylenol about 9 AM. Denies any fever or chills. Says that he has had ongoing chest pain over the past several years since he had his bypass surgery. He is complaining of left-sided chest pain that is nonradiating. He denies any shortness of breath.Denies any nausea vomiting or diarrhea. Denies any abdominal pain at this time. Says he is not on chemotherapy or radiation yet because of his recent diagnosis.   Past Medical History  Diagnosis Date  . Hyperlipidemia   . H/O cardiac catheterization 06/03/06,12/03/11  . BPH (benign prostatic hypertrophy)   . Anxiety   . Syncope     LAUGHING INDUCED  . Myocardial infarction ([place])     x 2  . CHF (congestive heart failure) ([place])   . CAD (coronary artery disease) 06/03/06    cypher 2.5 x 66mm DES for 80% mid RCA sees Dr. Ubaldo Glassing  . Asthma     as a child  . Sleep apnea     hx of  . Kidney stone     hx of  . GERD (gastroesophageal reflux disease)     on medication for  . Cancer ([place])     hx of skin ca in "corner of left eye"  . Arthritis   . Anemia     taking iron  . Cataract, bilateral   . Neuromuscular disorder ([place])     diabetic neuropathy  . Diabetes mellitus without complication [facility])     Patient Active Problem List   Diagnosis Date Noted  . Pancreatic cancer ([place]) 02/22/2015  . Pancreas neoplasm   . Acquired hyperbilirubinemia   . Disease of biliary tract   . Obstructive hyperbilirubinemia   . Jaundice   . Obstructive jaundice 02/10/2015  . [facility] 10/26/2014  . 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 Diabetes mellitus   . Hyperlipidemia   . CAD (coronary artery disease)   . Sleep apnea   . Asthma   . BPH (benign prostatic hypertrophy)   . Anxiety   . Syncope     Past Surgical History  Procedure Laterality Date  . Spine surgery  9/05    CERVICAL LAMINECTOMY  . Cervical herniated    . Eye surgery      as a child  . Coronary artery bypass graft  12/12/2011    Procedure: CORONARY ARTERY BYPASS GRAFTING (CABG);  Surgeon: Gaye Pollack, MD;  Location: [place];  Service: Open Heart Surgery;  Laterality: N/A;  coronary artery bypass graft times four using left internal mammary artery, right leg greater saphenous vein harvested endoscopically  . Cardiac catheterization N/A 10/26/2014    Procedure: Left Heart Cath;  Surgeon: Isaias Cowman, MD;  Location: [place] CV LAB;  Service: Cardiovascular;  Laterality: N/A;  . Endoscopic retrograde cholangiopancreatography (ercp) with propofol N/A 02/13/2015    Procedure: ENDOSCOPIC RETROGRADE CHOLANGIOPANCREATOGRAPHY (ERCP) WITH PROPOFOL;  Surgeon: Lucilla Lame, MD;  Location: [facility] ENDOSCOPY;  Service: Endoscopy;  Laterality: N/A;  . Eus N/A 02/23/2015    Procedure: UPPER ENDOSCOPIC ULTRASOUND (EUS) LINEAR;  Surgeon: Holly Bodily, MD;  Location[facility] ENDOSCOPY;  Service: Gastroenterology;  Laterality: N/A;    Current Outpatient Rx  Name  Route  Sig  Dispense  Refill  . acetaminophen (TYLENOL) 325 MG tablet   Oral   Take 650 mg by mouth 2 (two) times daily as needed.         Marland Kitchen aspirin EC 81 MG tablet   Oral   Take 81 mg by mouth daily.         Marland Kitchen gabapentin (NEURONTIN) 300 MG capsule   Oral   Take 300 mg by mouth 2 (two) times daily.         Marland Kitchen glimepiride (AMARYL) 4 MG tablet   Oral   Take 4 mg by mouth daily.         . insulin glargine (LANTUS) 100 UNIT/ML injection   Subcutaneous   Inject 40 Units into the skin 2 (two) times daily.         . insulin regular  (NOVOLIN R,HUMULIN R) 100 units/mL injection   Subcutaneous   Inject 15-20 Units into the skin 3 (three) times daily before meals. Pt uses as needed per sliding scale.         . lovastatin (MEVACOR) 20 MG tablet   Oral   Take 20 mg by mouth at bedtime.         Marland Kitchen omeprazole (PRILOSEC) 20 MG capsule   Oral   Take 20 mg by mouth 2 (two) times daily.         Marland Kitchen torsemide (DEMADEX) 20 MG tablet   Oral   Take 20 mg by mouth 2 (two) times daily.            Allergies Zithromax and Lisinopril  Family History  Problem Relation Age of Onset  . Diabetes Mellitus II Mother     Also Brother and sister  . Pancreatic cancer Mother   . Leukemia Father   . Diabetes Mellitus II Sister   . Diabetes Mellitus II Brother     Social History Social History  Substance Use Topics  . Smoking status: Former Smoker    Types: Cigarettes, Cigars    Quit date: 12/03/1977  . Smokeless tobacco: Never Used  . Alcohol Use: No    Review of Systems Constitutional: No fever/chills Eyes: No visual changes. ENT: No sore throat. Cardiovascular: Denies chest pain. Respiratory: Denies shortness of breath. Gastrointestinal: No abdominal pain.  No nausea, no vomiting.  No diarrhea.  No constipation. Genitourinary: Negative for dysuria. Musculoskeletal: Negative for back pain. Skin: Negative for rash. Neurological: Negative for headaches, focal weakness or numbness.  10-point ROS otherwise negative.  ____________________________________________   PHYSICAL EXAM:  VITAL SIGNS: ED Triage Vitals  Enc Vitals Group     BP 02/25/15 1145 109/55 mmHg     Pulse Rate 02/25/15 1145 85     Resp 02/25/15 1145 14     Temp 02/25/15 1145 100.2 F (37.9 C)     Temp Source 02/25/15 1145 Oral     SpO2 02/25/15 1145 98 %     Weight 02/25/15 1145 207 lb 8 oz (94.121 kg)     Height 02/25/15 1145 5\' 9"  (1.753 m)     Head Cir --      Peak Flow --      Pain Score 02/25/15 1146 3     Pain Loc --      Pain  Edu? --      Excl. in [place]? --  Constitutional: Alert and oriented. Well appearing and in no acute distress. Eyes: Conjunctivae are normal. PERRL. EOMI. Head: Atraumatic. Nose: No congestion/rhinnorhea. Mouth/Throat: Mucous membranes are moist.  Oropharynx non-erythematous. Neck: No stridor.   Cardiovascular: Normal rate, regular rhythm. Grossly normal heart sounds.  Good peripheral circulation. Respiratory: Normal respiratory effort.  No retractions. Lungs CTAB. Gastrointestinal: Soft and nontender. No distention. No abdominal bruits. No CVA tenderness. Musculoskeletal: No lower extremity tenderness nor edema.  No joint effusions. Neurologic:  Normal speech and language. No gross focal neurologic deficits are appreciated. No gait instability. Skin:  Skin is warm, dry and intact. No rash noted. Psychiatric: Mood and affect are normal. Speech and behavior are normal.  ____________________________________________   LABS (all labs ordered are listed, but only abnormal results are displayed)  Labs Reviewed  BASIC METABOLIC PANEL - Abnormal; Notable for the following:    Glucose, Bld 122 (*)    BUN 26 (*)    Creatinine, Ser 2.02 (*)    Calcium 8.0 (*)    GFR calc non Af Amer 31 (*)    GFR calc Af Amer 36 (*)    All other components within normal limits  CBC - Abnormal; Notable for the following:    WBC 16.6 (*)    RBC 3.38 (*)    Hemoglobin 9.3 (*)    HCT 29.2 (*)    MCHC 31.9 (*)    RDW 18.6 (*)    All other components within normal limits  TROPONIN I - Abnormal; Notable for the following:    Troponin I 0.07 (*)    All other components within normal limits  CULTURE, BLOOD (ROUTINE X 2)  CULTURE, BLOOD (ROUTINE X 2)  URINE CULTURE  URINALYSIS COMPLETEWITH MICROSCOPIC ([facility] ONLY)  LACTIC ACID, PLASMA  LACTIC ACID, PLASMA   ____________________________________________  EKG  ED ECG REPORT I, Doran Stabler, the attending physician, personally viewed and  interpreted this ECG.   Date: 02/25/2015  EKG Time: 1156  Rate: 81  Rhythm: normal sinus rhythm  Axis: Borderline left axis.  Intervals:none  ST&T Change: T wave inversions in 1 and aVL which were seen previously on the EKG from 02/10/2015. 1 mm depressions in these leads also appears similar to previous.  ____________________________________________  RADIOLOGY  X-ray with low lung volumes with the appearance of pulmonary vascular congestion earlier edema. CAT scan of the chest without any acute abnormality. ____________________________________________   PROCEDURES  ____________________________________________   INITIAL IMPRESSION / ASSESSMENT AND PLAN / ED COURSE  Pertinent labs & imaging results that were available during my care of the patient were reviewed by me and considered in my medical decision making (see chart for details).  ----------------------------------------- 12:55 PM on 02/25/2015 -----------------------------------------  Family is now the bedside. The patient had an endoscopy this past Tuesday and then began to have chest pain and right ears starting yesterday. I will CAT scan the patient's chest to make sure there is no iatrogenic complication.  ----------------------------------------- 2:04 PM on 02/25/2015 -----------------------------------------  Patient resting of the lid this time. Continues to be without any chest pain. Discussed with him as well as his family need for admission because of the borderline fever after taking Tylenol. The patient is especially high risk visit his recent diagnosis of pancreatic cancer. He is also showing elevated troponin at 0.07. This may be a demand response from early sepsis. We'll give aspirin. Also with worsening renal failure over the past 2 weeks. Signed out to Dr. Margaretmary Eddy.  ____________________________________________   FINAL CLINICAL IMPRESSION(S) / ED DIAGNOSES  Acute renal failure. Fever. Chest  pain.    Orbie Pyo, MD 02/25/15 [phone]

## 2015-02-25 NOTE — ED Notes (Signed)
Patient brought in by EMS c/o Weakness that started last night and has gotten worse. Dizzy when standing, dx with prostate cancer yesterday. Per EMS, CBG 133 BP 113 66 HR 83 O2 96% on Room air, temp 99.5

## 2015-02-25 NOTE — Consult Note (Signed)
ANTIBIOTIC CONSULT NOTE - INITIAL  Pharmacy Consult for vancomycin/zosyn Indication: rule out sepsis  Allergies  Allergen Reactions  . Zithromax [Azithromycin] Anaphylaxis and Other (See Comments)    Reaction:  Stroke-like symptoms   . Lisinopril Other (See Comments)    Per spouse, pt did not tolerate well in the past.      Patient Measurements: Height: 5\' 9"  (175.3 cm) Weight: 207 lb 8 oz (94.121 kg) IBW/kg (Calculated) : 70.7 Adjusted Body Weight: 80kg  Vital Signs: Temp: 100.2 F (37.9 C) (11/05 1145) Temp Source: Oral (11/05 1145) BP: 96/51 mmHg (11/05 1249) Pulse Rate: 85 (11/05 1145) Intake/Output from previous day:   Intake/Output from this shift:    Labs:  Recent Labs  02/25/15 1204  WBC 16.6*  HGB 9.3*  PLT 236  CREATININE 2.02*   Estimated Creatinine Clearance: 36.3 mL/min (by C-G formula based on Cr of 2.02). No results for input(s): VANCOTROUGH, VANCOPEAK, VANCORANDOM, GENTTROUGH, GENTPEAK, GENTRANDOM, TOBRATROUGH, TOBRAPEAK, TOBRARND, AMIKACINPEAK, AMIKACINTROU, AMIKACIN in the last 72 hours.   Microbiology: No results found for this or any previous visit (from the past 720 hour(s)).  Medical History: Past Medical History  Diagnosis Date  . Hyperlipidemia   . H/O cardiac catheterization 06/03/06,12/03/11  . BPH (benign prostatic hypertrophy)   . Anxiety   . Syncope     LAUGHING INDUCED  . Myocardial infarction ([place])     x 2  . CHF (congestive heart failure) ([place])   . CAD (coronary artery disease) 06/03/06    cypher 2.5 x 59mm DES for 80% mid RCA sees Dr. Ubaldo Glassing  . Asthma     as a child  . Sleep apnea     hx of  . Kidney stone     hx of  . GERD (gastroesophageal reflux disease)     on medication for  . 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 Arthritis   . Anemia     taking iron  . Cataract, bilateral   . Neuromuscular disorder ([place])     diabetic neuropathy  . Diabetes mellitus without complication ([facility])      Medications:  Scheduled:   Assessment: Pt is a 74 year old male with weakness, fever, elevated WBC, hypotension. Pharmacy consulted to dose vanc and zosyn for sepsis Ke=0.035 T1/2=19 vd=56  Goal of Therapy:  Vancomycin trough level 15-20 mcg/ml  Plan:  Measure antibiotic drug levels at steady state Follow up culture results Will load with 2g of vancomycin then start 1250mg  q 24 hours. Will give zosyn 3.375g q 8 hours EI infusion.  Will measure vanc trough at steady state, 1110 @ 1430.  Ramond Dial, Pharm.D Clinical Pharmacist  02/25/2015,1:25 PM

## 2015-02-25 NOTE — H&P (Signed)
[place] [facility] [place] NAME: [name]    MR#:  [id number]  DATE OF BIRTH:  June 24, 1940  DATE OF ADMISSION:  02/25/2015  PRIMARY CARE PHYSICIAN: Rusty Aus., MD   REQUESTING/REFERRING PHYSICIAN: Schaevitz, MD  CHIEF COMPLAINT:  Generalized weakness and chills  HISTORY OF PRESENT ILLNESS:  [name]  is a 74 y.o. male with a known history of insulin requiring diabetes mellitus, coronary artery disease status post 4 vessel CABG, status post cardiac catheterization by Dr. Ubaldo Glassing 6 months ago, recommended medical management at the time is presenting to the ED with a chief complaint of generalized weakness. Patient was just recently diagnosed with pancreatic cancer on last Thursday via EUS. Patient is reporting that he has been experiencing chills and rigors associated with generalized weakness since last night and feels very tired to ambulate. Denies any acute on chronic chest pain or shortness of breath. Denies any abdominal pain, nausea or vomiting. Wife is at bedside.  PAST MEDICAL HISTORY:   Past Medical History  Diagnosis Date  . Hyperlipidemia   . H/O cardiac catheterization 06/03/06,12/03/11  . BPH (benign prostatic hypertrophy)   . Anxiety   . Syncope     LAUGHING INDUCED  . Myocardial infarction ([place])     x 2  . CHF (congestive heart failure) ([place])   . CAD (coronary artery disease) 06/03/06    cypher 2.5 x 91mm DES for 80% mid RCA sees Dr. Ubaldo Glassing  . Asthma     as a child  . Sleep apnea     hx of  . Kidney stone     hx of  . GERD (gastroesophageal reflux disease)     on medication for  . Cancer ([place])     hx of skin ca in "corner of left eye"  . Arthritis   . Anemia     taking iron  . Cataract, bilateral   . Neuromuscular disorder ([place])     diabetic neuropathy  . Diabetes mellitus without complication ([place])     PAST SURGICAL HISTOIRY:   Past Surgical History  Procedure Laterality Date  . Spine surgery   9/05    CERVICAL LAMINECTOMY  . Cervical herniated    . Eye surgery      as a child  . Coronary artery bypass graft  12/12/2011    Procedure: CORONARY ARTERY BYPASS GRAFTING (CABG);  Surgeon: Gaye Pollack, MD;  Location: [facility];  Service: Open Heart Surgery;  Laterality: N/A;  coronary artery bypass graft times four using left internal mammary artery, right leg greater saphenous vein harvested endoscopically  . Cardiac catheterization N/A 10/26/2014    Procedure: Left Heart Cath;  Surgeon: Isaias Cowman, MD;  Location: [facility];  Service: Cardiovascular;  Laterality: N/A;  . Endoscopic retrograde cholangiopancreatography (ercp) with propofol N/A 02/13/2015    Procedure: ENDOSCOPIC RETROGRADE CHOLANGIOPANCREATOGRAPHY (ERCP) WITH PROPOFOL;  Surgeon: Lucilla Lame, MD;  Location: [facility];  Service: Endoscopy;  Laterality: N/A;  . Eus N/A 02/23/2015    Procedure: UPPER ENDOSCOPIC ULTRASOUND (EUS) LINEAR;  Surgeon: Holly Bodily, MD;  Location: [facility];  Service: Gastroenterology;  Laterality: N/A;    SOCIAL HISTORY:   Social History  Substance Use Topics  . Smoking status: Former Smoker    Types: Cigarettes, Cigars    Quit date: 12/03/1977  . Smokeless tobacco: Never Used  . Alcohol Use: No    FAMILY HISTORY:   Family History  Problem Relation  Age of Onset  . Diabetes Mellitus II Mother     Also Brother and sister  . Pancreatic cancer Mother   . Leukemia Father   . Diabetes Mellitus II Sister   . Diabetes Mellitus II Brother     DRUG ALLERGIES:   Allergies  Allergen Reactions  . Zithromax [Azithromycin] Anaphylaxis and Other (See Comments)    Reaction:  Stroke-like symptoms   . Lisinopril Other (See Comments)    Per spouse, pt did not tolerate well in the past.      REVIEW OF SYSTEMS:  CONSTITUTIONAL: Reporting low-grade fever, fatigue and  weakness.  EYES: No blurred or double vision.  EARS, NOSE, AND THROAT: No tinnitus or ear pain.   RESPIRATORY: No cough, shortness of breath, wheezing or hemoptysis.  CARDIOVASCULAR:   reporting chronic chest pain,  Denies orthopnea, edema.  GASTROINTESTINAL: No nausea, vomiting, diarrhea or abdominal pain.  GENITOURINARY: No dysuria, hematuria.  ENDOCRINE: No polyuria, nocturia,  HEMATOLOGY: No anemia, easy bruising or bleeding SKIN: No rash or lesion. MUSCULOSKELETAL: No joint pain or arthritis.   NEUROLOGIC: No tingling, numbness, weakness.  PSYCHIATRY: No anxiety or depression.   MEDICATIONS AT HOME:   Prior to Admission medications   Medication Sig Start Date End Date Taking? Authorizing Provider  HYDROcodone-acetaminophen (NORCO/VICODIN) 5-325 MG tablet Take 1 tablet by mouth every 6 (six) hours as needed. For pain. 02/24/15  Yes Historical Provider, MD  meloxicam (MOBIC) 7.5 MG tablet Take 7.5 mg by mouth daily. 11/25/14  Yes Historical Provider, MD  polyethylene glycol powder (GLYCOLAX/MIRALAX) powder Take 17 g by mouth daily as needed. Constipation. 01/10/15  Yes Historical Provider, MD  acetaminophen (TYLENOL) 325 MG tablet Take 650 mg by mouth 2 (two) times daily as needed.    Historical Provider, MD  aspirin EC 81 MG tablet Take 81 mg by mouth daily.    Historical Provider, MD  gabapentin (NEURONTIN) 300 MG capsule Take 300 mg by mouth 2 (two) times daily.    Historical Provider, MD  glimepiride (AMARYL) 4 MG tablet Take 4 mg by mouth daily.    Historical Provider, MD  insulin glargine (LANTUS) 100 UNIT/ML injection Inject 40 Units into the skin 2 (two) times daily.    Historical Provider, MD  insulin regular (NOVOLIN R,HUMULIN R) 100 units/mL injection Inject 15-20 Units into the skin 3 (three) times daily before meals. Pt uses as needed per sliding scale.    Historical Provider, MD  lovastatin (MEVACOR) 20 MG tablet Take 20 mg by mouth at bedtime.    Historical Provider, MD  omeprazole (PRILOSEC) 20 MG capsule Take 20 mg by mouth 2 (two) times daily.    Historical Provider,  MD  torsemide (DEMADEX) 20 MG tablet Take 20 mg by mouth 2 (two) times daily.     Historical Provider, MD      VITAL SIGNS:  Blood pressure 96/51, pulse 85, temperature 100.2 F (37.9 C), temperature source Oral, resp. rate 14, height 5\' 9"  (1.753 m), weight 94.121 kg (207 lb 8 oz), SpO2 98 %.  PHYSICAL EXAMINATION:  GENERAL:  74 y.o.-year-old patient lying in the bed with no acute distress.  EYES: Pupils equal, round, reactive to light and accommodation. No scleral icterus. Extraocular muscles intact.  HEENT: Head atraumatic, normocephalic. Oropharynx and nasopharynx clear.  NECK:  Supple, no jugular venous distention. No thyroid enlargement, no tenderness.  LUNGS: Normal breath sounds bilaterally, no wheezing, rales,rhonchi or crepitation. No use of accessory muscles of respiration.  CARDIOVASCULAR: S1, S2 normal.  No murmurs, rubs, or gallops.  are palpable anterior chest wall tenderness  ABDOMEN: Soft, nontender,  Obese,nondistended. Bowel sounds present. No organomegaly or mass.  EXTREMITIES:  trace pedal edema,  No cyanosis, or clubbing.  NEUROLOGIC: Cranial nerves II through XII are intact. Muscle strength 5/5 in all extremities. Sensation intact. Gait not checked.  PSYCHIATRIC: The patient is alert and oriented x 3.  SKIN: No obvious rash, lesion, or ulcer.   LABORATORY PANEL:   CBC  Recent Labs Lab 02/25/15 1204  WBC 16.6*  HGB 9.3*  HCT 29.2*  PLT 236   ------------------------------------------------------------------------------------------------------------------  Chemistries   Recent Labs Lab 02/25/15 1204  NA 139  K 3.7  CL 107  CO2 25  GLUCOSE 122*  BUN 26*  CREATININE 2.02*  CALCIUM 8.0*  AST 96*  ALT 55  ALKPHOS 251*  BILITOT 3.4*   ------------------------------------------------------------------------------------------------------------------  Cardiac Enzymes  Recent Labs Lab 02/25/15 1204  TROPONINI 0.07*    ------------------------------------------------------------------------------------------------------------------  RADIOLOGY:  Dg Chest 2 View  02/25/2015  CLINICAL DATA:  74 year old male with a history of weakness and dizziness EXAM: CHEST - 2 VIEW COMPARISON:  01/22/2015, 10/26/2014 FINDINGS: Cardiomediastinal silhouette unchanged, with borderline cardiomegaly. Atherosclerotic calcifications of the aortic arch. Surgical changes of median sternotomy. Surgical changes of the cervical region again noted. Right cervical rib. Indistinctness of the central vasculature, particularly on the lateral view with interlobular septal opacities. No pleural effusion.  No confluent airspace disease. No displaced fracture. Unremarkable appearance of the upper abdomen. IMPRESSION: Low lung volumes, with appearance of developing pulmonary vascular congestion/ early edema. No lobar pneumonia or pleural effusion. Surgical changes of prior median sternotomy. Atherosclerosis. Signed, Dulcy Fanny. Earleen Newport, DO [facility] Radiology Electronically Signed   By: Corrie Mckusick D.O.   On: 02/25/2015 12:30   Ct Chest Wo Contrast  02/25/2015  CLINICAL DATA:  73 year old male with weakness and chest pain over the past 2 weeks as well as shortness of breath on exertion. EXAM: CT CHEST WITHOUT CONTRAST TECHNIQUE: Multidetector CT imaging of the chest was performed following the standard protocol without IV contrast. COMPARISON:  Chest x-ray earlier today ; recent prior CT of the abdomen and pelvis including the lung bases 02/14/2015 FINDINGS: Mediastinum: Unremarkable CT appearance of the thyroid gland. No suspicious mediastinal or hilar adenopathy. No soft tissue mediastinal mass. The thoracic esophagus is unremarkable. Heart/Vascular: Limited evaluation in the absence of intravenous contrast. Atherosclerotic calcifications noted throughout the transverse aorta and coronary arteries. Patient  is status post median sternotomy with evidence of prior multivessel CABG including LIMA bypass. Mild cardiomegaly. No pericardial effusion. No aortic aneurysm. Normal caliber main pulmonary artery. Lungs/Pleura: Mild respiratory motion artifact. Mild dependent atelectasis. No focal airspace consolidation, evidence of pulmonary edema or suspicious pulmonary mass or nodule. Mild plaque-like calcification of the posterior pleura on the left. Healed median sternotomy. Bones/Soft Tissues: No acute fracture or aggressive appearing lytic or blastic osseous lesion. Upper Abdomen: Visualized upper abdominal organs are unremarkable. Expected pneumobilia given history of a biliary stent. Otherwise, unremarkable visualized upper abdomen. IMPRESSION: 1. No acute abnormality on this noncontrast CT scan of the chest. 2. Stable cardiomegaly with surgical changes of multivessel CABG. 3. Mild dependent atelectasis. 4. Plaque-like calcifications in the posterior pleural on the left. Electronically Signed   By: Jacqulynn Cadet M.D.   On: 02/25/2015 13:36    EKG:   Orders placed or performed during the hospital encounter of 02/25/15  . ED EKG  . ED EKG  IMPRESSION AND PLAN:   Osric Klopf  is a 74 y.o. male with a known history of insulin requiring diabetes mellitus, coronary artery disease status post 4 vessel CABG, status post cardiac catheterization by Dr. Ubaldo Glassing 6 months ago, recommended medical management at the time is presenting to the ED with a chief complaint of generalized weakness. Patient was just recently diagnosed with pancreatic cancer on last Thursday via EUS. Patient is reporting that he has been experiencing chills and rigors associated with generalized weakness since last night and feels very tired to ambulate. Denies any acute on chronic chest pain or shortness of breath.  1. Sepsis-meets criteria for sepsis with leukocytosis, fever and elevated lactic acid-unclear etiology at this time Septic  protocol was implemented the ED with blood cultures 2 and urine culture and sensitivity. Chest x-ray revealed no pneumonia or infiltrate but vascular congestion. Patient is started on broad-spectrum antibiotics with IV Zosyn and vancomycin, we will narrow down the antibiotics once culture results are available Provide hydration with IV fluids and close monitoring for symptoms and signs of fluid overload up as patient has history of congestive heart failure  2. History of coronary artery disease status post CABG with elevated troponin-probably from demand ischemia and acute renal insufficiency Patient is asymptomatic. He always has chronic chest pain. We will cycle cardiac biomarkers and monitor patient on telemetry. Continue aspirin 325 mg and statin. Provide hydration with IV fluids Cardiology consult is placed to patient's family cardiologist Dr. Ubaldo Glassing  3. Acute kidney injury on chronic renal insufficiency Provide hydration with IV fluids and avoid nephrotoxins However we will continue Demadex by mouth as chest x-ray has revealed pulmonary vascular congestion the patient is asymptomatic  4. Chronic history of congestive heart failure Will continue his home medication Demadex and monitor his renal function closely, currently patient is asymptomatic  5. Insulin requiring diabetes mellitus Patient takes 40 units of Lantus twice a day at home, as patient is sick and with poor by mouth intake will cut down the Lantus to 20 units twice a day and continue sliding scale insulin  6. Recent diagnosis of pancreatic cancer  Patient was diagnosed with pancreatic cancer on last Thursday , has an appointment to follow with Dr. Ignatius Specking on Monday at local [place]  All the records are reviewed and case discussed with ED provider. Management plans discussed with the patient, family and they are in agreement.  CODE STATUS: Full code, wife is the healthcare power of attorney Greater than 50% time was  spent on coordination of care and face-to-face counseling.  TOTAL TIME TAKING CARE OF THIS PATIENT: 45 minutes.    Nicholes Mango M.D on 02/25/2015 at 2:38 PM  Between 7am to 6pm - Pager - [phone]  After 6pm go to [email or web address] - password [email or web address] [facility]  [facility]  Office  [phone]  CC: Primary care physician; Rusty Aus., MD

## 2015-02-26 LAB — COMPREHENSIVE METABOLIC PANEL
ALBUMIN: 2.4 g/dL — AB (ref 3.5–5.0)
ALK PHOS: 209 U/L — AB (ref 38–126)
ALT: 42 U/L (ref 17–63)
AST: 50 U/L — AB (ref 15–41)
Anion gap: 4 — ABNORMAL LOW (ref 5–15)
BUN: 26 mg/dL — AB (ref 6–20)
CALCIUM: 7.9 mg/dL — AB (ref 8.9–10.3)
CO2: 26 mmol/L (ref 22–32)
CREATININE: 1.68 mg/dL — AB (ref 0.61–1.24)
Chloride: 108 mmol/L (ref 101–111)
GFR calc Af Amer: 45 mL/min — ABNORMAL LOW (ref >60)
GFR calc non Af Amer: 38 mL/min — ABNORMAL LOW (ref >60)
GLUCOSE: 208 mg/dL — AB (ref 65–99)
Potassium: 3.6 mmol/L (ref 3.5–5.1)
SODIUM: 138 mmol/L (ref 135–145)
Total Bilirubin: 2.6 mg/dL — ABNORMAL HIGH (ref 0.3–1.2)
Total Protein: 6.2 g/dL — ABNORMAL LOW (ref 6.5–8.1)

## 2015-02-26 LAB — CBC
HCT: 27.1 % — ABNORMAL LOW (ref 40.0–52.0)
HEMOGLOBIN: 8.7 g/dL — AB (ref 13.0–18.0)
MCH: 28.1 pg (ref 26.0–34.0)
MCHC: 32.2 g/dL (ref 32.0–36.0)
MCV: 87.3 fL (ref 80.0–100.0)
Platelets: 201 10*3/uL (ref 150–440)
RBC: 3.1 MIL/uL — ABNORMAL LOW (ref 4.40–5.90)
RDW: 19.5 % — ABNORMAL HIGH (ref 11.5–14.5)
WBC: 8 10*3/uL (ref 3.8–10.6)

## 2015-02-26 LAB — GLUCOSE, CAPILLARY
Glucose-Capillary: 269 mg/dL — ABNORMAL HIGH (ref 65–99)
Glucose-Capillary: 190 mg/dL — ABNORMAL HIGH (ref 65–99)
Glucose-Capillary: 204 mg/dL — ABNORMAL HIGH (ref 65–99)

## 2015-02-26 LAB — TROPONIN I: TROPONIN I: 0.07 ng/mL — AB (ref <0.031)

## 2015-02-26 MED ORDER — INSULIN GLARGINE 100 UNIT/ML ~~LOC~~ SOLN
30.0000 [IU] | Freq: Two times a day (BID) | SUBCUTANEOUS | Status: DC
  Administered 2015-02-26 – 2015-03-01 (×6): 30 [IU] via SUBCUTANEOUS
  Filled 2015-02-26 (×7): qty 0.3

## 2015-02-26 NOTE — Progress Notes (Signed)
Lab called with growth in blood culture.  Gram neg rods in aerobic tube.  MD, Dr. Lavetta Nielsen notified.  Pt already had Vanc and getting Zosyn.  Pt is covered.  Will continue to monitor. Jessee Avers

## 2015-02-26 NOTE — Consult Note (Signed)
[facility]  CARDIOLOGY CONSULT NOTE  Patient ID: [name] MRN: [id number] DOB/AGE: Feb 08, 1941 74 y.o.  Admit date: 02/25/2015 Referring Physician Dr. Volanda Napoleon Primary Physician   Primary Cardiologist Ramiro Pangilinan Reason for Consultation weakness and abnormal troponin  HPI: 74 yo male with history of cad s/p cabg with lima to lad, svg to pda and om3 and d2 with 3 vessel cad admitted with complaints of chills, rigors and weakness. Minimal troponin elevation to 0.06. Cardiac cath done 7/16 revealed three vessel cad with patent lima to lad, patent svg to pda and om3 with occluded svg to d1 and normal lv function with inferior wall hypokinesis. Due to patent lima and pda graphs, no further invasive treatment was indicated. For the past several days, patient has noted generalized body weakness, intermitant chills and rigors. He was unaware of fever. No specific chest pain but diffuse body discomfort. IN er his ekg waas unremarkable, serum troponin was 0.07  Blood culture has grown gnr. Patient was place in iv vancomycin and zosyn. He is still weak. No further rigors. He has been compliant with his meds.   ROS Review of Systems - General ROS: positive for  - chills, fever, malaise and generalized weakness and rigors Respiratory ROS: positive for - shortness of breath Cardiovascular ROS: positive for - chest pain and dyspnea on exertion Gastrointestinal ROS: no abdominal pain, change in bowel habits, or black or bloody stools Musculoskeletal ROS: positive for - muscular weakness Neurological ROS: no TIA or stroke symptoms   Past Medical History  Diagnosis Date  . Hyperlipidemia   . H/O cardiac catheterization 06/03/06,12/03/11  . BPH (benign prostatic hypertrophy)   . Anxiety   . Syncope     LAUGHING INDUCED  . Myocardial infarction ([place])     x 2  . CHF (congestive heart failure) ([place])   . CAD (coronary artery disease) 06/03/06    cypher 2.5 x 87mm DES for  80% mid RCA sees Dr. Ubaldo Glassing  . Asthma     as a child  . Sleep apnea     hx of  . Kidney stone     hx of  . GERD (gastroesophageal reflux disease)     on medication for  . 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 Arthritis   . Anemia     taking iron  . Cataract, bilateral   . Neuromuscular disorder ([place])     diabetic neuropathy  . Diabetes mellitus without complication ([place])     Family History  Problem Relation Age of Onset  . Diabetes Mellitus II Mother     Also Brother and sister  . Pancreatic cancer Mother   . Leukemia Father   . Diabetes Mellitus II Sister   . Diabetes Mellitus II Brother     Social History   Social History  . Marital Status: Married    Spouse Name: N/A  . Number of Children: N/A  . Years of Education: N/A   Occupational History  . Not on file.   Social History Main Topics  . Smoking status: Former Smoker    Types: Cigarettes, Cigars    Quit date: 12/03/1977  . Smokeless tobacco: Never Used  . Alcohol Use: No  . Drug Use: No  . Sexual Activity: Not on file   Other Topics Concern  . Not on file   Social History Narrative   Lives with his wife  Past Surgical History  Procedure Laterality Date  . Spine surgery  9/05    CERVICAL LAMINECTOMY  . Cervical herniated    . Eye surgery      as a child  . Coronary artery bypass graft  12/12/2011    Procedure: CORONARY ARTERY BYPASS GRAFTING (CABG);  Surgeon: Gaye Pollack, MD;  Location: [facility];  Service: Open Heart Surgery;  Laterality: N/A;  coronary artery bypass graft times four using left internal mammary artery, right leg greater saphenous vein harvested endoscopically  . Cardiac catheterization N/A 10/26/2014    Procedure: Left Heart Cath;  Surgeon: Isaias Cowman, MD;  Location: [facility];  Service: Cardiovascular;  Laterality: N/A;  . Endoscopic retrograde cholangiopancreatography (ercp) with propofol N/A 02/13/2015    Procedure: ENDOSCOPIC RETROGRADE  CHOLANGIOPANCREATOGRAPHY (ERCP) WITH PROPOFOL;  Surgeon: Lucilla Lame, MD;  Location: [facility];  Service: Endoscopy;  Laterality: N/A;  . Eus N/A 02/23/2015    Procedure: UPPER ENDOSCOPIC ULTRASOUND (EUS) LINEAR;  Surgeon: Holly Bodily, MD;  Location: [facility];  Service: Gastroenterology;  Laterality: N/A;     Prescriptions prior to admission  Medication Sig Dispense Refill Last Dose  . acetaminophen (TYLENOL) 650 MG CR tablet Take 1,300 mg by mouth every 8 (eight) hours as needed for pain.   02/25/2015 at Unknown time  . aspirin EC 81 MG tablet Take 81 mg by mouth daily.   02/24/2015 at Unknown time  . gabapentin (NEURONTIN) 300 MG capsule Take 300 mg by mouth 2 (two) times daily.   02/24/2015 at Unknown time  . glimepiride (AMARYL) 4 MG tablet Take 4 mg by mouth daily.   02/24/2015 at Unknown time  . HYDROcodone-acetaminophen (NORCO/VICODIN) 5-325 MG tablet Take 1 tablet by mouth every 6 (six) hours as needed. For pain.   02/24/2015 at Unknown time  . insulin glargine (LANTUS) 100 UNIT/ML injection Inject 40 Units into the skin 2 (two) times daily.   02/24/2015 at Unknown time  . insulin regular (NOVOLIN R,HUMULIN R) 100 units/mL injection Inject 5-15 Units into the skin 3 (three) times daily before meals. Pt uses as needed per sliding scale.   02/24/2015 at Unknown time  . lovastatin (MEVACOR) 20 MG tablet Take 20 mg by mouth at bedtime.   02/24/2015 at Unknown time  . meloxicam (MOBIC) 7.5 MG tablet Take 7.5 mg by mouth daily.   02/24/2015 at Unknown time  . metolazone (ZAROXOLYN) 2.5 MG tablet Take 2.5 mg by mouth daily as needed (for weight gain).    prn  . omeprazole (PRILOSEC) 20 MG capsule Take 20 mg by mouth 2 (two) times daily.   02/24/2015 at Unknown time  . polyethylene glycol powder (GLYCOLAX/MIRALAX) powder Take 17 g by mouth daily as needed. Constipation.   02/11/2015  . torsemide (DEMADEX) 20 MG tablet Take 10-20 mg by mouth See admin instructions. Take 1 tablet orally once  a day in the morning. Take 1/2 tablet (10mg ) orally once a day in the evening.   02/24/2015 at Unknown time    Physical Exam: Blood pressure 128/56, pulse 82, temperature 98.3 F (36.8 C), temperature source Oral, resp. rate 18, height 5\' 9"  (1.753 m), weight 91.491 kg (201 lb 11.2 oz), SpO2 99 %.    General appearance: alert and cooperative Resp: clear to auscultation bilaterally Cardio: regular rate and rhythm GI: soft, non-tender; bowel sounds normal; no masses,  no organomegaly Extremities: extremities normal, atraumatic, no cyanosis or edema Neurologic: Grossly normal Labs:   Lab Results  Component Value Date   WBC 8.0 02/26/2015   HGB 8.7* 02/26/2015   HCT 27.1* 02/26/2015   MCV 87.3 02/26/2015   PLT 201 02/26/2015    Recent Labs Lab 02/26/15 0448  NA 138  K 3.6  CL 108  CO2 26  BUN 26*  CREATININE 1.68*  CALCIUM 7.9*  PROT 6.2*  BILITOT 2.6*  ALKPHOS 209*  ALT 42  AST 50*  GLUCOSE 208*   Lab Results  Component Value Date   TROPONINI 0.07* 02/26/2015      Radiology: cxr revealed no pna or effusion. Mild pulmonary vascular congestion. Chest ct was unremarkable to acute abnormalities.  EKG: NSR  ASSESSMENT AND PLAN:  Pt with history of cad s/p cabg with cath 4 months ago revealing 3vd with patent lima to lad, svg to om and pda with chronically occluded svg to d1 with no further invervention indicated admitted with generalized weakness, rigors and chills with gnr in blood culture so far. Mild troponin elevation does not appear to suggest acs. Would continue with aggressive iv abx in cluding vanc and zosyn for probable gnr sepsis. Conintue with careful use of torsemide given cxr following renal function and hemodynics. Ok to continue pravastatin for now following hepatic function. Continue with ec asa. Does not appear to require invasive or noninvasive ischemic workup at present. Will follow with you.  Signed: Teodoro Spray MD, [facility] 02/26/2015, 9:26 AM

## 2015-02-26 NOTE — Progress Notes (Signed)
[place] [facility] [place] NAME: [name]    MR#:  [id number]  DATE OF BIRTH:  Nov 28, 1940  SUBJECTIVE:  CHIEF COMPLAINT:   Chief Complaint  Patient presents with  . Weakness   No complaint but fever. REVIEW OF SYSTEMS:  CONSTITUTIONAL: has fever and weakness.  EYES: No blurred or double vision.  EARS, NOSE, AND THROAT: No tinnitus or ear pain.  RESPIRATORY: No cough, shortness of breath, wheezing or hemoptysis.  CARDIOVASCULAR: No chest pain, orthopnea, edema.  GASTROINTESTINAL: No nausea, vomiting, diarrhea or abdominal pain.  GENITOURINARY: No dysuria, hematuria.  ENDOCRINE: No polyuria, nocturia,  HEMATOLOGY: No anemia, easy bruising or bleeding SKIN: No rash or lesion. MUSCULOSKELETAL: No joint pain or arthritis.   NEUROLOGIC: No tingling, numbness, weakness.  PSYCHIATRY: No anxiety or depression.   DRUG ALLERGIES:   Allergies  Allergen Reactions  . Zithromax [Azithromycin] Anaphylaxis and Other (See Comments)    Reaction:  Stroke-like symptoms   . Lisinopril Other (See Comments)    Per spouse, pt did not tolerate well in the past.      VITALS:  Blood pressure 158/54, pulse 81, temperature 100.4 F (38 C), temperature source Oral, resp. rate 18, height 5\' 9"  (1.753 m), weight 91.491 kg (201 lb 11.2 oz), SpO2 94 %.  PHYSICAL EXAMINATION:  GENERAL:  74 y.o.-year-old patient lying in the bed with no acute distress.  EYES: Pupils equal, round, reactive to light and accommodation. No scleral icterus. Extraocular muscles intact.  HEENT: Head atraumatic, normocephalic. Oropharynx and nasopharynx clear. Moist oral mucosa. NECK:  Supple, no jugular venous distention. No thyroid enlargement, no tenderness.  LUNGS: Normal breath sounds bilaterally, no wheezing, rales,rhonchi or crepitation. No use of accessory muscles of respiration.  CARDIOVASCULAR: S1, S2 normal. No murmurs, rubs, or gallops.  ABDOMEN: Soft, nontender,  nondistended. Bowel sounds present. No organomegaly or mass.  EXTREMITIES: No pedal edema, cyanosis, or clubbing.  NEUROLOGIC: Cranial nerves II through XII are intact. Muscle strength 5/5 in all extremities. Sensation intact. Gait not checked.  PSYCHIATRIC: The patient is alert and oriented x 3.  SKIN: No obvious rash, lesion, or ulcer.    LABORATORY PANEL:   CBC  Recent Labs Lab 02/26/15 0448  WBC 8.0  HGB 8.7*  HCT 27.1*  PLT 201   ------------------------------------------------------------------------------------------------------------------  Chemistries   Recent Labs Lab 02/26/15 0448  NA 138  K 3.6  CL 108  CO2 26  GLUCOSE 208*  BUN 26*  CREATININE 1.68*  CALCIUM 7.9*  AST 50*  ALT 42  ALKPHOS 209*  BILITOT 2.6*   ------------------------------------------------------------------------------------------------------------------  Cardiac Enzymes  Recent Labs Lab 02/26/15 0448  TROPONINI 0.07*   ------------------------------------------------------------------------------------------------------------------  RADIOLOGY:  Dg Chest 2 View  02/25/2015  CLINICAL DATA:  74 year old male with a history of weakness and dizziness EXAM: CHEST - 2 VIEW COMPARISON:  01/22/2015, 10/26/2014 FINDINGS: Cardiomediastinal silhouette unchanged, with borderline cardiomegaly. Atherosclerotic calcifications of the aortic arch. Surgical changes of median sternotomy. Surgical changes of the cervical region again noted. Right cervical rib. Indistinctness of the central vasculature, particularly on the lateral view with interlobular septal opacities. No pleural effusion.  No confluent airspace disease. No displaced fracture. Unremarkable appearance of the upper abdomen. IMPRESSION: Low lung volumes, with appearance of developing pulmonary vascular congestion/ early edema. No lobar pneumonia or pleural effusion. Surgical changes of prior median sternotomy. Atherosclerosis. Signed,  Dulcy Fanny. Earleen Newport, DO [facility] Electronically Signed   By: York Cerise  Earleen Newport D.O.   On: 02/25/2015 12:30   Ct Chest Wo Contrast  02/25/2015  CLINICAL DATA:  74 year old male with weakness and chest pain over the past 2 weeks as well as shortness of breath on exertion. EXAM: CT CHEST WITHOUT CONTRAST TECHNIQUE: Multidetector CT imaging of the chest was performed following the standard protocol without IV contrast. COMPARISON:  Chest x-ray earlier today ; recent prior CT of the abdomen and pelvis including the lung bases 02/14/2015 FINDINGS: Mediastinum: Unremarkable CT appearance of the thyroid gland. No suspicious mediastinal or hilar adenopathy. No soft tissue mediastinal mass. The thoracic esophagus is unremarkable. Heart/Vascular: Limited evaluation in the absence of intravenous contrast. Atherosclerotic calcifications noted throughout the transverse aorta and coronary arteries. 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 Mild cardiomegaly. No pericardial effusion. No aortic aneurysm. Normal caliber main pulmonary artery. Lungs/Pleura: Mild respiratory motion artifact. Mild dependent atelectasis. No focal airspace consolidation, evidence of pulmonary edema or suspicious pulmonary mass or nodule. Mild plaque-like calcification of the posterior pleura on the left. Healed median sternotomy. Bones/Soft Tissues: No acute fracture or aggressive appearing lytic or blastic osseous lesion. Upper Abdomen: Visualized upper abdominal organs are unremarkable. Expected pneumobilia given history of a biliary stent. Otherwise, unremarkable visualized upper abdomen. IMPRESSION: 1. No acute abnormality on this noncontrast CT scan of the chest. 2. Stable cardiomegaly with surgical changes of multivessel CABG. 3. Mild dependent atelectasis. 4. Plaque-like calcifications in the posterior pleural on the left.  Electronically Signed   By: Jacqulynn Cadet M.D.   On: 02/25/2015 13:36    EKG:   Orders placed or performed during the hospital encounter of 02/25/15  . ED EKG  . ED EKG    ASSESSMENT AND PLAN:   1. Sepsis with bacteremia. Blood culture: GNR. continue IV Zosyn and vancomycin, f/u sensitivity and ID consult.  2. History of coronary artery disease status post CABG with elevated troponin-probably from demand ischemia and acute renal insufficiency Continue aspirin 325 mg and statin, no ischemic workup per Dr. Ubaldo Glassing.  3. Acute kidney injury on chronic renal insufficiency Improving with IV fluid, discontinue IVF, continue Demadex.  4. Chronic history of congestive heart failure. Stable. Continue Demadex.  5. Insulin requiring diabetes mellitus Not well controlled, increase from 20 to 30 units of Lantus twice a day at home and continue sliding scale insulin.  6. Recent diagnosis of pancreatic cancer  Oncology consult from Dr. Ignatius Specking.    All the records are reviewed and case discussed with Care Management/Social Workerr. Management plans discussed with the patient, his wife and son and they are in agreement. Greater than 50% time was spent on coordination of care and face-to-face counseling.  CODE STATUS: Full code  TOTAL TIME TAKING CARE OF THIS PATIENT: 42 minutes.   POSSIBLE D/C IN 3-4 DAYS, DEPENDING ON CLINICAL CONDITION.   Demetrios Loll M.D on 02/26/2015 at 12:29 PM  Between 7am to 6pm - Pager - [phone]  After 6pm go to [email or web address] - password [email or web address] [facility]  [place] [facility]  Office  [phone]  CC: Primary care physician; Rusty Aus., MD

## 2015-02-27 ENCOUNTER — Other Ambulatory Visit: Payer: Commercial Managed Care - HMO

## 2015-02-27 ENCOUNTER — Ambulatory Visit: Payer: Commercial Managed Care - HMO | Admitting: Internal Medicine

## 2015-02-27 DIAGNOSIS — N400 Enlarged prostate without lower urinary tract symptoms: Secondary | ICD-10-CM

## 2015-02-27 DIAGNOSIS — I2510 Atherosclerotic heart disease of native coronary artery without angina pectoris: Secondary | ICD-10-CM

## 2015-02-27 DIAGNOSIS — C259 Malignant neoplasm of pancreas, unspecified: Secondary | ICD-10-CM

## 2015-02-27 DIAGNOSIS — R944 Abnormal results of kidney function studies: Secondary | ICD-10-CM

## 2015-02-27 DIAGNOSIS — Z85828 Personal history of other malignant neoplasm of skin: Secondary | ICD-10-CM

## 2015-02-27 DIAGNOSIS — I509 Heart failure, unspecified: Secondary | ICD-10-CM

## 2015-02-27 DIAGNOSIS — I252 Old myocardial infarction: Secondary | ICD-10-CM

## 2015-02-27 DIAGNOSIS — G4730 Sleep apnea, unspecified: Secondary | ICD-10-CM

## 2015-02-27 DIAGNOSIS — R531 Weakness: Secondary | ICD-10-CM

## 2015-02-27 DIAGNOSIS — K219 Gastro-esophageal reflux disease without esophagitis: Secondary | ICD-10-CM

## 2015-02-27 DIAGNOSIS — A419 Sepsis, unspecified organism: Secondary | ICD-10-CM

## 2015-02-27 DIAGNOSIS — E785 Hyperlipidemia, unspecified: Secondary | ICD-10-CM

## 2015-02-27 DIAGNOSIS — M129 Arthropathy, unspecified: Secondary | ICD-10-CM

## 2015-02-27 DIAGNOSIS — Z87442 Personal history of urinary calculi: Secondary | ICD-10-CM

## 2015-02-27 DIAGNOSIS — R410 Disorientation, unspecified: Secondary | ICD-10-CM

## 2015-02-27 DIAGNOSIS — F419 Anxiety disorder, unspecified: Secondary | ICD-10-CM

## 2015-02-27 LAB — URINE CULTURE: Culture: NO GROWTH

## 2015-02-27 LAB — BASIC METABOLIC PANEL
Anion gap: 7 (ref 5–15)
BUN: 25 mg/dL — AB (ref 6–20)
CALCIUM: 8.4 mg/dL — AB (ref 8.9–10.3)
CO2: 26 mmol/L (ref 22–32)
CREATININE: 1.72 mg/dL — AB (ref 0.61–1.24)
Chloride: 107 mmol/L (ref 101–111)
GFR calc Af Amer: 43 mL/min — ABNORMAL LOW (ref >60)
GFR, EST NON AFRICAN AMERICAN: 37 mL/min — AB (ref >60)
Glucose, Bld: 158 mg/dL — ABNORMAL HIGH (ref 65–99)
Potassium: 3.7 mmol/L (ref 3.5–5.1)
SODIUM: 140 mmol/L (ref 135–145)

## 2015-02-27 LAB — GLUCOSE, CAPILLARY
GLUCOSE-CAPILLARY: 149 mg/dL — AB (ref 65–99)
GLUCOSE-CAPILLARY: 172 mg/dL — AB (ref 65–99)
GLUCOSE-CAPILLARY: 155 mg/dL — AB (ref 65–99)
Glucose-Capillary: 189 mg/dL — ABNORMAL HIGH (ref 65–99)

## 2015-02-27 LAB — HEMOGLOBIN A1C: HEMOGLOBIN A1C: 7.5 % — AB (ref 4.0–6.0)

## 2015-02-27 MED ORDER — DEXTROSE 5 % IV SOLN
2.0000 g | INTRAVENOUS | Status: DC
  Administered 2015-02-27 – 2015-02-28 (×2): 2 g via INTRAVENOUS
  Filled 2015-02-27 (×3): qty 2

## 2015-02-27 MED ORDER — DEXTROSE 5 % IV SOLN: 1.0000 g | INTRAVENOUS | Status: DC

## 2015-02-27 MED ORDER — ASPIRIN EC 81 MG PO TBEC
81.0000 mg | DELAYED_RELEASE_TABLET | Freq: Every day | ORAL | Status: DC
  Administered 2015-02-28 – 2015-03-01 (×2): 81 mg via ORAL
  Filled 2015-02-27 (×2): qty 1

## 2015-02-27 NOTE — Plan of Care (Signed)
Problem: Activity: Goal: Risk for activity intolerance will decrease Outcome: Progressing Pt up in chair. Ambulate with one stand by assist.

## 2015-02-27 NOTE — Clinical Documentation Improvement (Signed)
Internal Medicine  Can the diagnosis of CKD be further specified?   CKD Stage I - GFR greater than or equal to 90  CKD Stage II - GFR 60-89  CKD Stage III - GFR 30-59  CKD Stage IV - GFR 15-29  CKD Stage V - GFR < 15  ESRD (End Stage Renal Disease)  Other condition  Unable to clinically determine   Supporting Information: : (risk factors, signs and symptoms, diagnostics, treatment) 02/25/15 H&P: "Acute kidney injury on chronic renal insufficiency" GFR= 31  Please exercise your independent, professional judgment when responding. A specific answer is not anticipated or expected.   Thank You, [place] [phone]

## 2015-02-27 NOTE — Plan of Care (Signed)
Problem: Fluid Volume: Goal: Ability to maintain a balanced intake and output will improve Outcome: Progressing Pt encouraged to drink fluids. Reminded of need to measure urine ouptut

## 2015-02-27 NOTE — Clinical Documentation Improvement (Signed)
Internal Medicine  Can the diagnosis of CHF be further specified?    Acuity - Acute, Chronic, Acute on Chronic   Type - Systolic, Diastolic, Systolic and Diastolic  Other  Clinically Undetermined  Supporting Information:  H&P: "Chronic history of CHF"   Please exercise your independent, professional judgment when responding. A specific answer is not anticipated or expected.   Thank You,  Rolm Gala, RN, [place] [phone]

## 2015-02-27 NOTE — Plan of Care (Signed)
Problem: Fluid Volume: Goal: Hemodynamic stability will improve VSS

## 2015-02-27 NOTE — Plan of Care (Signed)
Problem: Physical Regulation: Goal: Will remain free from infection Outcome: Progressing IV antibiotics given.  Encouraged good hygiene.

## 2015-02-27 NOTE — Plan of Care (Signed)
Problem: Tissue Perfusion: Goal: Risk factors for ineffective tissue perfusion will decrease Lovenox ordered.  Pt encouraged to do calf pumps and ambulate.

## 2015-02-27 NOTE — Plan of Care (Signed)
Problem: Pain Managment: Goal: General experience of comfort will improve Outcome: Progressing Pt is having pain generalized.  Encouraged to move around in room and sit in chair.  Pain med given once with good relief.

## 2015-02-27 NOTE — Progress Notes (Signed)
[facility]  SUBJECTIVE: still weak when attempting to stand up. Less sob. No chest pain   Filed Vitals:   02/26/15 2155 02/27/15 0505 02/27/15 0720 02/27/15 1116  BP:  161/65 155/61 151/64  Pulse:  71 69 73  Temp: 99.8 F (37.7 C) 98.1 F (36.7 C) 98 F (36.7 C) 98 F (36.7 C)  TempSrc: Oral Oral Oral Oral  Resp:  18 18 18   Height:      Weight:      SpO2:  97% 96% 97%    Intake/Output Summary (Last 24 hours) at 02/27/15 1257 Last data filed at 02/27/15 1233  Gross per 24 hour  Intake    480 ml  Output   2100 ml  Net  -1620 ml    LABS: Basic Metabolic Panel:  Recent Labs  02/26/15 0448 02/27/15 0213  NA 138 140  K 3.6 3.7  CL 108 107  CO2 26 26  GLUCOSE 208* 158*  BUN 26* 25*  CREATININE 1.68* 1.72*  CALCIUM 7.9* 8.4*   Liver Function Tests:  Recent Labs  02/25/15 1204 02/26/15 0448  AST 96* 50*  ALT 55 42  ALKPHOS 251* 209*  BILITOT 3.4* 2.6*  PROT 6.3* 6.2*  ALBUMIN 2.6* 2.4*    Recent Labs  02/25/15 1204  LIPASE 33   CBC:  Recent Labs  02/25/15 1204 02/26/15 0448  WBC 16.6* 8.0  HGB 9.3* 8.7*  HCT 29.2* 27.1*  MCV 86.4 87.3  PLT 236 201   Cardiac Enzymes:  Recent Labs  02/25/15 1730 02/25/15 2301 02/26/15 0448  TROPONINI 0.07* 0.06* 0.07*   BNP: Invalid input(s): POCBNP D-Dimer: No results for input(s): DDIMER in the last 72 hours. Hemoglobin A1C:  Recent Labs  02/26/15 0448  HGBA1C 7.5*   Fasting Lipid Panel: No results for input(s): CHOL, HDL, LDLCALC, TRIG, CHOLHDL, LDLDIRECT in the last 72 hours. Thyroid Function Tests: No results for input(s): TSH, T4TOTAL, T3FREE, THYROIDAB in the last 72 hours.  Invalid input(s): FREET3 Anemia Panel: No results for input(s): VITAMINB12, FOLATE, FERRITIN, TIBC, IRON, RETICCTPCT in the last 72 hours.   Physical Exam: Blood pressure 151/64, pulse 73, temperature 98 F (36.7 C), temperature source Oral, resp. rate 18, height 5\' 9"   (1.753 m), weight 91.491 kg (201 lb 11.2 oz), SpO2 97 %.  General appearance: alert and cooperative Resp: clear to auscultation bilaterally Cardio: regular rate and rhythm GI: soft, non-tender; bowel sounds normal; no masses,  no organomegaly Extremities: extremities normal, atraumatic, no cyanosis or edema Neurologic: Motor: grade 4 gluteus maximus bilaterally  TELEMETRY: Reviewed telemetry pt in nsr:  ASSESSMENT AND PLAN:  Active Problems:   Sepsis (HCC)-on apprpriate abx Does not appear to have cardiac source of lower extremety weakness. No active chf noted.     Teodoro Spray., MD, [facility] 02/27/2015 12:57 PM

## 2015-02-27 NOTE — Progress Notes (Signed)
Inpatient Diabetes Program Recommendations  AACE/ADA: New Consensus Statement on Inpatient Glycemic Control (2015)  Target Ranges:  Prepandial:   less than 140 mg/dL      Peak postprandial:   less than 180 mg/dL (1-2 hours)      Critically ill patients:  140 - 180 mg/dL   Review of Glycemic Control Results for [name], [name] (MRN [id number]) as of 02/27/2015 10:53  Ref. Range 02/25/2015 20:22 02/26/2015 11:10 02/26/2015 16:28 02/26/2015 20:40 02/27/2015 07:24  Glucose-Capillary Latest Ref Range: 65-99 mg/dL 245 (H) 269 (H) 190 (H) 204 (H) 149 (H)    Diabetes history: Type 2, A1C is 7.5% Outpatient Diabetes medications: Amaryl 4mg  /day, Lantus 40 units bid, Novolin R 5-15 units tid with meals. Current orders for Inpatient glycemic control: Lantus 30 units bid,  Novolog correction 0-5 units qhs, Novolog correction insulin 0-9 units tid with meals  Inpatient Diabetes Program Recommendations:   Consider adding Novolog 2units tid with meals (hold if he eats less than 50% of his meal)- continue Novolog correction insulin (SSI) as ordered.  Gentry Fitz, RN, BA, MHA, CDE Diabetes Coordinator Inpatient Diabetes Program  [phone] (Team Pager) [phone] ([place]) 02/27/2015 10:55 AM

## 2015-02-27 NOTE — Plan of Care (Signed)
Problem: Education: Goal: Knowledge of [facility] Education information/materials will improve Outcome: Progressing Pt and family verbalized understanding of general eductation involving pain management, pain medications, and disease process.  Discussed pancreatic treatment with oncologist and asked questions after md left room.

## 2015-02-27 NOTE — Progress Notes (Signed)
[place] at [place] NAME: [name]    MR#:  [id number]  DATE OF BIRTH:  03/30/1941  SUBJECTIVE:  CHIEF COMPLAINT:   Chief Complaint  Patient presents with  . Weakness   Generalized weakness. REVIEW OF SYSTEMS:  CONSTITUTIONAL: No fever but has generalized weakness.  EYES: No blurred or double vision.  EARS, NOSE, AND THROAT: No tinnitus or ear pain.  RESPIRATORY: No cough, shortness of breath, wheezing or hemoptysis.  CARDIOVASCULAR: No chest pain, orthopnea, edema.  GASTROINTESTINAL: No nausea, vomiting, diarrhea or abdominal pain.  GENITOURINARY: No dysuria, hematuria.  ENDOCRINE: No polyuria, nocturia,  HEMATOLOGY: No anemia, easy bruising or bleeding SKIN: No rash or lesion. MUSCULOSKELETAL: No joint pain or arthritis.   NEUROLOGIC: No tingling, numbness, weakness.  PSYCHIATRY: No anxiety or depression.   DRUG ALLERGIES:   Allergies  Allergen Reactions  . Zithromax [Azithromycin] Anaphylaxis and Other (See Comments)    Reaction:  Stroke-like symptoms   . Lisinopril Other (See Comments)    Per spouse, pt did not tolerate well in the past.      VITALS:  Blood pressure 151/64, pulse 73, temperature 98 F (36.7 C), temperature source Oral, resp. rate 18, height 5\' 9"  (1.753 m), weight 91.491 kg (201 lb 11.2 oz), SpO2 97 %.  PHYSICAL EXAMINATION:  GENERAL:  74 y.o.-year-old patient lying in the bed with no acute distress.  EYES: Pupils equal, round, reactive to light and accommodation. No scleral icterus. Extraocular muscles intact.  HEENT: Head atraumatic, normocephalic. Oropharynx and nasopharynx clear. Moist oral mucosa. NECK:  Supple, no jugular venous distention. No thyroid enlargement, no tenderness.  LUNGS: Normal breath sounds bilaterally, no wheezing, rales,rhonchi or crepitation. No use of accessory muscles of respiration.  CARDIOVASCULAR: S1, S2 normal. No murmurs, rubs, or gallops.  ABDOMEN: Soft,  nontender, nondistended. Bowel sounds present. No organomegaly or mass.  EXTREMITIES: No pedal edema, cyanosis, or clubbing.  NEUROLOGIC: Cranial nerves II through XII are intact. Muscle strength 5/5 in all extremities. Sensation intact. Gait not checked.  PSYCHIATRIC: The patient is alert and oriented x 3.  SKIN: No obvious rash, lesion, or ulcer.    LABORATORY PANEL:   CBC  Recent Labs Lab 02/26/15 0448  WBC 8.0  HGB 8.7*  HCT 27.1*  PLT 201   ------------------------------------------------------------------------------------------------------------------  Chemistries   Recent Labs Lab 02/26/15 0448 02/27/15 0213  NA 138 140  K 3.6 3.7  CL 108 107  CO2 26 26  GLUCOSE 208* 158*  BUN 26* 25*  CREATININE 1.68* 1.72*  CALCIUM 7.9* 8.4*  AST 50*  --   ALT 42  --   ALKPHOS 209*  --   BILITOT 2.6*  --    ------------------------------------------------------------------------------------------------------------------  Cardiac Enzymes  Recent Labs Lab 02/26/15 0448  TROPONINI 0.07*   ------------------------------------------------------------------------------------------------------------------  RADIOLOGY:  No results found.  EKG:   Orders placed or performed during the hospital encounter of 02/25/15  . ED EKG  . ED EKG    ASSESSMENT AND PLAN:   1. Sepsis with bacteremia. Blood culture: Klebsiella pneumonia. discontinue IV Zosyn and vancomycin, start Rocephin and then changed to by mouth Keflex for 10 days according to Dr. Ola Spurr.   2. History of coronary artery disease status post CABG with elevated troponin-probably from demand ischemia and acute renal insufficiency Continue aspirin and statin, no ischemic workup per Dr. Ubaldo Glassing.  3. Acute kidney injury on chronic renal insufficiency Improving with IV fluid, discontinued IVF, continue Demadex.  4.  Chronic history of congestive heart failure. Stable. Continue Demadex.  5. Insulin requiring  diabetes mellitus Not well controlled, continue 30 units of Lantus twice a day at home and continue sliding scale insulin.  6. Recent diagnosis of pancreatic cancer  No PET or chemo per Dr. Ignatius Specking.  PT evaluation.  All the records are reviewed and case discussed with Care Management/Social Workerr. Management plans discussed with the patient, his wife and they are in agreement. Greater than 50% time was spent on coordination of care and face-to-face counseling.  CODE STATUS: Full code  TOTAL TIME TAKING CARE OF THIS PATIENT: 42 minutes.   POSSIBLE D/C IN 2 DAYS, DEPENDING ON CLINICAL CONDITION.   Demetrios Loll M.D on 02/27/2015 at 3:35 PM  Between 7am to 6pm - Pager - [phone]  After 6pm go to [email or web address] - password [email or web address] [facility]  [place] [facility]  Office  [phone]  CC: Primary care physician; Rusty Aus., MD

## 2015-02-27 NOTE — Consult Note (Signed)
[facility] CONSULT NOTE  Patient Care Team: Rusty Aus, MD as PCP - General (Unknown Physician Specialty) Clent Jacks, RN as Registered Nurse  CHIEF COMPLAINTS/PURPOSE OF CONSULTATION:  Pancreatic cancer  HISTORY OF PRESENTING ILLNESS:  [name] 74 y.o.  male with  History of CAD; and also recent diagnosis of stage II pancreatic cancer supposed to follow-up with me in the clinic today for further treatment plan; currently admitted to the hospital for sepsis/bacteremia.  As per the wife patient had been getting progressively weaker over the last few days; he had low-grade temperature; and also chills.he was also confused. Blood cultures- 1 out of 2 gram-negative rods. He is appropriately on broad-spectrum antibiotics. Also his creatinine was around 2 [baseline around 1]. His bilirubin had dropped from from 13 towards the end of October; after ERCP to 2.5-3 at this admission.  Since being in hospital his mentation has improved. He feels improved.however he continues to have generalized weakness.  ROS: A complete 10 point review of system is done which is negative except mentioned above in history of present illness  MEDICAL HISTORY:  Past Medical History  Diagnosis Date  . Hyperlipidemia   . H/O cardiac catheterization 06/03/06,12/03/11  . BPH (benign prostatic hypertrophy)   . Anxiety   . Syncope     LAUGHING INDUCED  . Myocardial infarction ([place])     x 2  . CHF (congestive heart failure) ([place])   . CAD (coronary artery disease) 06/03/06    cypher 2.5 x 12mm DES for 80% mid RCA sees Dr. Ubaldo Glassing  . Asthma     as a child  . Sleep apnea     hx of  . Kidney stone     hx of  . GERD (gastroesophageal reflux disease)     on medication for  . Cancer ([place])     hx of skin ca in "corner of left eye"  . Arthritis   . Anemia     taking iron  . Cataract, bilateral   . Neuromuscular disorder ([place])     diabetic neuropathy  . Diabetes mellitus without  complication ([place])     SURGICAL HISTORY: Past Surgical History  Procedure Laterality Date  . Spine surgery  9/05    CERVICAL LAMINECTOMY  . Cervical herniated    . Eye surgery      as a child  . Coronary artery bypass graft  12/12/2011    Procedure: CORONARY ARTERY BYPASS GRAFTING (CABG);  Surgeon: Gaye Pollack, MD;  Location: [facility];  Service: Open Heart Surgery;  Laterality: N/A;  coronary artery bypass graft times four using left internal mammary artery, right leg greater saphenous vein harvested endoscopically  . Cardiac catheterization N/A 10/26/2014    Procedure: Left Heart Cath;  Surgeon: Isaias Cowman, MD;  Location: [facility];  Service: Cardiovascular;  Laterality: N/A;  . Endoscopic retrograde cholangiopancreatography (ercp) with propofol N/A 02/13/2015    Procedure: ENDOSCOPIC RETROGRADE CHOLANGIOPANCREATOGRAPHY (ERCP) WITH PROPOFOL;  Surgeon: Lucilla Lame, MD;  Location: [facility];  Service: Endoscopy;  Laterality: N/A;  . Eus N/A 02/23/2015    Procedure: UPPER ENDOSCOPIC ULTRASOUND (EUS) LINEAR;  Surgeon: Holly Bodily, MD;  Location: [facility];  Service: Gastroenterology;  Laterality: N/A;    SOCIAL HISTORY: Social History   Social History  . Marital Status: Married    Spouse Name: N/A  . Number of Children: N/A  . Years of Education: N/A   Occupational History  .  Not on file.   Social History Main Topics  . Smoking status: Former Smoker    Types: Cigarettes, Cigars    Quit date: 12/03/1977  . Smokeless tobacco: Never Used  . Alcohol Use: No  . Drug Use: No  . Sexual Activity: Not on file   Other Topics Concern  . Not on file   Social History Narrative   Lives with his wife    FAMILY HISTORY: Family History  Problem Relation Age of Onset  . Diabetes Mellitus II Mother     Also Brother and sister  . Pancreatic cancer Mother   . Leukemia Father   . Diabetes Mellitus II Sister   . Diabetes Mellitus II Brother      ALLERGIES:  is allergic to zithromax and lisinopril.  MEDICATIONS:  Current Facility-Administered Medications  Medication Dose Route Frequency Provider Last Rate Last Dose  . acetaminophen (TYLENOL) tablet 650 mg  650 mg Oral BID PRN Nicholes Mango, MD   650 mg at 02/26/15 2057  . aspirin EC tablet 325 mg  325 mg Oral Daily Nicholes Mango, MD   325 mg at 02/27/15 0858  . docusate sodium (COLACE) capsule 100 mg  100 mg Oral BID Nicholes Mango, MD   100 mg at 02/27/15 0851  . enoxaparin (LOVENOX) injection 40 mg  40 mg Subcutaneous Q24H Nicholes Mango, MD   40 mg at 02/26/15 1803  . gabapentin (NEURONTIN) capsule 300 mg  300 mg Oral BID Nicholes Mango, MD   300 mg at 02/27/15 0853  . insulin aspart (novoLOG) injection 0-5 Units  0-5 Units Subcutaneous QHS Nicholes Mango, MD   2 Units at 02/26/15 2057  . insulin aspart (novoLOG) injection 0-9 Units  0-9 Units Subcutaneous TID WC Nicholes Mango, MD   1 Units at 02/27/15 0852  . insulin glargine (LANTUS) injection 30 Units  30 Units Subcutaneous BID Demetrios Loll, MD   30 Units at 02/27/15 [phone]  . oxyCODONE (Oxy IR/ROXICODONE) immediate release tablet 5 mg  5 mg Oral Q4H PRN Nicholes Mango, MD   5 mg at 02/27/15 1003  . pantoprazole (PROTONIX) EC tablet 40 mg  40 mg Oral Daily Nicholes Mango, MD   40 mg at 02/27/15 0851  . piperacillin-tazobactam (ZOSYN) IVPB 3.375 g  3.375 g Intravenous 3 times per day Ramond Dial, RPH   3.375 g at 02/27/15 0608  . pravastatin (PRAVACHOL) tablet 20 mg  20 mg Oral q1800 Nicholes Mango, MD   20 mg at 02/26/15 1804  . sodium chloride 0.9 % injection 3 mL  3 mL Intravenous Q12H Nicholes Mango, MD   3 mL at 02/27/15 0911  . torsemide (DEMADEX) tablet 10 mg  10 mg Oral QPM Nicholes Mango, MD   10 mg at 02/26/15 1803  . torsemide (DEMADEX) tablet 20 mg  20 mg Oral q morning - 10a Nicholes Mango, MD   20 mg at 02/27/15 0851  . vancomycin (VANCOCIN) 1,250 mg in sodium chloride 0.9 % 250 mL IVPB  1,250 mg Intravenous Q24H Ramond Dial, RPH   1,250 mg at  02/26/15 1535      .  PHYSICAL EXAMINATION:  Filed Vitals:   02/27/15 1116  BP: 151/64  Pulse: 73  Temp: 98 F (36.7 C)  Resp: 18   Filed Weights   02/25/15 1145 02/25/15 1704  Weight: 207 lb 8 oz (94.121 kg) 201 lb 11.2 oz (91.491 kg)    GENERAL: Well-nourished well-developed; Alert, no distress and comfortable.  Accompanied by his wife and daughter. EYES: no pallor; positive for mild icterus OROPHARYNX: no thrush or ulceration;poor dentition NECK: supple, no masses felt LYMPH:  no palpable lymphadenopathy in the cervical, axillary or inguinal regions LUNGS: bilateral basilar crackles  No wheeze.  HEART/CVS: regular rate & rhythm and no murmurs; No lower extremity edema ABDOMEN: abdomen soft, non-tender and normal bowel sounds Musculoskeletal:no cyanosis of digits and no clubbing  PSYCH: alert & oriented x 3 with fluent speech NEURO: no focal motor/sensory deficits SKIN:  no rashes or significant lesions  LABORATORY DATA:  I have reviewed the data as listed Lab Results  Component Value Date   WBC 8.0 02/26/2015   HGB 8.7* 02/26/2015   HCT 27.1* 02/26/2015   MCV 87.3 02/26/2015   PLT 201 02/26/2015    Recent Labs  02/22/15 1255 02/25/15 1204 02/26/15 0448 02/27/15 0213  NA 135 139 138 140  K 3.9 3.7 3.6 3.7  CL 102 107 108 107  CO2 27 25 26 26   GLUCOSE 217* 122* 208* 158*  BUN 23* 26* 26* 25*  CREATININE 1.65* 2.02* 1.68* 1.72*  CALCIUM 7.7* 8.0* 7.9* 8.4*  GFRNONAA 39* 31* 38* 37*  GFRAA 46* 36* 45* 43*  PROT 7.2 6.3* 6.2*  --   ALBUMIN 3.0* 2.6* 2.4*  --   AST 41 96* 50*  --   ALT 43 55 42  --   ALKPHOS 197* 251* 209*  --   BILITOT 3.1* 3.4* 2.6*  --   BILIDIR  --  1.8*  --   --   IBILI  --  1.6*  --   --     RADIOGRAPHIC STUDIES: I have personally reviewed the radiological images as listed and agreed with the findings in the report. Ct Abdomen Wo Contrast  02/10/2015  CLINICAL DATA:  Elevated LFTs and bilirubin, jaundice EXAM: CT ABDOMEN  WITHOUT CONTRAST TECHNIQUE: Multidetector CT imaging of the abdomen was performed following the standard protocol without IV contrast. Sagittal and coronal MPR images reconstructed from axial data set. Patient drank dilute oral contrast for exam. COMPARISON:  None. FINDINGS: Minimal atelectasis dependently LEFT lower lobe. Liver, spleen, kidneys and adrenal glands unremarkable for noncontrast technique. Normal appendix. Heterogeneous region of low attenuation at the pancreatic body extending into the proximal tail could represent focal pancreatitis though a more focal area of low attenuation 21 x 17 mm is identified, cannot exclude tumor. No definite biliary dilatation, CBD 12 mm diameter. No definite biliary tract calcifications. Tiny hiatal hernia. Visualized bowel loops unremarkable. Scattered atherosclerotic calcifications. No additional mass, adenopathy free air or free fluid. IMPRESSION: Abnormal appearance to the pancreatic body which could be related to focal pancreatitis though mass/ tumor not excluded ; MR imaging recommended to exclude pancreatic neoplasm. Mild biliary dilatation, can evaluate by MRCP sequence at time of MR imaging. Electronically Signed   By: Lavonia Dana M.D.   On: 02/10/2015 16:26   Dg Chest 2 View  02/25/2015  CLINICAL DATA:  74 year old male with a history of weakness and dizziness EXAM: CHEST - 2 VIEW COMPARISON:  01/22/2015, 10/26/2014 FINDINGS: Cardiomediastinal silhouette unchanged, with borderline cardiomegaly. Atherosclerotic calcifications of the aortic arch. Surgical changes of median sternotomy. Surgical changes of the cervical region again noted. Right cervical rib. Indistinctness of the central vasculature, particularly on the lateral view with interlobular septal opacities. No pleural effusion.  No confluent airspace disease. No displaced fracture. Unremarkable appearance of the upper abdomen. IMPRESSION: Low lung volumes, with appearance of developing pulmonary  vascular congestion/ early edema. No lobar pneumonia or pleural effusion. Surgical changes of prior median sternotomy. Atherosclerosis. Signed, Dulcy Fanny. Earleen Newport, DO [facility] Radiology Electronically Signed   By: Corrie Mckusick D.O.   On: 02/25/2015 12:30   Ct Head Wo Contrast  02/10/2015  CLINICAL DATA:  Altered mental status EXAM: CT HEAD WITHOUT CONTRAST TECHNIQUE: Contiguous axial images were obtained from the base of the skull through the vertex without intravenous contrast. COMPARISON:  None. FINDINGS: Motion degraded images. No evidence of parenchymal hemorrhage or extra-axial fluid collection. No mass lesion, mass effect, or midline shift. No CT evidence of acute infarction. Mild cortical atrophy.  No ventriculomegaly. The visualized paranasal sinuses are essentially clear. The mastoid air cells are unopacified. No evidence of calvarial fracture. IMPRESSION: Motion degraded images. No evidence of acute intracranial abnormality. Mild cortical atrophy. Electronically Signed   By: Julian Hy M.D.   On: 02/10/2015 20:27   Ct Chest Wo Contrast  02/25/2015  CLINICAL DATA:  74 year old male with weakness and chest pain over the past 2 weeks as well as shortness of breath on exertion. EXAM: CT CHEST WITHOUT CONTRAST TECHNIQUE: Multidetector CT imaging of the chest was performed following the standard protocol without IV contrast. COMPARISON:  Chest x-ray earlier today ; recent prior CT of the abdomen and pelvis including the lung bases 02/14/2015 FINDINGS: Mediastinum: Unremarkable CT appearance of the thyroid gland. No suspicious mediastinal or hilar adenopathy. No soft tissue mediastinal mass. The thoracic esophagus is unremarkable. Heart/Vascular: Limited evaluation in the absence of intravenous contrast. Atherosclerotic calcifications noted throughout the transverse aorta and coronary arteries. Patient is status post median sternotomy with evidence  of prior multivessel CABG including LIMA bypass. Mild cardiomegaly. No pericardial effusion. No aortic aneurysm. Normal caliber main pulmonary artery. Lungs/Pleura: Mild respiratory motion artifact. Mild dependent atelectasis. No focal airspace consolidation, evidence of pulmonary edema or suspicious pulmonary mass or nodule. Mild plaque-like calcification of the posterior pleura on the left. Healed median sternotomy. Bones/Soft Tissues: No acute fracture or aggressive appearing lytic or blastic osseous lesion. Upper Abdomen: Visualized upper abdominal organs are unremarkable. Expected pneumobilia given history of a biliary stent. Otherwise, unremarkable visualized upper abdomen. IMPRESSION: 1. No acute abnormality on this noncontrast CT scan of the chest. 2. Stable cardiomegaly with surgical changes of multivessel CABG. 3. Mild dependent atelectasis. 4. Plaque-like calcifications in the posterior pleural on the left. Electronically Signed   By: Jacqulynn Cadet M.D.   On: 02/25/2015 13:36   Ct Abdomen Pelvis W Contrast  02/14/2015  CLINICAL DATA:  Diabetic with weakness and elevated bilirubin. Abnormal pancreas on recent noncontrast CT. EXAM: CT ABDOMEN AND PELVIS WITH CONTRAST TECHNIQUE: Multidetector CT imaging of the abdomen and pelvis was performed using the standard protocol following bolus administration of intravenous contrast. CONTRAST:  175mL OMNIPAQUE IOHEXOL 300 MG/ML  SOLN COMPARISON:  Noncontrast abdominal CT 02/10/2015. FINDINGS: Lower chest: Mild emphysematous changes and subpleural reticulation are noted at both lung bases. The heart is enlarged status post median sternotomy. There is diffuse atherosclerosis of the coronary arteries and thoracic aorta. There are trace left-greater-than-right pleural effusions. Hepatobiliary: The liver demonstrates mildly decreased density consistent with steatosis. No focal hepatic abnormalities are identified. There is pneumobilia status post interval plastic  biliary stent placement. The stent appears well positioned. The gallbladder is decompressed. Pancreas: The pancreatic duct is mildly dilated to 8 mm. There is mild soft tissue stranding around the pancreas suspicious for inflammation. Low-density lesions within the pancreatic head are  again noted, measuring 13 mm on image 32 and 20 mm on image 35. These are similar to the recent noncontrast study. There is also some low density in the pancreatic tail, best seen on the reformatted images. No enhancing parenchymal lesion identified. Spleen: Normal in size without focal abnormality. Adrenals/Urinary Tract: Both adrenal glands appear normal. There is no evidence of urinary tract calculus, hydronephrosis or renal mass. The right kidney demonstrates mild cortical thinning and a small cyst in its interpolar region. The left kidney appears normal. No bladder abnormality seen. Stomach/Bowel: No evidence of bowel wall thickening, distention or surrounding inflammatory change. The appendix appears normal. Vascular/Lymphatic: Prominent lymph nodes in the upper abdomen are likely reactive, including a 15 mm node in the gastrohepatic ligament on image 27 and a 12 mm portacaval node on image 34. There is no retroperitoneal lymphadenopathy. There is moderate atherosclerosis of the aorta, its branches and the iliac arteries. Reproductive: Mild enlargement of the prostate gland. Other: There is a small amount of ascites with retroperitoneal edema tracking along the pararenal spaces bilaterally. There is asymmetric fat in the left inguinal canal. Musculoskeletal: No acute or significant osseous findings. IMPRESSION: 1. Interval biliary stent placement with resulting pneumobilia and decompression of the biliary system and gallbladder. 2. Nonspecific low-density lesions within the pancreatic head and tail with persistent mild pancreatic ductal dilatation. Findings are favored to reflect acute pancreatitis with small pseudocysts,  although cystic neoplasm cannot be excluded by this examination. Correlation with prior ERCP results recommended. 3. Imaging follow-up after the presumed acute inflammatory changes have resolved is recommended (2-4 months, preferably with MRI). Electronically Signed   By: Richardean Sale M.D.   On: 02/14/2015 16:39   US Carotid Bilateral  02/10/2015  CLINICAL DATA:  Syncope and history of diabetes, coronary artery disease and prior CABG. EXAM: BILATERAL CAROTID DUPLEX ULTRASOUND TECHNIQUE: Pearline Cables scale imaging, color Doppler and duplex ultrasound were performed of bilateral carotid and vertebral arteries in the neck. COMPARISON:  None. FINDINGS: Criteria: Quantification of carotid stenosis is based on velocity parameters that correlate the residual internal carotid diameter with NASCET-based stenosis levels, using the diameter of the distal internal carotid lumen as the denominator for stenosis measurement. The following velocity measurements were obtained: RIGHT ICA:  73/12 cm/sec CCA:  67/1 cm/sec SYSTOLIC ICA/CCA RATIO:  1.1 DIASTOLIC ICA/CCA RATIO:  1.3 ECA:  164 cm/sec LEFT ICA:  105/18 cm/sec CCA:  24/58 cm/sec SYSTOLIC ICA/CCA RATIO:  1.2 DIASTOLIC ICA/CCA RATIO:  1.0 ECA:  147 cm/sec RIGHT CAROTID ARTERY: Mild amount of calcified plaque is present at the level of the distal bulb and proximal ICA. Velocities and waveforms are normal and estimated right ICA stenosis is less than 50%. RIGHT VERTEBRAL ARTERY: Antegrade flow with normal waveform and velocity. LEFT CAROTID ARTERY: There is a mild amount of calcified plaque at the level of the left carotid bulb and proximal ICA. Overall plaque volume is slightly greater on the left compared to the right. Velocities and waveforms are normal and estimated left ICA stenosis is less than 50%. LEFT VERTEBRAL ARTERY: Antegrade flow with normal waveform and velocity. IMPRESSION: Mild amount of plaque at the level of both carotid bulbs and proximal internal carotid  arteries, left greater than right. No significant carotid stenosis is identified with estimated bilateral ICA stenoses of less than 50%. Electronically Signed   By: Aletta Edouard M.D.   On: 02/10/2015 17:15    ASSESSMENT & PLAN:   74 year old male patient with newly diagnosed pancreatic cancer stage  II; recent biliary obstruction status post ERCP and stent currently admitted to the hospital for generalized weakness/chills/fever  # generalized weakness chills and fever- from bacteremia [gram-negative rods]/sepsis- patient on Zosyn and vancomycin  # pancreatic cancer- stage II based on EUS [T3 N0]; however has abutment of the SMV. CT scan chest done in the hospital negative for any metastases.  # recent painless jaundice/obstructive jaundice status post ERCP stent placement. Bilirubin yesterday 2.6.  Recommendations:   # Agree with medical management of gram-negative sepsis. However given his recent biliary obstruction status post ERCP and stent placement I recommended GI evaluation.  # I had a long discussion with the patient and his family [wife and daughter]- the overall grave prognosis of pancreas cancer even though it stage II. In order for cure- we will have to go through a major surgery like Whipple's. I had spoken to Dr. Sharen Counter at [facility]. He is awaiting an evaluation at [facility]. However he and his family understands that this is a major surgery- and only chance for cure. As per my discussion with Dr. Sharen Counter- patient is recommended neoadjuvant chemotherapy prior to surgery. Patient given her current bacteremia/mild acute renal failure/history of CAD- I'm concerned about aggressive treatment with FOLFIRINOX. I suspect patient may tolerate gemcitabine and Abraxane better than FOLFIRINOX.   # patient will need a port; which again cannot be placed and is having bacteremia currently. He and his family understands that it would beat least 2 weeks or so before we can start any chemotherapy.    All  questions were answered. The patient knows to call the clinic with any problems, questions or concerns. I have paged Dr. Bridgett Larsson to discuss my plan.  I spent 40 minutes counseling the patient face to face. The total time spent in the appointment was 60 minutes and more than 50% was on counseling.     Cammie Sickle, MD 02/27/2015 12:23 PM

## 2015-02-27 NOTE — Consult Note (Addendum)
[facility]     Reason for Consult: GNR bacteremia    Referring Physician: Gouru Date of Admission:  02/25/2015   Active Problems:   Sepsis ([name])   HPI: [name] is a 74 y.o. male with diabetes mellitus, coronary artery disease status post 4 vessel CABG, recently diagnosed with pancreatic cancer via EUS admitted with chills, rigors and generalized weakness.  Was hypotensive, febrile and had elevated lactic acid wbc 16, cr 2. .  Started on broad spectrum abx. BCx + for [employer]. WBC has improved, fever curve improving.  CXR neg, CT neg of chest.  Min abd pain. No  nv/d   Past Medical History  Diagnosis Date  . Hyperlipidemia   . H/O cardiac catheterization 06/03/06,12/03/11  . BPH (benign prostatic hypertrophy)   . Anxiety   . Syncope     LAUGHING INDUCED  . Myocardial infarction ([place])     x 2  . CHF (congestive heart failure) ([place])   . CAD (coronary artery disease) 06/03/06    cypher 2.5 x 12m DES for 80% mid RCA sees Dr. FUbaldo Glassing . Asthma     as a child  . Sleep apnea     hx of  . Kidney stone     hx of  . GERD (gastroesophageal reflux disease)     on medication for  . [place]     hx of skin ca in "corner of left eye"  . Arthritis   . Anemia     taking iron  . Cataract, bilateral   . Neuromuscular disorder ([place]     diabetic neuropathy  . Diabetes mellitus without complication ([facility]    Past Surgical History  Procedure Laterality Date  . Spine surgery  9/05    CERVICAL LAMINECTOMY  . Cervical herniated    . Eye surgery      as a child  . Coronary artery bypass graft  12/12/2011    Procedure: CORONARY ARTERY BYPASS GRAFTING (CABG);  Surgeon: BGaye Pollack MD;  [place] [facility]  Service: Open Heart Surgery;  Laterality: N/A;  coronary artery bypass graft times four using left internal mammary artery, right leg greater saphenous vein harvested endoscopically  . Cardiac catheterization N/A 10/26/2014    Procedure: Left Heart Cath;   Surgeon: AIsaias Cowman MD;  [facility];  Service: Cardiovascular;  Laterality: N/A;  . Endoscopic retrograde cholangiopancreatography (ercp) with propofol N/A 02/13/2015    Procedure: ENDOSCOPIC RETROGRADE CHOLANGIOPANCREATOGRAPHY (ERCP) WITH PROPOFOL;  Surgeon: DLucilla Lame MD;  Location: [facility] ENDOSCOPY;  Service: Endoscopy;  Laterality: N/A;  . Eus N/A 02/23/2015    Procedure: UPPER ENDOSCOPIC ULTRASOUND (EUS) LINEAR;  Surgeon: RHolly Bodily MD;  Location: [facility] ENDOSCOPY;  Service: Gastroenterology;  Laterality: N/A;   Social History  Substance Use Topics  . Smoking status: Former Smoker    Types: Cigarettes, Cigars    Quit date: 12/03/1977  . Smokeless tobacco: Never Used  . Alcohol Use: No   Family History  Problem Relation Age of Onset  . Diabetes Mellitus II Mother     Also Brother and sister  . Pancreatic cancer Mother   . Leukemia Father   . Diabetes Mellitus II Sister   . Diabetes Mellitus II Brother     Allergies:  Allergies  Allergen Reactions  . Zithromax [Azithromycin] Anaphylaxis and Other (See Comments)    Reaction:  Stroke-like symptoms   . Lisinopril Other (See Comments)    Per spouse, pt  did not tolerate well in the past.      Current antibiotics: Antibiotics Given (last 72 hours)    Date/Time Action Medication Dose Rate   02/25/15 2125 Given   piperacillin-tazobactam (ZOSYN) IVPB 3.375 g 3.375 g 12.5 mL/hr   02/26/15 0535 Given   piperacillin-tazobactam (ZOSYN) IVPB 3.375 g 3.375 g 12.5 mL/hr   02/26/15 1238 Given   piperacillin-tazobactam (ZOSYN) IVPB 3.375 g 3.375 g 12.5 mL/hr   02/26/15 1535 Given   vancomycin (VANCOCIN) 1,250 mg in sodium chloride 0.9 % 250 mL IVPB 1,250 mg 166.7 mL/hr   02/26/15 2057 Given   piperacillin-tazobactam (ZOSYN) IVPB 3.375 g 3.375 g 12.5 mL/hr   02/27/15 9629 Given   piperacillin-tazobactam (ZOSYN) IVPB 3.375 g 3.375 g 12.5 mL/hr   02/27/15 1225 Given   piperacillin-tazobactam (ZOSYN)  IVPB 3.375 g 3.375 g 12.5 mL/hr      MEDICATIONS: . aspirin EC  325 mg Oral Daily  . cefTRIAXone (ROCEPHIN)  IV  2 g Intravenous Q24H  . docusate sodium  100 mg Oral BID  . gabapentin  300 mg Oral BID  . insulin aspart  0-5 Units Subcutaneous QHS  . insulin aspart  0-9 Units Subcutaneous TID WC  . insulin glargine  30 Units Subcutaneous BID  . pantoprazole  40 mg Oral Daily  . pravastatin  20 mg Oral q1800  . sodium chloride  3 mL Intravenous Q12H  . torsemide  10 mg Oral QPM  . torsemide  20 mg Oral q morning - 10a    Review of Systems - 11 systems reviewed and negative per HPI   OBJECTIVE: Temp:  [98 F (36.7 C)-100.8 F (38.2 C)] 98 F (36.7 C) (11/07 1116) Pulse Rate:  [69-77] 73 (11/07 1116) Resp:  [16-18] 18 (11/07 1116) BP: (151-161)/(59-65) 151/64 mmHg (11/07 1116) SpO2:  [96 %-98 %] 97 % (11/07 1116) Physical Exam  Constitutional: He is oriented to person, place, and time. He appears well-developed and well-nourished. No distress. obese HENT:  Mouth/Throat: Oropharynx is clear and moist. No oropharyngeal exudate.  Cardiovascular: Normal rate, regular rhythm and normal heart sounds. Exam reveals no gallop and no friction rub.  No murmur heard.  Pulmonary/Chest: Effort normal and breath sounds normal. No respiratory distress. He has no wheezes.  Abdominal: Soft. Bowel sounds are normal. Obese, mild ttp over xyphoid process Lymphadenopathy:  He has no cervical adenopathy.  Neurological: He is alert and oriented to person, place, and time.  Skin: Skin is warm and dry. No rash noted. No erythema.  Psychiatric: He has a normal mood and affect. His behavior is normal.   LABS: Results for orders placed or performed during the hospital encounter of 02/25/15 (from the past 48 hour(s))  Lactic acid, plasma     Status: None   Collection Time: 02/25/15  3:56 PM  Result Value Ref Range   Lactic Acid, Venous 2.0 0.5 - 2.0 mmol/L  Troponin I     Status: Abnormal    Collection Time: 02/25/15  5:30 PM  Result Value Ref Range   Troponin I 0.07 (H) <0.031 ng/mL    Comment: RESULTS PREVIOUSLY CALLED AT 1241 02/25/15.PMH        PERSISTENTLY INCREASED TROPONIN VALUES IN THE RANGE OF 0.04-0.49 ng/mL CAN BE SEEN IN:       -UNSTABLE ANGINA       -CONGESTIVE HEART FAILURE       -MYOCARDITIS       -CHEST TRAUMA       -ARRYHTHMIAS       -  LATE PRESENTING MYOCARDIAL INFARCTION       -COPD   CLINICAL FOLLOW-UP RECOMMENDED.   Glucose, capillary     Status: Abnormal   Collection Time: 02/25/15  5:32 PM  Result Value Ref Range   Glucose-Capillary 209 (H) 65 - 99 mg/dL  Glucose, capillary     Status: Abnormal   Collection Time: 02/25/15  8:22 PM  Result Value Ref Range   Glucose-Capillary 245 (H) 65 - 99 mg/dL  Troponin I     Status: Abnormal   Collection Time: 02/25/15 11:01 PM  Result Value Ref Range   Troponin I 0.06 (H) <0.031 ng/mL    Comment: RESULTS PREVIOUSLY CALLED AT 1241 02/25/15.PMH        PERSISTENTLY INCREASED TROPONIN VALUES IN THE RANGE OF 0.04-0.49 ng/mL CAN BE SEEN IN:       -UNSTABLE ANGINA       -CONGESTIVE HEART FAILURE       -MYOCARDITIS       -CHEST TRAUMA       -ARRYHTHMIAS       -LATE PRESENTING MYOCARDIAL INFARCTION       -COPD   CLINICAL FOLLOW-UP RECOMMENDED.   Hemoglobin A1c     Status: Abnormal   Collection Time: 02/26/15  4:48 AM  Result Value Ref Range   Hgb A1c MFr Bld 7.5 (H) 4.0 - 6.0 %  Comprehensive metabolic panel     Status: Abnormal   Collection Time: 02/26/15  4:48 AM  Result Value Ref Range   Sodium 138 135 - 145 mmol/L   Potassium 3.6 3.5 - 5.1 mmol/L   Chloride 108 101 - 111 mmol/L   CO2 26 22 - 32 mmol/L   Glucose, Bld 208 (H) 65 - 99 mg/dL   BUN 26 (H) 6 - 20 mg/dL   Creatinine, Ser 1.68 (H) 0.61 - 1.24 mg/dL   Calcium 7.9 (L) 8.9 - 10.3 mg/dL   Total Protein 6.2 (L) 6.5 - 8.1 g/dL   Albumin 2.4 (L) 3.5 - 5.0 g/dL   AST 50 (H) 15 - 41 U/L   ALT 42 17 - 63 U/L   Alkaline Phosphatase 209 (H) 38  - 126 U/L   Total Bilirubin 2.6 (H) 0.3 - 1.2 mg/dL   GFR calc non Af Amer 38 (L) >60 mL/min   GFR calc Af Amer 45 (L) >60 mL/min    Comment: (NOTE) The eGFR has been calculated using the CKD EPI equation. This calculation has not been validated in all clinical situations. eGFR's persistently <60 mL/min signify possible Chronic Kidney Disease.    Anion gap 4 (L) 5 - 15  CBC     Status: Abnormal   Collection Time: 02/26/15  4:48 AM  Result Value Ref Range   WBC 8.0 3.8 - 10.6 K/uL   RBC 3.10 (L) 4.40 - 5.90 MIL/uL   Hemoglobin 8.7 (L) 13.0 - 18.0 g/dL   HCT 27.1 (L) 40.0 - 52.0 %   MCV 87.3 80.0 - 100.0 fL   MCH 28.1 26.0 - 34.0 pg   MCHC 32.2 32.0 - 36.0 g/dL   RDW 19.5 (H) 11.5 - 14.5 %   Platelets 201 150 - 440 K/uL  Troponin I     Status: Abnormal   Collection Time: 02/26/15  4:48 AM  Result Value Ref Range   Troponin I 0.07 (H) <0.031 ng/mL    Comment: RESULTS PREVIOUSLY CALLED AT 1241 02/25/15.PMH        PERSISTENTLY INCREASED TROPONIN VALUES IN THE  RANGE OF 0.04-0.49 ng/mL CAN BE SEEN IN:       -UNSTABLE ANGINA       -CONGESTIVE HEART FAILURE       -MYOCARDITIS       -CHEST TRAUMA       -ARRYHTHMIAS       -LATE PRESENTING MYOCARDIAL INFARCTION       -COPD   CLINICAL FOLLOW-UP RECOMMENDED.   Glucose, capillary     Status: Abnormal   Collection Time: 02/26/15 11:10 AM  Result Value Ref Range   Glucose-Capillary 269 (H) 65 - 99 mg/dL   Comment 1 Notify RN   Glucose, capillary     Status: Abnormal   Collection Time: 02/26/15  4:28 PM  Result Value Ref Range   Glucose-Capillary 190 (H) 65 - 99 mg/dL   Comment 1 Notify RN   Glucose, capillary     Status: Abnormal   Collection Time: 02/26/15  8:40 PM  Result Value Ref Range   Glucose-Capillary 204 (H) 65 - 99 mg/dL  Basic metabolic panel     Status: Abnormal   Collection Time: 02/27/15  2:13 AM  Result Value Ref Range   Sodium 140 135 - 145 mmol/L   Potassium 3.7 3.5 - 5.1 mmol/L   Chloride 107 101 - 111  mmol/L   CO2 26 22 - 32 mmol/L   Glucose, Bld 158 (H) 65 - 99 mg/dL   BUN 25 (H) 6 - 20 mg/dL   Creatinine, Ser 1.72 (H) 0.61 - 1.24 mg/dL   Calcium 8.4 (L) 8.9 - 10.3 mg/dL   GFR calc non Af Amer 37 (L) >60 mL/min   GFR calc Af Amer 43 (L) >60 mL/min    Comment: (NOTE) The eGFR has been calculated using the CKD EPI equation. This calculation has not been validated in all clinical situations. eGFR's persistently <60 mL/min signify possible Chronic Kidney Disease.    Anion gap 7 5 - 15  Glucose, capillary     Status: Abnormal   Collection Time: 02/27/15  7:24 AM  Result Value Ref Range   Glucose-Capillary 149 (H) 65 - 99 mg/dL   Comment 1 Notify RN   Glucose, capillary     Status: Abnormal   Collection Time: 02/27/15 11:34 AM  Result Value Ref Range   Glucose-Capillary 189 (H) 65 - 99 mg/dL   Comment 1 Notify RN    No components found for: ESR, C REACTIVE PROTEIN MICRO: Recent Results (from the past 720 hour(s))  Blood culture (routine x 2)     Status: None (Preliminary result)   Collection Time: 02/25/15 12:00 PM  Result Value Ref Range Status   Specimen Description BLOOD LEFT HAND  Final   Special Requests BOTTLES DRAWN AEROBIC AND ANAEROBIC  5CC  Final   Culture  Setup Time   Final    GRAM NEGATIVE RODS AEROBIC BOTTLE ONLY CRITICAL RESULT CALLED TO, READ BACK BY AND VERIFIED WITH: RACHEL MCRAE AT 4098 ON 02/26/15 [facility] CONFIRMED BY [facility]    Culture KLEBSIELLA PNEUMONIAE AEROBIC BOTTLE ONLY   Final   Report Status PENDING  Incomplete   Organism ID, Bacteria KLEBSIELLA PNEUMONIAE  Final      Susceptibility   Klebsiella pneumoniae - MIC*    AMPICILLIN >=32 RESISTANT Resistant     CEFTAZIDIME <=1 SENSITIVE Sensitive     CEFAZOLIN <=4 SENSITIVE Sensitive     CEFTRIAXONE <=1 SENSITIVE Sensitive     CIPROFLOXACIN <=0.25 SENSITIVE Sensitive     GENTAMICIN <=1 SENSITIVE  Sensitive     IMIPENEM <=0.25 SENSITIVE Sensitive     TRIMETH/SULFA <=20 SENSITIVE Sensitive     PIP/TAZO  Value in next row Sensitive      SENSITIVE<=4    ERTAPENEM Value in next row Sensitive      SENSITIVE<=0.5    LEVOFLOXACIN Value in next row Sensitive      SENSITIVE<=0.12    * KLEBSIELLA PNEUMONIAE  Urine culture     Status: None   Collection Time: 02/25/15 12:04 PM  Result Value Ref Range Status   Specimen Description URINE, RANDOM  Final   Special Requests NONE  Final   Culture NO GROWTH 1 DAY  Final   Report Status 02/27/2015 FINAL  Final  Blood culture (routine x 2)     Status: None (Preliminary result)   Collection Time: 02/25/15 12:05 PM  Result Value Ref Range Status   Specimen Description BLOOD LEFT FOREARM  Final   Special Requests BOTTLES DRAWN AEROBIC AND ANAEROBIC  8CC  Final   Culture NO GROWTH 2 DAYS  Final   Report Status PENDING  Incomplete    IMAGING: Ct Abdomen Wo Contrast  02/10/2015  CLINICAL DATA:  Elevated LFTs and bilirubin, jaundice EXAM: CT ABDOMEN WITHOUT CONTRAST TECHNIQUE: Multidetector CT imaging of the abdomen was performed following the standard protocol without IV contrast. Sagittal and coronal MPR images reconstructed from axial data set. Patient drank dilute oral contrast for exam. COMPARISON:  None. FINDINGS: Minimal atelectasis dependently LEFT lower lobe. Liver, spleen, kidneys and adrenal glands unremarkable for noncontrast technique. Normal appendix. Heterogeneous region of low attenuation at the pancreatic body extending into the proximal tail could represent focal pancreatitis though a more focal area of low attenuation 21 x 17 mm is identified, cannot exclude tumor. No definite biliary dilatation, CBD 12 mm diameter. No definite biliary tract calcifications. Tiny hiatal hernia. Visualized bowel loops unremarkable. Scattered atherosclerotic calcifications. No additional mass, adenopathy free air or free fluid. IMPRESSION: Abnormal appearance to the pancreatic body which could be related to focal pancreatitis though mass/ tumor not excluded ; MR  imaging recommended to exclude pancreatic neoplasm. Mild biliary dilatation, can evaluate by MRCP sequence at time of MR imaging. Electronically Signed   By: Lavonia Dana M.D.   On: 02/10/2015 16:26   Dg Chest 2 View  02/25/2015  CLINICAL DATA:  74 year old male with a history of weakness and dizziness EXAM: CHEST - 2 VIEW COMPARISON:  01/22/2015, 10/26/2014 FINDINGS: Cardiomediastinal silhouette unchanged, with borderline cardiomegaly. Atherosclerotic calcifications of the aortic arch. Surgical changes of median sternotomy. Surgical changes of the cervical region again noted. Right cervical rib. Indistinctness of the central vasculature, particularly on the lateral view with interlobular septal opacities. No pleural effusion.  No confluent airspace disease. No displaced fracture. Unremarkable appearance of the upper abdomen. IMPRESSION: Low lung volumes, with appearance of developing pulmonary vascular congestion/ early edema. No lobar pneumonia or pleural effusion. Surgical changes of prior median sternotomy. Atherosclerosis. Signed, Dulcy Fanny. Earleen Newport, DO [facility] Electronically Signed   By: Corrie Mckusick D.O.   On: 02/25/2015 12:30   Ct Head Wo Contrast  02/10/2015  CLINICAL DATA:  Altered mental status EXAM: CT HEAD WITHOUT CONTRAST TECHNIQUE: Contiguous axial images were obtained from the base of the skull through the vertex without intravenous contrast. COMPARISON:  None. FINDINGS: Motion degraded images. No evidence of parenchymal hemorrhage or extra-axial fluid collection. No mass lesion, mass effect, or midline shift. No CT evidence of acute infarction.  Mild cortical atrophy.  No ventriculomegaly. The visualized paranasal sinuses are essentially clear. The mastoid air cells are unopacified. No evidence of calvarial fracture. IMPRESSION: Motion degraded images. No evidence of acute intracranial abnormality. Mild cortical atrophy.  Electronically Signed   By: Julian Hy M.D.   On: 02/10/2015 20:27   Ct Chest Wo Contrast  02/25/2015  CLINICAL DATA:  74 year old male with weakness and chest pain over the past 2 weeks as well as shortness of breath on exertion. EXAM: CT CHEST WITHOUT CONTRAST TECHNIQUE: Multidetector CT imaging of the chest was performed following the standard protocol without IV contrast. COMPARISON:  Chest x-ray earlier today ; recent prior CT of the abdomen and pelvis including the lung bases 02/14/2015 FINDINGS: Mediastinum: Unremarkable CT appearance of the thyroid gland. No suspicious mediastinal or hilar adenopathy. No soft tissue mediastinal mass. The thoracic esophagus is unremarkable. Heart/Vascular: Limited evaluation in the absence of intravenous contrast. Atherosclerotic calcifications noted throughout the transverse aorta and coronary arteries. 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 Mild cardiomegaly. No pericardial effusion. No aortic aneurysm. Normal caliber main pulmonary artery. Lungs/Pleura: Mild respiratory motion artifact. Mild dependent atelectasis. No focal airspace consolidation, evidence of pulmonary edema or suspicious pulmonary mass or nodule. Mild plaque-like calcification of the posterior pleura on the left. Healed median sternotomy. Bones/Soft Tissues: No acute fracture or aggressive appearing lytic or blastic osseous lesion. Upper Abdomen: Visualized upper abdominal organs are unremarkable. Expected pneumobilia given history of a biliary stent. Otherwise, unremarkable visualized upper abdomen. IMPRESSION: 1. No acute abnormality on this noncontrast CT scan of the chest. 2. Stable cardiomegaly with surgical changes of multivessel CABG. 3. Mild dependent atelectasis. 4. Plaque-like calcifications in the posterior pleural on the left. Electronically Signed   By: Jacqulynn Cadet M.D.   On: 02/25/2015 13:36   Ct Abdomen Pelvis  W Contrast  02/14/2015  CLINICAL DATA:  Diabetic with weakness and elevated bilirubin. Abnormal pancreas on recent noncontrast CT. EXAM: CT ABDOMEN AND PELVIS WITH CONTRAST TECHNIQUE: Multidetector CT imaging of the abdomen and pelvis was performed using the standard protocol following bolus administration of intravenous contrast. CONTRAST:  149m OMNIPAQUE IOHEXOL 300 MG/ML  SOLN COMPARISON:  Noncontrast abdominal CT 02/10/2015. FINDINGS: Lower chest: Mild emphysematous changes and subpleural reticulation are noted at both lung bases. The heart is enlarged status post median sternotomy. There is diffuse atherosclerosis of the coronary arteries and thoracic aorta. There are trace left-greater-than-right pleural effusions. Hepatobiliary: The liver demonstrates mildly decreased density consistent with steatosis. No focal hepatic abnormalities are identified. There is pneumobilia status post interval plastic biliary stent placement. The stent appears well positioned. The gallbladder is decompressed. Pancreas: The pancreatic duct is mildly dilated to 8 mm. There is mild soft tissue stranding around the pancreas suspicious for inflammation. Low-density lesions within the pancreatic head are again noted, measuring 13 mm on image 32 and 20 mm on image 35. These are similar to the recent noncontrast study. There is also some low density in the pancreatic tail, best seen on the reformatted images. No enhancing parenchymal lesion identified. Spleen: Normal in size without focal abnormality. Adrenals/Urinary Tract: Both adrenal glands appear normal. There is no evidence of urinary tract calculus, hydronephrosis or renal mass. The right kidney demonstrates mild cortical thinning and a small cyst in its interpolar region. The left kidney appears normal. No bladder abnormality seen. Stomach/Bowel: No evidence of bowel wall thickening, distention or surrounding inflammatory change. The appendix appears normal.  Vascular/Lymphatic:  Prominent lymph nodes in the upper abdomen are likely reactive, including a 15 mm node in the gastrohepatic ligament on image 27 and a 12 mm portacaval node on image 34. There is no retroperitoneal lymphadenopathy. There is moderate atherosclerosis of the aorta, its branches and the iliac arteries. Reproductive: Mild enlargement of the prostate gland. Other: There is a small amount of ascites with retroperitoneal edema tracking along the pararenal spaces bilaterally. There is asymmetric fat in the left inguinal canal. Musculoskeletal: No acute or significant osseous findings. IMPRESSION: 1. Interval biliary stent placement with resulting pneumobilia and decompression of the biliary system and gallbladder. 2. Nonspecific low-density lesions within the pancreatic head and tail with persistent mild pancreatic ductal dilatation. Findings are favored to reflect acute pancreatitis with small pseudocysts, although cystic neoplasm cannot be excluded by this examination. Correlation with prior ERCP results recommended. 3. Imaging follow-up after the presumed acute inflammatory changes have resolved is recommended (2-4 months, preferably with MRI). Electronically Signed   By: Richardean Sale M.D.   On: 02/14/2015 16:39   US Carotid Bilateral  02/10/2015  CLINICAL DATA:  Syncope and history of diabetes, coronary artery disease and prior CABG. EXAM: BILATERAL CAROTID DUPLEX ULTRASOUND TECHNIQUE: Pearline Cables scale imaging, color Doppler and duplex ultrasound were performed of bilateral carotid and vertebral arteries in the neck. COMPARISON:  None. FINDINGS: Criteria: Quantification of carotid stenosis is based on velocity parameters that correlate the residual internal carotid diameter with NASCET-based stenosis levels, using the diameter of the distal internal carotid lumen as the denominator for stenosis measurement. The following velocity measurements were obtained: RIGHT ICA:  73/12 cm/sec CCA:  73/5 cm/sec  SYSTOLIC ICA/CCA RATIO:  1.1 DIASTOLIC ICA/CCA RATIO:  1.3 ECA:  164 cm/sec LEFT ICA:  105/18 cm/sec CCA:  32/99 cm/sec SYSTOLIC ICA/CCA RATIO:  1.2 DIASTOLIC ICA/CCA RATIO:  1.0 ECA:  147 cm/sec RIGHT CAROTID ARTERY: Mild amount of calcified plaque is present at the level of the distal bulb and proximal ICA. Velocities and waveforms are normal and estimated right ICA stenosis is less than 50%. RIGHT VERTEBRAL ARTERY: Antegrade flow with normal waveform and velocity. LEFT CAROTID ARTERY: There is a mild amount of calcified plaque at the level of the left carotid bulb and proximal ICA. Overall plaque volume is slightly greater on the left compared to the right. Velocities and waveforms are normal and estimated left ICA stenosis is less than 50%. LEFT VERTEBRAL ARTERY: Antegrade flow with normal waveform and velocity. IMPRESSION: Mild amount of plaque at the level of both carotid bulbs and proximal internal carotid arteries, left greater than right. No significant carotid stenosis is identified with estimated bilateral ICA stenoses of less than 50%. Electronically Signed   By: Aletta Edouard M.D.   On: 02/10/2015 17:15    Assessment:   DARALD UZZLE is a 74 y.o. male with recently dxed pancreatic cancer s/p recent EUS and ERCP admitted with sepsis and found to have Klebsiella Pna bacteremia.  Likely source is his GI tract given pancreatic cancer and recent procedures. However LFTs are improving and has min abd pain.  Recommendations Deescalate to ceftraixone then can change at dc to oral keflex 500 mg tid for 10 days of total abx ? If needs imaging abd system - will order RUQ USS - given ARF hesitant to get CT.  If develops abd pain would consult GI for further eval.  Thank you very much for allowing me to participate in the care of this patient. Please call with questions.  Cheral Marker. Ola Spurr, MD

## 2015-02-27 NOTE — Plan of Care (Signed)
Problem: Physical Regulation: Goal: Ability to maintain clinical measurements within normal limits will improve Outcome: Progressing Pt more alert and active per family.  Antibiotics changed.

## 2015-02-28 ENCOUNTER — Inpatient Hospital Stay: Payer: Commercial Managed Care - HMO

## 2015-02-28 ENCOUNTER — Ambulatory Visit: Admission: RE | Admit: 2015-02-28 | Payer: Commercial Managed Care - HMO | Source: Ambulatory Visit

## 2015-02-28 LAB — HEPATIC FUNCTION PANEL
ALT: 30 U/L (ref 17–63)
AST: 32 U/L (ref 15–41)
Albumin: 2.6 g/dL — ABNORMAL LOW (ref 3.5–5.0)
Alkaline Phosphatase: 211 U/L — ABNORMAL HIGH (ref 38–126)
BILIRUBIN DIRECT: 1.1 mg/dL — AB (ref 0.1–0.5)
BILIRUBIN INDIRECT: 1.2 mg/dL — AB (ref 0.3–0.9)
BILIRUBIN TOTAL: 2.3 mg/dL — AB (ref 0.3–1.2)
Total Protein: 6.7 g/dL (ref 6.5–8.1)

## 2015-02-28 LAB — BASIC METABOLIC PANEL
Anion gap: 10 (ref 5–15)
BUN: 25 mg/dL — AB (ref 6–20)
CALCIUM: 8.8 mg/dL — AB (ref 8.9–10.3)
CO2: 27 mmol/L (ref 22–32)
Chloride: 103 mmol/L (ref 101–111)
Creatinine, Ser: 1.58 mg/dL — ABNORMAL HIGH (ref 0.61–1.24)
GFR calc non Af Amer: 41 mL/min — ABNORMAL LOW (ref >60)
GFR, EST AFRICAN AMERICAN: 48 mL/min — AB (ref >60)
Glucose, Bld: 152 mg/dL — ABNORMAL HIGH (ref 65–99)
POTASSIUM: 3.4 mmol/L — AB (ref 3.5–5.1)
SODIUM: 140 mmol/L (ref 135–145)

## 2015-02-28 LAB — GLUCOSE, CAPILLARY
GLUCOSE-CAPILLARY: 204 mg/dL — AB (ref 65–99)
Glucose-Capillary: 147 mg/dL — ABNORMAL HIGH (ref 65–99)
Glucose-Capillary: 278 mg/dL — ABNORMAL HIGH (ref 65–99)
Glucose-Capillary: 209 mg/dL — ABNORMAL HIGH (ref 65–99)

## 2015-02-28 MED ORDER — POLYETHYLENE GLYCOL 3350 17 G PO PACK
17.0000 g | PACK | Freq: Every day | ORAL | Status: DC
  Administered 2015-02-28 – 2015-03-01 (×2): 17 g via ORAL
  Filled 2015-02-28 (×2): qty 1

## 2015-02-28 NOTE — Progress Notes (Signed)
[facility] INFECTIOUS DISEASE PROGRESS NOTE Date of Admission:  02/25/2015     ID: [name] is a 74 y.o. male with Klebsiella bacteremia in setting of recent dx panc cancer and recent ECRP and ESU  Active Problems:   Sepsis ([place])   Subjective: No fevers, min abd pain   ROS  Eleven systems are reviewed and negative except per hpi  Medications:  Antibiotics Given (last 72 hours)    Date/Time Action Medication Dose Rate   02/25/15 2125 Given   piperacillin-tazobactam (ZOSYN) IVPB 3.375 g 3.375 g 12.5 mL/hr   02/26/15 0535 Given   piperacillin-tazobactam (ZOSYN) IVPB 3.375 g 3.375 g 12.5 mL/hr   02/26/15 1238 Given   piperacillin-tazobactam (ZOSYN) IVPB 3.375 g 3.375 g 12.5 mL/hr   02/26/15 1535 Given   vancomycin (VANCOCIN) 1,250 mg in sodium chloride 0.9 % 250 mL IVPB 1,250 mg 166.7 mL/hr   02/26/15 2057 Given   piperacillin-tazobactam (ZOSYN) IVPB 3.375 g 3.375 g 12.5 mL/hr   02/27/15 6295 Given   piperacillin-tazobactam (ZOSYN) IVPB 3.375 g 3.375 g 12.5 mL/hr   02/27/15 1225 Given   piperacillin-tazobactam (ZOSYN) IVPB 3.375 g 3.375 g 12.5 mL/hr   02/27/15 1714 Given   cefTRIAXone (ROCEPHIN) 2 g in dextrose 5 % 50 mL IVPB 2 g 100 mL/hr     . aspirin EC  81 mg Oral Daily  . cefTRIAXone (ROCEPHIN)  IV  2 g Intravenous Q24H  . docusate sodium  100 mg Oral BID  . gabapentin  300 mg Oral BID  . insulin aspart  0-5 Units Subcutaneous QHS  . insulin aspart  0-9 Units Subcutaneous TID WC  . insulin glargine  30 Units Subcutaneous BID  . pantoprazole  40 mg Oral Daily  . pravastatin  20 mg Oral q1800  . sodium chloride  3 mL Intravenous Q12H  . torsemide  10 mg Oral QPM  . torsemide  20 mg Oral q morning - 10a    Objective: Vital signs in last 24 hours: Temp:  [97.9 F (36.6 C)-99.1 F (37.3 C)] 97.9 F (36.6 C) (11/08 1154) Pulse Rate:  [70-83] 73 (11/08 1154) Resp:  [16-20] 20 (11/08 1154) BP: (122-166)/(53-89) 132/89 mmHg (11/08 1154) SpO2:  [95 %-98  %] 98 % (11/08 1154) Constitutional: He is oriented to person, place, and time. He appears well-developed and well-nourished. No distress. obese HENT:  Mouth/Throat: Oropharynx is clear and moist. No oropharyngeal exudate.  Cardiovascular: Normal rate, regular rhythm and normal heart sounds. Exam reveals no gallop and no friction rub.  No murmur heard.  Pulmonary/Chest: Effort normal and breath sounds normal. No respiratory distress. He has no wheezes.  Abdominal: Soft. Bowel sounds are normal. Obese, mild ttp over xyphoid process Lymphadenopathy:  He has no cervical adenopathy.  Neurological: He is alert and oriented to person, place, and time.  Skin: Skin is warm and dry. No rash noted. No erythema.  Psychiatric: He has a normal mood and affect. His behavior is normal.    Lab Results  Recent Labs  02/26/15 0448 02/27/15 0213 02/28/15 0413  WBC 8.0  --   --   HGB 8.7*  --   --   HCT 27.1*  --   --   NA 138 140 140  K 3.6 3.7 3.4*  CL 108 107 103  CO2 26 26 27   BUN 26* 25* 25*  CREATININE 1.68* 1.72* 1.58*    Microbiology: Results for orders placed or performed during the hospital encounter of 02/25/15  Blood culture (routine x 2)     Status: None (Preliminary result)   Collection Time: 02/25/15 12:00 PM  Result Value Ref Range Status   Specimen Description BLOOD LEFT HAND  Final   Special Requests BOTTLES DRAWN AEROBIC AND ANAEROBIC  5CC  Final   Culture  Setup Time   Final    GRAM NEGATIVE RODS AEROBIC BOTTLE ONLY CRITICAL RESULT CALLED TO, READ BACK BY AND VERIFIED WITH: RACHEL MCRAE AT 4268 ON 02/26/15 [facility] CONFIRMED BY [facility]    Culture KLEBSIELLA PNEUMONIAE AEROBIC BOTTLE ONLY   Final   Report Status PENDING  Incomplete   Organism ID, Bacteria KLEBSIELLA PNEUMONIAE  Final      Susceptibility   Klebsiella pneumoniae - MIC*    AMPICILLIN >=32 RESISTANT Resistant     CEFTAZIDIME <=1 SENSITIVE Sensitive     CEFAZOLIN <=4 SENSITIVE Sensitive     CEFTRIAXONE  <=1 SENSITIVE Sensitive     CIPROFLOXACIN <=0.25 SENSITIVE Sensitive     GENTAMICIN <=1 SENSITIVE Sensitive     IMIPENEM <=0.25 SENSITIVE Sensitive     TRIMETH/SULFA <=20 SENSITIVE Sensitive     PIP/TAZO Value in next row Sensitive      SENSITIVE<=4    ERTAPENEM Value in next row Sensitive      SENSITIVE<=0.5    LEVOFLOXACIN Value in next row Sensitive      SENSITIVE<=0.12    * KLEBSIELLA PNEUMONIAE  Urine culture     Status: None   Collection Time: 02/25/15 12:04 PM  Result Value Ref Range Status   Specimen Description URINE, RANDOM  Final   Special Requests NONE  Final   Culture NO GROWTH 1 DAY  Final   Report Status 02/27/2015 FINAL  Final  Blood culture (routine x 2)     Status: None (Preliminary result)   Collection Time: 02/25/15 12:05 PM  Result Value Ref Range Status   Specimen Description BLOOD LEFT FOREARM  Final   Special Requests BOTTLES DRAWN AEROBIC AND ANAEROBIC  8CC  Final   Culture NO GROWTH 3 DAYS  Final   Report Status PENDING  Incomplete    Studies/Results:  Ct Abdomen Wo Contrast  02/10/2015  CLINICAL DATA:  Elevated LFTs and bilirubin, jaundice EXAM: CT ABDOMEN WITHOUT CONTRAST TECHNIQUE: Multidetector CT imaging of the abdomen was performed following the standard protocol without IV contrast. Sagittal and coronal MPR images reconstructed from axial data set. Patient drank dilute oral contrast for exam. COMPARISON:  None. FINDINGS: Minimal atelectasis dependently LEFT lower lobe. Liver, spleen, kidneys and adrenal glands unremarkable for noncontrast technique. Normal appendix. Heterogeneous region of low attenuation at the pancreatic body extending into the proximal tail could represent focal pancreatitis though a more focal area of low attenuation 21 x 17 mm is identified, cannot exclude tumor. No definite biliary dilatation, CBD 12 mm diameter. No definite biliary tract calcifications. Tiny hiatal hernia. Visualized bowel loops unremarkable. Scattered  atherosclerotic calcifications. No additional mass, adenopathy free air or free fluid. IMPRESSION: Abnormal appearance to the pancreatic body which could be related to focal pancreatitis though mass/ tumor not excluded ; MR imaging recommended to exclude pancreatic neoplasm. Mild biliary dilatation, can evaluate by MRCP sequence at time of MR imaging. Electronically Signed   By: Lavonia Dana M.D.   On: 02/10/2015 16:26   Dg Chest 2 View  02/25/2015  CLINICAL DATA:  74 year old male with a history of weakness and dizziness EXAM: CHEST - 2 VIEW COMPARISON:  01/22/2015, 10/26/2014 FINDINGS: Cardiomediastinal silhouette unchanged, with borderline cardiomegaly.  Atherosclerotic calcifications of the aortic arch. Surgical changes of median sternotomy. Surgical changes of the cervical region again noted. Right cervical rib. Indistinctness of the central vasculature, particularly on the lateral view with interlobular septal opacities. No pleural effusion.  No confluent airspace disease. No displaced fracture. Unremarkable appearance of the upper abdomen. IMPRESSION: Low lung volumes, with appearance of developing pulmonary vascular congestion/ early edema. No lobar pneumonia or pleural effusion. Surgical changes of prior median sternotomy. Atherosclerosis. Signed, Dulcy Fanny. Earleen Newport, DO [facility] Electronically Signed   By: Corrie Mckusick D.O.   On: 02/25/2015 12:30   Ct Head Wo Contrast  02/10/2015  CLINICAL DATA:  Altered mental status EXAM: CT HEAD WITHOUT CONTRAST TECHNIQUE: Contiguous axial images were obtained from the base of the skull through the vertex without intravenous contrast. COMPARISON:  None. FINDINGS: Motion degraded images. No evidence of parenchymal hemorrhage or extra-axial fluid collection. No mass lesion, mass effect, or midline shift. No CT evidence of acute infarction. Mild cortical atrophy.  No ventriculomegaly. The visualized paranasal  sinuses are essentially clear. The mastoid air cells are unopacified. No evidence of calvarial fracture. IMPRESSION: Motion degraded images. No evidence of acute intracranial abnormality. Mild cortical atrophy. Electronically Signed   By: Julian Hy M.D.   On: 02/10/2015 20:27   Ct Chest Wo Contrast  02/25/2015  CLINICAL DATA:  74 year old male with weakness and chest pain over the past 2 weeks as well as shortness of breath on exertion. EXAM: CT CHEST WITHOUT CONTRAST TECHNIQUE: Multidetector CT imaging of the chest was performed following the standard protocol without IV contrast. COMPARISON:  Chest x-ray earlier today ; recent prior CT of the abdomen and pelvis including the lung bases 02/14/2015 FINDINGS: Mediastinum: Unremarkable CT appearance of the thyroid gland. No suspicious mediastinal or hilar adenopathy. No soft tissue mediastinal mass. The thoracic esophagus is unremarkable. Heart/Vascular: Limited evaluation in the absence of intravenous contrast. Atherosclerotic calcifications noted throughout the transverse aorta and coronary arteries. 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 Mild cardiomegaly. No pericardial effusion. No aortic aneurysm. Normal caliber main pulmonary artery. Lungs/Pleura: Mild respiratory motion artifact. Mild dependent atelectasis. No focal airspace consolidation, evidence of pulmonary edema or suspicious pulmonary mass or nodule. Mild plaque-like calcification of the posterior pleura on the left. Healed median sternotomy. Bones/Soft Tissues: No acute fracture or aggressive appearing lytic or blastic osseous lesion. Upper Abdomen: Visualized upper abdominal organs are unremarkable. Expected pneumobilia given history of a biliary stent. Otherwise, unremarkable visualized upper abdomen. IMPRESSION: 1. No acute abnormality on this noncontrast CT scan of the chest. 2. Stable cardiomegaly with surgical changes of  multivessel CABG. 3. Mild dependent atelectasis. 4. Plaque-like calcifications in the posterior pleural on the left. Electronically Signed   By: Jacqulynn Cadet M.D.   On: 02/25/2015 13:36   Ct Abdomen Pelvis W Contrast  02/14/2015  CLINICAL DATA:  Diabetic with weakness and elevated bilirubin. Abnormal pancreas on recent noncontrast CT. EXAM: CT ABDOMEN AND PELVIS WITH CONTRAST TECHNIQUE: Multidetector CT imaging of the abdomen and pelvis was performed using the standard protocol following bolus administration of intravenous contrast. CONTRAST:  194mL OMNIPAQUE IOHEXOL 300 MG/ML  SOLN COMPARISON:  Noncontrast abdominal CT 02/10/2015. FINDINGS: Lower chest: Mild emphysematous changes and subpleural reticulation are noted at both lung bases. The heart is enlarged status post median sternotomy. There is diffuse atherosclerosis of the coronary arteries and thoracic aorta. There are trace left-greater-than-right pleural effusions. Hepatobiliary: The  liver demonstrates mildly decreased density consistent with steatosis. No focal hepatic abnormalities are identified. There is pneumobilia status post interval plastic biliary stent placement. The stent appears well positioned. The gallbladder is decompressed. Pancreas: The pancreatic duct is mildly dilated to 8 mm. There is mild soft tissue stranding around the pancreas suspicious for inflammation. Low-density lesions within the pancreatic head are again noted, measuring 13 mm on image 32 and 20 mm on image 35. These are similar to the recent noncontrast study. There is also some low density in the pancreatic tail, best seen on the reformatted images. No enhancing parenchymal lesion identified. Spleen: Normal in size without focal abnormality. Adrenals/Urinary Tract: Both adrenal glands appear normal. There is no evidence of urinary tract calculus, hydronephrosis or renal mass. The right kidney demonstrates mild cortical thinning and a small cyst in its interpolar  region. The left kidney appears normal. No bladder abnormality seen. Stomach/Bowel: No evidence of bowel wall thickening, distention or surrounding inflammatory change. The appendix appears normal. Vascular/Lymphatic: Prominent lymph nodes in the upper abdomen are likely reactive, including a 15 mm node in the gastrohepatic ligament on image 27 and a 12 mm portacaval node on image 34. There is no retroperitoneal lymphadenopathy. There is moderate atherosclerosis of the aorta, its branches and the iliac arteries. Reproductive: Mild enlargement of the prostate gland. Other: There is a small amount of ascites with retroperitoneal edema tracking along the pararenal spaces bilaterally. There is asymmetric fat in the left inguinal canal. Musculoskeletal: No acute or significant osseous findings. IMPRESSION: 1. Interval biliary stent placement with resulting pneumobilia and decompression of the biliary system and gallbladder. 2. Nonspecific low-density lesions within the pancreatic head and tail with persistent mild pancreatic ductal dilatation. Findings are favored to reflect acute pancreatitis with small pseudocysts, although cystic neoplasm cannot be excluded by this examination. Correlation with prior ERCP results recommended. 3. Imaging follow-up after the presumed acute inflammatory changes have resolved is recommended (2-4 months, preferably with MRI). Electronically Signed   By: Richardean Sale M.D.   On: 02/14/2015 16:39   US Carotid Bilateral  02/10/2015  CLINICAL DATA:  Syncope and history of diabetes, coronary artery disease and prior CABG. EXAM: BILATERAL CAROTID DUPLEX ULTRASOUND TECHNIQUE: Pearline Cables scale imaging, color Doppler and duplex ultrasound were performed of bilateral carotid and vertebral arteries in the neck. COMPARISON:  None. FINDINGS: Criteria: Quantification of carotid stenosis is based on velocity parameters that correlate the residual internal carotid diameter with NASCET-based stenosis  levels, using the diameter of the distal internal carotid lumen as the denominator for stenosis measurement. The following velocity measurements were obtained: RIGHT ICA:  73/12 cm/sec CCA:  57/3 cm/sec SYSTOLIC ICA/CCA RATIO:  1.1 DIASTOLIC ICA/CCA RATIO:  1.3 ECA:  164 cm/sec LEFT ICA:  105/18 cm/sec CCA:  22/02 cm/sec SYSTOLIC ICA/CCA RATIO:  1.2 DIASTOLIC ICA/CCA RATIO:  1.0 ECA:  147 cm/sec RIGHT CAROTID ARTERY: Mild amount of calcified plaque is present at the level of the distal bulb and proximal ICA. Velocities and waveforms are normal and estimated right ICA stenosis is less than 50%. RIGHT VERTEBRAL ARTERY: Antegrade flow with normal waveform and velocity. LEFT CAROTID ARTERY: There is a mild amount of calcified plaque at the level of the left carotid bulb and proximal ICA. Overall plaque volume is slightly greater on the left compared to the right. Velocities and waveforms are normal and estimated left ICA stenosis is less than 50%. LEFT VERTEBRAL ARTERY: Antegrade flow with normal waveform and velocity. IMPRESSION: Mild amount of plaque  at the level of both carotid bulbs and proximal internal carotid arteries, left greater than right. No significant carotid stenosis is identified with estimated bilateral ICA stenoses of less than 50%. Electronically Signed   By: Aletta Edouard M.D.   On: 02/10/2015 17:15    Assessment/Plan: [name] is a 74 y.o. male with recently dxed pancreatic cancer s/p recent EUS and ERCP admitted with sepsis and found to have Klebsiella Pna bacteremia. Likely source is his GI tract given pancreatic cancer and recent procedures. However LFTs are improving and has min abd pain.  Recommendations Cont ceftraixone then can change at dc to oral keflex 500 mg tid for 10 days of total abx Checking  RUQ USS in AM to ensure no abscess or drain malfunction however I think this is unlikley. - given ARF hesitant to get CT.  If develops abd pain would consult GI for further  eval.  Thank you very much for the consult. Will follow with you.  [place], [place]   02/28/2015, 12:14 PM

## 2015-02-28 NOTE — Care Management (Signed)
Contacted by Criss Rosales with [facility].  It is documented in the medical record that patient is planning on pursuing whipple procedure at [facility].  This hospital is out of network with patient's insurance

## 2015-02-28 NOTE — Care Management Note (Signed)
Case Management Note  Patient Details  Name: ENNIS HEAVNER MRN: [id number] Date of Birth: 1940-09-22  Subjective/Objective:                 Patient admitted from home with diagnosis of sepsis.  Patient lives at home with wife, and has local support of family.  Patient obtains his medications through [employer], and [employer].  Patient's wife states that the medications are always covered by insurance.  Patient has a walker and cane at home to assist with ambulation.  Patient and wife both still drive.  Patient is currently receiving home health PT through [facility], and would like continue with [facility] at time of discharge.    Action/Plan: If home health services are indicated at time of discharge resumption of home health order will need to be placed.  .  Expected Discharge Date:                  Expected Discharge Plan:     In-House Referral:     Discharge planning Services     Post Acute Care Choice:    Choice offered to:     DME Arranged:    DME Agency:     HH Arranged:    [place] Agency:     Status of Service:     Medicare Important Message Given:    Date Medicare IM Given:    Medicare IM give by:    Date Additional Medicare IM Given:    Additional Medicare Important Message give by:     If discussed at [place] of Stay Meetings, dates discussed:    Additional Comments:  Beverly Sessions, RN 02/28/2015, 2:12 PM

## 2015-02-28 NOTE — Progress Notes (Signed)
Patient medicated for pain x1 with good effects,asked a lot of questions about his diagnosis,refered to physician for answers.hemodynamics within normal limit.

## 2015-02-28 NOTE — Evaluation (Signed)
Physical Therapy Evaluation Patient Details Name: [name] MRN: [id number] DOB: 09/17/1940 Today's Date: 02/28/2015   History of Present Illness  Patient is a 74 y/o male that presents with recent diagnosis of pancreatic cancer. Admitted with chills, rigors, and generalized weakness. Patient noted to have elevated troponins on this admission, demand ischemia liekly.   Clinical Impression  Patient is a very pleasant 74 y/o male that has apparently had sudden onset of weakness recently. He demonstrates improved strength during this session and is able to ambulate without AD or any loss of balance. Per his son ambulation today appears back to his baseline, though patient states he continues to feel somewhat weak. Per patient he has recently been making good progress with strength and balance with HHPT. No dyspnea noted on ambulation today, O2 sats maintained WNL. Skilled PT services are indicated to address his weakness and residual balance deficits.     Follow Up Recommendations Home health PT    Equipment Recommendations       Recommendations for Other Services       Precautions / Restrictions        Mobility  Bed Mobility               General bed mobility comments: Patient received in chair.   Transfers Overall transfer level: Needs assistance   Transfers: Sit to/from Stand Sit to Stand: Supervision         General transfer comment: 5x sit to stand in 21 seconds using hands to push off.   Ambulation/Gait Ambulation/Gait assistance: Supervision Ambulation Distance (Feet): 200 Feet   Gait Pattern/deviations: WFL(Within Functional Limits);Drifts right/left   Gait velocity interpretation: Below normal speed for age/gender General Gait Details: Patient noted to have mild lateral sway/drifting. No loss of balance.   Stairs            Wheelchair Mobility    Modified Rankin (Stroke Patients Only)       Balance Overall balance assessment: Needs  assistance Sitting-balance support: No upper extremity supported Sitting balance-Leahy Scale: Good Sitting balance - Comments: No deficits noted.    Standing balance support: No upper extremity supported Standing balance-Leahy Scale: Fair Standing balance comment: 5x sit to stand in 21 seconds. [place]etti - 24/28, modified DGI of 11/12                             Pertinent Vitals/Pain Pain Assessment:  (Does not mention any pain prior to this admission)    Home Living Family/patient expects to be discharged to:: Private residence Living Arrangements: Spouse/significant other Available Help at Discharge: Friend(s);Family;Available PRN/intermittently Type of Home: House Home Access: Level entry     Home Layout: One level Home Equipment: Walker - 2 wheels;Cane - single point;Bedside commode      Prior Function           Comments: Patient has not been ambulating with any devices recently, noted to have difficulty ambulating just prior to admission.      Hand Dominance        Extremity/Trunk Assessment   Upper Extremity Assessment: Overall WFL for tasks assessed           Lower Extremity Assessment: Overall WFL for tasks assessed         Communication   Communication: No difficulties  Cognition Arousal/Alertness: Awake/alert Behavior During Therapy: WFL for tasks assessed/performed Overall Cognitive Status: Within Functional Limits for tasks assessed  General Comments      Exercises        Assessment/Plan    PT Assessment Patient needs continued PT services  PT Diagnosis Difficulty walking   PT Problem List Decreased strength;Cardiopulmonary status limiting activity;Decreased activity tolerance;Decreased balance  PT Treatment Interventions DME instruction;Therapeutic activities;Therapeutic exercise;Gait training;Stair training;Balance training   PT Goals (Current goals can be found in the Care Plan section)  Acute Rehab PT Goals Patient Stated Goal: To return home.  PT Goal Formulation: With patient Time For Goal Achievement: 03/14/15 Potential to Achieve Goals: Good    Frequency Min 2X/week   Barriers to discharge        Co-evaluation               End of Session Equipment Utilized During Treatment: Gait belt Activity Tolerance: Patient tolerated treatment well Patient left: in chair;with call bell/phone within reach;with family/visitor present;with chair alarm set Nurse Communication: Mobility status         Time: 3832-9191 PT Time Calculation (min) (ACUTE ONLY): 15 min   Charges:   PT Evaluation $Initial PT Evaluation Tier I: 1 Procedure     PT G Codes:       Kerman Passey, PT, DPT    02/28/2015, 12:05 PM

## 2015-02-28 NOTE — Progress Notes (Signed)
[facility] [place] NAME: Jordyn Doane    MR#:  [id number]  DATE OF BIRTH:  23-Jun-1940  SUBJECTIVE:  CHIEF COMPLAINT:   Chief Complaint  Patient presents with  . Weakness   Generalized weakness is better. REVIEW OF SYSTEMS:  CONSTITUTIONAL: No fever but has generalized weakness.  EYES: No blurred or double vision.  EARS, NOSE, AND THROAT: No tinnitus or ear pain.  RESPIRATORY: No cough, shortness of breath, wheezing or hemoptysis.  CARDIOVASCULAR: No chest pain, orthopnea, edema.  GASTROINTESTINAL: No nausea, vomiting, diarrhea or abdominal pain.  GENITOURINARY: No dysuria, hematuria.  ENDOCRINE: No polyuria, nocturia,  HEMATOLOGY: No anemia, easy bruising or bleeding SKIN: No rash or lesion. MUSCULOSKELETAL: No joint pain or arthritis.   NEUROLOGIC: No tingling, numbness, weakness.  PSYCHIATRY: No anxiety or depression.   DRUG ALLERGIES:   Allergies  Allergen Reactions  . Zithromax [Azithromycin] Anaphylaxis and Other (See Comments)    Reaction:  Stroke-like symptoms   . Lisinopril Other (See Comments)    Per spouse, pt did not tolerate well in the past.      VITALS:  Blood pressure 132/89, pulse 73, temperature 97.9 F (36.6 C), temperature source Oral, resp. rate 20, height 5\' 9"  (1.753 m), weight 91.491 kg (201 lb 11.2 oz), SpO2 98 %.  PHYSICAL EXAMINATION:  GENERAL:  74 y.o.-year-old patient lying in the bed with no acute distress.  EYES: Pupils equal, round, reactive to light and accommodation. No scleral icterus. Extraocular muscles intact.  HEENT: Head atraumatic, normocephalic. Oropharynx and nasopharynx clear. Moist oral mucosa. NECK:  Supple, no jugular venous distention. No thyroid enlargement, no tenderness.  LUNGS: Normal breath sounds bilaterally, no wheezing, rales,rhonchi or crepitation. No use of accessory muscles of respiration.  CARDIOVASCULAR: S1, S2 normal. No murmurs, rubs, or gallops.   ABDOMEN: Soft, nontender, nondistended. Bowel sounds present. No organomegaly or mass.  EXTREMITIES: No pedal edema, cyanosis, or clubbing.  NEUROLOGIC: Cranial nerves II through XII are intact. Muscle strength 5/5 in all extremities. Sensation intact. Gait not checked.  PSYCHIATRIC: The patient is alert and oriented x 3.  SKIN: No obvious rash, lesion, or ulcer.    LABORATORY PANEL:   CBC  Recent Labs Lab 02/26/15 0448  WBC 8.0  HGB 8.7*  HCT 27.1*  PLT 201   ------------------------------------------------------------------------------------------------------------------  Chemistries   Recent Labs Lab 02/28/15 0413  NA 140  K 3.4*  CL 103  CO2 27  GLUCOSE 152*  BUN 25*  CREATININE 1.58*  CALCIUM 8.8*  AST 32  ALT 30  ALKPHOS 211*  BILITOT 2.3*   ------------------------------------------------------------------------------------------------------------------  Cardiac Enzymes  Recent Labs Lab 02/26/15 0448  TROPONINI 0.07*   ------------------------------------------------------------------------------------------------------------------  RADIOLOGY:  No results found.  EKG:   Orders placed or performed during the hospital encounter of 02/25/15  . ED EKG  . ED EKG    ASSESSMENT AND PLAN:   1. Sepsis with bacteremia. Blood culture: Klebsiella pneumonia. discontinued IV Zosyn and vancomycin, started Rocephin and then changed to by mouth Keflex for 10 days, abdominal ultrasound according to Dr. Ola Spurr.   2. History of coronary artery disease status post CABG with elevated troponin-probably from demand ischemia and acute renal insufficiency Continue aspirin and statin, no ischemic workup per Dr. Ubaldo Glassing.  3. Acute kidney injury on chronic renal insufficiency Improving with IV fluid, discontinued IVF, continue Demadex.  4. Chronic history of congestive heart failure. Stable. Continue Demadex.  5. Insulin requiring diabetes mellitus Not well  controlled, continue 30 units of Lantus twice a day (home dose) and continue sliding scale insulin.  6. Recent diagnosis of pancreatic cancer  No PET or chemo per Dr. Ignatius Specking.  PT evaluation suggested home health with PT.  All the records are reviewed and case discussed with Care Management/Social Workerr. Management plans discussed with the patient, his wife and they are in agreement. Greater than 50% time was spent on coordination of care and face-to-face counseling.  CODE STATUS: Full code  TOTAL TIME TAKING CARE OF THIS PATIENT: 36 minutes.   POSSIBLE D/C IN 1-2 DAYS, DEPENDING ON CLINICAL CONDITION.   Demetrios Loll M.D on 02/28/2015 at 5:34 PM  Between 7am to 6pm - Pager - [phone]  After 6pm go to [email or web address] - password [email or web address] [facility]  [place] [facility]  Office  [phone]  CC: Primary care physician; Rusty Aus., MD

## 2015-02-28 NOTE — Progress Notes (Signed)
NSR. Room air. FS are stable. Takes meds ok. Up to chair and tolerated it well .Wife at the bedside. Urinal. Pt has not reported any pain. Pt has no further concerns at this time.

## 2015-03-01 ENCOUNTER — Inpatient Hospital Stay: Payer: Commercial Managed Care - HMO

## 2015-03-01 ENCOUNTER — Ambulatory Visit: Payer: Commercial Managed Care - HMO | Admitting: Internal Medicine

## 2015-03-01 ENCOUNTER — Other Ambulatory Visit: Payer: Commercial Managed Care - HMO

## 2015-03-01 DIAGNOSIS — K830 Cholangitis: Secondary | ICD-10-CM

## 2015-03-01 DIAGNOSIS — Z79899 Other long term (current) drug therapy: Secondary | ICD-10-CM

## 2015-03-01 DIAGNOSIS — Z7982 Long term (current) use of aspirin: Secondary | ICD-10-CM

## 2015-03-01 DIAGNOSIS — Z808 Family history of malignant neoplasm of other organs or systems: Secondary | ICD-10-CM

## 2015-03-01 DIAGNOSIS — Z794 Long term (current) use of insulin: Secondary | ICD-10-CM

## 2015-03-01 DIAGNOSIS — Z87891 Personal history of nicotine dependence: Secondary | ICD-10-CM

## 2015-03-01 DIAGNOSIS — N179 Acute kidney failure, unspecified: Secondary | ICD-10-CM

## 2015-03-01 DIAGNOSIS — D649 Anemia, unspecified: Secondary | ICD-10-CM

## 2015-03-01 DIAGNOSIS — R17 Unspecified jaundice: Secondary | ICD-10-CM

## 2015-03-01 DIAGNOSIS — E1140 Type 2 diabetes mellitus with diabetic neuropathy, unspecified: Secondary | ICD-10-CM

## 2015-03-01 LAB — CULTURE, BLOOD (ROUTINE X 2)

## 2015-03-01 LAB — GLUCOSE, CAPILLARY: Glucose-Capillary: 200 mg/dL — ABNORMAL HIGH (ref 65–99)

## 2015-03-01 LAB — CANCER ANTIGEN 19-9: CA 19-9: 986 U/mL — ABNORMAL HIGH (ref 0–35)

## 2015-03-01 MED ORDER — CEPHALEXIN 500 MG PO CAPS: 500.0000 mg | ORAL_CAPSULE | Freq: Four times a day (QID) | ORAL | Status: DC

## 2015-03-01 MED ORDER — CEPHALEXIN 500 MG PO CAPS: 500.0000 mg | ORAL_CAPSULE | Freq: Three times a day (TID) | ORAL | Status: DC

## 2015-03-01 MED ORDER — ACETAMINOPHEN 325 MG PO TABS: 650.0000 mg | ORAL_TABLET | Freq: Four times a day (QID) | ORAL | Status: DC | PRN

## 2015-03-01 NOTE — Discharge Summary (Signed)
[facility] [place] NAME: [name]    MR#:  [id number]  DATE OF BIRTH:  1940/06/08  DATE OF ADMISSION:  02/25/2015 ADMITTING PHYSICIAN: Nicholes Mango, MD  DATE OF DISCHARGE: 03/01/2015  2:27 PM  PRIMARY CARE PHYSICIAN: Rusty Aus., MD    ADMISSION DIAGNOSIS:  Chest pain [R07.9] Fever, unspecified fever cause [R50.9] Acute renal failure, unspecified acute renal failure type ([place]) [N17.9]   DISCHARGE DIAGNOSIS:  Sepsis with bacteremia. Acute kidney injury on chronic renal insufficiency SECONDARY DIAGNOSIS:   Past Medical History  Diagnosis Date  . Hyperlipidemia   . H/O cardiac catheterization 06/03/06,12/03/11  . BPH (benign prostatic hypertrophy)   . Anxiety   . Syncope     LAUGHING INDUCED  . Myocardial infarction ([place])     x 2  . CHF (congestive heart failure) ([place])   . CAD (coronary artery disease) 06/03/06    cypher 2.5 x 36mm DES for 80% mid RCA sees Dr. Ubaldo Glassing  . Asthma     as a child  . Sleep apnea     hx of  . Kidney stone     hx of  . GERD (gastroesophageal reflux disease)     on medication for  . Cancer ([place])     hx of skin ca in "corner of left eye"  . Arthritis   . Anemia     taking iron  . Cataract, bilateral   . Neuromuscular disorder ([place])     diabetic neuropathy  . Diabetes mellitus without complication ([place])     HOSPITAL COURSE:   1. Sepsis with bacteremia. Blood culture: Klebsiella pneumonia. The patient was treated with IV Zosyn and vancomycin, which was discontinued. Per Dr. Ola Spurr, Rocephin was started and then changed to by mouth Keflex for 10 days, abdominal ultrasound didn't show any infection or abscess.  2. History of coronary artery disease status post CABG with elevated troponin-probably from demand ischemia and acute renal insufficiency He has been treated with aspirin and statin, no ischemic workup per Dr. Ubaldo Glassing.  3. Acute kidney injury on chronic renal  insufficiency Improved with IV fluid.  4. Chronic history of congestive heart failure. Stable. Continue Demadex.  5. Insulin requiring diabetes mellitus He has been treated with 30 units of Lantus twice a day and sliding scale insulin.  6. Recent diagnosis of pancreatic cancer  No PET or chemo per Dr. Ignatius Specking.  PT evaluation suggested home health with PT.  DISCHARGE CONDITIONS:   Stable, discharged to home with home health and PT today.  CONSULTS OBTAINED:  Treatment Team:  Teodoro Spray, MD Cammie Sickle, MD Lloyd Huger, MD Adrian Prows, MD  DRUG ALLERGIES:   Allergies  Allergen Reactions  . Zithromax [Azithromycin] Anaphylaxis and Other (See Comments)    Reaction:  Stroke-like symptoms   . Lisinopril Other (See Comments)    Per spouse, pt did not tolerate well in the past.      DISCHARGE MEDICATIONS:   Discharge Medication List as of 03/01/2015  2:06 PM    CONTINUE these medications which have CHANGED   Details  cephALEXin (KEFLEX) 500 MG capsule Take 1 capsule (500 mg total) by mouth 3 (three) times daily., Starting 03/01/2015, Until Discontinued, Print      CONTINUE these medications which have NOT CHANGED   Details  acetaminophen (TYLENOL) 650 MG CR tablet Take 1,300 mg by mouth every 8 (eight) hours as needed for pain., Until Discontinued, Historical Med  aspirin EC 81 MG tablet Take 81 mg by mouth daily., Until Discontinued, Historical Med    gabapentin (NEURONTIN) 300 MG capsule Take 300 mg by mouth 2 (two) times daily., Until Discontinued, Historical Med    glimepiride (AMARYL) 4 MG tablet Take 4 mg by mouth daily., Until Discontinued, Historical Med    HYDROcodone-acetaminophen (NORCO/VICODIN) 5-325 MG tablet Take 1 tablet by mouth every 6 (six) hours as needed. For pain., Starting 02/24/2015, Until Discontinued, Historical Med    insulin glargine (LANTUS) 100 UNIT/ML injection Inject 40 Units into the skin 2 (two) times daily.,  Until Discontinued, Historical Med    insulin regular (NOVOLIN R,HUMULIN R) 100 units/mL injection Inject 5-15 Units into the skin 3 (three) times daily before meals. Pt uses as needed per sliding scale., Until Discontinued, Historical Med    lovastatin (MEVACOR) 20 MG tablet Take 20 mg by mouth at bedtime., Until Discontinued, Historical Med    metolazone (ZAROXOLYN) 2.5 MG tablet Take 2.5 mg by mouth daily as needed (for weight gain). , Until Discontinued, Historical Med    omeprazole (PRILOSEC) 20 MG capsule Take 20 mg by mouth 2 (two) times daily., Until Discontinued, Historical Med    polyethylene glycol powder (GLYCOLAX/MIRALAX) powder Take 17 g by mouth daily as needed. Constipation., Starting 01/10/2015, Until Discontinued, Historical Med    torsemide (DEMADEX) 20 MG tablet Take 10-20 mg by mouth See admin instructions. Take 1 tablet orally once a day in the morning. Take 1/2 tablet (10mg ) orally once a day in the evening., Until Discontinued, Historical Med      STOP taking these medications     meloxicam (MOBIC) 7.5 MG tablet      acetaminophen (TYLENOL) 325 MG tablet          DISCHARGE INSTRUCTIONS:    If you experience worsening of your admission symptoms, develop shortness of breath, life threatening emergency, suicidal or homicidal thoughts you must seek medical attention immediately by calling 911 or calling your MD immediately  if symptoms less severe.  You Must read complete instructions/literature along with all the possible adverse reactions/side effects for all the Medicines you take and that have been prescribed to you. Take any new Medicines after you have completely understood and accept all the possible adverse reactions/side effects.   Please note  You were cared for by a hospitalist during your hospital stay. If you have any questions about your discharge medications or the care you received while you were in the hospital after you are discharged, you can  call the unit and asked to speak with the hospitalist on call if the hospitalist that took care of you is not available. Once you are discharged, your primary care physician will handle any further medical issues. Please note that NO REFILLS for any discharge medications will be authorized once you are discharged, as it is imperative that you return to your primary care physician (or establish a relationship with a primary care physician if you do not have one) for your aftercare needs so that they can reassess your need for medications and monitor your lab values.    Today   SUBJECTIVE   No complaint.   VITAL SIGNS:  Blood pressure 149/62, pulse 69, temperature 97.9 F (36.6 C), temperature source Oral, resp. rate 18, height 5\' 9"  (1.753 m), weight 91.491 kg (201 lb 11.2 oz), SpO2 96 %.  I/O:   Intake/Output Summary (Last 24 hours) at 03/01/15 1704 Last data filed at 03/01/15 1320  Gross per  24 hour  Intake    340 ml  Output   1650 ml  Net  -1310 ml    PHYSICAL EXAMINATION:  GENERAL:  74 y.o.-year-old patient lying in the bed with no acute distress.  EYES: Pupils equal, round, reactive to light and accommodation. No scleral icterus. Extraocular muscles intact.  HEENT: Head atraumatic, normocephalic. Oropharynx and nasopharynx clear. Moist oral mucosa. NECK:  Supple, no jugular venous distention. No thyroid enlargement, no tenderness.  LUNGS: Normal breath sounds bilaterally, no wheezing, rales,rhonchi or crepitation. No use of accessory muscles of respiration.  CARDIOVASCULAR: S1, S2 normal. No murmurs, rubs, or gallops.  ABDOMEN: Soft, non-tender, non-distended. Bowel sounds present. No organomegaly or mass.  EXTREMITIES: No pedal edema, cyanosis, or clubbing.  NEUROLOGIC: Cranial nerves II through XII are intact. Muscle strength 5/5 in all extremities. Sensation intact. Gait not checked.  PSYCHIATRIC: The patient is alert and oriented x 3.  SKIN: No obvious rash, lesion, or  ulcer.   DATA REVIEW:   CBC  Recent Labs Lab 02/26/15 0448  WBC 8.0  HGB 8.7*  HCT 27.1*  PLT 201    Chemistries   Recent Labs Lab 02/28/15 0413  NA 140  K 3.4*  CL 103  CO2 27  GLUCOSE 152*  BUN 25*  CREATININE 1.58*  CALCIUM 8.8*  AST 32  ALT 30  ALKPHOS 211*  BILITOT 2.3*    Cardiac Enzymes  Recent Labs Lab 02/26/15 0448  TROPONINI 0.07*    Microbiology Results  Results for orders placed or performed during the hospital encounter of 02/25/15  Blood culture (routine x 2)     Status: None   Collection Time: 02/25/15 12:00 PM  Result Value Ref Range Status   Specimen Description BLOOD LEFT HAND  Final   Special Requests BOTTLES DRAWN AEROBIC AND ANAEROBIC  5CC  Final   Culture  Setup Time   Final    GRAM NEGATIVE RODS AEROBIC BOTTLE ONLY CRITICAL RESULT CALLED TO, READ BACK BY AND VERIFIED WITH: RACHEL MCRAE AT [place] ON 02/26/15 [facility] CONFIRMED BY [facility]    Culture KLEBSIELLA PNEUMONIAE AEROBIC BOTTLE ONLY   Final   Report Status 03/01/2015 FINAL  Final   Organism ID, Bacteria KLEBSIELLA PNEUMONIAE  Final      Susceptibility   Klebsiella pneumoniae - MIC*    AMPICILLIN >=32 RESISTANT Resistant     CEFTAZIDIME <=1 SENSITIVE Sensitive     CEFAZOLIN <=4 SENSITIVE Sensitive     CEFTRIAXONE <=1 SENSITIVE Sensitive     CIPROFLOXACIN <=0.25 SENSITIVE Sensitive     GENTAMICIN <=1 SENSITIVE Sensitive     IMIPENEM <=0.25 SENSITIVE Sensitive     TRIMETH/SULFA <=20 SENSITIVE Sensitive     PIP/TAZO Value in next row Sensitive      SENSITIVE<=4    ERTAPENEM Value in next row Sensitive      SENSITIVE<=0.5    LEVOFLOXACIN Value in next row Sensitive      SENSITIVE<=0.12    * KLEBSIELLA PNEUMONIAE  Urine culture     Status: None   Collection Time: 02/25/15 12:04 PM  Result Value Ref Range Status   Specimen Description URINE, RANDOM  Final   Special Requests NONE  Final   Culture NO GROWTH 1 DAY  Final   Report Status 02/27/2015 FINAL  Final  Blood culture  (routine x 2)     Status: None (Preliminary result)   Collection Time: 02/25/15 12:05 PM  Result Value Ref Range Status   Specimen Description BLOOD LEFT FOREARM  Final   Special Requests BOTTLES DRAWN AEROBIC AND ANAEROBIC  8CC  Final   Culture NO GROWTH 3 DAYS  Final   Report Status PENDING  Incomplete    RADIOLOGY:  US Abdomen Limited Ruq  03/01/2015  CLINICAL DATA:  Biliary stent. EXAM: US ABDOMEN LIMITED - RIGHT UPPER QUADRANT COMPARISON:  02/14/2015 FINDINGS: Gallbladder: The gallbladder wall is prominent measuring 2.5 mm in thickness. Gallbladder sludge noted. Negative sonographic Murphy's sign. Common bile duct: Diameter: 13 mm.  Biliary stent is identified. Liver: The liver appears echogenic compatible with hepatic steatosis. No intrahepatic bile duct dilatation Other:  Trace ascites identified. IMPRESSION: 1. Increase caliber of the common bile duct with stent in place. No intrahepatic duct dilatation. 2. Gallbladder sludge. 3. Hepatic steatosis. Electronically Signed   By: Kerby Moors M.D.   On: 03/01/2015 11:33        Management plans discussed with the patient, his wife and they are in agreement.  CODE STATUS:     Code Status Orders        Start     Ordered   02/25/15 1708  Full code   Continuous     02/25/15 1708    Advance Directive Documentation        Most Recent Value   Type of Advance Directive  Healthcare Power of Attorney, Living will   Pre-existing out of facility DNR order (yellow form or pink MOST form)     "MOST" Form in Place?        TOTAL TIME TAKING CARE OF THIS PATIENT: 38 minutes.    Demetrios Loll M.D on 03/01/2015 at 5:04 PM  Between 7am to 6pm - Pager - [phone]  After 6pm go to [email or web address] - password [email or web address] [facility]  [place] [facility]  Office  [phone]  CC: Primary care physician; Rusty Aus., MD

## 2015-03-01 NOTE — Care Management (Signed)
Patient to discharge today.   PT had recommend recommends home health PT.  MD has placed order to resume home health orders with PT, RN, and aide.  Corene Cornea from [facility] notified of pending discharge.  Wife to transport patient home.  Patient will follow up outpatient for surgical evaluation.  No further needs identified.  RNCM signing off

## 2015-03-01 NOTE — Progress Notes (Signed)
.   [name]   DOB:Sep 30, 1940   JZ#:[id number]    Subjective: Pancreatic cancer with sepsis.  Patient denies any fevers; does admit to sweats last night. Denies any abdominal pain. No nausea no vomiting. He has been able to sit in the chair; also walk with some help.  Review of system: No chest pain shortness of breath. No cough.   Objective:  Filed Vitals:   03/01/15 0531  BP: 149/62  Pulse: 69  Temp: 97.9 F (36.6 C)  Resp: 18     Intake/Output Summary (Last 24 hours) at 03/01/15 0855 Last data filed at 03/01/15 0800  Gross per 24 hour  Intake    580 ml  Output   2300 ml  Net  -1720 ml   GENERAL: Well-nourished well-developed; Alert, no distress and comfortable. Accompanied by his wife.EYES: no pallor; positive for mild icterus OROPHARYNX: no thrush or ulceration;poor dentition NECK: supple, no masses felt LYMPH: no palpable lymphadenopathy in the cervical, axillary or inguinal regions LUNGS: bilateral basilar crackles No wheeze.  HEART/CVS: regular rate & rhythm and no murmurs; No lower extremity edema ABDOMEN: abdomen soft, non-tender and normal bowel sounds Musculoskeletal:no cyanosis of digits and no clubbing  PSYCH: alert & oriented x 3 with fluent speech NEURO: no focal motor/sensory deficits SKIN: no rashes or significant lesions   Labs:  Lab Results  Component Value Date   WBC 8.0 02/26/2015   HGB 8.7* 02/26/2015   HCT 27.1* 02/26/2015   MCV 87.3 02/26/2015   PLT 201 02/26/2015   NEUTROABS 5.9 02/22/2015    Lab Results  Component Value Date   NA 140 02/28/2015   K 3.4* 02/28/2015   CL 103 02/28/2015   CO2 27 02/28/2015    Studies:  No results found.  Assessment & Plan:   74 year old male patient with newly diagnosed pancreatic cancer stage II; recent biliary obstruction status post ERCP and stent currently admitted to the hospital for generalized weakness/chills/fever  # Klebsiella bacteremia sepsis-likely source of pancreatic or  biliary tract.   # pancreatic cancer- stage II based on EUS [T3 N0]; however has abutment of the SMV. CT scan chest done in the hospital negative for any metastases. Repeat CA-19-9 986 [down from 4003]- likely secondary to decompression of the biliary system.  # recent painless jaundice/obstructive jaundice status post ERCP stent placement. Most recent bilirubin 2.3 yesterday.  # Acute renal failure- creatinine improving at 1.58 from 11/ 8  Recommendations:   # Await ultrasound of the abdomen as per ID recommendations. Patient will need to finish of antibiotics before proceeding with systemic chemotherapy.   # Unfortunately, given the issues with insurance with a surgical evaluation at [facility]. I will look into alternatives- for surgical evaluation. This was discussed with patient's wife.   # Also discussed with the patient's wife/patient- that patient is high risk for subsequent infections especially with chemotherapy; repeated episodes like this unfortunately can happen.    Cammie Sickle, MD 03/01/2015  8:55 AM

## 2015-03-01 NOTE — Discharge Instructions (Signed)
Heart healthy and ADA diet. °Activity as tolerated. °HHPT °

## 2015-03-01 NOTE — Progress Notes (Signed)
Pt is a&o, NSR on tele, VSS with no complaints of pain or discomfort. Orders to D/c pt to home with [facility] after Abd [place]. Abd [place] completed and results explained to family by MD. Discharge instructions and prescription given to pt with verbal acknowledgment of understanding. IV and tele removed and pt escorted off unit via wheelchair by volunteer services.

## 2015-03-02 ENCOUNTER — Telehealth: Payer: Self-pay

## 2015-03-02 ENCOUNTER — Other Ambulatory Visit: Payer: Self-pay

## 2015-03-02 DIAGNOSIS — C250 Malignant neoplasm of head of pancreas: Secondary | ICD-10-CM

## 2015-03-02 NOTE — Telephone Encounter (Signed)
  Oncology Nurse Navigator Documentation    Navigator Encounter Type: Telephone (03/02/15 1100)         Interventions: Coordination of Care (03/02/15 1100)            Time Spent with Patient: 30 (03/02/15 1100)   Due to [facility] being out of network for [name], referral will be made to [facility] Dr Stark Klein. I have talked with [name] (spouse) and she is okay with this. Dr Emily Filbert (PCP) notified for referral. Will make referral and appt once approved from Dr Sabra Heck.

## 2015-03-02 NOTE — Progress Notes (Signed)
  Oncology Nurse Navigator Documentation              Interventions: Coordination of Care (03/02/15 1600)            Time Spent with Patient: 15 (03/02/15 1600)   Surgical Oncology referral approved from PCP. Referral ordered and sent to [facility] for scheduling.

## 2015-03-03 ENCOUNTER — Ambulatory Visit: Payer: Commercial Managed Care - HMO | Admitting: Internal Medicine

## 2015-03-03 ENCOUNTER — Other Ambulatory Visit: Payer: Commercial Managed Care - HMO

## 2015-03-03 LAB — CULTURE, BLOOD (ROUTINE X 2): Culture: NO GROWTH

## 2015-03-06 ENCOUNTER — Telehealth: Payer: Self-pay

## 2015-03-06 NOTE — Telephone Encounter (Signed)
  Oncology Nurse Navigator Documentation    Navigator Encounter Type: Telephone (03/06/15 0900)                      Time Spent with Patient: 15 (03/06/15 0900)   [facility] to ensure appt has been made for [name] with Dr Barry Dienes. They state the RN must talk with Dr Barry Dienes to obtain an appt as they have no openings. They also asked for me to refax notes. Notes refaxed. They are asked to call him and me with the appt once arranged.

## 2015-03-07 ENCOUNTER — Telehealth: Payer: Self-pay

## 2015-03-07 NOTE — Telephone Encounter (Signed)
  Oncology Nurse Navigator Documentation    Navigator Encounter Type: Telephone (03/07/15 1600)                      Time Spent with Patient: 30 (03/07/15 1600)   Dr Marlowe Aschoff assistant Bernie left voicemail with an appt for 03-08-15 at 1700 with arrival time of 1630. Spouse notified and she stated there was no way she could get him there that late and that her daughter had another appt at that time. Next available appt may not be until Dec. She stated she was okay with that. Informed [facility] of above and asked that they call patient with the appt in case of conflicts in schedules. Also asked they notify me so I can record appt.

## 2015-03-07 NOTE — Telephone Encounter (Signed)
  Oncology Nurse Navigator Documentation    Navigator Encounter Type: Telephone (03/07/15 1623)                      Time Spent with Patient: 15 (03/07/15 1623)   Received call back from [facility]. They have spoken to [name] and she has accepted next available appt 04-03-15 at 0945 with Dr Barry Dienes.

## 2015-03-08 ENCOUNTER — Inpatient Hospital Stay: Payer: Commercial Managed Care - HMO | Admitting: Internal Medicine

## 2015-03-08 ENCOUNTER — Inpatient Hospital Stay (HOSPITAL_BASED_OUTPATIENT_CLINIC_OR_DEPARTMENT_OTHER): Payer: Commercial Managed Care - HMO | Admitting: Internal Medicine

## 2015-03-08 VITALS — BP 152/72 | HR 78 | Temp 97.0°F | Resp 18 | Ht 69.0 in | Wt 202.0 lb

## 2015-03-08 DIAGNOSIS — I517 Cardiomegaly: Secondary | ICD-10-CM | POA: Diagnosis not present

## 2015-03-08 DIAGNOSIS — I6521 Occlusion and stenosis of right carotid artery: Secondary | ICD-10-CM

## 2015-03-08 DIAGNOSIS — F419 Anxiety disorder, unspecified: Secondary | ICD-10-CM | POA: Diagnosis not present

## 2015-03-08 DIAGNOSIS — Z794 Long term (current) use of insulin: Secondary | ICD-10-CM | POA: Diagnosis not present

## 2015-03-08 DIAGNOSIS — R109 Unspecified abdominal pain: Secondary | ICD-10-CM

## 2015-03-08 DIAGNOSIS — Z87442 Personal history of urinary calculi: Secondary | ICD-10-CM

## 2015-03-08 DIAGNOSIS — I509 Heart failure, unspecified: Secondary | ICD-10-CM

## 2015-03-08 DIAGNOSIS — E1140 Type 2 diabetes mellitus with diabetic neuropathy, unspecified: Secondary | ICD-10-CM

## 2015-03-08 DIAGNOSIS — K831 Obstruction of bile duct: Secondary | ICD-10-CM | POA: Diagnosis not present

## 2015-03-08 DIAGNOSIS — Z951 Presence of aortocoronary bypass graft: Secondary | ICD-10-CM

## 2015-03-08 DIAGNOSIS — G4730 Sleep apnea, unspecified: Secondary | ICD-10-CM

## 2015-03-08 DIAGNOSIS — G319 Degenerative disease of nervous system, unspecified: Secondary | ICD-10-CM

## 2015-03-08 DIAGNOSIS — Z85828 Personal history of other malignant neoplasm of skin: Secondary | ICD-10-CM

## 2015-03-08 DIAGNOSIS — I2510 Atherosclerotic heart disease of native coronary artery without angina pectoris: Secondary | ICD-10-CM | POA: Diagnosis not present

## 2015-03-08 DIAGNOSIS — J9811 Atelectasis: Secondary | ICD-10-CM

## 2015-03-08 DIAGNOSIS — I252 Old myocardial infarction: Secondary | ICD-10-CM

## 2015-03-08 DIAGNOSIS — R0609 Other forms of dyspnea: Secondary | ICD-10-CM

## 2015-03-08 DIAGNOSIS — C259 Malignant neoplasm of pancreas, unspecified: Secondary | ICD-10-CM

## 2015-03-08 DIAGNOSIS — M129 Arthropathy, unspecified: Secondary | ICD-10-CM | POA: Diagnosis not present

## 2015-03-08 DIAGNOSIS — R978 Other abnormal tumor markers: Secondary | ICD-10-CM

## 2015-03-08 DIAGNOSIS — D649 Anemia, unspecified: Secondary | ICD-10-CM | POA: Diagnosis not present

## 2015-03-08 DIAGNOSIS — Z7982 Long term (current) use of aspirin: Secondary | ICD-10-CM | POA: Diagnosis not present

## 2015-03-08 DIAGNOSIS — R531 Weakness: Secondary | ICD-10-CM | POA: Diagnosis not present

## 2015-03-08 DIAGNOSIS — Z79899 Other long term (current) drug therapy: Secondary | ICD-10-CM | POA: Diagnosis not present

## 2015-03-08 DIAGNOSIS — K219 Gastro-esophageal reflux disease without esophagitis: Secondary | ICD-10-CM

## 2015-03-08 DIAGNOSIS — M7989 Other specified soft tissue disorders: Secondary | ICD-10-CM

## 2015-03-08 DIAGNOSIS — R7989 Other specified abnormal findings of blood chemistry: Secondary | ICD-10-CM

## 2015-03-08 DIAGNOSIS — K760 Fatty (change of) liver, not elsewhere classified: Secondary | ICD-10-CM

## 2015-03-08 DIAGNOSIS — E785 Hyperlipidemia, unspecified: Secondary | ICD-10-CM

## 2015-03-08 DIAGNOSIS — R55 Syncope and collapse: Secondary | ICD-10-CM

## 2015-03-08 DIAGNOSIS — N400 Enlarged prostate without lower urinary tract symptoms: Secondary | ICD-10-CM | POA: Diagnosis not present

## 2015-03-08 DIAGNOSIS — R5383 Other fatigue: Secondary | ICD-10-CM

## 2015-03-08 DIAGNOSIS — C250 Malignant neoplasm of head of pancreas: Secondary | ICD-10-CM

## 2015-03-08 NOTE — Progress Notes (Signed)
Patient uses [facility] mail order pharmacy.  RX for xeloda will to be shipped to   [address], [place], [place] [address]   Approximately 30 minutes spent educating the patient and his family members on xeloda. Teach back process performed.

## 2015-03-08 NOTE — Progress Notes (Signed)
Patient is here for follow-up post hospital discharge. Patient was discharged last Wednesday. He states that since coming home he has been feeling "fair". He denies any pain today. Appetite has been "ok".

## 2015-03-08 NOTE — Progress Notes (Signed)
[facility] CONSULT NOTE  Patient Care Team: Rusty Aus, MD as PCP - General (Unknown Physician Specialty) Clent Jacks, RN as Registered Nurse  CHIEF COMPLAINTS/PURPOSE OF CONSULTATION:  # OCT 2016- PANCREATIC ADENO CA STAGE IIB [EUS-T3N0- abutting/early invasion of SMV]; CT- no distant mets. NOV 2016- ca-19-9- [id number]  # OBSTRUCTIVE JAUNDICE s/p ERCP [s/p stenting; Dr.Wohl; Dr.Rein- ca-19-9- [id number];Oct 2016]  HISTORY OF PRESENTING ILLNESS:  [name] 74 y.o.  male recent diagnosis of pancreas cancer T3 N0 based on EUS is here to review further treatment options.  In the interim patient was admitted to the hospital for generalized weakness- found to have bacteremia/sepsis/mild renal failure. I reviewed the records.   Patient is currently home recuperating from the recent hospitalization. However he still feels weak intermittently. His appetite is fair. Denies any significant pain. No nausea no vomiting.  He still feels mildly fatigued. He has mild swelling in the legs.  ROS: A complete 10 point review of system is done which is negative except mentioned above in history of present illness  MEDICAL HISTORY:  Past Medical History  Diagnosis Date  . Hyperlipidemia   . H/O cardiac catheterization 06/03/06,12/03/11  . BPH (benign prostatic hypertrophy)   . Anxiety   . Syncope     LAUGHING INDUCED  . Myocardial infarction ([place])     x 2  . CHF (congestive heart failure) ([place])   . CAD (coronary artery disease) 06/03/06    cypher 2.5 x 23mm DES for 80% mid RCA sees Dr. Ubaldo Glassing  . Asthma     as a child  . Sleep apnea     hx of  . Kidney stone     hx of  . GERD (gastroesophageal reflux disease)     on medication for  . Cancer ([place])     hx of skin ca in "corner of left eye"  . Arthritis   . Anemia     taking iron  . Cataract, bilateral   . Neuromuscular disorder ([place])     diabetic neuropathy  . Diabetes mellitus without complication ([place])     SURGICAL  HISTORY: Past Surgical History  Procedure Laterality Date  . Spine surgery  9/05    CERVICAL LAMINECTOMY  . Cervical herniated    . Eye surgery      as a child  . Coronary artery bypass graft  12/12/2011    Procedure: CORONARY ARTERY BYPASS GRAFTING (CABG);  Surgeon: Gaye Pollack, MD;  Location: [facility];  Service: Open Heart Surgery;  Laterality: N/A;  coronary artery bypass graft times four using left internal mammary artery, right leg greater saphenous vein harvested endoscopically  . Cardiac catheterization N/A 10/26/2014    Procedure: Left Heart Cath;  Surgeon: Isaias Cowman, MD;  Location: [facility];  Service: Cardiovascular;  Laterality: N/A;  . Endoscopic retrograde cholangiopancreatography (ercp) with propofol N/A 02/13/2015    Procedure: ENDOSCOPIC RETROGRADE CHOLANGIOPANCREATOGRAPHY (ERCP) WITH PROPOFOL;  Surgeon: Lucilla Lame, MD;  Location: [facility];  Service: Endoscopy;  Laterality: N/A;  . Eus N/A 02/23/2015    Procedure: UPPER ENDOSCOPIC ULTRASOUND (EUS) LINEAR;  Surgeon: Holly Bodily, MD;  Location: [facility];  Service: Gastroenterology;  Laterality: N/A;    SOCIAL HISTORY: Social History   Social History  . Marital Status: Married    Spouse Name: N/A  . Number of Children: N/A  . Years of Education: N/A   Occupational History  . Not on file.   Social  History Main Topics  . Smoking status: Former Smoker    Types: Cigarettes, Cigars    Quit date: 12/03/1977  . Smokeless tobacco: Never Used  . Alcohol Use: No  . Drug Use: No  . Sexual Activity: Not on file   Other Topics Concern  . Not on file   Social History Narrative   Lives with his wife    FAMILY HISTORY: Family History  Problem Relation Age of Onset  . Diabetes Mellitus II Mother     Also Brother and sister  . Pancreatic cancer Mother   . Leukemia Father   . Diabetes Mellitus II Sister   . Diabetes Mellitus II Brother     ALLERGIES:  is allergic to zithromax  and lisinopril.  MEDICATIONS:  Current Outpatient Prescriptions  Medication Sig Dispense Refill  . acetaminophen (TYLENOL) 650 MG CR tablet Take 1,300 mg by mouth every 8 (eight) hours as needed for pain.    Marland Kitchen aspirin EC 81 MG tablet Take 81 mg by mouth daily.    . cephALEXin (KEFLEX) 500 MG capsule Take 1 capsule (500 mg total) by mouth 3 (three) times daily. 30 capsule 0  . gabapentin (NEURONTIN) 300 MG capsule Take 300 mg by mouth 2 (two) times daily.    Marland Kitchen HYDROcodone-acetaminophen (NORCO/VICODIN) 5-325 MG tablet Take 1 tablet by mouth every 6 (six) hours as needed. For pain.    Marland Kitchen insulin glargine (LANTUS) 100 UNIT/ML injection Inject 40 Units into the skin 2 (two) times daily.    . insulin regular (NOVOLIN R,HUMULIN R) 100 units/mL injection Inject 5-15 Units into the skin 3 (three) times daily before meals. Pt uses as needed per sliding scale.    . lovastatin (MEVACOR) 20 MG tablet Take 20 mg by mouth at bedtime.    Marland Kitchen omeprazole (PRILOSEC) 20 MG capsule Take 20 mg by mouth 2 (two) times daily.    . polyethylene glycol powder (GLYCOLAX/MIRALAX) powder Take 17 g by mouth daily as needed. Constipation.     No current facility-administered medications for this visit.      Marland Kitchen  PHYSICAL EXAMINATION: ECOG PERFORMANCE STATUS: 1 - Symptomatic but completely ambulatory  Filed Vitals:   03/08/15 1507  BP: 152/72  Pulse: 78  Temp: 97 F (36.1 C)  Resp: 18   Filed Weights   03/08/15 1507  Weight: 202 lb (91.627 kg)    GENERAL: Well-nourished well-developed; Alert, no distress and comfortable.   Accompanied by wife/daughter. EYES: Positive for icterus OROPHARYNX: no thrush or ulceration; poor dentition NECK: supple, no masses felt LYMPH:  no palpable lymphadenopathy in the cervical, axillary or inguinal regions LUNGS: clear to auscultation and  No wheeze or crackles HEART/CVS: regular rate & rhythm and no murmurs; 1+ bilateral lower extremity edema ABDOMEN: abdomen soft, non-tender  and normal bowel sounds Musculoskeletal:no cyanosis of digits and no clubbing  PSYCH: alert & oriented x 3 with fluent speech NEURO: no focal motor/sensory deficits SKIN:  no rashes or significant lesions  LABORATORY DATA:  I have reviewed the data as listed Lab Results  Component Value Date   WBC 8.0 02/26/2015   HGB 8.7* 02/26/2015   HCT 27.1* 02/26/2015   MCV 87.3 02/26/2015   PLT 201 02/26/2015    Recent Labs  02/25/15 1204 02/26/15 0448 02/27/15 0213 02/28/15 0413  NA 139 138 140 140  K 3.7 3.6 3.7 3.4*  CL 107 108 107 103  CO2 25 26 26 27   GLUCOSE 122* 208* 158* 152*  BUN 26*  26* 25* 25*  CREATININE 2.02* 1.68* 1.72* 1.58*  CALCIUM 8.0* 7.9* 8.4* 8.8*  GFRNONAA 31* 38* 37* 41*  GFRAA 36* 45* 43* 48*  PROT 6.3* 6.2*  --  6.7  ALBUMIN 2.6* 2.4*  --  2.6*  AST 96* 50*  --  32  ALT 55 42  --  30  ALKPHOS 251* 209*  --  211*  BILITOT 3.4* 2.6*  --  2.3*  BILIDIR 1.8*  --   --  1.1*  IBILI 1.6*  --   --  1.2*    RADIOGRAPHIC STUDIES: I have personally reviewed the radiological images as listed and agreed with the findings in the report. Ct Abdomen Wo Contrast  02/10/2015  CLINICAL DATA:  Elevated LFTs and bilirubin, jaundice EXAM: CT ABDOMEN WITHOUT CONTRAST TECHNIQUE: Multidetector CT imaging of the abdomen was performed following the standard protocol without IV contrast. Sagittal and coronal MPR images reconstructed from axial data set. Patient drank dilute oral contrast for exam. COMPARISON:  None. FINDINGS: Minimal atelectasis dependently LEFT lower lobe. Liver, spleen, kidneys and adrenal glands unremarkable for noncontrast technique. Normal appendix. Heterogeneous region of low attenuation at the pancreatic body extending into the proximal tail could represent focal pancreatitis though a more focal area of low attenuation 21 x 17 mm is identified, cannot exclude tumor. No definite biliary dilatation, CBD 12 mm diameter. No definite biliary tract calcifications.  Tiny hiatal hernia. Visualized bowel loops unremarkable. Scattered atherosclerotic calcifications. No additional mass, adenopathy free air or free fluid. IMPRESSION: Abnormal appearance to the pancreatic body which could be related to focal pancreatitis though mass/ tumor not excluded ; MR imaging recommended to exclude pancreatic neoplasm. Mild biliary dilatation, can evaluate by MRCP sequence at time of MR imaging. Electronically Signed   By: Lavonia Dana M.D.   On: 02/10/2015 16:26   Dg Chest 2 View  02/25/2015  CLINICAL DATA:  74 year old male with a history of weakness and dizziness EXAM: CHEST - 2 VIEW COMPARISON:  01/22/2015, 10/26/2014 FINDINGS: Cardiomediastinal silhouette unchanged, with borderline cardiomegaly. Atherosclerotic calcifications of the aortic arch. Surgical changes of median sternotomy. Surgical changes of the cervical region again noted. Right cervical rib. Indistinctness of the central vasculature, particularly on the lateral view with interlobular septal opacities. No pleural effusion.  No confluent airspace disease. No displaced fracture. Unremarkable appearance of the upper abdomen. IMPRESSION: Low lung volumes, with appearance of developing pulmonary vascular congestion/ early edema. No lobar pneumonia or pleural effusion. Surgical changes of prior median sternotomy. Atherosclerosis. Signed, Dulcy Fanny. Earleen Newport, DO [facility] Electronically Signed   By: Corrie Mckusick D.O.   On: 02/25/2015 12:30   Ct Head Wo Contrast  02/10/2015  CLINICAL DATA:  Altered mental status EXAM: CT HEAD WITHOUT CONTRAST TECHNIQUE: Contiguous axial images were obtained from the base of the skull through the vertex without intravenous contrast. COMPARISON:  None. FINDINGS: Motion degraded images. No evidence of parenchymal hemorrhage or extra-axial fluid collection. No mass lesion, mass effect, or midline shift. No CT evidence of acute infarction.  Mild cortical atrophy.  No ventriculomegaly. The visualized paranasal sinuses are essentially clear. The mastoid air cells are unopacified. No evidence of calvarial fracture. IMPRESSION: Motion degraded images. No evidence of acute intracranial abnormality. Mild cortical atrophy. Electronically Signed   By: Julian Hy M.D.   On: 02/10/2015 20:27   Ct Chest Wo Contrast  02/25/2015  CLINICAL DATA:  74 year old male with weakness and chest pain over the past 2 weeks  as well as shortness of breath on exertion. EXAM: CT CHEST WITHOUT CONTRAST TECHNIQUE: Multidetector CT imaging of the chest was performed following the standard protocol without IV contrast. COMPARISON:  Chest x-ray earlier today ; recent prior CT of the abdomen and pelvis including the lung bases 02/14/2015 FINDINGS: Mediastinum: Unremarkable CT appearance of the thyroid gland. No suspicious mediastinal or hilar adenopathy. No soft tissue mediastinal mass. The thoracic esophagus is unremarkable. Heart/Vascular: Limited evaluation in the absence of intravenous contrast. Atherosclerotic calcifications noted throughout the transverse aorta and coronary arteries. 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 Mild cardiomegaly. No pericardial effusion. No aortic aneurysm. Normal caliber main pulmonary artery. Lungs/Pleura: Mild respiratory motion artifact. Mild dependent atelectasis. No focal airspace consolidation, evidence of pulmonary edema or suspicious pulmonary mass or nodule. Mild plaque-like calcification of the posterior pleura on the left. Healed median sternotomy. Bones/Soft Tissues: No acute fracture or aggressive appearing lytic or blastic osseous lesion. Upper Abdomen: Visualized upper abdominal organs are unremarkable. Expected pneumobilia given history of a biliary stent. Otherwise, unremarkable visualized upper abdomen. IMPRESSION: 1. No acute abnormality on this noncontrast  CT scan of the chest. 2. Stable cardiomegaly with surgical changes of multivessel CABG. 3. Mild dependent atelectasis. 4. Plaque-like calcifications in the posterior pleural on the left. Electronically Signed   By: Jacqulynn Cadet M.D.   On: 02/25/2015 13:36   Ct Abdomen Pelvis W Contrast  02/14/2015  CLINICAL DATA:  Diabetic with weakness and elevated bilirubin. Abnormal pancreas on recent noncontrast CT. EXAM: CT ABDOMEN AND PELVIS WITH CONTRAST TECHNIQUE: Multidetector CT imaging of the abdomen and pelvis was performed using the standard protocol following bolus administration of intravenous contrast. CONTRAST:  130mL OMNIPAQUE IOHEXOL 300 MG/ML  SOLN COMPARISON:  Noncontrast abdominal CT 02/10/2015. FINDINGS: Lower chest: Mild emphysematous changes and subpleural reticulation are noted at both lung bases. The heart is enlarged status post median sternotomy. There is diffuse atherosclerosis of the coronary arteries and thoracic aorta. There are trace left-greater-than-right pleural effusions. Hepatobiliary: The liver demonstrates mildly decreased density consistent with steatosis. No focal hepatic abnormalities are identified. There is pneumobilia status post interval plastic biliary stent placement. The stent appears well positioned. The gallbladder is decompressed. Pancreas: The pancreatic duct is mildly dilated to 8 mm. There is mild soft tissue stranding around the pancreas suspicious for inflammation. Low-density lesions within the pancreatic head are again noted, measuring 13 mm on image 32 and 20 mm on image 35. These are similar to the recent noncontrast study. There is also some low density in the pancreatic tail, best seen on the reformatted images. No enhancing parenchymal lesion identified. Spleen: Normal in size without focal abnormality. Adrenals/Urinary Tract: Both adrenal glands appear normal. There is no evidence of urinary tract calculus, hydronephrosis or renal mass. The right kidney  demonstrates mild cortical thinning and a small cyst in its interpolar region. The left kidney appears normal. No bladder abnormality seen. Stomach/Bowel: No evidence of bowel wall thickening, distention or surrounding inflammatory change. The appendix appears normal. Vascular/Lymphatic: Prominent lymph nodes in the upper abdomen are likely reactive, including a 15 mm node in the gastrohepatic ligament on image 27 and a 12 mm portacaval node on image 34. There is no retroperitoneal lymphadenopathy. There is moderate atherosclerosis of the aorta, its branches and the iliac arteries. Reproductive: Mild enlargement of the prostate gland. Other: There is a small amount of ascites with retroperitoneal edema tracking along the pararenal spaces bilaterally. There is asymmetric  fat in the left inguinal canal. Musculoskeletal: No acute or significant osseous findings. IMPRESSION: 1. Interval biliary stent placement with resulting pneumobilia and decompression of the biliary system and gallbladder. 2. Nonspecific low-density lesions within the pancreatic head and tail with persistent mild pancreatic ductal dilatation. Findings are favored to reflect acute pancreatitis with small pseudocysts, although cystic neoplasm cannot be excluded by this examination. Correlation with prior ERCP results recommended. 3. Imaging follow-up after the presumed acute inflammatory changes have resolved is recommended (2-4 months, preferably with MRI). Electronically Signed   By: Richardean Sale M.D.   On: 02/14/2015 16:39   US Carotid Bilateral  02/10/2015  CLINICAL DATA:  Syncope and history of diabetes, coronary artery disease and prior CABG. EXAM: BILATERAL CAROTID DUPLEX ULTRASOUND TECHNIQUE: Pearline Cables scale imaging, color Doppler and duplex ultrasound were performed of bilateral carotid and vertebral arteries in the neck. COMPARISON:  None. FINDINGS: Criteria: Quantification of carotid stenosis is based on velocity parameters that correlate  the residual internal carotid diameter with NASCET-based stenosis levels, using the diameter of the distal internal carotid lumen as the denominator for stenosis measurement. The following velocity measurements were obtained: RIGHT ICA:  73/12 cm/sec CCA:  [id number] cm/sec SYSTOLIC ICA/CCA RATIO:  1.1 DIASTOLIC ICA/CCA RATIO:  1.3 ECA:  164 cm/sec LEFT ICA:  105/18 cm/sec CCA:  [id number] cm/sec SYSTOLIC ICA/CCA RATIO:  1.2 DIASTOLIC ICA/CCA RATIO:  1.0 ECA:  147 cm/sec RIGHT CAROTID ARTERY: Mild amount of calcified plaque is present at the level of the distal bulb and proximal ICA. Velocities and waveforms are normal and estimated right ICA stenosis is less than 50%. RIGHT VERTEBRAL ARTERY: Antegrade flow with normal waveform and velocity. LEFT CAROTID ARTERY: There is a mild amount of calcified plaque at the level of the left carotid bulb and proximal ICA. Overall plaque volume is slightly greater on the left compared to the right. Velocities and waveforms are normal and estimated left ICA stenosis is less than 50%. LEFT VERTEBRAL ARTERY: Antegrade flow with normal waveform and velocity. IMPRESSION: Mild amount of plaque at the level of both carotid bulbs and proximal internal carotid arteries, left greater than right. No significant carotid stenosis is identified with estimated bilateral ICA stenoses of less than 50%. Electronically Signed   By: Aletta Edouard M.D.   On: 02/10/2015 17:15   US Abdomen Limited Ruq  03/01/2015  CLINICAL DATA:  Biliary stent. EXAM: US ABDOMEN LIMITED - RIGHT UPPER QUADRANT COMPARISON:  02/14/2015 FINDINGS: Gallbladder: The gallbladder wall is prominent measuring 2.5 mm in thickness. Gallbladder sludge noted. Negative sonographic Murphy's sign. Common bile duct: Diameter: 13 mm.  Biliary stent is identified. Liver: The liver appears echogenic compatible with hepatic steatosis. No intrahepatic bile duct dilatation Other:  Trace ascites identified. IMPRESSION: 1. Increase caliber of the  common bile duct with stent in place. No intrahepatic duct dilatation. 2. Gallbladder sludge. 3. Hepatic steatosis. Electronically Signed   By: Kerby Moors M.D.   On: 03/01/2015 11:33    ASSESSMENT & PLAN:   #  Pancreatic adenocarcinoma stage IIB- T3 N0; possible invasion of the mesenteric vein on EUS. No evidence of any distant metastatic disease. Patient is leaning away from surgery; he does not want to consider Whipple's as an option given his comorbidities. He declines a surgical evaluation.  # I had a long discussion with the patient and his wife regarding the overall guarded prognosis of stage II pancreatic cancer. In general if patient declined surgery- I would recommend chemotherapy for approximately 3 months  with reevaluation with scan; if scan looked improved - possibly addition of  chemoradiation followed by usually indefinite chemotherapy.   # However patient is concerned about potential side effects of chemotherapy/the recent Sepsis- in which case I think it's reasonable to proceed with chemoradiation upfront; and then based upon a follow-up scan could decide regarding further systemic chemotherapy. He and his family are more open to the latter approach.   # I would recommend Xeloda radiation; discussed the potential side effects of Xeloda including but not limited to diarrhea associated mouth hand foot syndrome. Also discussed the potential side effects of gemcitabine and Abraxane chemotherapy including but not limited to- skin rash, cytopenias, risk of infection and also neuropathy.   # I also reviewed the goals of care- being palliative/ slow down the growth of disease. In general metastatic pancreatic cancer- median survival is 6-12 months; for locally advanced pancreatic cancer- 12-18 months.    Written instructions were given. All questions were answered. The patient knows to call the clinic with any problems, questions or concerns.   # Patient will see me back in  approximately 2 weeks or so; around the time of starting radiation.  We will recheck CBC CMP at that time.    I spent 40 minutes counseling the patient face to face; with more than 50% of time spent on counseling and coordination of care.     Cammie Sickle, MD 03/08/2015 3:23 PM

## 2015-03-08 NOTE — Patient Instructions (Addendum)
Nanoparticle Albumin-Bound Paclitaxel injection What is this medicine? NANOPARTICLE ALBUMIN-BOUND PACLITAXEL (Na no PAHR ti kuhl al BYOO muhn-bound PAK li TAX el) is a chemotherapy drug. It targets fast dividing cells, like cancer cells, and causes these cells to die. This medicine is used to treat advanced breast cancer and advanced lung cancer. This medicine may be used for other purposes; ask your health care provider or pharmacist if you have questions. What should I tell my health care provider before I take this medicine? They need to know if you have any of these conditions: -kidney disease -liver disease -low blood counts, like low platelets, red blood cells, or white blood cells -recent or ongoing radiation therapy -an unusual or allergic reaction to paclitaxel, albumin, other chemotherapy, other medicines, foods, dyes, or preservatives -pregnant or trying to get pregnant -breast-feeding How should I use this medicine? This drug is given as an infusion into a vein. It is administered in a hospital or clinic by a specially trained health care professional. Talk to your pediatrician regarding the use of this medicine in children. Special care may be needed. Overdosage: If you think you have taken too much of this medicine contact a poison control center or emergency room at once. NOTE: This medicine is only for you. Do not share this medicine with others. What if I miss a dose? It is important not to miss your dose. Call your doctor or health care professional if you are unable to keep an appointment. What may interact with this medicine? -cyclosporine -diazepam -ketoconazole -medicines to increase blood counts like filgrastim, pegfilgrastim, sargramostim -other chemotherapy drugs like cisplatin, doxorubicin, epirubicin, etoposide, teniposide, vincristine -quinidine -testosterone -vaccines -verapamil Talk to your doctor or health care professional before taking any of these  medicines: -acetaminophen -aspirin -ibuprofen -ketoprofen -naproxen This list may not describe all possible interactions. Give your health care provider a list of all the medicines, herbs, non-prescription drugs, or dietary supplements you use. Also tell them if you smoke, drink alcohol, or use illegal drugs. Some items may interact with your medicine. What should I watch for while using this medicine? Your condition will be monitored carefully while you are receiving this medicine. You will need important blood work done while you are taking this medicine. This drug may make you feel generally unwell. This is not uncommon, as chemotherapy can affect healthy cells as well as cancer cells. Report any side effects. Continue your course of treatment even though you feel ill unless your doctor tells you to stop. In some cases, you may be given additional medicines to help with side effects. Follow all directions for their use. Call your doctor or health care professional for advice if you get a fever, chills or sore throat, or other symptoms of a cold or flu. Do not treat yourself. This drug decreases your body's ability to fight infections. Try to avoid being around people who are sick. This medicine may increase your risk to bruise or bleed. Call your doctor or health care professional if you notice any unusual bleeding. Be careful brushing and flossing your teeth or using a toothpick because you may get an infection or bleed more easily. If you have any dental work done, tell your dentist you are receiving this medicine. Avoid taking products that contain aspirin, acetaminophen, ibuprofen, naproxen, or ketoprofen unless instructed by your doctor. These medicines may hide a fever. Do not become pregnant while taking this medicine. Women should inform their doctor if they wish to become pregnant or   think they might be pregnant. There is a potential for serious side effects to an unborn child. Talk to  your health care professional or pharmacist for more information. Do not breast-feed an infant while taking this medicine. Men are advised not to father a child while receiving this medicine. What side effects may I notice from receiving this medicine? Side effects that you should report to your doctor or health care professional as soon as possible: -allergic reactions like skin rash, itching or hives, swelling of the face, lips, or tongue -low blood counts - This drug may decrease the number of white blood cells, red blood cells and platelets. You may be at increased risk for infections and bleeding. -signs of infection - fever or chills, cough, sore throat, pain or difficulty passing urine -signs of decreased platelets or bleeding - bruising, pinpoint red spots on the skin, black, tarry stools, nosebleeds -signs of decreased red blood cells - unusually weak or tired, fainting spells, lightheadedness -breathing problems -changes in vision -chest pain -high or low blood pressure -mouth sores -nausea and vomiting -pain, swelling, redness or irritation at the injection site -pain, tingling, numbness in the hands or feet -slow or irregular heartbeat -swelling of the ankle, feet, hands Side effects that usually do not require medical attention (report to your doctor or health care professional if they continue or are bothersome): -aches, pains -changes in the color of fingernails -diarrhea -hair loss -loss of appetite This list may not describe all possible side effects. Call your doctor for medical advice about side effects. You may report side effects to FDA at 1-800-FDA-1088. Where should I keep my medicine? This drug is given in a hospital or clinic and will not be stored at home. NOTE: This sheet is a summary. It may not cover all possible information. If you have questions about this medicine, talk to your doctor, pharmacist, or health care provider.    2016, [employer] Standard.  (2012-06-01 16:48:50)   Gemcitabine injection What is this medicine? GEMCITABINE (jem SIT a been) is a chemotherapy drug. This medicine is used to treat many types of cancer like breast cancer, lung cancer, pancreatic cancer, and ovarian cancer. This medicine may be used for other purposes; ask your health care provider or pharmacist if you have questions. What should I tell my health care provider before I take this medicine? They need to know if you have any of these conditions: -blood disorders -infection -kidney disease -liver disease -recent or ongoing radiation therapy -an unusual or allergic reaction to gemcitabine, other chemotherapy, other medicines, foods, dyes, or preservatives -pregnant or trying to get pregnant -breast-feeding How should I use this medicine? This drug is given as an infusion into a vein. It is administered in a hospital or clinic by a specially trained health care professional. Talk to your pediatrician regarding the use of this medicine in children. Special care may be needed. Overdosage: If you think you have taken too much of this medicine contact a poison control center or emergency room at once. NOTE: This medicine is only for you. Do not share this medicine with others. What if I miss a dose? It is important not to miss your dose. Call your doctor or health care professional if you are unable to keep an appointment. What may interact with this medicine? -medicines to increase blood counts like filgrastim, pegfilgrastim, sargramostim -some other chemotherapy drugs like cisplatin -vaccines Talk to your doctor or health care professional before taking any of these medicines: -acetaminophen -  aspirin -ibuprofen -ketoprofen -naproxen This list may not describe all possible interactions. Give your health care provider a list of all the medicines, herbs, non-prescription drugs, or dietary supplements you use. Also tell them if you smoke, drink alcohol, or  use illegal drugs. Some items may interact with your medicine. What should I watch for while using this medicine? Visit your doctor for checks on your progress. This drug may make you feel generally unwell. This is not uncommon, as chemotherapy can affect healthy cells as well as cancer cells. Report any side effects. Continue your course of treatment even though you feel ill unless your doctor tells you to stop. In some cases, you may be given additional medicines to help with side effects. Follow all directions for their use. Call your doctor or health care professional for advice if you get a fever, chills or sore throat, or other symptoms of a cold or flu. Do not treat yourself. This drug decreases your body's ability to fight infections. Try to avoid being around people who are sick. This medicine may increase your risk to bruise or bleed. Call your doctor or health care professional if you notice any unusual bleeding. Be careful brushing and flossing your teeth or using a toothpick because you may get an infection or bleed more easily. If you have any dental work done, tell your dentist you are receiving this medicine. Avoid taking products that contain aspirin, acetaminophen, ibuprofen, naproxen, or ketoprofen unless instructed by your doctor. These medicines may hide a fever. Women should inform their doctor if they wish to become pregnant or think they might be pregnant. There is a potential for serious side effects to an unborn child. Talk to your health care professional or pharmacist for more information. Do not breast-feed an infant while taking this medicine. What side effects may I notice from receiving this medicine? Side effects that you should report to your doctor or health care professional as soon as possible: -allergic reactions like skin rash, itching or hives, swelling of the face, lips, or tongue -low blood counts - this medicine may decrease the number of white blood cells, red  blood cells and platelets. You may be at increased risk for infections and bleeding. -signs of infection - fever or chills, cough, sore throat, pain or difficulty passing urine -signs of decreased platelets or bleeding - bruising, pinpoint red spots on the skin, black, tarry stools, blood in the urine -signs of decreased red blood cells - unusually weak or tired, fainting spells, lightheadedness -breathing problems -chest pain -mouth sores -nausea and vomiting -pain, swelling, redness at site where injected -pain, tingling, numbness in the hands or feet -stomach pain -swelling of ankles, feet, hands -unusual bleeding Side effects that usually do not require medical attention (report to your doctor or health care professional if they continue or are bothersome): -constipation -diarrhea -hair loss -loss of appetite -stomach upset This list may not describe all possible side effects. Call your doctor for medical advice about side effects. You may report side effects to FDA at 1-800-FDA-1088. Where should I keep my medicine? This drug is given in a hospital or clinic and will not be stored at home. NOTE: This sheet is a summary. It may not cover all possible information. If you have questions about this medicine, talk to your doctor, pharmacist, or health care provider.    2016, [employer] Standard. (2007-08-18 18:45:54)     Capecitabine tablets What is this medicine? CAPECITABINE (ka pe SITE a been) is  a chemotherapy drug. It slows the growth of cancer cells. This medicine is used to treat breast cancer, and also colon or rectal cancer. This medicine may be used for other purposes; ask your health care provider or pharmacist if you have questions. What should I tell my health care provider before I take this medicine? They need to know if you have any of these conditions: -bleeding or blood disorders -dihydropyrimidine dehydrogenase (DPD) deficiency -heart disease -infection  (especially a virus infection such as chickenpox, cold sores, or herpes) -kidney disease -liver disease -an unusual or allergic reaction to capecitabine, 5-fluorouracil, other medicines, foods, dyes, or preservatives -pregnant or trying to get pregnant -breast-feeding How should I use this medicine? Take this medicine by mouth with a glass of water, within 30 minutes of the end of a meal. Do not cut, crush or chew this medicine. Follow the directions on the prescription label. Take your medicine at regular intervals. Do not take it more often than directed. Do not stop taking except on your doctor's advice. Your doctor may want you to take a combination of 150 mg and 500 mg tablets for each dose. It is very important that you know how to correctly take your dose. Taking the wrong tablets could result in an overdose (too much medication) or underdose (too little medication). Talk to your pediatrician regarding the use of this medicine in children. Special care may be needed. Overdosage: If you think you have taken too much of this medicine contact a poison control center or emergency room at once. NOTE: This medicine is only for you. Do not share this medicine with others. What if I miss a dose? If you miss a dose, do not take the missed dose at all. Do not take double or extra doses. Instead, continue with your next scheduled dose and check with your doctor. What may interact with this medicine? -antacids with aluminum and/or magnesium -folic acid -leucovorin -medicines to increase blood counts like filgrastim, pegfilgrastim, sargramostim -phenytoin -vaccines -warfarin Talk to your doctor or health care professional before taking any of these medicines: -acetaminophen -aspirin -ibuprofen -ketoprofen -naproxen This list may not describe all possible interactions. Give your health care provider a list of all the medicines, herbs, non-prescription drugs, or dietary supplements you use. Also  tell them if you smoke, drink alcohol, or use illegal drugs. Some items may interact with your medicine. What should I watch for while using this medicine? Visit your doctor for checks on your progress. This drug may make you feel generally unwell. This is not uncommon, as chemotherapy can affect healthy cells as well as cancer cells. Report any side effects. Continue your course of treatment even though you feel ill unless your doctor tells you to stop. In some cases, you may be given additional medicines to help with side effects. Follow all directions for their use. Call your doctor or health care professional for advice if you get a fever, chills or sore throat, or other symptoms of a cold or flu. Do not treat yourself. This drug decreases your body's ability to fight infections. Try to avoid being around people who are sick. This medicine may increase your risk to bruise or bleed. Call your doctor or health care professional if you notice any unusual bleeding. Be careful brushing and flossing your teeth or using a toothpick because you may get an infection or bleed more easily. If you have any dental work done, tell your dentist you are receiving this medicine. Avoid taking  products that contain aspirin, acetaminophen, ibuprofen, naproxen, or ketoprofen unless instructed by your doctor. These medicines may hide a fever. Do not become pregnant while taking this medicine. Women should inform their doctor if they wish to become pregnant or think they might be pregnant. There is a potential for serious side effects to an unborn child. Talk to your health care professional or pharmacist for more information. Do not breast-feed an infant while taking this medicine. Men are advised not to father a child while taking this medicine. What side effects may I notice from receiving this medicine? Side effects that you should report to your doctor or health care professional as soon as possible: -allergic  reactions like skin rash, itching or hives, swelling of the face, lips, or tongue -low blood counts - this medicine may decrease the number of white blood cells, red blood cells and platelets. You may be at increased risk for infections and bleeding. -signs of infection - fever or chills, cough, sore throat, pain or difficulty passing urine -signs of decreased platelets or bleeding - bruising, pinpoint red spots on the skin, black, tarry stools, blood in the urine -signs of decreased red blood cells - unusually weak or tired, fainting spells, lightheadedness -breathing problems -changes in vision -chest pain -dark urine -diarrhea of more than 4 bowel movements in one day or any diarrhea at night; bloody or watery diarrhea -dizziness -mouth sores -nausea and vomiting -pain, tingling, numbness in the hands or feet -redness, swelling, or sores on hands or feet -stomach pain -vomiting -yellow color of skin or eyes Side effects that usually do not require medical attention (report to your doctor or health care professional if they continue or are bothersome): -constipation -diarrhea -dry or itchy skin -hair loss -loss of appetite -nausea -weak or tired This list may not describe all possible side effects. Call your doctor for medical advice about side effects. You may report side effects to FDA at 1-800-FDA-1088. Where should I keep my medicine? Keep out of the reach of children. Store at room temperature between 15 and 30 degrees C (59 and 86 degrees F). Keep container tightly closed. Throw away any unused medicine after the expiration date. NOTE: This sheet is a summary. It may not cover all possible information. If you have questions about this medicine, talk to your doctor, pharmacist, or health care provider.    2016, [employer] Standard. (2014-05-23 17:03:30)    Chemotherapy Chemotherapy is the use of medicines to stop or slow the growth of cancer cells. Depending on the  type and stage of your cancer, you may have chemotherapy to:  Cure your cancer.  Slow the progression of your cancer.  Ease your cancer symptoms.  Improve the benefits of radiation treatment.  Shrink a tumor before surgery.  Rid the body of cancer cells that remain after a tumor is surgically removed. HOW IS CHEMOTHERAPY GIVEN? Chemotherapy may be given:  By mouth in liquid or pill form.  Through a thin tube that is inserted into a vein or artery.  By getting a shot.  By rubbing a cream or ointment on your skin.  Through liquids that are placed directly into various areas of the body, such as the abdomen, chest, or bladder. HOW OFTEN IS CHEMOTHERAPY GIVEN? Chemotherapy may be given continuously over time, or it may be given in cycles. For example, you may take the medicine for one week out of every month. FOR HOW LONG WILL I NEED CHEMOTHERAPY TREATMENTS? The length of treatment  depends on many factors, including:  The type of cancer.  Whether the cancer has spread.  How you respond to the chemotherapy.  Whether you develop side effects. Some types of chemotherapy medicine are given only one time. Others are given for months, years, or for life. WHAT SAFETY PRECAUTIONS MUST I TAKE WHILE ON CHEMOTHERAPY? Chemotherapy medicines are very strong. They will be in all of your bodily fluids, including your urine, stool, saliva, sweat, tears, vaginal secretions, and semen. You must carefully follow some safety precautions to prevent harm to others while you are using these medicines. Here are some recommended precautions:  Make sure that people who help care for you wear disposable gloves if they are going to come into contact with any of your bodily fluids. Women who are pregnant or breastfeeding should not handle any of your bodily fluids.  Wash any clothes, towels, and linens that may have your bodily fluids on them twice in a washing machine using very hot water.  Dispose of  adult diapers, tampons, and sanitary napkins by first sealing them in a plastic bag.  Use a condom when having sex for at least 2 weeks after receiving your chemotherapy.  Do not share beverages or food.  Keep your chemotherapy medicines in their original bottles. Keep them in a high, safe location, away from children. Do not expose them to heat or moisture. Do not put them in containers with other types of medicines.  Dispose of all wrappers for your chemotherapy medicines by sealing them in a separate plastic bag.  Do not throw away extra medicine, and do not flush it down the toilet. Take medicine that you are not going to use to your health care provider's office where it can be disposed of properly.  Follow your health care provider's directions for the proper disposal of needles, IV tubing, and other medical supplies that have come into contact with your chemotherapy medicines.  If you are issued a hazardous waste container, make sure you understand the directions for using it.  Wash your hands thoroughly with warm water and soap after using the bathroom. Dry your hands with disposable paper towels.  When using the toilet:  Flush it twice after each use, including after vomiting.  Close the lid of the toilet prior to flushing. This helps to avoid splashing.  Both men and women should sit to use the toilet. This helps avoid splashing. WHAT ARE THE SIDE EFFECTS OF CHEMOTHERAPY? Side effects depend on a variety of factors, including:  The specific type of chemotherapy medicine used.  The dosage.  How long the medicine is used for.  Your overall health. Some of the side effects you may experience include:  Fatigue and decreased energy.  Decreased appetite.  Changes in your sense of smell or taste.  Nausea.  Vomiting.  Constipation or diarrhea.  Hair loss.  Increased susceptibility to infection.  Easy bleeding.  Mouth sores.  Burning or tingling in the hands  or feet.  Memory problems.   This information is not intended to replace advice given to you by your health care provider. Make sure you discuss any questions you have with your health care provider.   Document Released: 02/03/2007 Document Revised: 04/29/2014 Document Reviewed: 09/14/2013 [employer] Interactive Patient Education [employer].

## 2015-03-09 ENCOUNTER — Encounter: Payer: Self-pay | Admitting: Radiation Oncology

## 2015-03-09 ENCOUNTER — Ambulatory Visit
Admission: RE | Admit: 2015-03-09 | Discharge: 2015-03-09 | Disposition: A | Discharge status: Home / Self Care | Payer: Commercial Managed Care - HMO | Source: Ambulatory Visit | Attending: Radiation Oncology | Admitting: Radiation Oncology

## 2015-03-09 VITALS — BP 160/81 | HR 77 | Temp 96.8°F | Resp 18 | Wt 205.8 lb

## 2015-03-09 DIAGNOSIS — C250 Malignant neoplasm of head of pancreas: Secondary | ICD-10-CM

## 2015-03-09 DIAGNOSIS — Z510 Encounter for antineoplastic radiation therapy: Secondary | ICD-10-CM | POA: Insufficient documentation

## 2015-03-09 NOTE — Consult Note (Signed)
Except an outstanding is perfect of Radiation Oncology NEW PATIENT EVALUATION  Name: [name]  MRN: [id number]  Date:   03/09/2015     DOB: 1940-07-23   This 74 y.o. 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  REFERRING PHYSICIAN: Rusty Aus, MD  CHIEF COMPLAINT:  Chief Complaint  Patient presents with  . Pancreatic Cancer    Pt is here today for initial consultation of pancreatic cancer    DIAGNOSIS: The encounter diagnosis was Malignant neoplasm of head of pancreas ([place]).   PREVIOUS INVESTIGATIONS:  CT scans reviewed Pathology report reviewed Clinical notes reviewed Case presented at weekly tumor conference  HPI: Patient is a 74 year old male who presented with painless jaundice. His CA 19-9 was 4000 initially. Patient underwent stenting. Endoscopic ultrasound was performed and cytology was positive for adenocarcinoma. Tumor abutted and possibly had early invasion of the superior mesenteric vein by CT criteria. He was admitted to the hospital for generalized weakness found to have sepsis and model renal failure which she has recovered from. Initially thought was to go ahead with systemic chemotherapy. Patient has declined any surgical intervention and declined systemic chemotherapy at this time. He is now referred to radiation oncology for opinion. He is doing fairly well no evidence of jaundice at this time. Patient is lost about 8 pounds although not having any significant nausea or at this time. Bowels tend towards constipation. He is not having any abdominal pain at this point.  PLANNED TREATMENT REGIMEN: I MRT radiation therapy with concurrent oral Xeloda  PAST MEDICAL HISTORY:  has a past medical history of Hyperlipidemia; H/O cardiac catheterization (06/03/06,12/03/11); BPH (benign prostatic hypertrophy); Anxiety; Syncope; Myocardial infarction [facility]); CHF (congestive heart failure) ([place]); CAD (coronary artery  disease) (06/03/06); Asthma; Sleep apnea; Kidney stone; GERD (gastroesophageal reflux disease); Cancer ([place]); Arthritis; Anemia; Cataract, bilateral; Neuromuscular disorder ([place]); and Diabetes mellitus without complication ([place]).    PAST SURGICAL HISTORY:  Past Surgical History  Procedure Laterality Date  . Spine surgery  9/05    CERVICAL LAMINECTOMY  . Cervical herniated    . Eye surgery      as a child  . Coronary artery bypass graft  12/12/2011    Procedure: CORONARY ARTERY BYPASS GRAFTING (CABG);  Surgeon: Gaye Pollack, MD;  Location: [facility];  Service: Open Heart Surgery;  Laterality: N/A;  coronary artery bypass graft times four using left internal mammary artery, right leg greater saphenous vein harvested endoscopically  . Cardiac catheterization N/A 10/26/2014    Procedure: Left Heart Cath;  Surgeon: Isaias Cowman, MD;  Location: [place] CV [facility];  Service: Cardiovascular;  Laterality: N/A;  . Endoscopic retrograde cholangiopancreatography (ercp) with propofol N/A 02/13/2015    Procedure: ENDOSCOPIC RETROGRADE CHOLANGIOPANCREATOGRAPHY (ERCP) WITH PROPOFOL;  Surgeon: Lucilla Lame, MD;  Location: [facility] ENDOSCOPY;  Service: Endoscopy;  Laterality: N/A;  . Eus N/A 02/23/2015    Procedure: UPPER ENDOSCOPIC ULTRASOUND (EUS) LINEAR;  Surgeon: Holly Bodily, MD;  Location: [facility] ENDOSCOPY;  Service: Gastroenterology;  Laterality: N/A;    FAMILY HISTORY: family history includes Diabetes Mellitus II in his brother, mother, and sister; Leukemia in his father; Pancreatic cancer in his mother.  SOCIAL HISTORY:  reports that he quit smoking about 37 years ago. His smoking use included Cigarettes and Cigars. He has never used smokeless tobacco. He reports that he does not drink alcohol or use illicit drugs.  ALLERGIES: Zithromax and Lisinopril  MEDICATIONS:  Current Outpatient  Prescriptions  Medication Sig Dispense Refill  . acetaminophen (TYLENOL) 650 MG CR tablet Take 1,300 mg by  mouth every 8 (eight) hours as needed for pain.    Marland Kitchen aspirin EC 81 MG tablet Take 81 mg by mouth daily.    . capecitabine (XELODA) 500 MG tablet Take 500 mg by mouth 4 (four) times daily. Patient will take 2 pills in am and 2 pill in pm during radiation therapy.  RX sent on 03/09/15 to [facility]. Patient has pancreatic cancer    . cephALEXin (KEFLEX) 500 MG capsule Take 1 capsule (500 mg total) by mouth 3 (three) times daily. 30 capsule 0  . gabapentin (NEURONTIN) 300 MG capsule Take 300 mg by mouth 2 (two) times daily.    Marland Kitchen HYDROcodone-acetaminophen (NORCO/VICODIN) 5-325 MG tablet Take 1 tablet by mouth every 6 (six) hours as needed. For pain.    Marland Kitchen insulin glargine (LANTUS) 100 UNIT/ML injection Inject 40 Units into the skin 2 (two) times daily.    . insulin regular (NOVOLIN R,HUMULIN R) 100 units/mL injection Inject 5-15 Units into the skin 3 (three) times daily before meals. Pt uses as needed per sliding scale.    . lovastatin (MEVACOR) 20 MG tablet Take 20 mg by mouth at bedtime.    Marland Kitchen omeprazole (PRILOSEC) 20 MG capsule Take 20 mg by mouth 2 (two) times daily.    . polyethylene glycol powder (GLYCOLAX/MIRALAX) powder Take 17 g by mouth daily as needed. Constipation.     No current facility-administered medications for this encounter.    ECOG PERFORMANCE STATUS:  0 - Asymptomatic  REVIEW OF SYSTEMS:  Patient denies any weight loss, fatigue, weakness, fever, chills or night sweats. Patient denies any loss of vision, blurred vision. Patient denies any ringing  of the ears or hearing loss. No irregular heartbeat. Patient denies heart murmur or history of fainting. Patient denies any chest pain or pain radiating to her upper extremities. Patient denies any shortness of breath, difficulty breathing at night, cough or hemoptysis. Patient denies any swelling in the lower legs. Patient denies any nausea vomiting, vomiting of blood, or coffee ground material in the vomitus. Patient denies any stomach pain.  Patient states has had normal bowel movements no significant constipation or diarrhea. Patient denies any dysuria, hematuria or significant nocturia. Patient denies any problems walking, swelling in the joints or loss of balance. Patient denies any skin changes, loss of hair or loss of weight. Patient denies any excessive worrying or anxiety or significant depression. Patient denies any problems with insomnia. Patient denies excessive thirst, polyuria, polydipsia. Patient denies any swollen glands, patient denies easy bruising or easy bleeding. Patient denies any recent infections, allergies or URI. Patient "s visual fields have not changed significantly in recent time.    PHYSICAL EXAM: BP 160/81 mmHg  Pulse 77  Temp(Src) 96.8 F (36 C)  Resp 18  Wt 205 lb 12.8 oz (93.35 kg) Well-developed slightly obese male in NAD. No cervical or supra clavicular adenopathy is appreciated. No pain is elicited on deep palpation of the abdominal cavity. He has no evidence of scleral icterus or jaundice. Well-developed well-nourished patient in NAD. HEENT reveals PERLA, EOMI, discs not visualized.  Oral cavity is clear. No oral mucosal lesions are identified. Neck is clear without evidence of cervical or supraclavicular adenopathy. Lungs are clear to A&P. Cardiac examination is essentially unremarkable with regular rate and rhythm without murmur rub or thrill. Abdomen is benign with no organomegaly or masses noted. Motor sensory and DTR  levels are equal and symmetric in the upper and lower extremities. Cranial nerves II through XII are grossly intact. Proprioception is intact. No peripheral adenopathy or edema is identified. No motor or sensory levels are noted. Crude visual fields are within normal range.   LABORATORY DATA: Cytology report reviewed    RADIOLOGY RESULTS: CT scans reviewed   IMPRESSION: Stage IIB adenocarcinoma the pancreatic head in 74 year old male for concurrent oral Xeloda and I MRT radiation  therapy  PLAN: At this time I believe based on his declining surgery as well as systemic chemotherapy he would be a good candidate for radiation therapy with oral Xeloda. I will plan on delivering 5400 cGy over 5 weeks using I am RT radiation therapy to spare critical structures such as his kidneys liver spinal cord and small bowel. Risks and benefits of treatment including possible nausea possible diarrhea possible alteration of blood counts possible fatigue and skin reaction all were discussed in detail with the patient and his family. They seem to comprehend my treatment plan well. I have set up and ordered CT simulation on the patient early next week. We'll coordinate his oral Xeloda with medical oncology.  I would like to take this opportunity for allowing me to participate in the care of your patient.Armstead Peaks., MD

## 2015-03-13 ENCOUNTER — Ambulatory Visit
Admission: RE | Admit: 2015-03-13 | Discharge: 2015-03-13 | Disposition: A | Discharge status: Home / Self Care | Payer: Commercial Managed Care - HMO | Source: Ambulatory Visit | Attending: Radiation Oncology | Admitting: Radiation Oncology

## 2015-03-13 DIAGNOSIS — Z510 Encounter for antineoplastic radiation therapy: Secondary | ICD-10-CM | POA: Diagnosis present

## 2015-03-13 DIAGNOSIS — C250 Malignant neoplasm of head of pancreas: Secondary | ICD-10-CM | POA: Diagnosis not present

## 2015-03-14 ENCOUNTER — Encounter: Payer: Self-pay | Admitting: *Deleted

## 2015-03-14 ENCOUNTER — Other Ambulatory Visit: Payer: Commercial Managed Care - HMO

## 2015-03-14 NOTE — Progress Notes (Signed)
RN Was notified from [place]. Patient needs assistance for the xeloda. There is a form to complete for this. Notified Dr. Rogue Bussing.

## 2015-03-15 ENCOUNTER — Encounter: Payer: Self-pay | Admitting: *Deleted

## 2015-03-21 DIAGNOSIS — Z510 Encounter for antineoplastic radiation therapy: Secondary | ICD-10-CM | POA: Diagnosis not present

## 2015-03-23 ENCOUNTER — Inpatient Hospital Stay (HOSPITAL_BASED_OUTPATIENT_CLINIC_OR_DEPARTMENT_OTHER): Payer: Commercial Managed Care - HMO | Admitting: Internal Medicine

## 2015-03-23 ENCOUNTER — Inpatient Hospital Stay: Payer: Commercial Managed Care - HMO

## 2015-03-23 ENCOUNTER — Inpatient Hospital Stay: Payer: Commercial Managed Care - HMO | Attending: Internal Medicine

## 2015-03-23 ENCOUNTER — Encounter: Payer: Self-pay | Admitting: Internal Medicine

## 2015-03-23 VITALS — BP 107/62 | HR 90 | Temp 99.0°F | Wt 203.7 lb

## 2015-03-23 DIAGNOSIS — K831 Obstruction of bile duct: Secondary | ICD-10-CM

## 2015-03-23 DIAGNOSIS — Z79899 Other long term (current) drug therapy: Secondary | ICD-10-CM | POA: Diagnosis not present

## 2015-03-23 DIAGNOSIS — K4090 Unilateral inguinal hernia, without obstruction or gangrene, not specified as recurrent: Secondary | ICD-10-CM | POA: Insufficient documentation

## 2015-03-23 DIAGNOSIS — E1140 Type 2 diabetes mellitus with diabetic neuropathy, unspecified: Secondary | ICD-10-CM

## 2015-03-23 DIAGNOSIS — Z800 Family history of malignant neoplasm of digestive organs: Secondary | ICD-10-CM | POA: Insufficient documentation

## 2015-03-23 DIAGNOSIS — J9811 Atelectasis: Secondary | ICD-10-CM | POA: Diagnosis not present

## 2015-03-23 DIAGNOSIS — I517 Cardiomegaly: Secondary | ICD-10-CM

## 2015-03-23 DIAGNOSIS — R978 Other abnormal tumor markers: Secondary | ICD-10-CM | POA: Insufficient documentation

## 2015-03-23 DIAGNOSIS — K760 Fatty (change of) liver, not elsewhere classified: Secondary | ICD-10-CM

## 2015-03-23 DIAGNOSIS — Z87891 Personal history of nicotine dependence: Secondary | ICD-10-CM

## 2015-03-23 DIAGNOSIS — K219 Gastro-esophageal reflux disease without esophagitis: Secondary | ICD-10-CM | POA: Insufficient documentation

## 2015-03-23 DIAGNOSIS — E785 Hyperlipidemia, unspecified: Secondary | ICD-10-CM | POA: Diagnosis not present

## 2015-03-23 DIAGNOSIS — I252 Old myocardial infarction: Secondary | ICD-10-CM

## 2015-03-23 DIAGNOSIS — Z85828 Personal history of other malignant neoplasm of skin: Secondary | ICD-10-CM | POA: Diagnosis not present

## 2015-03-23 DIAGNOSIS — N400 Enlarged prostate without lower urinary tract symptoms: Secondary | ICD-10-CM

## 2015-03-23 DIAGNOSIS — Z794 Long term (current) use of insulin: Secondary | ICD-10-CM

## 2015-03-23 DIAGNOSIS — M129 Arthropathy, unspecified: Secondary | ICD-10-CM

## 2015-03-23 DIAGNOSIS — I509 Heart failure, unspecified: Secondary | ICD-10-CM | POA: Diagnosis not present

## 2015-03-23 DIAGNOSIS — Z7982 Long term (current) use of aspirin: Secondary | ICD-10-CM | POA: Diagnosis not present

## 2015-03-23 DIAGNOSIS — C259 Malignant neoplasm of pancreas, unspecified: Secondary | ICD-10-CM

## 2015-03-23 DIAGNOSIS — R531 Weakness: Secondary | ICD-10-CM

## 2015-03-23 DIAGNOSIS — R42 Dizziness and giddiness: Secondary | ICD-10-CM | POA: Diagnosis not present

## 2015-03-23 DIAGNOSIS — C250 Malignant neoplasm of head of pancreas: Secondary | ICD-10-CM

## 2015-03-23 DIAGNOSIS — I2510 Atherosclerotic heart disease of native coronary artery without angina pectoris: Secondary | ICD-10-CM

## 2015-03-23 DIAGNOSIS — G4730 Sleep apnea, unspecified: Secondary | ICD-10-CM | POA: Insufficient documentation

## 2015-03-23 DIAGNOSIS — Z5111 Encounter for antineoplastic chemotherapy: Secondary | ICD-10-CM | POA: Diagnosis present

## 2015-03-23 DIAGNOSIS — D649 Anemia, unspecified: Secondary | ICD-10-CM | POA: Diagnosis not present

## 2015-03-23 DIAGNOSIS — Z87442 Personal history of urinary calculi: Secondary | ICD-10-CM | POA: Insufficient documentation

## 2015-03-23 DIAGNOSIS — F419 Anxiety disorder, unspecified: Secondary | ICD-10-CM

## 2015-03-23 LAB — COMPREHENSIVE METABOLIC PANEL
ALBUMIN: 3 g/dL — AB (ref 3.5–5.0)
ALT: 23 U/L (ref 17–63)
AST: 31 U/L (ref 15–41)
Alkaline Phosphatase: 133 U/L — ABNORMAL HIGH (ref 38–126)
Anion gap: 7 (ref 5–15)
BILIRUBIN TOTAL: 1.1 mg/dL (ref 0.3–1.2)
BUN: 22 mg/dL — AB (ref 6–20)
CHLORIDE: 105 mmol/L (ref 101–111)
CO2: 25 mmol/L (ref 22–32)
CREATININE: 1.55 mg/dL — AB (ref 0.61–1.24)
Calcium: 8.3 mg/dL — ABNORMAL LOW (ref 8.9–10.3)
GFR calc Af Amer: 49 mL/min — ABNORMAL LOW (ref >60)
GFR, EST NON AFRICAN AMERICAN: 42 mL/min — AB (ref >60)
GLUCOSE: 288 mg/dL — AB (ref 65–99)
POTASSIUM: 4 mmol/L (ref 3.5–5.1)
Sodium: 137 mmol/L (ref 135–145)
Total Protein: 6.5 g/dL (ref 6.5–8.1)

## 2015-03-23 LAB — CBC WITH DIFFERENTIAL/PLATELET
BASOS ABS: 0 10*3/uL (ref 0–0.1)
BASOS PCT: 0 %
EOS ABS: 0.2 10*3/uL (ref 0–0.7)
EOS PCT: 3 %
HEMATOCRIT: 33.4 % — AB (ref 40.0–52.0)
Hemoglobin: 10.7 g/dL — ABNORMAL LOW (ref 13.0–18.0)
Lymphocytes Relative: 19 %
Lymphs Abs: 1.2 10*3/uL (ref 1.0–3.6)
MCH: 27.5 pg (ref 26.0–34.0)
MCHC: 32 g/dL (ref 32.0–36.0)
MCV: 85.9 fL (ref 80.0–100.0)
MONO ABS: 0.4 10*3/uL (ref 0.2–1.0)
Monocytes Relative: 6 %
Neutro Abs: 4.8 10*3/uL (ref 1.4–6.5)
Neutrophils Relative %: 72 %
PLATELETS: 156 10*3/uL (ref 150–440)
RBC: 3.89 MIL/uL — AB (ref 4.40–5.90)
RDW: 16.7 % — ABNORMAL HIGH (ref 11.5–14.5)
WBC: 6.7 10*3/uL (ref 3.8–10.6)

## 2015-03-23 NOTE — Progress Notes (Signed)
The patient and his wife express deep financial concerns. Xeloda copay is $480/30 day supply. Wife states that she told [employer] not to ship the drug due to the copay.  The patient has opted for IV gemzar/radiation; however would like to meet with cancer center financial counselor asap and Elease Etienne, social worker to discuss financial concerns. The only income they have it the social security checks, which is used to pay health insurance and house bills. They are deeply distressed over these concerns and worry about the financial toll that chemotherapy and radiation will have on them. A social worker consult was placed today and I spoke to the cancer center financial counselor. The patient was able to go to chemotherapy class today as an add on. Dr. Rogue Bussing would like patient to start IV gemzar next week in coordination with patient's radiation therapy tx.

## 2015-03-23 NOTE — Progress Notes (Signed)
[place] CONSULT NOTE  Patient Care Team: Rusty Aus, MD as PCP - General (Unknown Physician Specialty) Clent Jacks, RN as Registered Nurse  CHIEF COMPLAINTS/PURPOSE OF CONSULTATION:  # OCT 2016- PANCREATIC ADENO CA STAGE IIB [EUS-T3N0- abutting/early invasion of SMV]; CT- no distant mets. NOV 2016- ca-19-9- 986; II week Dec- start RT- ? gemcitabine  # OBSTRUCTIVE JAUNDICE s/p ERCP [s/p stenting; Dr.Wohl; Dr.Rein- ca-19-9- 4032;Oct 2016]  HISTORY OF PRESENTING ILLNESS:  [name] 74 y.o.  male recent diagnosis of pancreas cancer T3 N0 based on EUS is here to review further treatment options.  In the interim was evaluated by radiation/Dr. Donella Stade; plan to start chemotherapy along with radiation on a palliative basis/local control.  Patient has not had any admission to the hospital. Overall feels okay denies any unusual abdominal pain nausea vomiting. Denies any skin itching.  ROS: He still feels mildly fatigued. He has mild swelling in the legs. A complete 10 point review of system is done which is negative except mentioned above in history of present illness.   MEDICAL HISTORY:  Past Medical History  Diagnosis Date  . Hyperlipidemia   . H/O cardiac catheterization 06/03/06,12/03/11  . BPH (benign prostatic hypertrophy)   . Anxiety   . Syncope     LAUGHING INDUCED  . Myocardial infarction ([place])     x 2  . CHF (congestive heart failure) ([place])   . CAD (coronary artery disease) 06/03/06    cypher 2.5 x 11mm DES for 80% mid RCA sees Dr. Ubaldo Glassing  . Asthma     as a child  . Sleep apnea     hx of  . Kidney stone     hx of  . GERD (gastroesophageal reflux disease)     on medication for  . Cancer ([place])     hx of skin ca in "corner of left eye"  . Arthritis   . Anemia     taking iron  . Cataract, bilateral   . Neuromuscular disorder ([place])     diabetic neuropathy  . Diabetes mellitus without complication ([place])   . Pancreatic adenocarcinoma [facility])      SURGICAL HISTORY: Past Surgical History  Procedure Laterality Date  . Spine surgery  9/05    CERVICAL LAMINECTOMY  . Cervical herniated    . Eye surgery      as a child  . Coronary artery bypass graft  12/12/2011    Procedure: CORONARY ARTERY BYPASS GRAFTING (CABG);  Surgeon: Gaye Pollack, MD;  Location: [place];  Service: Open Heart Surgery;  Laterality: N/A;  coronary artery bypass graft times four using left internal mammary artery, right leg greater saphenous vein harvested endoscopically  . Cardiac catheterization N/A 10/26/2014    Procedure: Left Heart Cath;  Surgeon: Isaias Cowman, MD;  Location: [place] CV LAB;  Service: Cardiovascular;  Laterality: N/A;  . Endoscopic retrograde cholangiopancreatography (ercp) with propofol N/A 02/13/2015    Procedure: ENDOSCOPIC RETROGRADE CHOLANGIOPANCREATOGRAPHY (ERCP) WITH PROPOFOL;  Surgeon: Lucilla Lame, MD;  Location: [facility] ENDOSCOPY;  Service: Endoscopy;  Laterality: N/A;  . Eus N/A 02/23/2015    Procedure: UPPER ENDOSCOPIC ULTRASOUND (EUS) LINEAR;  Surgeon: Holly Bodily, MD;  Location: [facility] ENDOSCOPY;  Service: Gastroenterology;  Laterality: N/A;    SOCIAL HISTORY: Social History   Social History  . Marital Status: Married    Spouse Name: N/A  . Number of Children: N/A  . Years of Education: N/A   Occupational History  .  Not on file.   Social History Main Topics  . Smoking status: Former Smoker    Types: Cigarettes, Cigars    Quit date: 12/03/1977  . Smokeless tobacco: Never Used  . Alcohol Use: No  . Drug Use: No  . Sexual Activity: Not on file   Other Topics Concern  . Not on file   Social History Narrative   Lives with his wife    FAMILY HISTORY: Family History  Problem Relation Age of Onset  . Diabetes Mellitus II Mother     Also Brother and sister  . Pancreatic cancer Mother   . Leukemia Father   . Diabetes Mellitus II Sister   . Diabetes Mellitus II Brother     ALLERGIES:  is allergic  to zithromax and lisinopril.  MEDICATIONS:  Current Outpatient Prescriptions  Medication Sig Dispense Refill  . acetaminophen (TYLENOL) 650 MG CR tablet Take 1,300 mg by mouth every 8 (eight) hours as needed for pain.    Marland Kitchen aspirin EC 81 MG tablet Take 81 mg by mouth daily.    . capecitabine (XELODA) 500 MG tablet Take 500 mg by mouth 4 (four) times daily. Patient will take 2 pills in am and 2 pill in pm during radiation therapy.  [facility] on 03/09/15 [facility]. Patient has pancreatic cancer    . cephALEXin (KEFLEX) 500 MG capsule Take 1 capsule (500 mg total) by mouth 3 (three) times daily. 30 capsule 0  . gabapentin (NEURONTIN) 300 MG capsule Take 300 mg by mouth 2 (two) times daily.    Marland Kitchen HYDROcodone-acetaminophen (NORCO/VICODIN) 5-325 MG tablet Take 1 tablet by mouth every 6 (six) hours as needed. For pain.    Marland Kitchen insulin glargine (LANTUS) 100 UNIT/ML injection Inject 40 Units into the skin 2 (two) times daily.    . insulin regular (NOVOLIN R,HUMULIN R) 100 units/mL injection Inject 5-15 Units into the skin 3 (three) times daily before meals. Pt uses as needed per sliding scale.    . lovastatin (MEVACOR) 20 MG tablet Take 20 mg by mouth at bedtime.    Marland Kitchen omeprazole (PRILOSEC) 20 MG capsule Take 20 mg by mouth 2 (two) times daily.    . polyethylene glycol powder (GLYCOLAX/MIRALAX) powder Take 17 g by mouth daily as needed. Constipation.     No current facility-administered medications for this visit.      Marland Kitchen  PHYSICAL EXAMINATION: ECOG PERFORMANCE STATUS: 1 - Symptomatic but completely ambulatory  Filed Vitals:   03/23/15 0854  BP: 107/62  Pulse: 90  Temp: 99 F (37.2 C)   Filed Weights   03/23/15 0854  Weight: 203 lb 11.3 oz (92.4 kg)    GENERAL: Well-nourished well-developed; Alert, no distress and comfortable.   Accompanied by wife. EYES: No pallor nor icterus OROPHARYNX: no thrush or ulceration; poor dentition NECK: supple, no masses felt LYMPH:  no palpable lymphadenopathy  in the cervical, axillary or inguinal regions LUNGS: clear to auscultation and  No wheeze or crackles HEART/CVS: regular rate & rhythm and no murmurs; 1+ bilateral lower extremity edema ABDOMEN: abdomen soft, non-tender and normal bowel sounds Musculoskeletal:no cyanosis of digits and no clubbing  PSYCH: alert & oriented x 3 with fluent speech NEURO: no focal motor/sensory deficits SKIN:  no rashes or significant lesions  LABORATORY DATA:  I have reviewed the data as listed Lab Results  Component Value Date   WBC 6.7 03/23/2015   HGB 10.7* 03/23/2015   HCT 33.4* 03/23/2015   MCV 85.9 03/23/2015  PLT 156 03/23/2015    Recent Labs  02/25/15 1204 02/26/15 0448 02/27/15 0213 02/28/15 0413 03/23/15 0837  NA 139 138 140 140 137  K 3.7 3.6 3.7 3.4* 4.0  CL 107 108 107 103 105  CO2 25 26 26 27 25   GLUCOSE 122* 208* 158* 152* 288*  BUN 26* 26* 25* 25* 22*  CREATININE 2.02* 1.68* 1.72* 1.58* 1.55*  CALCIUM 8.0* 7.9* 8.4* 8.8* 8.3*  GFRNONAA 31* 38* 37* 41* 42*  GFRAA 36* 45* 43* 48* 49*  PROT 6.3* 6.2*  --  6.7 6.5  ALBUMIN 2.6* 2.4*  --  2.6* 3.0*  AST 96* 50*  --  32 31  ALT 55 42  --  30 23  ALKPHOS 251* 209*  --  211* 133*  BILITOT 3.4* 2.6*  --  2.3* 1.1  BILIDIR 1.8*  --   --  1.1*  --   IBILI 1.6*  --   --  1.2*  --     RADIOGRAPHIC STUDIES: I have personally reviewed the radiological images as listed and agreed with the findings in the report. Dg Chest 2 View  02/25/2015  CLINICAL DATA:  74 year old male with a history of weakness and dizziness EXAM: CHEST - 2 VIEW COMPARISON:  01/22/2015, 10/26/2014 FINDINGS: Cardiomediastinal silhouette unchanged, with borderline cardiomegaly. Atherosclerotic calcifications of the aortic arch. Surgical changes of median sternotomy. Surgical changes of the cervical region again noted. Right cervical rib. Indistinctness of the central vasculature, particularly on the lateral view with interlobular septal opacities. No pleural  effusion.  No confluent airspace disease. No displaced fracture. Unremarkable appearance of the upper abdomen. IMPRESSION: Low lung volumes, with appearance of developing pulmonary vascular congestion/ early edema. No lobar pneumonia or pleural effusion. Surgical changes of prior median sternotomy. Atherosclerosis. Signed, Dulcy Fanny. Earleen Newport, DO [facility] Radiology Electronically Signed   By: Corrie Mckusick D.O.   On: 02/25/2015 12:30   Ct Chest Wo Contrast  02/25/2015  CLINICAL DATA:  74 year old male with weakness and chest pain over the past 2 weeks as well as shortness of breath on exertion. EXAM: CT CHEST WITHOUT CONTRAST TECHNIQUE: Multidetector CT imaging of the chest was performed following the standard protocol without IV contrast. COMPARISON:  Chest x-ray earlier today ; recent prior CT of the abdomen and pelvis including the lung bases 02/14/2015 FINDINGS: Mediastinum: Unremarkable CT appearance of the thyroid gland. No suspicious mediastinal or hilar adenopathy. No soft tissue mediastinal mass. The thoracic esophagus is unremarkable. Heart/Vascular: Limited evaluation in the absence of intravenous contrast. Atherosclerotic calcifications noted throughout the transverse aorta and coronary arteries. 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 Mild cardiomegaly. No pericardial effusion. No aortic aneurysm. Normal caliber main pulmonary artery. Lungs/Pleura: Mild respiratory motion artifact. Mild dependent atelectasis. No focal airspace consolidation, evidence of pulmonary edema or suspicious pulmonary mass or nodule. Mild plaque-like calcification of the posterior pleura on the left. Healed median sternotomy. Bones/Soft Tissues: No acute fracture or aggressive appearing lytic or blastic osseous lesion. Upper Abdomen: Visualized upper abdominal organs are unremarkable. Expected pneumobilia given  history of a biliary stent. Otherwise, unremarkable visualized upper abdomen. IMPRESSION: 1. No acute abnormality on this noncontrast CT scan of the chest. 2. Stable cardiomegaly with surgical changes of multivessel CABG. 3. Mild dependent atelectasis. 4. Plaque-like calcifications in the posterior pleural on the left. Electronically Signed   By: Jacqulynn Cadet M.D.   On: 02/25/2015 13:36   US  Abdomen Limited Ruq  03/01/2015  CLINICAL DATA:  Biliary stent. EXAM: US ABDOMEN LIMITED - RIGHT UPPER QUADRANT COMPARISON:  02/14/2015 FINDINGS: Gallbladder: The gallbladder wall is prominent measuring 2.5 mm in thickness. Gallbladder sludge noted. Negative sonographic Murphy's sign. Common bile duct: Diameter: 13 mm.  Biliary stent is identified. Liver: The liver appears echogenic compatible with hepatic steatosis. No intrahepatic bile duct dilatation Other:  Trace ascites identified. IMPRESSION: 1. Increase caliber of the common bile duct with stent in place. No intrahepatic duct dilatation. 2. Gallbladder sludge. 3. Hepatic steatosis. Electronically Signed   By: Kerby Moors M.D.   On: 03/01/2015 11:33    ASSESSMENT & PLAN:   #  Pancreatic adenocarcinoma stage IIB- T3 N0; possible invasion of the mesenteric vein on EUS. No evidence of any distant metastatic disease. Patient had declined surgical evaluation.  # Proceed with radiation along with chemotherapy. However, patient has financial issues with oral Xeloda. Instead I would recommend gemcitabine on a weekly basis during the time of radiation. Likely plan start gemcitabine next week.  # Discussed the potential side effects of gemcitabine including but not limited to thrombocytopenia, skin rash and fevers. Patient wife concerned about financial issues. Meet with social worker/financials.   # Reviewed the blood counts CBC mild anemia otherwise unremarkable; bilirubin is normal; creatinine stable around 1.5   # Patient will see me back in  approximately in 2 weeks/along with his second cycle of weekly chemotherapy.  #  Check CBC CMP prior to second cycle of chemotherapy.    Cammie Sickle, MD 03/23/2015 9:02 AM

## 2015-03-23 NOTE — Patient Instructions (Signed)

## 2015-03-27 ENCOUNTER — Inpatient Hospital Stay: Payer: Commercial Managed Care - HMO

## 2015-03-27 ENCOUNTER — Other Ambulatory Visit: Payer: Self-pay

## 2015-03-27 ENCOUNTER — Ambulatory Visit
Admission: RE | Admit: 2015-03-27 | Discharge: 2015-03-27 | Disposition: A | Discharge status: Home / Self Care | Payer: Commercial Managed Care - HMO | Source: Ambulatory Visit | Attending: Radiation Oncology | Admitting: Radiation Oncology

## 2015-03-27 VITALS — BP 180/79 | HR 90 | Resp 20

## 2015-03-27 DIAGNOSIS — Z5111 Encounter for antineoplastic chemotherapy: Secondary | ICD-10-CM | POA: Diagnosis not present

## 2015-03-27 DIAGNOSIS — C250 Malignant neoplasm of head of pancreas: Secondary | ICD-10-CM

## 2015-03-27 MED ORDER — SODIUM CHLORIDE 0.9 % IV SOLN
Freq: Once | INTRAVENOUS | Status: AC
  Administered 2015-03-27: 12:00:00 via INTRAVENOUS
  Filled 2015-03-27: qty 1000

## 2015-03-27 MED ORDER — PROCHLORPERAZINE MALEATE 10 MG PO TABS
10.0000 mg | ORAL_TABLET | Freq: Once | ORAL | Status: AC
  Administered 2015-03-27: 10 mg via ORAL
  Filled 2015-03-27: qty 1

## 2015-03-27 MED ORDER — GEMCITABINE HCL CHEMO INJECTION 1 GM/26.3ML
800.0000 mg/m2 | Freq: Once | INTRAVENOUS | Status: AC
  Administered 2015-03-27: 1710 mg via INTRAVENOUS
  Filled 2015-03-27: qty 39.71

## 2015-03-28 ENCOUNTER — Ambulatory Visit: Payer: Commercial Managed Care - HMO | Admitting: Gastroenterology

## 2015-03-28 ENCOUNTER — Other Ambulatory Visit: Payer: Commercial Managed Care - HMO

## 2015-03-28 ENCOUNTER — Ambulatory Visit
Admission: RE | Admit: 2015-03-28 | Discharge: 2015-03-28 | Disposition: A | Discharge status: Home / Self Care | Payer: Commercial Managed Care - HMO | Source: Ambulatory Visit | Attending: Radiation Oncology | Admitting: Radiation Oncology

## 2015-03-28 DIAGNOSIS — Z510 Encounter for antineoplastic radiation therapy: Secondary | ICD-10-CM | POA: Diagnosis not present

## 2015-03-29 ENCOUNTER — Inpatient Hospital Stay
Admission: EM | Admit: 2015-03-29 | Discharge: 2015-04-06 | DRG: 872 | Disposition: A | Discharge status: Skilled Nursing Facility | Payer: Commercial Managed Care - HMO | Attending: Internal Medicine | Admitting: Internal Medicine

## 2015-03-29 ENCOUNTER — Telehealth: Payer: Self-pay | Admitting: *Deleted

## 2015-03-29 ENCOUNTER — Emergency Department: Payer: Commercial Managed Care - HMO

## 2015-03-29 ENCOUNTER — Other Ambulatory Visit: Payer: Self-pay

## 2015-03-29 ENCOUNTER — Ambulatory Visit: Payer: Commercial Managed Care - HMO

## 2015-03-29 DIAGNOSIS — L89302 Pressure ulcer of unspecified buttock, stage 2: Secondary | ICD-10-CM | POA: Diagnosis present

## 2015-03-29 DIAGNOSIS — N400 Enlarged prostate without lower urinary tract symptoms: Secondary | ICD-10-CM | POA: Diagnosis present

## 2015-03-29 DIAGNOSIS — E1140 Type 2 diabetes mellitus with diabetic neuropathy, unspecified: Secondary | ICD-10-CM | POA: Diagnosis present

## 2015-03-29 DIAGNOSIS — J440 Chronic obstructive pulmonary disease with acute lower respiratory infection: Secondary | ICD-10-CM | POA: Diagnosis present

## 2015-03-29 DIAGNOSIS — E860 Dehydration: Secondary | ICD-10-CM | POA: Diagnosis present

## 2015-03-29 DIAGNOSIS — N183 Chronic kidney disease, stage 3 (moderate): Secondary | ICD-10-CM | POA: Diagnosis present

## 2015-03-29 DIAGNOSIS — L22 Diaper dermatitis: Secondary | ICD-10-CM | POA: Diagnosis present

## 2015-03-29 DIAGNOSIS — D630 Anemia in neoplastic disease: Secondary | ICD-10-CM | POA: Diagnosis present

## 2015-03-29 DIAGNOSIS — C259 Malignant neoplasm of pancreas, unspecified: Secondary | ICD-10-CM | POA: Diagnosis present

## 2015-03-29 DIAGNOSIS — N179 Acute kidney failure, unspecified: Secondary | ICD-10-CM | POA: Diagnosis present

## 2015-03-29 DIAGNOSIS — I2510 Atherosclerotic heart disease of native coronary artery without angina pectoris: Secondary | ICD-10-CM | POA: Diagnosis present

## 2015-03-29 DIAGNOSIS — I252 Old myocardial infarction: Secondary | ICD-10-CM | POA: Diagnosis not present

## 2015-03-29 DIAGNOSIS — Z9221 Personal history of antineoplastic chemotherapy: Secondary | ICD-10-CM

## 2015-03-29 DIAGNOSIS — E1122 Type 2 diabetes mellitus with diabetic chronic kidney disease: Secondary | ICD-10-CM | POA: Diagnosis present

## 2015-03-29 DIAGNOSIS — R0602 Shortness of breath: Secondary | ICD-10-CM | POA: Diagnosis not present

## 2015-03-29 DIAGNOSIS — Z800 Family history of malignant neoplasm of digestive organs: Secondary | ICD-10-CM

## 2015-03-29 DIAGNOSIS — R5383 Other fatigue: Secondary | ICD-10-CM | POA: Diagnosis not present

## 2015-03-29 DIAGNOSIS — R509 Fever, unspecified: Secondary | ICD-10-CM

## 2015-03-29 DIAGNOSIS — R062 Wheezing: Secondary | ICD-10-CM

## 2015-03-29 DIAGNOSIS — J45909 Unspecified asthma, uncomplicated: Secondary | ICD-10-CM | POA: Diagnosis present

## 2015-03-29 DIAGNOSIS — Z806 Family history of leukemia: Secondary | ICD-10-CM

## 2015-03-29 DIAGNOSIS — Z85828 Personal history of other malignant neoplasm of skin: Secondary | ICD-10-CM

## 2015-03-29 DIAGNOSIS — Z923 Personal history of irradiation: Secondary | ICD-10-CM | POA: Diagnosis not present

## 2015-03-29 DIAGNOSIS — M1990 Unspecified osteoarthritis, unspecified site: Secondary | ICD-10-CM | POA: Diagnosis present

## 2015-03-29 DIAGNOSIS — R109 Unspecified abdominal pain: Secondary | ICD-10-CM

## 2015-03-29 DIAGNOSIS — K5900 Constipation, unspecified: Secondary | ICD-10-CM | POA: Diagnosis present

## 2015-03-29 DIAGNOSIS — L8990 Pressure ulcer of unspecified site, unspecified stage: Secondary | ICD-10-CM | POA: Insufficient documentation

## 2015-03-29 DIAGNOSIS — Z951 Presence of aortocoronary bypass graft: Secondary | ICD-10-CM

## 2015-03-29 DIAGNOSIS — Z87891 Personal history of nicotine dependence: Secondary | ICD-10-CM

## 2015-03-29 DIAGNOSIS — T380X5A Adverse effect of glucocorticoids and synthetic analogues, initial encounter: Secondary | ICD-10-CM | POA: Diagnosis present

## 2015-03-29 DIAGNOSIS — N184 Chronic kidney disease, stage 4 (severe): Secondary | ICD-10-CM | POA: Diagnosis present

## 2015-03-29 DIAGNOSIS — A419 Sepsis, unspecified organism: Secondary | ICD-10-CM | POA: Diagnosis present

## 2015-03-29 DIAGNOSIS — H269 Unspecified cataract: Secondary | ICD-10-CM | POA: Diagnosis present

## 2015-03-29 DIAGNOSIS — K219 Gastro-esophageal reflux disease without esophagitis: Secondary | ICD-10-CM | POA: Diagnosis present

## 2015-03-29 DIAGNOSIS — G4730 Sleep apnea, unspecified: Secondary | ICD-10-CM | POA: Diagnosis present

## 2015-03-29 DIAGNOSIS — I509 Heart failure, unspecified: Secondary | ICD-10-CM | POA: Diagnosis present

## 2015-03-29 DIAGNOSIS — E785 Hyperlipidemia, unspecified: Secondary | ICD-10-CM | POA: Diagnosis present

## 2015-03-29 DIAGNOSIS — R609 Edema, unspecified: Secondary | ICD-10-CM

## 2015-03-29 LAB — CBC WITH DIFFERENTIAL/PLATELET
BASOS PCT: 0 %
Basophils Absolute: 0.1 10*3/uL (ref 0–0.1)
EOS ABS: 0 10*3/uL (ref 0–0.7)
Eosinophils Relative: 0 %
HCT: 32.5 % — ABNORMAL LOW (ref 40.0–52.0)
HEMOGLOBIN: 10.3 g/dL — AB (ref 13.0–18.0)
Lymphocytes Relative: 7 %
Lymphs Abs: 1.1 10*3/uL (ref 1.0–3.6)
MCH: 26.9 pg (ref 26.0–34.0)
MCHC: 31.7 g/dL — AB (ref 32.0–36.0)
MCV: 85 fL (ref 80.0–100.0)
Monocytes Absolute: 0.1 10*3/uL — ABNORMAL LOW (ref 0.2–1.0)
Monocytes Relative: 1 %
NEUTROS PCT: 92 %
Neutro Abs: 13.5 10*3/uL — ABNORMAL HIGH (ref 1.4–6.5)
Platelets: 187 10*3/uL (ref 150–440)
RBC: 3.82 MIL/uL — AB (ref 4.40–5.90)
RDW: 16 % — ABNORMAL HIGH (ref 11.5–14.5)
WBC: 14.8 10*3/uL — AB (ref 3.8–10.6)

## 2015-03-29 LAB — URINALYSIS COMPLETE WITH MICROSCOPIC (ARMC ONLY)
Bilirubin Urine: NEGATIVE
Glucose, UA: 50 mg/dL — AB
Hgb urine dipstick: NEGATIVE
KETONES UR: NEGATIVE mg/dL
Leukocytes, UA: NEGATIVE
NITRITE: NEGATIVE
PROTEIN: 30 mg/dL — AB
Specific Gravity, Urine: 1.017 (ref 1.005–1.030)
pH: 5 (ref 5.0–8.0)

## 2015-03-29 LAB — PHOSPHORUS: PHOSPHORUS: 2.7 mg/dL (ref 2.5–4.6)

## 2015-03-29 LAB — COMPREHENSIVE METABOLIC PANEL
ALT: 23 U/L (ref 17–63)
ANION GAP: 8 (ref 5–15)
AST: 35 U/L (ref 15–41)
Albumin: 2.8 g/dL — ABNORMAL LOW (ref 3.5–5.0)
Alkaline Phosphatase: 92 U/L (ref 38–126)
BILIRUBIN TOTAL: 1.2 mg/dL (ref 0.3–1.2)
BUN: 31 mg/dL — ABNORMAL HIGH (ref 6–20)
CO2: 22 mmol/L (ref 22–32)
Calcium: 8.3 mg/dL — ABNORMAL LOW (ref 8.9–10.3)
Chloride: 109 mmol/L (ref 101–111)
Creatinine, Ser: 1.69 mg/dL — ABNORMAL HIGH (ref 0.61–1.24)
GFR calc Af Amer: 44 mL/min — ABNORMAL LOW (ref >60)
GFR, EST NON AFRICAN AMERICAN: 38 mL/min — AB (ref >60)
Glucose, Bld: 206 mg/dL — ABNORMAL HIGH (ref 65–99)
POTASSIUM: 3.8 mmol/L (ref 3.5–5.1)
Sodium: 139 mmol/L (ref 135–145)
TOTAL PROTEIN: 6.4 g/dL — AB (ref 6.5–8.1)

## 2015-03-29 LAB — MAGNESIUM: MAGNESIUM: 1.2 mg/dL — AB (ref 1.7–2.4)

## 2015-03-29 LAB — PROTIME-INR
INR: 1.27
PROTHROMBIN TIME: 16 s — AB (ref 11.4–15.0)

## 2015-03-29 LAB — GLUCOSE, CAPILLARY
Glucose-Capillary: 277 mg/dL — ABNORMAL HIGH (ref 65–99)
Glucose-Capillary: 288 mg/dL — ABNORMAL HIGH (ref 65–99)

## 2015-03-29 LAB — TROPONIN I: TROPONIN I: 0.06 ng/mL — AB (ref <0.031)

## 2015-03-29 LAB — LACTIC ACID, PLASMA
LACTIC ACID, VENOUS: 1.6 mmol/L (ref 0.5–2.0)
Lactic Acid, Venous: 1.4 mmol/L (ref 0.5–2.0)

## 2015-03-29 LAB — APTT: APTT: 35 s (ref 24–36)

## 2015-03-29 LAB — LIPASE, BLOOD: LIPASE: 20 U/L (ref 11–51)

## 2015-03-29 LAB — URIC ACID: URIC ACID, SERUM: 7.3 mg/dL (ref 4.4–7.6)

## 2015-03-29 MED ORDER — TORSEMIDE 20 MG PO TABS
40.0000 mg | ORAL_TABLET | Freq: Two times a day (BID) | ORAL | Status: DC
  Administered 2015-03-29 – 2015-03-31 (×4): 40 mg via ORAL
  Filled 2015-03-29 (×4): qty 2

## 2015-03-29 MED ORDER — HEPARIN SODIUM (PORCINE) 5000 UNIT/ML IJ SOLN
5000.0000 [IU] | Freq: Three times a day (TID) | INTRAMUSCULAR | Status: DC
  Administered 2015-03-29 – 2015-04-06 (×24): 5000 [IU] via SUBCUTANEOUS
  Filled 2015-03-29 (×23): qty 1

## 2015-03-29 MED ORDER — SODIUM CHLORIDE 0.9 % IV BOLUS (SEPSIS)
1000.0000 mL | INTRAVENOUS | Status: AC
  Administered 2015-03-29 (×3): 1000 mL via INTRAVENOUS

## 2015-03-29 MED ORDER — IOHEXOL 300 MG/ML  SOLN
80.0000 mL | Freq: Once | INTRAMUSCULAR | Status: AC | PRN
  Administered 2015-03-29: 80 mL via INTRAVENOUS

## 2015-03-29 MED ORDER — HYDROCODONE-ACETAMINOPHEN 5-325 MG PO TABS
1.0000 | ORAL_TABLET | Freq: Four times a day (QID) | ORAL | Status: DC | PRN
  Administered 2015-03-29 – 2015-03-30 (×2): 1 via ORAL
  Filled 2015-03-29 (×2): qty 1

## 2015-03-29 MED ORDER — INSULIN GLARGINE 100 UNIT/ML ~~LOC~~ SOLN
40.0000 [IU] | Freq: Two times a day (BID) | SUBCUTANEOUS | Status: DC
  Administered 2015-03-29 – 2015-03-30 (×2): 40 [IU] via SUBCUTANEOUS
  Filled 2015-03-29 (×4): qty 0.4

## 2015-03-29 MED ORDER — PANTOPRAZOLE SODIUM 40 MG PO TBEC
40.0000 mg | DELAYED_RELEASE_TABLET | Freq: Every day | ORAL | Status: DC
  Administered 2015-03-30 – 2015-04-06 (×7): 40 mg via ORAL
  Filled 2015-03-29 (×7): qty 1

## 2015-03-29 MED ORDER — MELOXICAM 7.5 MG PO TABS
7.5000 mg | ORAL_TABLET | Freq: Every day | ORAL | Status: DC
  Administered 2015-03-29 – 2015-04-06 (×9): 7.5 mg via ORAL
  Filled 2015-03-29 (×9): qty 1

## 2015-03-29 MED ORDER — PIPERACILLIN-TAZOBACTAM 3.375 G IVPB 30 MIN
3.3750 g | Freq: Once | INTRAVENOUS | Status: AC
  Administered 2015-03-29: 3.375 g via INTRAVENOUS
  Filled 2015-03-29: qty 50

## 2015-03-29 MED ORDER — POLYETHYLENE GLYCOL 3350 17 GM/SCOOP PO POWD
17.0000 g | Freq: Every day | ORAL | Status: DC | PRN
  Administered 2015-04-02: 05:00:00 17 g via ORAL
  Filled 2015-03-29: qty 255

## 2015-03-29 MED ORDER — VANCOMYCIN HCL 10 G IV SOLR
1500.0000 mg | Freq: Once | INTRAVENOUS | Status: AC
  Administered 2015-03-29: 1500 mg via INTRAVENOUS
  Filled 2015-03-29: qty 1500

## 2015-03-29 MED ORDER — ONDANSETRON HCL 4 MG PO TABS: 4.0000 mg | ORAL_TABLET | Freq: Three times a day (TID) | ORAL | Status: DC | PRN

## 2015-03-29 MED ORDER — PRAVASTATIN SODIUM 20 MG PO TABS
20.0000 mg | ORAL_TABLET | Freq: Every day | ORAL | Status: DC
  Administered 2015-03-29 – 2015-04-05 (×8): 20 mg via ORAL
  Filled 2015-03-29 (×8): qty 1

## 2015-03-29 MED ORDER — INSULIN ASPART 100 UNIT/ML ~~LOC~~ SOLN
0.0000 [IU] | Freq: Three times a day (TID) | SUBCUTANEOUS | Status: DC
  Administered 2015-03-29: 18:00:00 5 [IU] via SUBCUTANEOUS
  Filled 2015-03-29: qty 5

## 2015-03-29 MED ORDER — KETOROLAC TROMETHAMINE 30 MG/ML IJ SOLN
INTRAMUSCULAR | Status: AC
  Administered 2015-03-29: 30 mg via INTRAVENOUS
  Filled 2015-03-29: qty 1

## 2015-03-29 MED ORDER — VANCOMYCIN HCL IN DEXTROSE 1-5 GM/200ML-% IV SOLN
1000.0000 mg | INTRAVENOUS | Status: DC
  Administered 2015-03-29: 1000 mg via INTRAVENOUS
  Filled 2015-03-29 (×2): qty 200

## 2015-03-29 MED ORDER — MAGNESIUM SULFATE 2 GM/50ML IV SOLN
2.0000 g | Freq: Once | INTRAVENOUS | Status: AC
  Administered 2015-03-29: 2 g via INTRAVENOUS
  Filled 2015-03-29: qty 50

## 2015-03-29 MED ORDER — ACETAMINOPHEN 325 MG PO TABS
650.0000 mg | ORAL_TABLET | Freq: Four times a day (QID) | ORAL | Status: DC | PRN
  Administered 2015-03-29 – 2015-03-31 (×5): 650 mg via ORAL
  Filled 2015-03-29 (×6): qty 2

## 2015-03-29 MED ORDER — GABAPENTIN 300 MG PO CAPS
300.0000 mg | ORAL_CAPSULE | Freq: Two times a day (BID) | ORAL | Status: DC
  Administered 2015-03-29 – 2015-04-06 (×15): 300 mg via ORAL
  Filled 2015-03-29 (×15): qty 1

## 2015-03-29 MED ORDER — KETOROLAC TROMETHAMINE 30 MG/ML IJ SOLN
30.0000 mg | Freq: Once | INTRAMUSCULAR | Status: AC
  Administered 2015-03-29: 30 mg via INTRAVENOUS

## 2015-03-29 MED ORDER — IPRATROPIUM-ALBUTEROL 0.5-2.5 (3) MG/3ML IN SOLN
3.0000 mL | Freq: Once | RESPIRATORY_TRACT | Status: AC
  Administered 2015-03-29: 3 mL via RESPIRATORY_TRACT
  Filled 2015-03-29: qty 3

## 2015-03-29 MED ORDER — IOHEXOL 240 MG/ML SOLN
25.0000 mL | Freq: Once | INTRAMUSCULAR | Status: AC | PRN
  Administered 2015-03-29: 25 mL via ORAL

## 2015-03-29 MED ORDER — INSULIN ASPART 100 UNIT/ML ~~LOC~~ SOLN: 0.0000 [IU] | Freq: Three times a day (TID) | SUBCUTANEOUS | Status: DC

## 2015-03-29 MED ORDER — PIPERACILLIN-TAZOBACTAM 3.375 G IVPB
3.3750 g | Freq: Three times a day (TID) | INTRAVENOUS | Status: DC
  Administered 2015-03-29 – 2015-04-03 (×15): 3.375 g via INTRAVENOUS
  Filled 2015-03-29 (×17): qty 50

## 2015-03-29 MED ORDER — ASPIRIN EC 81 MG PO TBEC
81.0000 mg | DELAYED_RELEASE_TABLET | Freq: Every day | ORAL | Status: DC
  Administered 2015-03-29 – 2015-04-06 (×9): 81 mg via ORAL
  Filled 2015-03-29 (×9): qty 1

## 2015-03-29 MED ORDER — INSULIN ASPART 100 UNIT/ML ~~LOC~~ SOLN
0.0000 [IU] | Freq: Three times a day (TID) | SUBCUTANEOUS | Status: DC
  Administered 2015-03-29 – 2015-03-30 (×2): 5 [IU] via SUBCUTANEOUS
  Administered 2015-03-30: 7 [IU] via SUBCUTANEOUS
  Administered 2015-03-30 (×2): 5 [IU] via SUBCUTANEOUS
  Administered 2015-03-31: 9 [IU] via SUBCUTANEOUS
  Administered 2015-03-31: 7 [IU] via SUBCUTANEOUS
  Administered 2015-03-31: 08:00:00 5 [IU] via SUBCUTANEOUS
  Administered 2015-04-01: 3 [IU] via SUBCUTANEOUS
  Administered 2015-04-01: 5 [IU] via SUBCUTANEOUS
  Administered 2015-04-01: 7 [IU] via SUBCUTANEOUS
  Administered 2015-04-02 (×3): 9 [IU] via SUBCUTANEOUS
  Filled 2015-03-29 (×2): qty 5
  Filled 2015-03-29 (×2): qty 9
  Filled 2015-03-29: qty 7
  Filled 2015-03-29: qty 5
  Filled 2015-03-29: qty 7
  Filled 2015-03-29: qty 5
  Filled 2015-03-29: qty 9
  Filled 2015-03-29: qty 7
  Filled 2015-03-29: qty 3
  Filled 2015-03-29 (×2): qty 5

## 2015-03-29 NOTE — ED Notes (Signed)
Pt arrives via [facility] c/o fever. Pt pancreatic CA patient, chemo on Monday, radiation yesterday. Pt c/o body aches all over as well. Pt arrives on 2L [place] RA saturation 92% per EMS.  Pt rectal temperature of 103.34F, given 1G of Tylenol PO 15 min PTA. Pt warm to touch. Pt alert and oriented X4, active, cooperative, pt in NAD. RR even and unlabored, color WNL.

## 2015-03-29 NOTE — ED Notes (Signed)
MD at bedside. 

## 2015-03-29 NOTE — H&P (Signed)
[facility] [place] NAME: [name]    MR#:  [id number]  DATE OF BIRTH:  [birth year]  DATE OF ADMISSION:  03/29/2015  PRIMARY CARE PHYSICIAN: Rusty Aus., MD   REQUESTING/REFERRING PHYSICIAN: stafford  CHIEF COMPLAINT:   Chief Complaint  Patient presents with  . Code Sepsis    HISTORY OF PRESENT ILLNESS: [name]  is a 74 y.o. male with a known history of adenocarcinoma of pancreas, benign prostatic hypertrophy, coronary artery disease, hyperlipidemia, diabetes, asthma, recent admission one month ago with Klebsiella pneumoniae bacteremia and was treated for 7 days IV antibiotic in hospital and discharged with oral 10 days course of Keflex. He is recently started on chemotherapy last week and he had first radiation yesterday from [facility], last night patient was very weak and he could not get off from his bed. Usually he is strong and able to walk without any support. Concerned with his weakness he decided to come to emergency room today, he was noted to have fever of 10 26F and his white cell count was elevated, with tachycardia. So he was given broad-spectrum antibiotic and given to hospitalist team for sepsis. Initial workup in ER was reported to be negative for any clear source of infection. The patient denies any urinary symptoms, or diarrhea he complains of having some cough with minimal sputum production.  PAST MEDICAL HISTORY:   Past Medical History  Diagnosis Date  . Hyperlipidemia   . H/O cardiac catheterization 06/03/06,12/03/11  . BPH (benign prostatic hypertrophy)   . Anxiety   . Syncope     LAUGHING INDUCED  . Myocardial infarction ([place])     x 2  . CHF (congestive heart failure) ([place])   . CAD (coronary artery disease) 06/03/06    cypher 2.5 x 103mm DES for 80% mid RCA sees Dr. Ubaldo Glassing  . Asthma     as a child  . Sleep apnea     hx of  . Kidney stone     hx of  . GERD (gastroesophageal reflux  disease)     on medication for  . Cancer ([place])     hx of skin ca in "corner of left eye"  . Arthritis   . Anemia     taking iron  . Cataract, bilateral   . Neuromuscular disorder ([place])     diabetic neuropathy  . Diabetes mellitus without complication ([place])   . Pancreatic adenocarcinoma ([place])     PAST SURGICAL HISTORY:  Past Surgical History  Procedure Laterality Date  . Spine surgery  9/05    CERVICAL LAMINECTOMY  . Cervical herniated    . Eye surgery      as a child  . Coronary artery bypass graft  12/12/2011    Procedure: CORONARY ARTERY BYPASS GRAFTING (CABG);  Surgeon: Gaye Pollack, MD;  Location: [facility];  Service: Open Heart Surgery;  Laterality: N/A;  coronary artery bypass graft times four using left internal mammary artery, right leg greater saphenous vein harvested endoscopically  . Cardiac catheterization N/A 10/26/2014    Procedure: Left Heart Cath;  Surgeon: Isaias Cowman, MD;  Location: [facility];  Service: Cardiovascular;  Laterality: N/A;  . Endoscopic retrograde cholangiopancreatography (ercp) with propofol N/A 02/13/2015    Procedure: ENDOSCOPIC RETROGRADE CHOLANGIOPANCREATOGRAPHY (ERCP) WITH PROPOFOL;  Surgeon: Lucilla Lame, MD;  Location: [facility] ENDOSCOPY;  Service: Endoscopy;  Laterality: N/A;  . Eus N/A 02/23/2015    Procedure: UPPER ENDOSCOPIC ULTRASOUND (  EUS) LINEAR;  Surgeon: Holly Bodily, MD;  Location[facility] ENDOSCOPY;  Service: Gastroenterology;  Laterality: N/A;    SOCIAL HISTORY:  Social History  Substance Use Topics  . Smoking status: Former Smoker    Types: Cigarettes, Cigars    Quit date: 12/03/1977  . Smokeless tobacco: Never Used  . Alcohol Use: No    FAMILY HISTORY:  Family History  Problem Relation Age of Onset  . Diabetes Mellitus II Mother     Also Brother and sister  . Pancreatic cancer Mother   . Leukemia Father   . Diabetes Mellitus II Sister   . Diabetes Mellitus II Brother     DRUG ALLERGIES:  Allergies   Allergen Reactions  . Zithromax [Azithromycin] Anaphylaxis and Other (See Comments)    Reaction:  Stroke-like symptoms   . Lisinopril Other (See Comments)    Per spouse, pt did not tolerate well in the past.      REVIEW OF SYSTEMS:   CONSTITUTIONAL: Positive for fever, fatigue and generalized weakness.  EYES: No blurred or double vision.  EARS, NOSE, AND THROAT: No tinnitus or ear pain.  RESPIRATORY: Positive for cough, no shortness of breath, no wheezing or hemoptysis.  CARDIOVASCULAR: No chest pain, orthopnea, edema.  GASTROINTESTINAL: No nausea, vomiting, diarrhea or abdominal pain.  GENITOURINARY: No dysuria, hematuria.  ENDOCRINE: No polyuria, nocturia,  HEMATOLOGY: No anemia, easy bruising or bleeding SKIN: No rash or lesion. MUSCULOSKELETAL: No joint pain or arthritis.   NEUROLOGIC: No tingling, numbness, weakness.  PSYCHIATRY: No anxiety or depression.   MEDICATIONS AT HOME:  Prior to Admission medications   Medication Sig Start Date End Date Taking? Authorizing Provider  acetaminophen (TYLENOL) 650 MG CR tablet Take 1,300 mg by mouth every 8 (eight) hours as needed for pain.   Yes Historical Provider, MD  aspirin EC 81 MG tablet Take 81 mg by mouth daily.   Yes Historical Provider, MD  gabapentin (NEURONTIN) 300 MG capsule Take 300 mg by mouth 2 (two) times daily.   Yes Historical Provider, MD  HYDROcodone-acetaminophen (NORCO/VICODIN) 5-325 MG tablet Take 1 tablet by mouth every 6 (six) hours as needed. For pain. 02/24/15  Yes Historical Provider, MD  insulin glargine (LANTUS) 100 UNIT/ML injection Inject 60 Units into the skin 2 (two) times daily.    Yes Historical Provider, MD  insulin regular (NOVOLIN R,HUMULIN R) 100 units/mL injection Inject 5-15 Units into the skin 3 (three) times daily before meals. Pt uses as needed per sliding scale.   Yes Historical Provider, MD  lovastatin (MEVACOR) 20 MG tablet Take 20 mg by mouth at bedtime.   Yes Historical Provider, MD   meloxicam (MOBIC) 7.5 MG tablet Take 1 tablet by mouth daily. 03/01/15  Yes Historical Provider, MD  omeprazole (PRILOSEC) 20 MG capsule Take 20 mg by mouth 2 (two) times daily.   Yes Historical Provider, MD  ondansetron (ZOFRAN) 4 MG tablet Take 4 mg by mouth every 8 (eight) hours as needed for nausea or vomiting.   Yes Historical Provider, MD  polyethylene glycol powder (GLYCOLAX/MIRALAX) powder Take 17 g by mouth daily as needed. Constipation. 01/10/15  Yes Historical Provider, MD  torsemide (DEMADEX) 20 MG tablet Take 40 mg by mouth 2 (two) times daily.   Yes Historical Provider, MD  cephALEXin (KEFLEX) 500 MG capsule Take 1 capsule (500 mg total) by mouth 3 (three) times daily. 03/01/15   Demetrios Loll, MD      PHYSICAL EXAMINATION:   VITAL SIGNS: Blood pressure 146/71,  pulse 104, temperature 100.1 F (37.8 C), temperature source Oral, resp. rate 21, height 5\' 8"  (1.727 m), weight 94.2 kg (207 lb 10.8 oz), SpO2 96 %.  GENERAL:  74 y.o.-year-old patient lying in the bed with no acute distress.  EYES: Pupils equal, round, reactive to light and accommodation. No scleral icterus. Extraocular muscles intact.  HEENT: Head atraumatic, normocephalic. Oropharynx and nasopharynx clear.  NECK:  Supple, no jugular venous distention. No thyroid enlargement, no tenderness.  LUNGS: Normal breath sounds bilaterally, no wheezing, mild crepitation. No use of accessory muscles of respiration.  CARDIOVASCULAR: S1, S2 normal. No murmurs, rubs, or gallops.  ABDOMEN: Soft, nontender, nondistended. Bowel sounds present. No organomegaly or mass.  EXTREMITIES: No pedal edema, cyanosis, or clubbing.  NEUROLOGIC: Cranial nerves II through XII are intact. Muscle strength 4/5 in upper extremities but 3/5 in lower extremity. Sensation intact. Gait not checked.  PSYCHIATRIC: The patient is alert and oriented x 3.  SKIN: No obvious rash, lesion, or ulcer.   LABORATORY PANEL:   CBC  Recent Labs Lab 03/23/15 0837  03/29/15 0955  WBC 6.7 14.8*  HGB 10.7* 10.3*  HCT 33.4* 32.5*  PLT 156 187  MCV 85.9 85.0  MCH 27.5 26.9  MCHC 32.0 31.7*  RDW 16.7* 16.0*  LYMPHSABS 1.2 1.1  MONOABS 0.4 0.1*  EOSABS 0.2 0.0  BASOSABS 0.0 0.1   ------------------------------------------------------------------------------------------------------------------  Chemistries   Recent Labs Lab 03/23/15 0837 03/29/15 0955  NA 137 139  K 4.0 3.8  CL 105 109  CO2 25 22  GLUCOSE 288* 206*  BUN 22* 31*  CREATININE 1.55* 1.69*  CALCIUM 8.3* 8.3*  MG  --  1.2*  AST 31 35  ALT 23 23  ALKPHOS 133* 92  BILITOT 1.1 1.2   ------------------------------------------------------------------------------------------------------------------ estimated creatinine clearance is 42.7 mL/min (by C-G formula based on Cr of 1.69). ------------------------------------------------------------------------------------------------------------------ No results for input(s): TSH, T4TOTAL, T3FREE, THYROIDAB in the last 72 hours.  Invalid input(s): FREET3   Coagulation profile  Recent Labs Lab 03/29/15 0955  INR 1.27   ------------------------------------------------------------------------------------------------------------------- No results for input(s): DDIMER in the last 72 hours. -------------------------------------------------------------------------------------------------------------------  Cardiac Enzymes  Recent Labs Lab 03/29/15 0955  TROPONINI 0.06*   ------------------------------------------------------------------------------------------------------------------ Invalid input(s): POCBNP  ---------------------------------------------------------------------------------------------------------------  Urinalysis    Component Value Date/Time   COLORURINE YELLOW* 03/29/2015 0955   APPEARANCEUR CLEAR* 03/29/2015 0955   LABSPEC 1.017 03/29/2015 0955   PHURINE 5.0 03/29/2015 0955   GLUCOSEU 50* 03/29/2015  0955   HGBUR NEGATIVE 03/29/2015 0955   BILIRUBINUR NEGATIVE 03/29/2015 0955   KETONESUR NEGATIVE 03/29/2015 0955   PROTEINUR 30* 03/29/2015 0955   UROBILINOGEN 0.2 12/10/2011 1338   NITRITE NEGATIVE 03/29/2015 0955   LEUKOCYTESUR NEGATIVE 03/29/2015 0955     RADIOLOGY: Ct Abdomen Pelvis W Contrast  03/29/2015  CLINICAL DATA:  74 year old male diagnosed with pancreatic moderately differentiated adenocarcinoma last month via endoscopic ultrasound biopsy. status post chemotherapy, radiation, stent placement. Abdominal pain and distension with fever for 2 days. Subsequent encounter. EXAM: CT ABDOMEN AND PELVIS WITH CONTRAST TECHNIQUE: Multidetector CT imaging of the abdomen and pelvis was performed using the standard protocol following bolus administration of intravenous contrast. CONTRAST:  36mL OMNIPAQUE IOHEXOL 300 MG/ML  SOLN COMPARISON:  CT Abdomen and Pelvis 02/14/2015 and earlier. FINDINGS: Stable cardiomegaly. No pericardial effusion. Small layering left pleural effusion is stable. Atelectasis at the lung bases. Previous sternotomy. No acute or metastatic osseous lesion identified. Stable small fat containing left inguinal hernia. No pelvic free fluid. Negative urinary bladder. Retained  stool throughout the distal colon. Redundant sigmoid with some gaseous distension. Retained stool throughout the left colon, and more proximal colon. Redundant right colon. Normal appendix. Negative terminal ileum. No dilated small bowel. Small sliding-type gastric hiatal hernia. Otherwise negative stomach. CBD stent in place. Mild gaseous distension of the duodenum which otherwise appears normal. Pneumobilia has not significantly changed. Pancreatic ductal dilatation has not significantly changed. Hypodense lesions at the pancreatic head and neck appears stable (series 2, images 31 and 32). Stable peripancreatic lymph node measuring 11 mm short axis situated between the IVC and main portal vein. Other porta hepatis  nodes are stable measuring up to 12 mm short axis. Liver enhancement and gallbladder are within normal limits (small volume of pneumobilia related gas in the gallbladder). Spleen and adrenal glands within normal limits. Portal venous system is patent. Aortoiliac calcified atherosclerosis noted. Major arterial structures are patent. No other abdominal or pelvic lymphadenopathy bilateral initial phase renal enhancement is stable and within normal limits. On delayed images there is decreased left renal contrast excretion. The right kidney appears stable. No abdominal free fluid. IMPRESSION: 1. Largely stable CT appearance of the abdomen since October. CBD stent in place. Hypodense pancreatic head and neck lesions with surrounding 11-12 mm short axis lymph nodes compatible with pancreatic adenocarcinoma. No distant metastatic disease identified in the abdomen or pelvis. 2. Lack of left renal contrast excretion on delayed images today suggesting a degree of left renal insufficiency. Electronically Signed   By: Genevie Ann M.D.   On: 03/29/2015 12:27   Dg Chest Port 1 View  03/29/2015  CLINICAL DATA:  Fever.  Pancreatic cancer. EXAM: PORTABLE CHEST 1 VIEW COMPARISON:  Chest radiograph 02/25/2015.  CT chest 02/25/2015. FINDINGS: The heart is enlarged. 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 Thoracic atherosclerosis. Low lung volumes. No definite active infiltrates or failure. No osseous findings. IMPRESSION: Low lung volumes. No definite active infiltrates or failure. Cardiomegaly. Electronically Signed   By: Staci Righter M.D.   On: 03/29/2015 10:39    EKG: Orders placed or performed during the hospital encounter of 03/29/15  . EKG 12-Lead  . EKG 12-Lead    IMPRESSION AND PLAN:  * Sepsis Evident by fever, tachycardia, elevated white cell count, generalized weakness.  Urinalysis, chest x-ray, CT abdomen are negative for any clear source of infection.  Patient recently had bacteremia 1 month ago and treated with antibiotics.  He  denies any diarrhea.  I will give broad-spectrum antibiotic for now, blood and urine culture are sent by ER.  Call oncology and infectious disease consult.  * Adenocarcinoma of the pancreas  Follows with cancer Center, recently started on chemotherapy and radiation.  I will call oncology consult.  * Diabetes He is on Lantus 60 units twice a day at home.  I will decrease it to 40 units twice a day and keep him on sliding scale coverage as he will be on modified diet.  * History of coronary artery disease  On statin and aspirin, continue that.  All the records are reviewed and case discussed with ED provider. Management plans discussed with the patient, family and they are in agreement.  CODE STATUS: Full code Advance Directive Documentation        Most Recent Value   Type of Advance Directive  Healthcare Power of Attorney, Living will   Pre-existing out of facility DNR order (yellow form or pink MOST form)     "MOST" Form in Place?         TOTAL  TIME TAKING CARE OF THIS PATIENT: 50 minutes.   Healthcare power of attorney is patient's wife. Vaughan Basta M.D on 03/29/2015   Between 7am to 6pm - Pager - [phone]  After 6pm go to [email or web address] - password [email or web address] [facility]  Office  [phone]  CC: Primary care physician; Rusty Aus., MD   Note: This dictation was prepared with Dragon dictation along with smaller phrase technology. Any transcriptional errors that result from this process are unintentional.

## 2015-03-29 NOTE — Consult Note (Signed)
ANTIBIOTIC CONSULT NOTE - INITIAL  Pharmacy Consult for Vancomycin/Zosyn Indication: rule out sepsis  Allergies  Allergen Reactions  . Zithromax [Azithromycin] Anaphylaxis and Other (See Comments)    Reaction:  Stroke-like symptoms   . Lisinopril Other (See Comments)    Per spouse, pt did not tolerate well in the past.      Patient Measurements: Height: 5\' 8"  (172.7 cm) Weight: 207 lb 10.8 oz (94.2 kg) IBW/kg (Calculated) : 68.4 Adjusted Body Weight: 78.7 kg  Vital Signs: Temp: 100.1 F (37.8 C) (12/07 1120) Temp Source: Oral (12/07 1120) BP: 150/68 mmHg (12/07 1236) Pulse Rate: 105 (12/07 1236) Intake/Output from previous day:   Intake/Output from this shift:    Labs:  Recent Labs  03/29/15 0955  WBC 14.8*  HGB 10.3*  PLT 187  CREATININE 1.69*   Estimated Creatinine Clearance: 42.7 mL/min (by C-G formula based on Cr of 1.69). Ke: 0.04, Vd: 55.1 L, t-1/2: 17.33 hrs.   Microbiology: No results found for this or any previous visit (from the past 720 hour(s)).  Medical History: Past Medical History  Diagnosis Date  . Hyperlipidemia   . H/O cardiac catheterization 06/03/06,12/03/11  . BPH (benign prostatic hypertrophy)   . Anxiety   . Syncope     LAUGHING INDUCED  . Myocardial infarction ([place])     x 2  . CHF (congestive heart failure) ([place])   . CAD (coronary artery disease) 06/03/06    cypher 2.5 x 3mm DES for 80% mid RCA sees Dr. Ubaldo Glassing  . Asthma     as a child  . Sleep apnea     hx of  . Kidney stone     hx of  . GERD (gastroesophageal reflux disease)     on medication for  . 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 Arthritis   . Anemia     taking iron  . Cataract, bilateral   . Neuromuscular disorder ([place])     diabetic neuropathy  . Diabetes mellitus without complication ([place])   . Pancreatic adenocarcinoma ([facility])     Medications:  Scheduled:   Infusions:  . sodium chloride 1,000 mL (03/29/15 1244)   Assessment: 74 yo  male ordered Vancomycin and Zosyn for sepsis.  Goal of Therapy:  Vancomycin trough level 15-20 mcg/ml  Plan:  Vancomycin 1500mg  IV given in ED at 10:00.   Ordered Vancomycin 1g IV Q18H to begin tonight at 22:00 (stacked dosing).   Will order trough level prior to 4th dose on 12/9 at 09:30.  Zosyn 3.375g EI given in ED at 12:44.  Will continue dosing with Zosyn 3.375g EI Q8H beginning tonight at 19:00.  Follow up culture results  Lewellyn Fultz K, RPh 03/29/2015,1:16 PM

## 2015-03-29 NOTE — ED Notes (Signed)
Pt up and eating in the bed at this time.

## 2015-03-29 NOTE — ED Provider Notes (Signed)
[facility] Emergency Department Provider Note  ____________________________________________  Time seen: 9:40 AM on arrival by EMS  I have reviewed the triage vital signs and the nursing notes.   HISTORY  Chief Complaint Code Sepsis    HPI [name] is a 74 y.o. male with pancreatic cancer who is brought to the ED due to fever and generalized weakness. He last had chemotherapy 2 days ago and radiation therapy yesterday. He complains of generalized body aches. Denies chest pain shortness of breath or coughing. Febrile and tachycardic on arrival. Alert and oriented. Profoundly fatigued. Denies any vomiting or diarrhea.     Past Medical History  Diagnosis Date  . Hyperlipidemia   . H/O cardiac catheterization 06/03/06,12/03/11  . BPH (benign prostatic hypertrophy)   . Anxiety   . Syncope     LAUGHING INDUCED  . Myocardial infarction ([place])     x 2  . CHF (congestive heart failure) ([place])   . CAD (coronary artery disease) 06/03/06    cypher 2.5 x 69mm DES for 80% mid RCA sees Dr. Ubaldo Glassing  . Asthma     as a child  . Sleep apnea     hx of  . Kidney stone     hx of  . GERD (gastroesophageal reflux disease)     on medication for  . Cancer ([place])     hx of skin ca in "corner of left eye"  . Arthritis   . Anemia     taking iron  . Cataract, bilateral   . Neuromuscular disorder ([place])     diabetic neuropathy  . Diabetes mellitus without complication ([place])   . Pancreatic adenocarcinoma [facility])      Patient Active Problem List   Diagnosis Date Noted  . Sepsis ([place]) 02/25/2015  . Pancreatic cancer ([place]) 02/22/2015  . Pancreas neoplasm   . Acquired hyperbilirubinemia   . Disease of biliary tract   . Obstructive hyperbilirubinemia   . Jaundice   . Obstructive jaundice 02/10/2015  . Angina at rest [facility]) 10/26/2014  . 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 Diabetes mellitus   . Hyperlipidemia   . CAD (coronary artery disease)   . Sleep apnea   . Asthma    . BPH (benign prostatic hypertrophy)   . Anxiety   . Syncope      Past Surgical History  Procedure Laterality Date  . Spine surgery  9/05    CERVICAL LAMINECTOMY  . Cervical herniated    . Eye surgery      as a child  . Coronary artery bypass graft  12/12/2011    Procedure: CORONARY ARTERY BYPASS GRAFTING (CABG);  Surgeon: Gaye Pollack, MD;  Location: [place];  Service: Open Heart Surgery;  Laterality: N/A;  coronary artery bypass graft times four using left internal mammary artery, right leg greater saphenous vein harvested endoscopically  . Cardiac catheterization N/A 10/26/2014    Procedure: Left Heart Cath;  Surgeon: Isaias Cowman, MD;  Location: [place] CV LAB;  Service: Cardiovascular;  Laterality: N/A;  . Endoscopic retrograde cholangiopancreatography (ercp) with propofol N/A 02/13/2015    Procedure: ENDOSCOPIC RETROGRADE CHOLANGIOPANCREATOGRAPHY (ERCP) WITH PROPOFOL;  Surgeon: Lucilla Lame, MD;  Location: [facility] ENDOSCOPY;  Service: Endoscopy;  Laterality: N/A;  . Eus N/A 02/23/2015    Procedure: UPPER ENDOSCOPIC ULTRASOUND (EUS) LINEAR;  Surgeon: Holly Bodily, MD;  Location: [facility] ENDOSCOPY;  Service: Gastroenterology;  Laterality: N/A;     Current Outpatient Rx  Name  Route  Sig  Dispense  Refill  . acetaminophen (TYLENOL) 650 MG CR tablet   Oral   Take 1,300 mg by mouth every 8 (eight) hours as needed for pain.         Marland Kitchen aspirin EC 81 MG tablet   Oral   Take 81 mg by mouth daily.         Marland Kitchen gabapentin (NEURONTIN) 300 MG capsule   Oral   Take 300 mg by mouth 2 (two) times daily.         Marland Kitchen HYDROcodone-acetaminophen (NORCO/VICODIN) 5-325 MG tablet   Oral   Take 1 tablet by mouth every 6 (six) hours as needed. For pain.         Marland Kitchen insulin glargine (LANTUS) 100 UNIT/ML injection   Subcutaneous   Inject 60 Units into the skin 2 (two) times daily.          . insulin regular (NOVOLIN R,HUMULIN R) 100 units/mL injection   Subcutaneous   Inject  5-15 Units into the skin 3 (three) times daily before meals. Pt uses as needed per sliding scale.         . lovastatin (MEVACOR) 20 MG tablet   Oral   Take 20 mg by mouth at bedtime.         . meloxicam (MOBIC) 7.5 MG tablet   Oral   Take 1 tablet by mouth daily.         Marland Kitchen omeprazole (PRILOSEC) 20 MG capsule   Oral   Take 20 mg by mouth 2 (two) times daily.         . ondansetron (ZOFRAN) 4 MG tablet   Oral   Take 4 mg by mouth every 8 (eight) hours as needed for nausea or vomiting.         . polyethylene glycol powder (GLYCOLAX/MIRALAX) powder   Oral   Take 17 g by mouth daily as needed. Constipation.         . torsemide (DEMADEX) 20 MG tablet   Oral   Take 40 mg by mouth 2 (two) times daily.         . cephALEXin (KEFLEX) 500 MG capsule   Oral   Take 1 capsule (500 mg total) by mouth 3 (three) times daily.   30 capsule   0      Allergies Zithromax and Lisinopril   Family History  Problem Relation Age of Onset  . Diabetes Mellitus II Mother     Also Brother and sister  . Pancreatic cancer Mother   . Leukemia Father   . Diabetes Mellitus II Sister   . Diabetes Mellitus II Brother     Social History Social History  Substance Use Topics  . Smoking status: Former Smoker    Types: Cigarettes, Cigars    Quit date: 12/03/1977  . Smokeless tobacco: Never Used  . Alcohol Use: No    Review of Systems  Constitutional:   Positive fever. No weight changes Eyes:   No blurry vision or double vision.  ENT:   No sore throat. Cardiovascular:   No chest pain. Respiratory:   No dyspnea or cough. Gastrointestinal:   Negative for abdominal pain, vomiting and diarrhea.  No BRBPR or melena. Genitourinary:   Negative for dysuria, urinary retention, bloody urine, or difficulty urinating. Musculoskeletal:   Negative for back pain. No joint swelling or pain. Skin:   Negative for rash. Neurological:   Negative for headaches, focal weakness or  numbness. Psychiatric:  No anxiety or  depression.   Endocrine:  No hot/cold intolerance, changes in energy, or sleep difficulty.  10-point ROS otherwise negative.  ____________________________________________   PHYSICAL EXAM:  VITAL SIGNS: ED Triage Vitals  Enc Vitals Group     BP 03/29/15 0948 147/72 mmHg     Pulse Rate 03/29/15 0948 109     Resp 03/29/15 0948 20     Temp 03/29/15 0948 102.6 F (39.2 C)     Temp Source 03/29/15 0948 Oral     SpO2 03/29/15 0948 98 %     Weight 03/29/15 0948 203 lb (92.08 kg)     Height 03/29/15 0948 5\' 8"  (1.727 m)     Head Cir --      Peak Flow --      Pain Score 03/29/15 0948 8     Pain Loc --      Pain Edu? --      Excl. in [place]? --      Constitutional:   Alert and oriented. Ill-appearing Eyes:   No scleral icterus. No conjunctival pallor. PERRL. EOMI ENT   Head:   Normocephalic and atraumatic.   Nose:   No congestion/rhinnorhea. No septal hematoma   Mouth/Throat:   Dry mucous membranes, no pharyngeal erythema. No peritonsillar mass. No uvula shift.   Neck:   No stridor. No SubQ emphysema. No meningismus. Hematological/Lymphatic/Immunilogical:   No cervical lymphadenopathy. Cardiovascular:   Tachycardia heart rate 110. Normal and symmetric distal pulses are present in all extremities. No murmurs, rubs, or gallops. Respiratory:   Normal respiratory effort without tachypnea nor retractions. Breath sounds are clear and equal bilaterally. Inducible expiratory wheeze with forced expiration  Gastrointestinal:   Soft with generalized tenderness, mild distention. There is no CVA tenderness.  No rebound, rigidity, or guarding. Genitourinary:   deferred Musculoskeletal:   Nontender with normal range of motion in all extremities. No joint effusions.  No lower extremity tenderness.  No edema. Neurologic:   Normal speech and language.  CN 2-10 normal. Motor grossly intact. No pronator drift.  Normal gait. No gross focal neurologic  deficits are appreciated.  Skin:    Skin is warm, dry and intact. No rash noted.  No petechiae, purpura, or bullae. Psychiatric:   Mood and affect are normal. Speech and behavior are normal. Patient exhibits appropriate insight and judgment.  ____________________________________________    LABS (pertinent positives/negatives) (all labs ordered are listed, but only abnormal results are displayed) Labs Reviewed  COMPREHENSIVE METABOLIC PANEL - Abnormal; Notable for the following:    Glucose, Bld 206 (*)    BUN 31 (*)    Creatinine, Ser 1.69 (*)    Calcium 8.3 (*)    Total Protein 6.4 (*)    Albumin 2.8 (*)    GFR calc non Af Amer 38 (*)    GFR calc Af Amer 44 (*)    All other components within normal limits  TROPONIN I - Abnormal; Notable for the following:    Troponin I 0.06 (*)    All other components within normal limits  CBC WITH DIFFERENTIAL/PLATELET - Abnormal; Notable for the following:    WBC 14.8 (*)    RBC 3.82 (*)    Hemoglobin 10.3 (*)    HCT 32.5 (*)    MCHC 31.7 (*)    RDW 16.0 (*)    Neutro Abs 13.5 (*)    Monocytes Absolute 0.1 (*)    All other components within normal limits  PROTIME-INR - Abnormal; Notable for the following:  Prothrombin Time 16.0 (*)    All other components within normal limits  URINALYSIS COMPLETEWITH MICROSCOPIC ([facility] ONLY) - Abnormal; Notable for the following:    Color, Urine YELLOW (*)    APPearance CLEAR (*)    Glucose, UA 50 (*)    Protein, ur 30 (*)    Bacteria, UA RARE (*)    Squamous Epithelial / LPF 0-5 (*)    All other components within normal limits  MAGNESIUM - Abnormal; Notable for the following:    Magnesium 1.2 (*)    All other components within normal limits  CULTURE, BLOOD (ROUTINE X 2)  CULTURE, BLOOD (ROUTINE X 2)  CULTURE, EXPECTORATED SPUTUM-ASSESSMENT  URINE CULTURE  LACTIC ACID, PLASMA  LIPASE, BLOOD  APTT  PHOSPHORUS  URIC ACID  LACTIC ACID, PLASMA    ____________________________________________   EKG  Interpreted by me Sinus tachycardia rate 108, left axis, normal intervals. Normal QRS. Normal ST segments. T wave inversions in 1 and aVL. No significant change from 02/10/2015  ____________________________________________    RADIOLOGY  Chest x-ray unremarkable CT scan unremarkable, no evidence of perforation or obstruction or definitive infectious etiology.  ____________________________________________   PROCEDURES CRITICAL CARE Performed by: Joni Fears, Rachelanne Whidby   Total critical care time: 35 minutes  Critical care time was exclusive of separately billable procedures and treating other patients.  Critical care was necessary to treat or prevent imminent or life-threatening deterioration.  Critical care was time spent personally by me on the following activities: development of treatment plan with patient and/or surrogate as well as nursing, discussions with consultants, evaluation of patient's response to treatment, examination of patient, obtaining history from patient or surrogate, ordering and performing treatments and interventions, ordering and review of laboratory studies, ordering and review of radiographic studies, pulse oximetry and re-evaluation of patient's condition. ----------------------------------------- 1:21 PM on 03/29/2015 ----------------------------------------- Persistent fever and tachycardia which both continue to improve with IV fluids. Blood pressure remained stable. Workup essentially unremarkable, but with the severity of his clinical appearance including fever tachycardia and leukocytosis on arrival in the setting of chemotherapy and pancreatic cancer, and a high suspicion for intra-abdominal infection and we will continue IV antibiotics and hospitalized for further evaluation.   ____________________________________________   INITIAL IMPRESSION / ASSESSMENT AND PLAN / ED COURSE  Pertinent labs  & imaging results that were available during my care of the patient were reviewed by me and considered in my medical decision making (see chart for details).  Patient presents with fever and tachycardia in the setting of cancer and recent chemotherapy and radiation. Code sepsis called immediately after initial assessment. Saline bolus resuscitation, DuoNeb for the wheezing, empiric antibiotics of vancomycin and Zosyn.     ____________________________________________   FINAL CLINICAL IMPRESSION(S) / ED DIAGNOSES  Final diagnoses:  Sepsis, due to unspecified organism [facility])   suspected intra-abdominal source    Carrie Mew, MD 03/29/15 1323

## 2015-03-29 NOTE — Consult Note (Signed)
[facility] Infectious Disease     Reason for Consult Sepsis    Referring Physician: Dolores Frame Date of Admission:  03/29/2015   Active Problems:   Sepsis ([place])   Fever   HPI: [name] is a 74 y.o. male with recently dxed pancreatic cancer s/p recent EUS and ERCP admitted Nov 5-9 with with sepsis and found to have Klebsiella Pna bacteremia. He was discharged on oral keflex for a 10 day course with thought that the likely source is his GI tract given pancreatic cancer and prior procedures. Readmitted 12/7 with  Fever abd weakness.  He had recently started chemo with gemcitabine. On admit temp 102. Wbc 14.8.  Has been started on vanco and zosyn.  Ct scan abd pelvis shows no major change, no obstruction. CXR negative.  LFTs nml.  UA neg. Not neutropenic.    Per my discussion with pt and family he has no HA, ST, abd pain, cough, sob, diarrhea, dysuria, skin lesions. He feels diffusely weak and could not get oob this am but no focal weakness.  Past Medical History  Diagnosis Date  . Hyperlipidemia   . H/O cardiac catheterization 06/03/06,12/03/11  . BPH (benign prostatic hypertrophy)   . Anxiety   . Syncope     LAUGHING INDUCED  . Myocardial infarction ([place])     x 2  . CHF (congestive heart failure) ([place])   . CAD (coronary artery disease) 06/03/06    cypher 2.5 x 64m DES for 80% mid RCA sees Dr. FUbaldo Glassing . Asthma     as a child  . Sleep apnea     hx of  . Kidney stone     hx of  . GERD (gastroesophageal reflux disease)     on medication for  . Cancer ([place]     hx of skin ca in "corner of left eye"  . Arthritis   . Anemia     taking iron  . Cataract, bilateral   . Neuromuscular disorder ([place]     diabetic neuropathy  . Diabetes mellitus without complication ([place]   . Pancreatic adenocarcinoma ([facility]    Past Surgical History  Procedure Laterality Date  . Spine surgery  9/05    CERVICAL LAMINECTOMY  . Cervical herniated    . Eye surgery      as a child  . Coronary  artery bypass graft  12/12/2011    Procedure: CORONARY ARTERY BYPASS GRAFTING (CABG);  Surgeon: BGaye Pollack MD;  [facility]  Service: Open Heart Surgery;  Laterality: N/A;  coronary artery bypass graft times four using left internal mammary artery, right leg greater saphenous vein harvested endoscopically  . Cardiac catheterization N/A 10/26/2014    Procedure: Left Heart Cath;  Surgeon: AIsaias Cowman MD;  [facility];  Service: Cardiovascular;  Laterality: N/A;  . Endoscopic retrograde cholangiopancreatography (ercp) with propofol N/A 02/13/2015    Procedure: ENDOSCOPIC RETROGRADE CHOLANGIOPANCREATOGRAPHY (ERCP) WITH PROPOFOL;  Surgeon: DLucilla Lame MD;  Location: [facility] ENDOSCOPY;  Service: Endoscopy;  Laterality: N/A;  . Eus N/A 02/23/2015    Procedure: UPPER ENDOSCOPIC ULTRASOUND (EUS) LINEAR;  Surgeon: RHolly Bodily MD;  Location: [facility] ENDOSCOPY;  Service: Gastroenterology;  Laterality: N/A;   Social History  Substance Use Topics  . Smoking status: Former Smoker    Types: Cigarettes, Cigars    Quit date: 12/03/1977  . Smokeless tobacco: Never Used  . Alcohol Use: No   Family History  Problem Relation Age of Onset  .  Diabetes Mellitus II Mother     Also Brother and sister  . Pancreatic cancer Mother   . Leukemia Father   . Diabetes Mellitus II Sister   . Diabetes Mellitus II Brother     Allergies:  Allergies  Allergen Reactions  . Zithromax [Azithromycin] Anaphylaxis and Other (See Comments)    Reaction:  Stroke-like symptoms   . Lisinopril Other (See Comments)    Per spouse, pt did not tolerate well in the past.      Current antibiotics: Antibiotics Given (last 72 hours)    None      MEDICATIONS: . aspirin EC  81 mg Oral Daily  . gabapentin  300 mg Oral BID  . heparin  5,000 Units Subcutaneous 3 times per day  . insulin aspart  0-9 Units Subcutaneous TID WC  . insulin glargine  40 Units Subcutaneous BID  . meloxicam  7.5 mg Oral  Daily  . [START ON 03/30/2015] pantoprazole  40 mg Oral QAC breakfast  . piperacillin-tazobactam (ZOSYN)  IV  3.375 g Intravenous Q8H  . pravastatin  20 mg Oral q1800  . torsemide  40 mg Oral BID  . vancomycin  1,000 mg Intravenous Q18H    Review of Systems - 11 systems reviewed and negative per HPI   OBJECTIVE: Temp:  [98.4 F (36.9 C)-102.6 F (39.2 C)] 98.4 F (36.9 C) (12/07 1543) Pulse Rate:  [90-109] 90 (12/07 1543) Resp:  [17-21] 20 (12/07 1543) BP: (119-154)/(58-92) 139/92 mmHg (12/07 1543) SpO2:  [93 %-98 %] 97 % (12/07 1543) Weight:  [92.08 kg (203 lb)-94.2 kg (207 lb 10.8 oz)] 94.2 kg (207 lb 10.8 oz) (12/07 0950) Physical Exam  Constitutional: He is oriented to person, place, and time. Obese HENT: Perrla, eomi, anicteric,  Mouth/Throat: Oropharynx is clear and dry . No oropharyngeal exudate.  Cardiovascular: Normal rate, regular rhythm and normal heart sounds.  Pulmonary/Chest: Effort normal and breath sounds normal. No respiratory distress. He has no wheezes.  Abdominal: Soft. Bowel sounds are normal. He exhibits no distension. Mild ttp RUQ Lymphadenopathy:  He has no cervical adenopathy.  Neurological: He is alert and oriented to person, place, and time. Diffusely weak but nonfocal exam Skin: Skin is warm and dry. No rash noted. No erythema.  Psychiatric: He has a normal mood and affect. His behavior is normal.  Ext no cce LABS: Results for orders placed or performed during the hospital encounter of 03/29/15 (from the past 48 hour(s))  Lactic acid, plasma     Status: None   Collection Time: 03/29/15  9:55 AM  Result Value Ref Range   Lactic Acid, Venous 1.6 0.5 - 2.0 mmol/L  Comprehensive metabolic panel     Status: Abnormal   Collection Time: 03/29/15  9:55 AM  Result Value Ref Range   Sodium 139 135 - 145 mmol/L   Potassium 3.8 3.5 - 5.1 mmol/L   Chloride 109 101 - 111 mmol/L   CO2 22 22 - 32 mmol/L   Glucose, Bld 206 (H) 65 - 99 mg/dL   BUN 31 (H) 6 - 20  mg/dL   Creatinine, Ser 1.69 (H) 0.61 - 1.24 mg/dL   Calcium 8.3 (L) 8.9 - 10.3 mg/dL   Total Protein 6.4 (L) 6.5 - 8.1 g/dL   Albumin 2.8 (L) 3.5 - 5.0 g/dL   AST 35 15 - 41 U/L   ALT 23 17 - 63 U/L   Alkaline Phosphatase 92 38 - 126 U/L   Total Bilirubin 1.2 0.3 -  1.2 mg/dL   GFR calc non Af Amer 38 (L) >60 mL/min   GFR calc Af Amer 44 (L) >60 mL/min    Comment: (NOTE) The eGFR has been calculated using the CKD EPI equation. This calculation has not been validated in all clinical situations. eGFR's persistently <60 mL/min signify possible Chronic Kidney Disease.    Anion gap 8 5 - 15  Lipase, blood     Status: None   Collection Time: 03/29/15  9:55 AM  Result Value Ref Range   Lipase 20 11 - 51 U/L  Troponin I     Status: Abnormal   Collection Time: 03/29/15  9:55 AM  Result Value Ref Range   Troponin I 0.06 (H) <0.031 ng/mL    Comment: READ BACK AND VERIFIED WITH ALLEY RILEY ON 03/29/15 AT 1110 BY [place]        PERSISTENTLY INCREASED TROPONIN VALUES IN THE RANGE OF 0.04-0.49 ng/mL CAN BE SEEN IN:       -UNSTABLE ANGINA       -CONGESTIVE HEART FAILURE       -MYOCARDITIS       -CHEST TRAUMA       -ARRYHTHMIAS       -LATE PRESENTING MYOCARDIAL INFARCTION       -COPD   CLINICAL FOLLOW-UP RECOMMENDED.   CBC WITH DIFFERENTIAL     Status: Abnormal   Collection Time: 03/29/15  9:55 AM  Result Value Ref Range   WBC 14.8 (H) 3.8 - 10.6 K/uL   RBC 3.82 (L) 4.40 - 5.90 MIL/uL   Hemoglobin 10.3 (L) 13.0 - 18.0 g/dL   HCT 32.5 (L) 40.0 - 52.0 %   MCV 85.0 80.0 - 100.0 fL   MCH 26.9 26.0 - 34.0 pg   MCHC 31.7 (L) 32.0 - 36.0 g/dL   RDW 16.0 (H) 11.5 - 14.5 %   Platelets 187 150 - 440 K/uL   Neutrophils Relative % 92 %   Neutro Abs 13.5 (H) 1.4 - 6.5 K/uL   Lymphocytes Relative 7 %   Lymphs Abs 1.1 1.0 - 3.6 K/uL   Monocytes Relative 1 %   Monocytes Absolute 0.1 (L) 0.2 - 1.0 K/uL   Eosinophils Relative 0 %   Eosinophils Absolute 0.0 0 - 0.7 K/uL   Basophils Relative 0 %    Basophils Absolute 0.1 0 - 0.1 K/uL  APTT     Status: None   Collection Time: 03/29/15  9:55 AM  Result Value Ref Range   aPTT 35 24 - 36 seconds  Protime-INR     Status: Abnormal   Collection Time: 03/29/15  9:55 AM  Result Value Ref Range   Prothrombin Time 16.0 (H) 11.4 - 15.0 seconds   INR 1.27   Urinalysis complete, with microscopic ([facility] only)     Status: Abnormal   Collection Time: 03/29/15  9:55 AM  Result Value Ref Range   Color, Urine YELLOW (A) YELLOW   APPearance CLEAR (A) CLEAR   Glucose, UA 50 (A) NEGATIVE mg/dL   Bilirubin Urine NEGATIVE NEGATIVE   Ketones, ur NEGATIVE NEGATIVE mg/dL   Specific Gravity, Urine 1.017 1.005 - 1.030   Hgb urine dipstick NEGATIVE NEGATIVE   pH 5.0 5.0 - 8.0   Protein, ur 30 (A) NEGATIVE mg/dL   Nitrite NEGATIVE NEGATIVE   Leukocytes, UA NEGATIVE NEGATIVE   RBC / HPF 0-5 0 - 5 RBC/hpf   WBC, UA 0-5 0 - 5 WBC/hpf   Bacteria, UA RARE (A) NONE SEEN  Squamous Epithelial / LPF 0-5 (A) NONE SEEN   Mucous PRESENT    Hyaline Casts, UA PRESENT   Magnesium     Status: Abnormal   Collection Time: 03/29/15  9:55 AM  Result Value Ref Range   Magnesium 1.2 (L) 1.7 - 2.4 mg/dL  Phosphorus     Status: None   Collection Time: 03/29/15  9:55 AM  Result Value Ref Range   Phosphorus 2.7 2.5 - 4.6 mg/dL  Uric acid     Status: None   Collection Time: 03/29/15  9:55 AM  Result Value Ref Range   Uric Acid, Serum 7.3 4.4 - 7.6 mg/dL  Lactic acid, plasma     Status: None   Collection Time: 03/29/15 12:41 PM  Result Value Ref Range   Lactic Acid, Venous 1.4 0.5 - 2.0 mmol/L   No components found for: ESR, C REACTIVE PROTEIN MICRO: No results found for this or any previous visit (from the past 720 hour(s)).  IMAGING: Ct Abdomen Pelvis W Contrast  03/29/2015  CLINICAL DATA:  74 year old male diagnosed with pancreatic moderately differentiated adenocarcinoma last month via endoscopic ultrasound biopsy. status post chemotherapy, radiation, stent  placement. Abdominal pain and distension with fever for 2 days. Subsequent encounter. EXAM: CT ABDOMEN AND PELVIS WITH CONTRAST TECHNIQUE: Multidetector CT imaging of the abdomen and pelvis was performed using the standard protocol following bolus administration of intravenous contrast. CONTRAST:  32m OMNIPAQUE IOHEXOL 300 MG/ML  SOLN COMPARISON:  CT Abdomen and Pelvis 02/14/2015 and earlier. FINDINGS: Stable cardiomegaly. No pericardial effusion. Small layering left pleural effusion is stable. Atelectasis at the lung bases. Previous sternotomy. No acute or metastatic osseous lesion identified. Stable small fat containing left inguinal hernia. No pelvic free fluid. Negative urinary bladder. Retained stool throughout the distal colon. Redundant sigmoid with some gaseous distension. Retained stool throughout the left colon, and more proximal colon. Redundant right colon. Normal appendix. Negative terminal ileum. No dilated small bowel. Small sliding-type gastric hiatal hernia. Otherwise negative stomach. CBD stent in place. Mild gaseous distension of the duodenum which otherwise appears normal. Pneumobilia has not significantly changed. Pancreatic ductal dilatation has not significantly changed. Hypodense lesions at the pancreatic head and neck appears stable (series 2, images 31 and 32). Stable peripancreatic lymph node measuring 11 mm short axis situated between the IVC and main portal vein. Other porta hepatis nodes are stable measuring up to 12 mm short axis. Liver enhancement and gallbladder are within normal limits (small volume of pneumobilia related gas in the gallbladder). Spleen and adrenal glands within normal limits. Portal venous system is patent. Aortoiliac calcified atherosclerosis noted. Major arterial structures are patent. No other abdominal or pelvic lymphadenopathy bilateral initial phase renal enhancement is stable and within normal limits. On delayed images there is decreased left renal contrast  excretion. The right kidney appears stable. No abdominal free fluid. IMPRESSION: 1. Largely stable CT appearance of the abdomen since October. CBD stent in place. Hypodense pancreatic head and neck lesions with surrounding 11-12 mm short axis lymph nodes compatible with pancreatic adenocarcinoma. No distant metastatic disease identified in the abdomen or pelvis. 2. Lack of left renal contrast excretion on delayed images today suggesting a degree of left renal insufficiency. Electronically Signed   By: HGenevie AnnM.D.   On: 03/29/2015 12:27   Dg Chest Port 1 View  03/29/2015  CLINICAL DATA:  Fever.  Pancreatic cancer. EXAM: PORTABLE CHEST 1 VIEW COMPARISON:  Chest radiograph 02/25/2015.  CT chest 02/25/2015. FINDINGS: The heart is enlarged.  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 Thoracic atherosclerosis. Low lung volumes. No definite active infiltrates or failure. No osseous findings. IMPRESSION: Low lung volumes. No definite active infiltrates or failure. Cardiomegaly. Electronically Signed   By: Staci Righter M.D.   On: 03/29/2015 10:39   US Abdomen Limited Ruq  03/01/2015  CLINICAL DATA:  Biliary stent. EXAM: US ABDOMEN LIMITED - RIGHT UPPER QUADRANT COMPARISON:  02/14/2015 FINDINGS: Gallbladder: The gallbladder wall is prominent measuring 2.5 mm in thickness. Gallbladder sludge noted. Negative sonographic Murphy's sign. Common bile duct: Diameter: 13 mm.  Biliary stent is identified. Liver: The liver appears echogenic compatible with hepatic steatosis. No intrahepatic bile duct dilatation Other:  Trace ascites identified. IMPRESSION: 1. Increase caliber of the common bile duct with stent in place. No intrahepatic duct dilatation. 2. Gallbladder sludge. 3. Hepatic steatosis. Electronically Signed   By: Kerby Moors M.D.   On: 03/01/2015 11:33    Assessment:   [name] is a 74 y.o. male with recently dxed pancreatic cancer s/p recent EUS and ERCP admitted Nov 5-9 with with sepsis and found to have Klebsiella Pna  bacteremia. He was discharged on oral keflex for a 10 day course with thought that the likely source is his GI tract given pancreatic cancer and prior procedures. Readmitted 12/7 with  Fever and weakness.  He had recently started chemo with gemcitabine. On admit temp 102. Wbc 14.8.  Has been started on vanco and zosyn.  Ct scan abd pelvis shows no major change, no obstruction but does have continued pneumobilia . CXR negative.  LFTs nml.  UA neg. Not neutropenic.   No obvious source of fever, leukocytosis. He also had diffuse weakness but no focal findings.  I suspect he has a biliary source again given stent in place, pneumobilia.   Recommendations Continue vanco and zosyn pending culture results.  May need to treat empirically for biliary source of infection for a more prolonged period if work up negative Thank you very much for allowing me to participate in the care of this patient. Please call with questions.   Cheral Marker. Ola Spurr, MD

## 2015-03-29 NOTE — Plan of Care (Signed)
Problem: Education: Goal: Knowledge of [facility] Education information/materials will improve Outcome: Progressing  Educated pt about high fall risk precautions. Pt verbalized understanding.  Problem: Safety: Goal: Ability to remain free from injury will improve Outcome: Progressing High fall precautions initiated. Pt remains free of injury.  Problem: Pain Managment: Goal: General experience of comfort will improve Outcome: Progressing Hydrocodone given once for chronic abd pain with improvement.  Problem: Physical Regulation: Goal: Ability to maintain clinical measurements within normal limits will improve Outcome: Progressing Afebrile. VSS.  Problem: Skin Integrity: Goal: Risk for impaired skin integrity will decrease Outcome: Progressing Able to turn self. Skin intact. Foam dressing applied to sacral area for protection.  Problem: Fluid Volume: Goal: Ability to maintain a balanced intake and output will improve Outcome: Progressing PO fluids encouraged.  Problem: Nutrition: Goal: Adequate nutrition will be maintained Outcome: Progressing Poor appetite. Pt ate 25% of dinner.  Problem: Bowel/Gastric: Goal: Will not experience complications related to bowel motility Outcome: Progressing Last BM on 03/28/2015 per pt.  Problem: Fluid Volume: Goal: Hemodynamic stability will improve Outcome: Progressing Afebrile. VSS.

## 2015-03-29 NOTE — Telephone Encounter (Signed)
Patient's wife notified cancer center at 830am that patient is very weak. She can not get her husband out of the bed due to lower extremity weakness. Patient has fever of 102 orally. I spoke to Dr. Rogue Bussing who recommended that wife call EMT to transport patient to ED to r/o sepsis. Wife gave verbal understanding.

## 2015-03-29 NOTE — ED Notes (Signed)
Troponin 0.06 reported to Dr Joni Fears, no further orders at this time.

## 2015-03-29 NOTE — ED Notes (Signed)
X-ray at bedside

## 2015-03-30 ENCOUNTER — Ambulatory Visit: Payer: Commercial Managed Care - HMO

## 2015-03-30 ENCOUNTER — Other Ambulatory Visit: Payer: Self-pay | Admitting: *Deleted

## 2015-03-30 DIAGNOSIS — N400 Enlarged prostate without lower urinary tract symptoms: Secondary | ICD-10-CM

## 2015-03-30 DIAGNOSIS — I252 Old myocardial infarction: Secondary | ICD-10-CM

## 2015-03-30 DIAGNOSIS — Z87891 Personal history of nicotine dependence: Secondary | ICD-10-CM

## 2015-03-30 DIAGNOSIS — G4730 Sleep apnea, unspecified: Secondary | ICD-10-CM

## 2015-03-30 DIAGNOSIS — R5383 Other fatigue: Secondary | ICD-10-CM

## 2015-03-30 DIAGNOSIS — R0602 Shortness of breath: Secondary | ICD-10-CM

## 2015-03-30 DIAGNOSIS — C259 Malignant neoplasm of pancreas, unspecified: Secondary | ICD-10-CM

## 2015-03-30 DIAGNOSIS — E785 Hyperlipidemia, unspecified: Secondary | ICD-10-CM

## 2015-03-30 DIAGNOSIS — I509 Heart failure, unspecified: Secondary | ICD-10-CM

## 2015-03-30 DIAGNOSIS — C250 Malignant neoplasm of head of pancreas: Secondary | ICD-10-CM

## 2015-03-30 DIAGNOSIS — M129 Arthropathy, unspecified: Secondary | ICD-10-CM

## 2015-03-30 DIAGNOSIS — K219 Gastro-esophageal reflux disease without esophagitis: Secondary | ICD-10-CM

## 2015-03-30 DIAGNOSIS — I2510 Atherosclerotic heart disease of native coronary artery without angina pectoris: Secondary | ICD-10-CM

## 2015-03-30 DIAGNOSIS — E1140 Type 2 diabetes mellitus with diabetic neuropathy, unspecified: Secondary | ICD-10-CM

## 2015-03-30 DIAGNOSIS — Z87442 Personal history of urinary calculi: Secondary | ICD-10-CM

## 2015-03-30 DIAGNOSIS — Z85828 Personal history of other malignant neoplasm of skin: Secondary | ICD-10-CM

## 2015-03-30 DIAGNOSIS — Z806 Family history of leukemia: Secondary | ICD-10-CM

## 2015-03-30 DIAGNOSIS — D649 Anemia, unspecified: Secondary | ICD-10-CM

## 2015-03-30 DIAGNOSIS — R509 Fever, unspecified: Secondary | ICD-10-CM

## 2015-03-30 LAB — BASIC METABOLIC PANEL
Anion gap: 8 (ref 5–15)
BUN: 32 mg/dL — AB (ref 6–20)
CALCIUM: 8.3 mg/dL — AB (ref 8.9–10.3)
CHLORIDE: 111 mmol/L (ref 101–111)
CO2: 23 mmol/L (ref 22–32)
CREATININE: 1.87 mg/dL — AB (ref 0.61–1.24)
GFR calc non Af Amer: 34 mL/min — ABNORMAL LOW (ref >60)
GFR, EST AFRICAN AMERICAN: 39 mL/min — AB (ref >60)
Glucose, Bld: 299 mg/dL — ABNORMAL HIGH (ref 65–99)
Potassium: 4.3 mmol/L (ref 3.5–5.1)
SODIUM: 142 mmol/L (ref 135–145)

## 2015-03-30 LAB — GLUCOSE, CAPILLARY
GLUCOSE-CAPILLARY: 277 mg/dL — AB (ref 65–99)
GLUCOSE-CAPILLARY: 303 mg/dL — AB (ref 65–99)
GLUCOSE-CAPILLARY: 298 mg/dL — AB (ref 65–99)
Glucose-Capillary: 295 mg/dL — ABNORMAL HIGH (ref 65–99)

## 2015-03-30 LAB — CBC
HCT: 30.7 % — ABNORMAL LOW (ref 40.0–52.0)
Hemoglobin: 9.6 g/dL — ABNORMAL LOW (ref 13.0–18.0)
MCH: 27 pg (ref 26.0–34.0)
MCHC: 31.2 g/dL — ABNORMAL LOW (ref 32.0–36.0)
MCV: 86.3 fL (ref 80.0–100.0)
PLATELETS: 149 10*3/uL — AB (ref 150–440)
RBC: 3.56 MIL/uL — AB (ref 4.40–5.90)
RDW: 16 % — AB (ref 11.5–14.5)
WBC: 11.4 10*3/uL — AB (ref 3.8–10.6)

## 2015-03-30 LAB — URINE CULTURE: Culture: 1000

## 2015-03-30 MED ORDER — HYDROMORPHONE HCL 1 MG/ML IJ SOLN
1.0000 mg | INTRAMUSCULAR | Status: DC | PRN
  Administered 2015-03-30 – 2015-04-06 (×17): 1 mg via INTRAVENOUS
  Filled 2015-03-30 (×17): qty 1

## 2015-03-30 MED ORDER — OXYCODONE-ACETAMINOPHEN 5-325 MG PO TABS
1.0000 | ORAL_TABLET | Freq: Four times a day (QID) | ORAL | Status: DC | PRN
  Administered 2015-03-31 – 2015-04-05 (×13): 1 via ORAL
  Filled 2015-03-30 (×14): qty 1

## 2015-03-30 MED ORDER — INSULIN GLARGINE 100 UNIT/ML ~~LOC~~ SOLN
60.0000 [IU] | Freq: Two times a day (BID) | SUBCUTANEOUS | Status: DC
  Administered 2015-03-30: 60 [IU] via SUBCUTANEOUS
  Filled 2015-03-30 (×3): qty 0.6

## 2015-03-30 MED ORDER — IPRATROPIUM-ALBUTEROL 0.5-2.5 (3) MG/3ML IN SOLN
3.0000 mL | Freq: Four times a day (QID) | RESPIRATORY_TRACT | Status: DC | PRN
  Administered 2015-04-01 – 2015-04-06 (×4): 3 mL via RESPIRATORY_TRACT
  Filled 2015-03-30 (×4): qty 3

## 2015-03-30 NOTE — Progress Notes (Signed)
PT Cancellation Note  Patient Details Name: SERGI PICKRON MRN: [id number] DOB: 1940/06/05   Cancelled Treatment:    Reason Eval/Treat Not Completed: Medical issues which prohibited therapy. PT reviewed chart and discussed with RN. Per RN patient is running a fever and would prefer for PT to hold mobility evaluation until later in the day. PT will re-attempt as appropriate and able.   Kerman Passey, PT, DPT    03/30/2015, 9:12 AM

## 2015-03-30 NOTE — Progress Notes (Signed)
[facility]  Patient Status: active  [facility] is providing the following services: SN  If patient discharges after hours, please call [phone].   [name] 03/30/2015, 10:12 AM

## 2015-03-30 NOTE — Progress Notes (Signed)
ID E note Defervescing, wbc decreasing bcx neg  Rec Would cont zosyn, DC vanco Follow cultures

## 2015-03-30 NOTE — Consult Note (Signed)
[place] CONSULT NOTE  Patient Care Team: Rusty Aus, MD as PCP - General (Unknown Physician Specialty) Clent Jacks, RN as Registered Nurse  CHIEF COMPLAINTS/PURPOSE OF CONSULTATION: Pancreatic cancer/fever  HISTORY OF PRESENTING ILLNESS:  [name] 74 y.o.  male Caucasian patient with recently diagnosed pancreatic cancer stage IIB and also history of obstructive jaundice status post stent placement in November 2016- was recently discharged from the hospital for Klebsiella pneumoniae bacteremia. Patient was discharged home on Keflex.  Patient started on single agent gemcitabine-along with concurrent radiation on December 5. 2 days later patient noted to have profound weakness; inability to get out of the chair. He also had high temperatures approximately 102 at home. He also had sweating episodes. EMS was called and then brought to the hospital.  Course in the emergency room: CT of the abdomen and pelvis did not show any progression of his pancreatic malignancy; showed the stent in place. LFTs within normal limits. White count is elevated at 14.8. Chest x-ray unremarkable. Patient has been started on vancomycin and Zosyn.  ROS: Denies any unusual cough or shortness of breath. Has chronic mild shortness of breath. No worsening swelling in the legs. A complete 10 point review of system is done which is negative except mentioned above in history of present illness  MEDICAL HISTORY:  Past Medical History  Diagnosis Date  . Hyperlipidemia   . H/O cardiac catheterization 06/03/06,12/03/11  . BPH (benign prostatic hypertrophy)   . Anxiety   . Syncope     LAUGHING INDUCED  . Myocardial infarction ([place])     x 2  . CHF (congestive heart failure) ([place])   . CAD (coronary artery disease) 06/03/06    cypher 2.5 x 58mm DES for 80% mid RCA sees Dr. Ubaldo Glassing  . Asthma     as a child  . Sleep apnea     hx of  . Kidney stone     hx of  . GERD (gastroesophageal reflux  disease)     on medication for  . Cancer ([place])     hx of skin ca in "corner of left eye"  . Arthritis   . Anemia     taking iron  . Cataract, bilateral   . Neuromuscular disorder ([place])     diabetic neuropathy  . Diabetes mellitus without complication ([place])   . Pancreatic adenocarcinoma [facility])     SURGICAL HISTORY: Past Surgical History  Procedure Laterality Date  . Spine surgery  9/05    CERVICAL LAMINECTOMY  . Cervical herniated    . Eye surgery      as a child  . Coronary artery bypass graft  12/12/2011    Procedure: CORONARY ARTERY BYPASS GRAFTING (CABG);  Surgeon: Gaye Pollack, MD;  Location: [facility];  Service: Open Heart Surgery;  Laterality: N/A;  coronary artery bypass graft times four using left internal mammary artery, right leg greater saphenous vein harvested endoscopically  . Cardiac catheterization N/A 10/26/2014    Procedure: Left Heart Cath;  Surgeon: Isaias Cowman, MD;  Location: [facility];  Service: Cardiovascular;  Laterality: N/A;  . Endoscopic retrograde cholangiopancreatography (ercp) with propofol N/A 02/13/2015    Procedure: ENDOSCOPIC RETROGRADE CHOLANGIOPANCREATOGRAPHY (ERCP) WITH PROPOFOL;  Surgeon: Lucilla Lame, MD;  Location: [facility] ENDOSCOPY;  Service: Endoscopy;  Laterality: N/A;  . Eus N/A 02/23/2015    Procedure: UPPER ENDOSCOPIC ULTRASOUND (EUS) LINEAR;  Surgeon: Holly Bodily, MD;  Location: [facility] ENDOSCOPY;  Service: Gastroenterology;  Laterality: N/A;    SOCIAL HISTORY: Social History   Social History  . Marital Status: Married    Spouse Name: N/A  . Number of Children: N/A  . Years of Education: N/A   Occupational History  . Not on file.   Social History Main Topics  . Smoking status: Former Smoker    Types: Cigarettes, Cigars    Quit date: 12/03/1977  . Smokeless tobacco: Never Used  . Alcohol Use: No  . Drug Use: No  . Sexual Activity: Not on file   Other Topics Concern  . Not on file   Social History  Narrative   Lives with his wife    FAMILY HISTORY: Family History  Problem Relation Age of Onset  . Diabetes Mellitus II Mother     Also Brother and sister  . Pancreatic cancer Mother   . Leukemia Father   . Diabetes Mellitus II Sister   . Diabetes Mellitus II Brother     ALLERGIES:  is allergic to zithromax and lisinopril.  MEDICATIONS:  Current Facility-Administered Medications  Medication Dose Route Frequency Provider Last Rate Last Dose  . acetaminophen (TYLENOL) tablet 650 mg  650 mg Oral Q6H PRN Vaughan Basta, MD   650 mg at 03/29/15 2310  . aspirin EC tablet 81 mg  81 mg Oral Daily Vaughan Basta, MD   81 mg at 03/30/15 0821  . gabapentin (NEURONTIN) capsule 300 mg  300 mg Oral BID Vaughan Basta, MD   300 mg at 03/30/15 [id number]  . heparin injection 5,000 Units  5,000 Units Subcutaneous 3 times per day Vaughan Basta, MD   5,000 Units at 03/30/15 0417  . HYDROcodone-acetaminophen (NORCO/VICODIN) 5-325 MG per tablet 1 tablet  1 tablet Oral Q6H PRN Vaughan Basta, MD   1 tablet at 03/30/15 0752  . insulin aspart (novoLOG) injection 0-9 Units  0-9 Units Subcutaneous TID AC & HS Vaughan Basta, MD   5 Units at 03/30/15 [phone]  . insulin glargine (LANTUS) injection 40 Units  40 Units Subcutaneous BID Vaughan Basta, MD   40 Units at 03/29/15 1733  . meloxicam (MOBIC) tablet 7.5 mg  7.5 mg Oral Daily Vaughan Basta, MD   7.5 mg at 03/30/15 [id number]  . ondansetron (ZOFRAN) tablet 4 mg  4 mg Oral Q8H PRN Vaughan Basta, MD      . pantoprazole (PROTONIX) EC tablet 40 mg  40 mg Oral QAC breakfast Vaughan Basta, MD   40 mg at 03/30/15 [id number]  . piperacillin-tazobactam (ZOSYN) IVPB 3.375 g  3.375 g Intravenous Q8H Carrie Mew, MD   3.375 g at 03/30/15 0417  . polyethylene glycol powder (GLYCOLAX/MIRALAX) container 17 g  17 g Oral Daily PRN Vaughan Basta, MD      . pravastatin (PRAVACHOL) tablet 20 mg  20 mg Oral  q1800 Vaughan Basta, MD   20 mg at 03/29/15 1822  . torsemide (DEMADEX) tablet 40 mg  40 mg Oral BID Vaughan Basta, MD   40 mg at 03/30/15 0821  . vancomycin (VANCOCIN) IVPB 1000 mg/200 mL premix  1,000 mg Intravenous Q18H Carrie Mew, MD   1,000 mg at 03/29/15 2237      .  PHYSICAL EXAMINATION:   Filed Vitals:   03/30/15 0536 03/30/15 0800  BP: 134/64   Pulse: 92   Temp: 98.9 F (37.2 C) 100.6 F (38.1 C)  Resp: 20    Filed Weights   03/29/15 0948 03/29/15 0950  Weight: 203 lb (92.08 kg) 207 lb 10.8 oz (94.2  kg)    GENERAL: Well-nourished well-developed; Alert, no distress and comfortable. Accompanied by wife. EYES: No icterus OROPHARYNX: no thrush or ulceration; poor dentition NECK: supple, no masses felt LYMPH: no palpable lymphadenopathy in the cervical, axillary or inguinal regions LUNGS: clear to auscultation and No wheeze or crackles HEART/CVS: regular rate & rhythm and no murmurs; 1+ bilateral lower extremity edema ABDOMEN: abdomen soft, non-tender and normal bowel sounds Musculoskeletal:no cyanosis of digits and no clubbing  PSYCH: alert & oriented x 3 with flat affect/appears depressed NEURO: no focal motor/sensory deficits SKIN: no rashes or significant lesions  LABORATORY DATA:  I have reviewed the data as listed Lab Results  Component Value Date   WBC 11.4* 03/30/2015   HGB 9.6* 03/30/2015   HCT 30.7* 03/30/2015   MCV 86.3 03/30/2015   PLT 149* 03/30/2015    Recent Labs  02/25/15 1204  02/28/15 0413 03/23/15 0837 03/29/15 0955 03/30/15 0805  NA 139  < > 140 137 139 142  K 3.7  < > 3.4* 4.0 3.8 4.3  CL 107  < > 103 105 109 111  CO2 25  < > 27 25 22 23   GLUCOSE 122*  < > 152* 288* 206* 299*  BUN 26*  < > 25* 22* 31* 32*  CREATININE 2.02*  < > 1.58* 1.55* 1.69* 1.87*  CALCIUM 8.0*  < > 8.8* 8.3* 8.3* 8.3*  GFRNONAA 31*  < > 41* 42* 38* 34*  GFRAA 36*  < > 48* 49* 44* 39*  PROT 6.3*  < > 6.7 6.5 6.4*  --   ALBUMIN  2.6*  < > 2.6* 3.0* 2.8*  --   AST 96*  < > 32 31 35  --   ALT 55  < > 30 23 23   --   ALKPHOS 251*  < > 211* 133* 92  --   BILITOT 3.4*  < > 2.3* 1.1 1.2  --   BILIDIR 1.8*  --  1.1*  --   --   --   IBILI 1.6*  --  1.2*  --   --   --   < > = values in this interval not displayed.  RADIOGRAPHIC STUDIES: I have personally reviewed the radiological images as listed and agreed with the findings in the report. Ct Abdomen Pelvis W Contrast  03/29/2015  CLINICAL DATA:  74 year old male diagnosed with pancreatic moderately differentiated adenocarcinoma last month via endoscopic ultrasound biopsy. status post chemotherapy, radiation, stent placement. Abdominal pain and distension with fever for 2 days. Subsequent encounter. EXAM: CT ABDOMEN AND PELVIS WITH CONTRAST TECHNIQUE: Multidetector CT imaging of the abdomen and pelvis was performed using the standard protocol following bolus administration of intravenous contrast. CONTRAST:  42mL OMNIPAQUE IOHEXOL 300 MG/ML  SOLN COMPARISON:  CT Abdomen and Pelvis 02/14/2015 and earlier. FINDINGS: Stable cardiomegaly. No pericardial effusion. Small layering left pleural effusion is stable. Atelectasis at the lung bases. Previous sternotomy. No acute or metastatic osseous lesion identified. Stable small fat containing left inguinal hernia. No pelvic free fluid. Negative urinary bladder. Retained stool throughout the distal colon. Redundant sigmoid with some gaseous distension. Retained stool throughout the left colon, and more proximal colon. Redundant right colon. Normal appendix. Negative terminal ileum. No dilated small bowel. Small sliding-type gastric hiatal hernia. Otherwise negative stomach. CBD stent in place. Mild gaseous distension of the duodenum which otherwise appears normal. Pneumobilia has not significantly changed. Pancreatic ductal dilatation has not significantly changed. Hypodense lesions at the pancreatic head and neck appears  stable (series 2, images  31 and 32). Stable peripancreatic lymph node measuring 11 mm short axis situated between the IVC and main portal vein. Other porta hepatis nodes are stable measuring up to 12 mm short axis. Liver enhancement and gallbladder are within normal limits (small volume of pneumobilia related gas in the gallbladder). Spleen and adrenal glands within normal limits. Portal venous system is patent. Aortoiliac calcified atherosclerosis noted. Major arterial structures are patent. No other abdominal or pelvic lymphadenopathy bilateral initial phase renal enhancement is stable and within normal limits. On delayed images there is decreased left renal contrast excretion. The right kidney appears stable. No abdominal free fluid. IMPRESSION: 1. Largely stable CT appearance of the abdomen since October. CBD stent in place. Hypodense pancreatic head and neck lesions with surrounding 11-12 mm short axis lymph nodes compatible with pancreatic adenocarcinoma. No distant metastatic disease identified in the abdomen or pelvis. 2. Lack of left renal contrast excretion on delayed images today suggesting a degree of left renal insufficiency. Electronically Signed   By: Genevie Ann M.D.   On: 03/29/2015 12:27   Dg Chest Port 1 View  03/29/2015  CLINICAL DATA:  Fever.  Pancreatic cancer. EXAM: PORTABLE CHEST 1 VIEW COMPARISON:  Chest radiograph 02/25/2015.  CT chest 02/25/2015. FINDINGS: The heart is enlarged. 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 Thoracic atherosclerosis. Low lung volumes. No definite active infiltrates or failure. No osseous findings. IMPRESSION: Low lung volumes. No definite active infiltrates or failure. Cardiomegaly. Electronically Signed   By: Staci Righter M.D.   On: 03/29/2015 10:39   US Abdomen Limited Ruq  03/01/2015  CLINICAL DATA:  Biliary stent. EXAM: US ABDOMEN LIMITED - RIGHT UPPER QUADRANT COMPARISON:  02/14/2015 FINDINGS: Gallbladder: The gallbladder wall is prominent measuring 2.5 mm in thickness. Gallbladder sludge noted.  Negative sonographic Murphy's sign. Common bile duct: Diameter: 13 mm.  Biliary stent is identified. Liver: The liver appears echogenic compatible with hepatic steatosis. No intrahepatic bile duct dilatation Other:  Trace ascites identified. IMPRESSION: 1. Increase caliber of the common bile duct with stent in place. No intrahepatic duct dilatation. 2. Gallbladder sludge. 3. Hepatic steatosis. Electronically Signed   By: Kerby Moors M.D.   On: 03/01/2015 11:33    ASSESSMENT & PLAN:  74 year old male patient with a history of stage II pancreatic cancer currently on chemotherapy and radiation; admitted to the hospital for high-grade fevers  # High-grade fevers- unclear etiology. Given the clinical picture suspicious of sepsis. Await blood cultures. Question gemcitabine-induced fevers. Continue broad-spectrum antibiotics. Question need for ERCP /stent reevaluation. Appreciate GI evaluation  # Pancreatic cancer-stage II B- no obvious progression noted on the CT scan done in the emergency room. However given the multiple episodes of hospitalizations/declining performance status; further aggressive treatment would be a challenge for this patient. We have not made any decisions yet regarding further management of pancreatic cancer at this time.  All questions were answered. The above plan of care was discussed with the patient and his wife in detail.  # I independently reviewed the images for CAT scan; summarized as above.  Thank you Dr. Marthann Schiller for allowing me to participate in the care of your pleasant patient. Please do not hesitate to contact me with questions or concerns in the interim.   Cammie Sickle, MD 03/30/2015 8:57 AM

## 2015-03-30 NOTE — Care Management (Cosign Needed)
Patient is open to [place] [facility]. Please resume care at discharge if appropriate.

## 2015-03-30 NOTE — Clinical Documentation Improvement (Signed)
Internal Medicine  #1 of 1:  Abnormal Lab/Test Results:   Component      BUN Creatinine  Latest Ref Rng      6 - 20 mg/dL 0.61 - 1.24 mg/dL  03/29/2015      9:55 AM 31 (H) 1.69 (H)  03/30/2015      8:05 AM 32 (H) 1.87 (H)   Component      EGFR (Non-African Amer.)  Latest Ref Rng      >60 mL/min  03/29/2015      9:55 AM 38 (L)  03/30/2015      8:05 AM 34 (L)    Possible Clinical Conditions associated with below indicators  Chronic kidney disease (please specify stage if known)  Acute kidney injury   Acute kidney injury on chronic kidney disease (please specify stage of ckd if known)  Other Condition  Cannot Clinically Determine  #2 of 2:   Component      Hemoglobin HCT  Latest Ref Rng      13.0 - 18.0 g/dL 40.0 - 52.0 %  03/29/2015      9:55 AM 10.3 (L) 32.5 (L)  03/30/2015      8:05 AM 9.6 (L) 30.7 (L)    Anemia (please specify acuity and type)  Other condition  Unable to determine at present  Please exercise your independent, professional judgment when responding. A specific answer is not anticipated or expected.Please update your documentation within the medical record to reflect your response to this query.

## 2015-03-30 NOTE — Progress Notes (Unsigned)
PSN spoke with patient's wife on 03/28/15 to discuss financial concerns.  She reported that there was concern about an oral drug being prescribed, but MD changed to an IV chemo drug.  PSN was going to call patient's wife yesterday, but patient was admitted to the hospital.

## 2015-03-30 NOTE — Progress Notes (Signed)
[place] at [facility]                                                                                                                                                                                            Patient Demographics   [name], is a 74 y.o. male, DOB - February 11, 1941, [id number]  Admit date - 03/29/2015   Admitting Physician Vaughan Basta, MD  Outpatient Primary MD for the patient is Rusty Aus., MD   LOS - 1  Subjective: Patient admitted with abdominal pain and fever with diagnosis of pancreatic cancer. Recent Klebsiella pneumonia in his blood. Continues to have some abdominal pain.     Review of Systems:   CONSTITUTIONAL: No documented fever. No fatigue, weakness. No weight gain, no weight loss.  EYES: No blurry or double vision.  ENT: No tinnitus. No postnasal drip. No redness of the oropharynx.  RESPIRATORY: No cough, no wheeze, no hemoptysis. No dyspnea.  CARDIOVASCULAR: No chest pain. No orthopnea. No palpitations. No syncope.  GASTROINTESTINAL: No nausea, no vomiting or diarrhea.  Positive abdominal pain. No melena or hematochezia.  GENITOURINARY: No dysuria or hematuria.  ENDOCRINE: No polyuria or nocturia. No heat or cold intolerance.  HEMATOLOGY: No anemia. No bruising. No bleeding.  INTEGUMENTARY: No rashes. No lesions.  MUSCULOSKELETAL: No arthritis. No swelling. No gout.  NEUROLOGIC: No numbness, tingling, or ataxia. No seizure-type activity.  PSYCHIATRIC: No anxiety. No insomnia. No ADD.    Vitals:   Filed Vitals:   03/30/15 0800 03/30/15 0900 03/30/15 1038 03/30/15 1413  BP:  129/52  120/49  Pulse:  99  87  Temp: 100.6 F (38.1 C) 100.6 F (38.1 C) 98.2 F (36.8 C) 99 F (37.2 C)  TempSrc: Oral Oral Oral Oral  Resp:  20    Height:      Weight:      SpO2:  96%  95%    Wt Readings from Last 3 Encounters:  03/29/15 94.2 kg (207 lb 10.8 oz)  03/23/15 92.4 kg (203 lb 11.3 oz)   03/09/15 93.35 kg (205 lb 12.8 oz)     Intake/Output Summary (Last 24 hours) at 03/30/15 1432 Last data filed at 03/30/15 1100  Gross per 24 hour  Intake    360 ml  Output      0 ml  Net    360 ml    Physical Exam:   GENERAL: Pleasant-appearing in no apparent distress. Chronically ill-appearing male HEAD, EYES, EARS, NOSE AND THROAT: Atraumatic, normocephalic. Extraocular muscles are intact. Pupils equal and reactive to light. Sclerae anicteric. No conjunctival injection. No oro-pharyngeal erythema.  NECK: Supple. There is no jugular venous distention. No bruits, no lymphadenopathy, no thyromegaly.  HEART: Regular rate and rhythm,. No murmurs, no rubs, no clicks.  LUNGS: Clear to auscultation bilaterally. No rales or rhonchi. No wheezes.  ABDOMEN: Soft, flat, mild epigastric tenderness, nondistended. Has good bowel sounds. No hepatosplenomegaly appreciated.  EXTREMITIES: No evidence of any cyanosis, clubbing, or peripheral edema.  +2 pedal and radial pulses bilaterally.  NEUROLOGIC: The patient is alert, awake, and oriented x3 with no focal motor or sensory deficits appreciated bilaterally.  SKIN: Moist and warm with no rashes appreciated.  Psych: Not anxious, depressed LN: No inguinal LN enlargement    Antibiotics   Anti-infectives    Start     Dose/Rate Route Frequency Ordered Stop   03/29/15 2200  vancomycin (VANCOCIN) IVPB 1000 mg/200 mL premix     1,000 mg 200 mL/hr over 60 Minutes Intravenous Every 18 hours 03/29/15 1324     03/29/15 1900  piperacillin-tazobactam (ZOSYN) IVPB 3.375 g     3.375 g 12.5 mL/hr over 240 Minutes Intravenous Every 8 hours 03/29/15 1324     03/29/15 1000  piperacillin-tazobactam (ZOSYN) IVPB 3.375 g     3.375 g 100 mL/hr over 30 Minutes Intravenous  Once 03/29/15 0949 03/29/15 1340   03/29/15 1000  vancomycin (VANCOCIN) 1,500 mg in sodium chloride 0.9 % 500 mL IVPB     1,500 mg 250 mL/hr over 120 Minutes Intravenous  Once 03/29/15 0949  03/29/15 1238      Medications   Scheduled Meds: . aspirin EC  81 mg Oral Daily  . gabapentin  300 mg Oral BID  . heparin  5,000 Units Subcutaneous 3 times per day  . insulin aspart  0-9 Units Subcutaneous TID AC & HS  . insulin glargine  40 Units Subcutaneous BID  . meloxicam  7.5 mg Oral Daily  . pantoprazole  40 mg Oral QAC breakfast  . piperacillin-tazobactam (ZOSYN)  IV  3.375 g Intravenous Q8H  . pravastatin  20 mg Oral q1800  . torsemide  40 mg Oral BID  . vancomycin  1,000 mg Intravenous Q18H   Continuous Infusions:  PRN Meds:.acetaminophen, HYDROmorphone (DILAUDID) injection, ondansetron, oxyCODONE-acetaminophen, polyethylene glycol powder   Data Review:   Micro Results Recent Results (from the past 240 hour(s))  Urine culture     Status: None   Collection Time: 03/29/15  9:55 AM  Result Value Ref Range Status   Specimen Description URINE, RANDOM  Final   Special Requests NONE  Final   Culture 1,000 COLONIES/mL INSIGNIFICANT GROWTH   Final   Report Status 03/30/2015 FINAL  Final  Blood Culture (routine x 2)     Status: None (Preliminary result)   Collection Time: 03/29/15 10:00 AM  Result Value Ref Range Status   Specimen Description BLOOD LEFT HAND  Final   Special Requests BOTTLES DRAWN AEROBIC AND ANAEROBIC  11 CC  Final   Culture NO GROWTH 1 DAY  Final   Report Status PENDING  Incomplete  Blood Culture (routine x 2)     Status: None (Preliminary result)   Collection Time: 03/29/15 10:08 AM  Result Value Ref Range Status   Specimen Description BLOOD LEFT ARM  Final   Special Requests   Final    BOTTLES DRAWN AEROBIC AND ANAEROBIC  1CC ANAERO 3CC AERO   Culture NO GROWTH 1 DAY  Final   Report Status PENDING  Incomplete    Radiology Reports Ct Abdomen Pelvis W Contrast  03/29/2015  CLINICAL DATA:  74 year old male diagnosed with pancreatic moderately differentiated adenocarcinoma last month via endoscopic ultrasound biopsy. status post chemotherapy,  radiation, stent placement. Abdominal pain and distension with fever for 2 days. Subsequent encounter. EXAM: CT ABDOMEN AND PELVIS WITH CONTRAST TECHNIQUE: Multidetector CT imaging of the abdomen and pelvis was performed using the standard protocol following bolus administration of intravenous contrast. CONTRAST:  87mL OMNIPAQUE IOHEXOL 300 MG/ML  SOLN COMPARISON:  CT Abdomen and Pelvis 02/14/2015 and earlier. FINDINGS: Stable cardiomegaly. No pericardial effusion. Small layering left pleural effusion is stable. Atelectasis at the lung bases. Previous sternotomy. No acute or metastatic osseous lesion identified. Stable small fat containing left inguinal hernia. No pelvic free fluid. Negative urinary bladder. Retained stool throughout the distal colon. Redundant sigmoid with some gaseous distension. Retained stool throughout the left colon, and more proximal colon. Redundant right colon. Normal appendix. Negative terminal ileum. No dilated small bowel. Small sliding-type gastric hiatal hernia. Otherwise negative stomach. CBD stent in place. Mild gaseous distension of the duodenum which otherwise appears normal. Pneumobilia has not significantly changed. Pancreatic ductal dilatation has not significantly changed. Hypodense lesions at the pancreatic head and neck appears stable (series 2, images 31 and 32). Stable peripancreatic lymph node measuring 11 mm short axis situated between the IVC and main portal vein. Other porta hepatis nodes are stable measuring up to 12 mm short axis. Liver enhancement and gallbladder are within normal limits (small volume of pneumobilia related gas in the gallbladder). Spleen and adrenal glands within normal limits. Portal venous system is patent. Aortoiliac calcified atherosclerosis noted. Major arterial structures are patent. No other abdominal or pelvic lymphadenopathy bilateral initial phase renal enhancement is stable and within normal limits. On delayed images there is decreased  left renal contrast excretion. The right kidney appears stable. No abdominal free fluid. IMPRESSION: 1. Largely stable CT appearance of the abdomen since October. CBD stent in place. Hypodense pancreatic head and neck lesions with surrounding 11-12 mm short axis lymph nodes compatible with pancreatic adenocarcinoma. No distant metastatic disease identified in the abdomen or pelvis. 2. Lack of left renal contrast excretion on delayed images today suggesting a degree of left renal insufficiency. Electronically Signed   By: Genevie Ann M.D.   On: 03/29/2015 12:27   Dg Chest Port 1 View  03/29/2015  CLINICAL DATA:  Fever.  Pancreatic cancer. EXAM: PORTABLE CHEST 1 VIEW COMPARISON:  Chest radiograph 02/25/2015.  CT chest 02/25/2015. FINDINGS: The heart is enlarged. 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 Thoracic atherosclerosis. Low lung volumes. No definite active infiltrates or failure. No osseous findings. IMPRESSION: Low lung volumes. No definite active infiltrates or failure. Cardiomegaly. Electronically Signed   By: Staci Righter M.D.   On: 03/29/2015 10:39   US Abdomen Limited Ruq  03/01/2015  CLINICAL DATA:  Biliary stent. EXAM: US ABDOMEN LIMITED - RIGHT UPPER QUADRANT COMPARISON:  02/14/2015 FINDINGS: Gallbladder: The gallbladder wall is prominent measuring 2.5 mm in thickness. Gallbladder sludge noted. Negative sonographic Murphy's sign. Common bile duct: Diameter: 13 mm.  Biliary stent is identified. Liver: The liver appears echogenic compatible with hepatic steatosis. No intrahepatic bile duct dilatation Other:  Trace ascites identified. IMPRESSION: 1. Increase caliber of the common bile duct with stent in place. No intrahepatic duct dilatation. 2. Gallbladder sludge. 3. Hepatic steatosis. Electronically Signed   By: Kerby Moors M.D.   On: 03/01/2015 11:33     CBC  Recent Labs Lab 03/29/15 0955 03/30/15 0805  WBC 14.8* 11.4*  HGB 10.3* 9.6*  HCT 32.5*  30.7*  PLT 187 149*  MCV 85.0 86.3  MCH 26.9 27.0  MCHC  31.7* 31.2*  RDW 16.0* 16.0*  LYMPHSABS 1.1  --   MONOABS 0.1*  --   EOSABS 0.0  --   BASOSABS 0.1  --     Chemistries   Recent Labs Lab 03/29/15 0955 03/30/15 0805  NA 139 142  K 3.8 4.3  CL 109 111  CO2 22 23  GLUCOSE 206* 299*  BUN 31* 32*  CREATININE 1.69* 1.87*  CALCIUM 8.3* 8.3*  MG 1.2*  --   AST 35  --   ALT 23  --   ALKPHOS 92  --   BILITOT 1.2  --    ------------------------------------------------------------------------------------------------------------------ estimated creatinine clearance is 38.6 mL/min (by C-G formula based on Cr of 1.87). ------------------------------------------------------------------------------------------------------------------ No results for input(s): HGBA1C in the last 72 hours. ------------------------------------------------------------------------------------------------------------------ No results for input(s): CHOL, HDL, LDLCALC, TRIG, CHOLHDL, LDLDIRECT in the last 72 hours. ------------------------------------------------------------------------------------------------------------------ No results for input(s): TSH, T4TOTAL, T3FREE, THYROIDAB in the last 72 hours.  Invalid input(s): FREET3 ------------------------------------------------------------------------------------------------------------------ No results for input(s): VITAMINB12, FOLATE, FERRITIN, TIBC, IRON, RETICCTPCT in the last 72 hours.  Coagulation profile  Recent Labs Lab 03/29/15 0955  INR 1.27    No results for input(s): DDIMER in the last 72 hours.  Cardiac Enzymes  Recent Labs Lab 03/29/15 0955  TROPONINI 0.06*   ------------------------------------------------------------------------------------------------------------------ Invalid input(s): POCBNP    Assessment & Plan   * Sepsis Evident by fever, tachycardia, elevated white cell count, generalized weakness. So for cultures are negative. Continue broad-spectrum antibiotics CT  of the abdomen pelvis no evidence of infection Appreciate ID input   * Adenocarcinoma of the pancreas Follows with cancer Center, recently started on chemotherapy and radiation. Prognosis likely very poor  * Diabetes Blood sugar elevated changed Lantus back to home dose Lantus 60 units twice a day  * History of coronary artery disease On statin and aspirin, continue these     Code Status Orders        Start     Ordered   03/29/15 1527  Full code   Continuous     03/29/15 1526    Advance Directive Documentation        Most Recent Value   Type of Advance Directive  Healthcare Power of Attorney, Living will   Pre-existing out of facility DNR order (yellow form or pink MOST form)     "MOST" Form in Place?             Consults  infectious disease DVT Prophylaxis  heparin  Lab Results  Component Value Date   PLT 149* 03/30/2015     Time Spent in minutes 35 minutes   Dustin Flock M.D on 03/30/2015 at 2:32 PM  Between 7am to 6pm - Pager - [phone]  After 6pm go to [email or web address] - password [email or web address] [facility]   Office  [phone]

## 2015-03-30 NOTE — Plan of Care (Signed)
Problem: Safety: Goal: Ability to remain free from injury will improve Outcome: Progressing On high falls precaution per policy.  Problem: Pain Managment: Goal: General experience of comfort will improve Outcome: Progressing No complaints of pain.  Problem: Physical Regulation: Goal: Ability to maintain clinical measurements within normal limits will improve Outcome: Progressing Incontinent of bowel and bladder.  Problem: Fluid Volume: Goal: Hemodynamic stability will improve Outcome: Progressing Continues on ABX

## 2015-03-31 ENCOUNTER — Inpatient Hospital Stay: Payer: Commercial Managed Care - HMO

## 2015-03-31 ENCOUNTER — Ambulatory Visit: Payer: Commercial Managed Care - HMO

## 2015-03-31 DIAGNOSIS — R630 Anorexia: Secondary | ICD-10-CM

## 2015-03-31 LAB — CBC
HCT: 28.5 % — ABNORMAL LOW (ref 40.0–52.0)
HEMOGLOBIN: 9 g/dL — AB (ref 13.0–18.0)
MCH: 27.4 pg (ref 26.0–34.0)
MCHC: 31.6 g/dL — ABNORMAL LOW (ref 32.0–36.0)
MCV: 86.6 fL (ref 80.0–100.0)
PLATELETS: 162 10*3/uL (ref 150–440)
RBC: 3.29 MIL/uL — AB (ref 4.40–5.90)
RDW: 15.7 % — ABNORMAL HIGH (ref 11.5–14.5)
WBC: 7.9 10*3/uL (ref 3.8–10.6)

## 2015-03-31 LAB — BASIC METABOLIC PANEL
ANION GAP: 7 (ref 5–15)
BUN: 37 mg/dL — ABNORMAL HIGH (ref 6–20)
CALCIUM: 8.2 mg/dL — AB (ref 8.9–10.3)
CO2: 25 mmol/L (ref 22–32)
Chloride: 111 mmol/L (ref 101–111)
Creatinine, Ser: 2.16 mg/dL — ABNORMAL HIGH (ref 0.61–1.24)
GFR, EST AFRICAN AMERICAN: 33 mL/min — AB (ref >60)
GFR, EST NON AFRICAN AMERICAN: 28 mL/min — AB (ref >60)
GLUCOSE: 352 mg/dL — AB (ref 65–99)
POTASSIUM: 3.9 mmol/L (ref 3.5–5.1)
SODIUM: 143 mmol/L (ref 135–145)

## 2015-03-31 LAB — GLUCOSE, CAPILLARY
GLUCOSE-CAPILLARY: 340 mg/dL — AB (ref 65–99)
GLUCOSE-CAPILLARY: 375 mg/dL — AB (ref 65–99)
GLUCOSE-CAPILLARY: 309 mg/dL — AB (ref 65–99)
Glucose-Capillary: 292 mg/dL — ABNORMAL HIGH (ref 65–99)

## 2015-03-31 MED ORDER — INSULIN ASPART 100 UNIT/ML ~~LOC~~ SOLN
4.0000 [IU] | Freq: Three times a day (TID) | SUBCUTANEOUS | Status: DC
  Administered 2015-03-31: 4 [IU] via SUBCUTANEOUS
  Filled 2015-03-31: qty 4

## 2015-03-31 MED ORDER — SODIUM CHLORIDE 0.9 % IV SOLN
INTRAVENOUS | Status: DC
  Administered 2015-03-31: 12:00:00 via INTRAVENOUS

## 2015-03-31 MED ORDER — INSULIN GLARGINE 100 UNIT/ML ~~LOC~~ SOLN
66.0000 [IU] | Freq: Two times a day (BID) | SUBCUTANEOUS | Status: DC
  Administered 2015-03-31 – 2015-04-01 (×2): 66 [IU] via SUBCUTANEOUS
  Filled 2015-03-31 (×3): qty 0.66

## 2015-03-31 NOTE — Progress Notes (Signed)
Patient is alert but Lethargic at times and continues to feel poorly wife at bedside  Patient continues to have low grade fever Tylenol given for relief  Patient continues on IV Zosyn and was started on IV  NS at 65ml/hr Patient c/o abdominal and generalized pain relieved well with prn Diludid and Percocet Patient is NPO after midnight for MRCP tomorrow  Patients lantus increased to 66 units BID

## 2015-03-31 NOTE — Progress Notes (Addendum)
[place] at [facility]                                                                                                                                                                                            Patient Demographics   [name], is a 74 y.o. male, DOB - 1940-05-13, [id number]  Admit date - 03/29/2015   Admitting Physician Vaughan Basta, MD  Outpatient Primary MD for the patient is Rusty Aus., MD   LOS - 2  Subjective: Patient had fevers yesterday, he still very weak and tired    Review of Systems:   CONSTITUTIONAL:  + fever. No fatigue, weakness. No weight gain, no weight loss.  EYES: No blurry or double vision.  ENT: No tinnitus. No postnasal drip. No redness of the oropharynx.  RESPIRATORY: No cough, no wheeze, no hemoptysis. No dyspnea.  CARDIOVASCULAR: No chest pain. No orthopnea. No palpitations. No syncope.  GASTROINTESTINAL: No nausea, no vomiting or diarrhea.  Positive abdominal pain. No melena or hematochezia.  GENITOURINARY: No dysuria or hematuria.  ENDOCRINE: No polyuria or nocturia. No heat or cold intolerance.  HEMATOLOGY: No anemia. No bruising. No bleeding.  INTEGUMENTARY: No rashes. No lesions.  MUSCULOSKELETAL: No arthritis. No swelling. No gout.  NEUROLOGIC: No numbness, tingling, or ataxia. No seizure-type activity.  PSYCHIATRIC: No anxiety. No insomnia. No ADD.    Vitals:   Filed Vitals:   03/31/15 0000 03/31/15 0522 03/31/15 0817 03/31/15 1110  BP:  120/55 126/65   Pulse:  94 99   Temp: 100.6 F (38.1 C) 99.3 F (37.4 C) 99.3 F (37.4 C) 99.6 F (37.6 C)  TempSrc:  Oral Oral   Resp:  18    Height:      Weight:      SpO2:  97% 98%     Wt Readings from Last 3 Encounters:  03/29/15 94.2 kg (207 lb 10.8 oz)  03/23/15 92.4 kg (203 lb 11.3 oz)  03/09/15 93.35 kg (205 lb 12.8 oz)     Intake/Output Summary (Last 24 hours) at 03/31/15 1224 Last data filed at 03/31/15 0800   Gross per 24 hour  Intake    720 ml  Output      0 ml  Net    720 ml    Physical Exam:   GENERAL: Pleasant-appearing in no apparent distress. Chronically ill-appearing male HEAD, EYES, EARS, NOSE AND THROAT: Atraumatic, normocephalic. Extraocular muscles are intact. Pupils equal and reactive to light. Sclerae anicteric. No conjunctival injection. No oro-pharyngeal erythema.  NECK: Supple. There is no jugular venous distention. No bruits, no lymphadenopathy, no thyromegaly.  HEART: Regular rate and rhythm,. No murmurs, no rubs, no clicks.  LUNGS: Clear to auscultation bilaterally. No rales or rhonchi. No wheezes.  ABDOMEN: Soft, flat, mild epigastric tenderness, nondistended. Has good bowel sounds. No hepatosplenomegaly appreciated.  EXTREMITIES: No evidence of any cyanosis, clubbing, or peripheral edema.  +2 pedal and radial pulses bilaterally.  NEUROLOGIC: The patient is alert, awake, and oriented x3 with no focal motor or sensory deficits appreciated bilaterally.  SKIN: Moist and warm with no rashes appreciated.  Psych: Not anxious, depressed LN: No inguinal LN enlargement    Antibiotics   Anti-infectives    Start     Dose/Rate Route Frequency Ordered Stop   03/29/15 2200  vancomycin (VANCOCIN) IVPB 1000 mg/200 mL premix  Status:  Discontinued     1,000 mg 200 mL/hr over 60 Minutes Intravenous Every 18 hours 03/29/15 1324 03/30/15 1518   03/29/15 1900  piperacillin-tazobactam (ZOSYN) IVPB 3.375 g     3.375 g 12.5 mL/hr over 240 Minutes Intravenous Every 8 hours 03/29/15 1324     03/29/15 1000  piperacillin-tazobactam (ZOSYN) IVPB 3.375 g     3.375 g 100 mL/hr over 30 Minutes Intravenous  Once 03/29/15 0949 03/29/15 1340   03/29/15 1000  vancomycin (VANCOCIN) 1,500 mg in sodium chloride 0.9 % 500 mL IVPB     1,500 mg 250 mL/hr over 120 Minutes Intravenous  Once 03/29/15 0949 03/29/15 1238      Medications   Scheduled Meds: . aspirin EC  81 mg Oral Daily  . gabapentin  300  mg Oral BID  . heparin  5,000 Units Subcutaneous 3 times per day  . insulin aspart  0-9 Units Subcutaneous TID AC & HS  . insulin glargine  66 Units Subcutaneous BID  . meloxicam  7.5 mg Oral Daily  . pantoprazole  40 mg Oral QAC breakfast  . piperacillin-tazobactam (ZOSYN)  IV  3.375 g Intravenous Q8H  . pravastatin  20 mg Oral q1800  . torsemide  40 mg Oral BID   Continuous Infusions: . sodium chloride 75 mL/hr at 03/31/15 1156   PRN Meds:.acetaminophen, HYDROmorphone (DILAUDID) injection, ipratropium-albuterol, ondansetron, oxyCODONE-acetaminophen, polyethylene glycol powder   Data Review:   Micro Results Recent Results (from the past 240 hour(s))  Urine culture     Status: None   Collection Time: 03/29/15  9:55 AM  Result Value Ref Range Status   Specimen Description URINE, RANDOM  Final   Special Requests NONE  Final   Culture 1,000 COLONIES/mL INSIGNIFICANT GROWTH   Final   Report Status 03/30/2015 FINAL  Final  Blood Culture (routine x 2)     Status: None (Preliminary result)   Collection Time: 03/29/15 10:00 AM  Result Value Ref Range Status   Specimen Description BLOOD LEFT HAND  Final   Special Requests BOTTLES DRAWN AEROBIC AND ANAEROBIC  11 CC  Final   Culture NO GROWTH 2 DAYS  Final   Report Status PENDING  Incomplete  Blood Culture (routine x 2)     Status: None (Preliminary result)   Collection Time: 03/29/15 10:08 AM  Result Value Ref Range Status   Specimen Description BLOOD LEFT ARM  Final   Special Requests   Final    BOTTLES DRAWN AEROBIC AND ANAEROBIC  1CC ANAERO 3CC AERO   Culture NO GROWTH 2 DAYS  Final   Report Status PENDING  Incomplete    Radiology Reports Ct Abdomen Pelvis W Contrast  03/29/2015  CLINICAL DATA:  74 year old male diagnosed with pancreatic moderately  differentiated adenocarcinoma last month via endoscopic ultrasound biopsy. status post chemotherapy, radiation, stent placement. Abdominal pain and distension with fever for 2  days. Subsequent encounter. EXAM: CT ABDOMEN AND PELVIS WITH CONTRAST TECHNIQUE: Multidetector CT imaging of the abdomen and pelvis was performed using the standard protocol following bolus administration of intravenous contrast. CONTRAST:  69mL OMNIPAQUE IOHEXOL 300 MG/ML  SOLN COMPARISON:  CT Abdomen and Pelvis 02/14/2015 and earlier. FINDINGS: Stable cardiomegaly. No pericardial effusion. Small layering left pleural effusion is stable. Atelectasis at the lung bases. Previous sternotomy. No acute or metastatic osseous lesion identified. Stable small fat containing left inguinal hernia. No pelvic free fluid. Negative urinary bladder. Retained stool throughout the distal colon. Redundant sigmoid with some gaseous distension. Retained stool throughout the left colon, and more proximal colon. Redundant right colon. Normal appendix. Negative terminal ileum. No dilated small bowel. Small sliding-type gastric hiatal hernia. Otherwise negative stomach. CBD stent in place. Mild gaseous distension of the duodenum which otherwise appears normal. Pneumobilia has not significantly changed. Pancreatic ductal dilatation has not significantly changed. Hypodense lesions at the pancreatic head and neck appears stable (series 2, images 31 and 32). Stable peripancreatic lymph node measuring 11 mm short axis situated between the IVC and main portal vein. Other porta hepatis nodes are stable measuring up to 12 mm short axis. Liver enhancement and gallbladder are within normal limits (small volume of pneumobilia related gas in the gallbladder). Spleen and adrenal glands within normal limits. Portal venous system is patent. Aortoiliac calcified atherosclerosis noted. Major arterial structures are patent. No other abdominal or pelvic lymphadenopathy bilateral initial phase renal enhancement is stable and within normal limits. On delayed images there is decreased left renal contrast excretion. The right kidney appears stable. No abdominal  free fluid. IMPRESSION: 1. Largely stable CT appearance of the abdomen since October. CBD stent in place. Hypodense pancreatic head and neck lesions with surrounding 11-12 mm short axis lymph nodes compatible with pancreatic adenocarcinoma. No distant metastatic disease identified in the abdomen or pelvis. 2. Lack of left renal contrast excretion on delayed images today suggesting a degree of left renal insufficiency. Electronically Signed   By: Genevie Ann M.D.   On: 03/29/2015 12:27   Dg Chest Port 1 View  03/29/2015  CLINICAL DATA:  Fever.  Pancreatic cancer. EXAM: PORTABLE CHEST 1 VIEW COMPARISON:  Chest radiograph 02/25/2015.  CT chest 02/25/2015. FINDINGS: The heart is enlarged. 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 Thoracic atherosclerosis. Low lung volumes. No definite active infiltrates or failure. No osseous findings. IMPRESSION: Low lung volumes. No definite active infiltrates or failure. Cardiomegaly. Electronically Signed   By: Staci Righter M.D.   On: 03/29/2015 10:39     CBC  Recent Labs Lab 03/29/15 0955 03/30/15 0805 03/31/15 0452  WBC 14.8* 11.4* 7.9  HGB 10.3* 9.6* 9.0*  HCT 32.5* 30.7* 28.5*  PLT 187 149* 162  MCV 85.0 86.3 86.6  MCH 26.9 27.0 27.4  MCHC 31.7* 31.2* 31.6*  RDW 16.0* 16.0* 15.7*  LYMPHSABS 1.1  --   --   MONOABS 0.1*  --   --   EOSABS 0.0  --   --   BASOSABS 0.1  --   --     Chemistries   Recent Labs Lab 03/29/15 0955 03/30/15 0805 03/31/15 0452  NA 139 142 143  K 3.8 4.3 3.9  CL 109 111 111  CO2 22 23 25   GLUCOSE 206* 299* 352*  BUN 31* 32* 37*  CREATININE 1.69* 1.87* 2.16*  CALCIUM 8.3*  8.3* 8.2*  MG 1.2*  --   --   AST 35  --   --   ALT 23  --   --   ALKPHOS 92  --   --   BILITOT 1.2  --   --    ------------------------------------------------------------------------------------------------------------------ estimated creatinine clearance is 33.4 mL/min (by C-G formula based on Cr of  2.16). ------------------------------------------------------------------------------------------------------------------ No results for input(s): HGBA1C in the last 72 hours. ------------------------------------------------------------------------------------------------------------------ No results for input(s): CHOL, HDL, LDLCALC, TRIG, CHOLHDL, LDLDIRECT in the last 72 hours. ------------------------------------------------------------------------------------------------------------------ No results for input(s): TSH, T4TOTAL, T3FREE, THYROIDAB in the last 72 hours.  Invalid input(s): FREET3 ------------------------------------------------------------------------------------------------------------------ No results for input(s): VITAMINB12, FOLATE, FERRITIN, TIBC, IRON, RETICCTPCT in the last 72 hours.  Coagulation profile  Recent Labs Lab 03/29/15 0955  INR 1.27    No results for input(s): DDIMER in the last 72 hours.  Cardiac Enzymes  Recent Labs Lab 03/29/15 0955  TROPONINI 0.06*   ------------------------------------------------------------------------------------------------------------------ Invalid input(s): POCBNP    Assessment & Plan   * Sepsis Evident by fever, tachycardia, elevated white cell count, generalized weakness. So for cultures are negative. Continue broad-spectrum antibiotics CT of the abdomen pelvis no evidence of infection Appreciate ID input Vancomycin discontinued Continue Zosyn Doppler of the lower extremity pending  * Adenocarcinoma of the pancreas Follows with cancer Center, recently started on chemotherapy and radiation. Prognosis poor  * Diabetes Blood sugar elevated changed Lantus back to home dose Increased in Lantus dose Start pre-meal insulin  * History of coronary artery disease Continue statin and aspirin,   *Acute renal failure on chronic renal failure start patient on IV fluids hold Demadex on its her renal  function     Code Status Orders        Start     Ordered   03/29/15 1527  Full code   Continuous     03/29/15 1526    Advance Directive Documentation        Most Recent Value   Type of Advance Directive  Healthcare Power of Attorney, Living will   Pre-existing out of facility DNR order (yellow form or pink MOST form)     "MOST" Form in Place?             Consults  infectious disease DVT Prophylaxis  heparin  Lab Results  Component Value Date   PLT 162 03/31/2015     Time Spent in minutes 40minutes   Dustin Flock M.D on 03/31/2015 at 12:24 PM  Between 7am to 6pm - Pager - [phone]  After 6pm go to [email or web address] - password [email or web address] [facility]   Office  [phone]

## 2015-03-31 NOTE — Progress Notes (Signed)
Inpatient Diabetes Program Recommendations  AACE/ADA: New Consensus Statement on Inpatient Glycemic Control (2015)  Target Ranges:  Prepandial:   less than 140 mg/dL      Peak postprandial:   less than 180 mg/dL (1-2 hours)      Critically ill patients:  140 - 180 mg/dL   Review of Glycemic Control:  Results for [name], [name] (MRN [id number]) as of 03/31/2015 11:11  Ref. Range 03/30/2015 07:05 03/30/2015 11:11 03/30/2015 16:26 03/30/2015 21:59 03/31/2015 07:20  Glucose-Capillary Latest Ref Range: 65-99 mg/dL 277 (H) 295 (H) 303 (H) 298 (H) 292 (H)   Diabetes history: Diabetes Mellitus Outpatient Diabetes medications: Lantus 60 units bid, Novolin R- 5-15 units tid with meals Current orders for Inpatient glycemic control:  Novolog sensitive tid with meals, Lantus 60 units bid (just increased today) Inpatient Diabetes Program Recommendations:    May consider adding Novolog meal coverage 6 units tid with meals.  (hold it patient eats less than 50%).  Thanks, Adah Perl, RN, BC-ADM Inpatient Diabetes Coordinator Pager [phone] (8a-5p)

## 2015-03-31 NOTE — Consult Note (Signed)
GI Inpatient Consult Note  Reason for Consult: Fever following recent ERCP with stent   Attending Requesting Consult: Dr. Posey Pronto  History of Present Illness: [name] is a 74 y.o. male with a significant past medical history of recent pancreatic cancer diagnosis, Chronic kidney disease, stage IV, asymptomatic bilateral carotid artery stenosis, diabetes type 2, COPD, CAD  Was hospitalized on March 29, 2015 after feeling weak.  He is currently being treated with chemotherapy and radiation.  In the emergency department he was found have a fever of 102, WBC of 14.8 and was tachycardic.  He had an ERCP completed by Dr. Allen Norris  On February 13, 2015. Impression ; the major papilla it appeared to be bulging.   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  The entire main bile duct was dilated.  A sphincterotomy was performed.  One plastic stent was placed into the common bile duct.  Recommendation; repeat ERCP in 3 months to exchange the stent.  He was seen in our office on February 24, 2015 by Dr. Rayann Heman, at that time he was doing well overall.  Jaundice had nearly gone away since the ERCP.  No abdominal pain.  This information was all retrieved from medical records.   Past Medical History:  Past Medical History  Diagnosis Date  . Hyperlipidemia   . H/O cardiac catheterization 06/03/06,12/03/11  . BPH (benign prostatic hypertrophy)   . Anxiety   . Syncope     LAUGHING INDUCED  . Myocardial infarction ([place])     x 2  . CHF (congestive heart failure) ([place])   . CAD (coronary artery disease) 06/03/06    cypher 2.5 x 34mm DES for 80% mid RCA sees Dr. Ubaldo Glassing  . Asthma     as a child  . Sleep apnea     hx of  . Kidney stone     hx of  . GERD (gastroesophageal reflux disease)     on medication for  . Cancer ([place])     hx of skin ca in "corner of left eye"  . Arthritis   . Anemia     taking iron  . Cataract, bilateral   . Neuromuscular disorder ([place])     diabetic neuropathy  . Diabetes mellitus  without complication ([place])   . Pancreatic adenocarcinoma [facility])     Problem List: Patient Active Problem List   Diagnosis Date Noted  . Fever 03/29/2015  . Sepsis ([place]) 02/25/2015  . Pancreatic cancer ([place]) 02/22/2015  . Pancreas neoplasm   . Acquired hyperbilirubinemia   . Disease of biliary tract   . Obstructive hyperbilirubinemia   . Jaundice   . Obstructive jaundice 02/10/2015  . Angina at rest [facility]) 10/26/2014  . 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 Diabetes mellitus   . Hyperlipidemia   . CAD (coronary artery disease)   . Sleep apnea   . Asthma   . BPH (benign prostatic hypertrophy)   . Anxiety   . Syncope     Past Surgical History: Past Surgical History  Procedure Laterality Date  . Spine surgery  9/05    CERVICAL LAMINECTOMY  . Cervical herniated    . Eye surgery      as a child  . Coronary artery bypass graft  12/12/2011    Procedure: CORONARY ARTERY BYPASS GRAFTING (CABG);  Surgeon: Gaye Pollack, MD;  Location: [facility];  Service: Open Heart Surgery;  Laterality: N/A;  coronary artery bypass graft times four using left internal mammary artery, right  leg greater saphenous vein harvested endoscopically  . Cardiac catheterization N/A 10/26/2014    Procedure: Left Heart Cath;  Surgeon: Isaias Cowman, MD;  Location: [place] CV LAB;  Service: Cardiovascular;  Laterality: N/A;  . Endoscopic retrograde cholangiopancreatography (ercp) with propofol N/A 02/13/2015    Procedure: ENDOSCOPIC RETROGRADE CHOLANGIOPANCREATOGRAPHY (ERCP) WITH PROPOFOL;  Surgeon: Lucilla Lame, MD;  Location: [facility] ENDOSCOPY;  Service: Endoscopy;  Laterality: N/A;  . Eus N/A 02/23/2015    Procedure: UPPER ENDOSCOPIC ULTRASOUND (EUS) LINEAR;  Surgeon: Holly Bodily, MD;  Location: [facility] ENDOSCOPY;  Service: Gastroenterology;  Laterality: N/A;    Allergies: Allergies  Allergen Reactions  . Zithromax [Azithromycin] Anaphylaxis and Other (See Comments)    Reaction:  Stroke-like symptoms   .  Lisinopril Other (See Comments)    Per spouse, pt did not tolerate well in the past.      Home Medications: Prescriptions prior to admission  Medication Sig Dispense Refill Last Dose  . acetaminophen (TYLENOL) 650 MG CR tablet Take 1,300 mg by mouth every 8 (eight) hours as needed for pain.   prn at prn  . aspirin EC 81 MG tablet Take 81 mg by mouth daily.   03/28/2015 at Unknown time  . gabapentin (NEURONTIN) 300 MG capsule Take 300 mg by mouth 2 (two) times daily.   03/28/2015 at Unknown time  . HYDROcodone-acetaminophen (NORCO/VICODIN) 5-325 MG tablet Take 1 tablet by mouth every 6 (six) hours as needed. For pain.   prn at prn  . insulin glargine (LANTUS) 100 UNIT/ML injection Inject 60 Units into the skin 2 (two) times daily.    03/28/2015 at Unknown time  . insulin regular (NOVOLIN R,HUMULIN R) 100 units/mL injection Inject 5-15 Units into the skin 3 (three) times daily before meals. Pt uses as needed per sliding scale.   prn at prn  . lovastatin (MEVACOR) 20 MG tablet Take 20 mg by mouth at bedtime.   03/28/2015 at Unknown time  . meloxicam (MOBIC) 7.5 MG tablet Take 1 tablet by mouth daily.   03/28/2015 at Unknown time  . omeprazole (PRILOSEC) 20 MG capsule Take 20 mg by mouth 2 (two) times daily.   03/28/2015 at Unknown time  . ondansetron (ZOFRAN) 4 MG tablet Take 4 mg by mouth every 8 (eight) hours as needed for nausea or vomiting.   prn at prn  . polyethylene glycol powder (GLYCOLAX/MIRALAX) powder Take 17 g by mouth daily as needed. Constipation.   prn at prn  . torsemide (DEMADEX) 20 MG tablet Take 40 mg by mouth 2 (two) times daily.   03/28/2015 at Unknown time  . cephALEXin (KEFLEX) 500 MG capsule Take 1 capsule (500 mg total) by mouth 3 (three) times daily. 30 capsule 0 Taking   Home medication reconciliation was completed with the patient.   Scheduled Inpatient Medications:   . aspirin EC  81 mg Oral Daily  . gabapentin  300 mg Oral BID  . heparin  5,000 Units Subcutaneous 3 times  per day  . insulin aspart  0-9 Units Subcutaneous TID AC & HS  . insulin aspart  4 Units Subcutaneous TID WC  . insulin glargine  66 Units Subcutaneous BID  . meloxicam  7.5 mg Oral Daily  . pantoprazole  40 mg Oral QAC breakfast  . piperacillin-tazobactam (ZOSYN)  IV  3.375 g Intravenous Q8H  . pravastatin  20 mg Oral q1800    Continuous Inpatient Infusions:   . sodium chloride 75 mL/hr at 03/31/15 1156  PRN Inpatient Medications:  acetaminophen, HYDROmorphone (DILAUDID) injection, ipratropium-albuterol, ondansetron, oxyCODONE-acetaminophen, polyethylene glycol powder  Family History: family history includes Diabetes Mellitus II in his brother, mother, and sister; Leukemia in his father; Pancreatic cancer in his mother.    Social History:   reports that he quit smoking about 37 years ago. His smoking use included Cigarettes and Cigars. He has never used smokeless tobacco. He reports that he does not drink alcohol or use illicit drugs.   Review of Systems: did not assess   Physical Examination: BP 117/73 mmHg  Pulse 96  Temp(Src) 98.7 F (37.1 C) (Oral)  Resp 18  Ht 5\' 8"  (1.727 m)  Wt 94.2 kg (207 lb 10.8 oz)  BMI 31.58 kg/m2  SpO2 93% Did not assess  Data: Lab Results  Component Value Date   WBC 7.9 03/31/2015   HGB 9.0* 03/31/2015   HCT 28.5* 03/31/2015   MCV 86.6 03/31/2015   PLT 162 03/31/2015    Recent Labs Lab 03/29/15 0955 03/30/15 0805 03/31/15 0452  HGB 10.3* 9.6* 9.0*   Lab Results  Component Value Date   NA 143 03/31/2015   K 3.9 03/31/2015   CL 111 03/31/2015   CO2 25 03/31/2015   BUN 37* 03/31/2015   CREATININE 2.16* 03/31/2015   Lab Results  Component Value Date   ALT 23 03/29/2015   AST 35 03/29/2015   ALKPHOS 92 03/29/2015   BILITOT 1.2 03/29/2015    Recent Labs Lab 03/29/15 0955  APTT 35  INR 1.27    Imaging: CLINICAL DATA: 74 year old male diagnosed with pancreatic moderately differentiated adenocarcinoma last  month via endoscopic ultrasound biopsy. status post chemotherapy, radiation, stent placement. Abdominal pain and distension with fever for 2 days. Subsequent encounter.  EXAM: CT ABDOMEN AND PELVIS WITH CONTRAST  TECHNIQUE: Multidetector CT imaging of the abdomen and pelvis was performed using the standard protocol following bolus administration of intravenous contrast.  CONTRAST: 65mL OMNIPAQUE IOHEXOL 300 MG/ML SOLN  COMPARISON: CT Abdomen and Pelvis 02/14/2015 and earlier.  FINDINGS: Stable cardiomegaly. No pericardial effusion. Small layering left pleural effusion is stable. Atelectasis at the lung bases.  Previous sternotomy. No acute or metastatic osseous lesion identified.  Stable small fat containing left inguinal hernia. No pelvic free fluid. Negative urinary bladder. Retained stool throughout the distal colon. Redundant sigmoid with some gaseous distension. Retained stool throughout the left colon, and more proximal colon. Redundant right colon. Normal appendix. Negative terminal ileum. No dilated small bowel. Small sliding-type gastric hiatal hernia. Otherwise negative stomach.  CBD stent in place. Mild gaseous distension of the duodenum which otherwise appears normal. Pneumobilia has not significantly changed. Pancreatic ductal dilatation has not significantly changed. Hypodense lesions at the pancreatic head and neck appears stable (series 2, images 31 and 32). Stable peripancreatic lymph node measuring 11 mm short axis situated between the IVC and main portal vein. Other porta hepatis nodes are stable measuring up to 12 mm short axis.  Liver enhancement and gallbladder are within normal limits (small volume of pneumobilia related gas in the gallbladder). Spleen and adrenal glands within normal limits. Portal venous system is patent. Aortoiliac calcified atherosclerosis noted. Major arterial structures are patent. No other abdominal or pelvic  lymphadenopathy bilateral initial phase renal enhancement is stable and within normal limits. On delayed images there is decreased left renal contrast excretion. The right kidney appears stable. No abdominal free fluid.  IMPRESSION: 1. Largely stable CT appearance of the abdomen since October. CBD stent in place. Hypodense pancreatic head  and neck lesions with surrounding 11-12 mm short axis lymph nodes compatible with pancreatic adenocarcinoma. No distant metastatic disease identified in the abdomen or pelvis. 2. Lack of left renal contrast excretion on delayed images today suggesting a degree of left renal insufficiency.   Electronically Signed  By: Genevie Ann M.D.  On: 03/29/2015 12:27  Assessment/Plan: Mr. Berquist is a 74 y.o. male with recent diagnosis of pancreatic cancer, on chemotherapy and radiation.  WBC on admission 14.8, currently 7.9. LFT's wnl. Albumin 2.8 Patient is currently at radiology for MRCP.  Recommendations: Patient has been discussed with Dr. Rayann Heman and he will perform physical exam and ROS.  Given normal LFT's, unlikely that fever is related to biliary stent. Please see his full note for any further recommendations. Thank you for the consult. Please call with questions or concerns.  Salvadore Farber, PA-C  I personally performed these services.

## 2015-03-31 NOTE — Progress Notes (Signed)
[name] INFECTIOUS DISEASE PROGRESS NOTE Date of Admission:  03/29/2015     ID: [name] is a 74 y.o. male with pancreatic cancer and sepsis  Active Problems:   Sepsis ([place])   Fever  Subjective: Still feverish, very weak, can barely sit at side of bed.  Some LLE pain and swelling.   ROS  Eleven systems are reviewed and negative except per hpi  Medications:  Antibiotics Given (last 72 hours)    Date/Time Action Medication Dose Rate   03/29/15 2008 Given   piperacillin-tazobactam (ZOSYN) IVPB 3.375 g 3.375 g 12.5 mL/hr   03/29/15 2237 Given   vancomycin (VANCOCIN) IVPB 1000 mg/200 mL premix 1,000 mg 200 mL/hr   03/30/15 0417 Given   piperacillin-tazobactam (ZOSYN) IVPB 3.375 g 3.375 g 12.5 mL/hr   03/30/15 1045 Given   piperacillin-tazobactam (ZOSYN) IVPB 3.375 g 3.375 g 12.5 mL/hr   03/30/15 1824 Given   piperacillin-tazobactam (ZOSYN) IVPB 3.375 g 3.375 g 12.5 mL/hr   03/31/15 0338 Given   piperacillin-tazobactam (ZOSYN) IVPB 3.375 g 3.375 g 12.5 mL/hr   03/31/15 1152 Given   piperacillin-tazobactam (ZOSYN) IVPB 3.375 g 3.375 g 12.5 mL/hr     . aspirin EC  81 mg Oral Daily  . gabapentin  300 mg Oral BID  . heparin  5,000 Units Subcutaneous 3 times per day  . insulin aspart  0-9 Units Subcutaneous TID AC & HS  . insulin aspart  4 Units Subcutaneous TID WC  . insulin glargine  66 Units Subcutaneous BID  . meloxicam  7.5 mg Oral Daily  . pantoprazole  40 mg Oral QAC breakfast  . piperacillin-tazobactam (ZOSYN)  IV  3.375 g Intravenous Q8H  . pravastatin  20 mg Oral q1800    Objective: Vital signs in last 24 hours: Temp:  [98.2 F (36.8 C)-101.4 F (38.6 C)] 99.6 F (37.6 C) (12/09 1110) Pulse Rate:  [87-102] 99 (12/09 0817) Resp:  [18-20] 18 (12/09 0522) BP: (120-147)/(49-68) 126/65 mmHg (12/09 0817) SpO2:  [95 %-98 %] 98 % (12/09 0817) Constitutional: He is oriented to person, place, and time. Obese HENT: [place], eomi, anicteric,  Mouth/Throat:  Oropharynx is clear and dry . No oropharyngeal exudate.  Cardiovascular: Normal rate, regular rhythm and normal heart sounds.  Pulmonary/Chest: Effort normal and breath sounds normal. No respiratory distress. He has no wheezes.  Abdominal: Soft. Bowel sounds are normal. He exhibits no distension. Mild ttp RUQ Lymphadenopathy: He has no cervical adenopathy.  Neurological: He is alert and oriented to person, place, and time. Diffusely weak but nonfocal exam Skin: Skin is warm and dry. No rash noted. No erythema.  Psychiatric: He has a normal mood and affect. His behavior is normal.  Ext LLE with 1+ edema  Lab Results  Recent Labs  03/30/15 0805 03/31/15 0452  WBC 11.4* 7.9  HGB 9.6* 9.0*  HCT 30.7* 28.5*  NA 142 143  K 4.3 3.9  CL 111 111  CO2 23 25  BUN 32* 37*  CREATININE 1.87* 2.16*    Microbiology: Results for orders placed or performed during the hospital encounter of 03/29/15  Urine culture     Status: None   Collection Time: 03/29/15  9:55 AM  Result Value Ref Range Status   Specimen Description URINE, RANDOM  Final   Special Requests NONE  Final   Culture 1,000 COLONIES/mL INSIGNIFICANT GROWTH   Final   Report Status 03/30/2015 FINAL  Final  Blood Culture (routine x 2)     Status:  None (Preliminary result)   Collection Time: 03/29/15 10:00 AM  Result Value Ref Range Status   Specimen Description BLOOD LEFT HAND  Final   Special Requests BOTTLES DRAWN AEROBIC AND ANAEROBIC  11 CC  Final   Culture NO GROWTH 2 DAYS  Final   Report Status PENDING  Incomplete  Blood Culture (routine x 2)     Status: None (Preliminary result)   Collection Time: 03/29/15 10:08 AM  Result Value Ref Range Status   Specimen Description BLOOD LEFT ARM  Final   Special Requests   Final    BOTTLES DRAWN AEROBIC AND ANAEROBIC  1CC ANAERO 3CC AERO   Culture NO GROWTH 2 DAYS  Final   Report Status PENDING  Incomplete    Studies/Results: Ct Abdomen Pelvis W Contrast  03/29/2015   CLINICAL DATA:  74 year old male diagnosed with pancreatic moderately differentiated adenocarcinoma last month via endoscopic ultrasound biopsy. status post chemotherapy, radiation, stent placement. Abdominal pain and distension with fever for 2 days. Subsequent encounter. EXAM: CT ABDOMEN AND PELVIS WITH CONTRAST TECHNIQUE: Multidetector CT imaging of the abdomen and pelvis was performed using the standard protocol following bolus administration of intravenous contrast. CONTRAST:  30mL OMNIPAQUE IOHEXOL 300 MG/ML  SOLN COMPARISON:  CT Abdomen and Pelvis 02/14/2015 and earlier. FINDINGS: Stable cardiomegaly. No pericardial effusion. Small layering left pleural effusion is stable. Atelectasis at the lung bases. Previous sternotomy. No acute or metastatic osseous lesion identified. Stable small fat containing left inguinal hernia. No pelvic free fluid. Negative urinary bladder. Retained stool throughout the distal colon. Redundant sigmoid with some gaseous distension. Retained stool throughout the left colon, and more proximal colon. Redundant right colon. Normal appendix. Negative terminal ileum. No dilated small bowel. Small sliding-type gastric hiatal hernia. Otherwise negative stomach. CBD stent in place. Mild gaseous distension of the duodenum which otherwise appears normal. Pneumobilia has not significantly changed. Pancreatic ductal dilatation has not significantly changed. Hypodense lesions at the pancreatic head and neck appears stable (series 2, images 31 and 32). Stable peripancreatic lymph node measuring 11 mm short axis situated between the IVC and main portal vein. Other porta hepatis nodes are stable measuring up to 12 mm short axis. Liver enhancement and gallbladder are within normal limits (small volume of pneumobilia related gas in the gallbladder). Spleen and adrenal glands within normal limits. Portal venous system is patent. Aortoiliac calcified atherosclerosis noted. Major arterial structures are  patent. No other abdominal or pelvic lymphadenopathy bilateral initial phase renal enhancement is stable and within normal limits. On delayed images there is decreased left renal contrast excretion. The right kidney appears stable. No abdominal free fluid. IMPRESSION: 1. Largely stable CT appearance of the abdomen since October. CBD stent in place. Hypodense pancreatic head and neck lesions with surrounding 11-12 mm short axis lymph nodes compatible with pancreatic adenocarcinoma. No distant metastatic disease identified in the abdomen or pelvis. 2. Lack of left renal contrast excretion on delayed images today suggesting a degree of left renal insufficiency. Electronically Signed   By: Genevie Ann M.D.   On: 03/29/2015 12:27   Dg Chest Port 1 View  03/29/2015  CLINICAL DATA:  Fever.  Pancreatic cancer. EXAM: PORTABLE CHEST 1 VIEW COMPARISON:  Chest radiograph 02/25/2015.  CT chest 02/25/2015. FINDINGS: The heart is enlarged. 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 Thoracic atherosclerosis. Low lung volumes. No definite active infiltrates or failure. No osseous findings. IMPRESSION: Low lung volumes. No definite active infiltrates or failure. Cardiomegaly. Electronically Signed   By: Roderic Ovens.D.  On: 03/29/2015 10:39   Assessment/Plan: GERELL DIMITRI is a 74 y.o. male with recently dxed pancreatic cancer s/p recent EUS and ERCP admitted Nov 5-9 with with sepsis and found to have Klebsiella Pna bacteremia. He was discharged on oral keflex for a 10 day course with thought that the likely source is his GI tract given pancreatic cancer and prior procedures. Readmitted 12/7 with Fever and weakness. He had recently started chemo with gemcitabine. On admit temp 102. Wbc 14.8. Has been started on vanco and zosyn. Ct scan abd pelvis shows no major change, no obstruction but does have continued pneumobilia . CXR negative. LFTs nml. UA neg. Not neutropenic.  No obvious source of fever, leukocytosis. He also had diffuse  weakness but no focal findings. I suspect he has a biliary source again given stent in place, pneumobilia.   Recommendations Continue zosyn pending culture results. Vanco dced 12/8.  If decompensates would add vanco back to cover. Would dw GI need for further biliary evaluation May need to treat empirically for biliary source of infection for a more prolonged period if work up negative Thank you very much for allowing me to participate in the care of this patient. Please call with questions.   [place], [place]   03/31/2015, 1:33 PM

## 2015-03-31 NOTE — Plan of Care (Signed)
Problem: Safety: Goal: Ability to remain free from injury will improve Outcome: Progressing On high fall precaution per policy.  Problem: Pain Managment: Goal: General experience of comfort will improve Outcome: Progressing Pain control via PRN medications with improvement.  Problem: Physical Regulation: Goal: Will remain free from infection Continues ABX.

## 2015-03-31 NOTE — Care Management Important Message (Signed)
Important Message  Patient Details  Name: [name] MRN: [id number] Date of Birth: 01-Sep-1940   Medicare Important Message Given:  Yes    Juliann Pulse A Valina Maes 03/31/2015, 1:08 PM

## 2015-03-31 NOTE — Progress Notes (Signed)
[name]   DOB:[birth year]   [id number]    Subjective: continued fevers low-grade. Overall continues to feel poorly. Poor appetite. As per the wife patient is "shaky".   Review of system: mild shortness of breath no cough. Swelling in the legs.  Objective:  Filed Vitals:   03/31/15 1534 03/31/15 1646  BP:    Pulse:    Temp: 100.6 F (38.1 C) 100.1 F (37.8 C)  Resp:       Intake/Output Summary (Last 24 hours) at 03/31/15 1737 Last data filed at 03/31/15 1200  Gross per 24 hour  Intake    720 ml  Output      0 ml  Net    720 ml    GENERAL:  Alert, no acute distress. However appears ill. Accompanied by wife. EYES: No icterus OROPHARYNX: no thrush or ulceration; poor dentition NECK: supple, no masses felt LUNGS:decreased breath sounds bilaterally at the bases. No wheeze or crackles HEART/CVS: regular rate & rhythm and no murmurs; 1+ bilateral lower extremity edema ABDOMEN: abdomen soft, non-tender and normal bowel sounds Musculoskeletal:no cyanosis of digits and no clubbing  PSYCH: alert & oriented x 3 with flat affect/appears depressed NEURO: no focal motor/sensory deficits; generalized weakness present.he has coarse tremors bilateral upper extremities.  SKIN: no rashes or significant lesions  Labs:  Lab Results  Component Value Date   WBC 7.9 03/31/2015   HGB 9.0* 03/31/2015   HCT 28.5* 03/31/2015   MCV 86.6 03/31/2015   PLT 162 03/31/2015   NEUTROABS 13.5* 03/29/2015    Lab Results  Component Value Date   NA 143 03/31/2015   K 3.9 03/31/2015   CL 111 03/31/2015   CO2 25 03/31/2015    Studies:  US Venous Img Lower Bilateral  03/31/2015  CLINICAL DATA:  Swelling. EXAM: BILATERAL LOWER EXTREMITY VENOUS DOPPLER ULTRASOUND TECHNIQUE: Gray-scale sonography with graded compression, as well as color Doppler and duplex ultrasound were performed to evaluate the lower extremity deep venous systems from the level of the common femoral vein and including the  common femoral, femoral, profunda femoral, popliteal and calf veins including the posterior tibial, peroneal and gastrocnemius veins when visible. The superficial great saphenous vein was also interrogated. Spectral Doppler was utilized to evaluate flow at rest and with distal augmentation maneuvers in the common femoral, femoral and popliteal veins. COMPARISON:  None. FINDINGS: RIGHT LOWER EXTREMITY Common Femoral Vein: No evidence of thrombus. Normal compressibility, respiratory phasicity and response to augmentation. Saphenofemoral Junction: No evidence of thrombus. Normal compressibility and flow on color Doppler imaging. Profunda Femoral Vein: No evidence of thrombus. Normal compressibility and flow on color Doppler imaging. Femoral Vein: No evidence of thrombus. Normal compressibility, respiratory phasicity and response to augmentation. Popliteal Vein: No evidence of thrombus. Normal compressibility, respiratory phasicity and response to augmentation. Calf Veins: No evidence of thrombus. Normal compressibility and flow on color Doppler imaging. Superficial Great Saphenous Vein: No evidence of thrombus. Normal compressibility and flow on color Doppler imaging. Other Findings:  None. LEFT LOWER EXTREMITY Common Femoral Vein: No evidence of thrombus. Normal compressibility, respiratory phasicity and response to augmentation. Saphenofemoral Junction: No evidence of thrombus. Normal compressibility and flow on color Doppler imaging. Profunda Femoral Vein: No evidence of thrombus. Normal compressibility and flow on color Doppler imaging. Femoral Vein: No evidence of thrombus. Normal compressibility, respiratory phasicity and response to augmentation. Popliteal Vein: No evidence of thrombus. Normal compressibility, respiratory phasicity and response to augmentation. Calf Veins: No evidence of thrombus. Normal compressibility and flow  on color Doppler imaging. Superficial Great Saphenous Vein: No evidence of thrombus.  Normal compressibility and flow on color Doppler imaging. Other Findings:  None. IMPRESSION: No evidence of deep venous thrombosis. Electronically Signed   By: Marcello Moores  Register   On: 03/31/2015 14:20    Assessment & Plan:   74 year old male patient with a history of stage II pancreatic cancer currently status post cycle #1 chemotherapy and radiation admitted to the hospital for high-grade fevers  # fever-unclear etiology; patient on IV antibiotics. Infectious disease is following.Blood cultures pending. awaitt GI evaluation.  # Pancreatic cancer-stage II B- no obvious progression noted on the CT scan done in the emergency room.I discussed with the patient's wife regarding overall poor prognosis given the multiple admissions in the hospital/declining performance status. If he continues to decline overall-  Offering chemotherapy or radiation would be a  Challenge. I reviewed this with the patient's daughter Suanne Marker over the phone. I also discussed about CODE STATUS-however no decisions made.  All questions were answered. The above plan of care was discussed with the patient and his wife in detail.  Cammie Sickle, MD 03/31/2015  5:37 PM

## 2015-03-31 NOTE — Evaluation (Signed)
Physical Therapy Evaluation Patient Details Name: [name] MRN: [id number] DOB: June 22, 1940 Today's Date: 03/31/2015   History of Present Illness  Patient is a 74 y/o male with recent diagnosis of pancreatic cancer. Patient admitted with fevers and difficulty ambulating for several days.   Clinical Impression  Patient has typically been quite independent at baseline, however a significant decline in mobility has been demonstrated recently. Patient is unable to provide any effort with standing and has mild R sided lean intermittently with sitting as well as generalized shakiness secondary to weakness. Of note patient complains of L sided acute onset calf pain. Swelling noted, along with generalized warmth (bilaterally), PT alerted RN and MD of concern for DVT. Given his sharp decline in mobility patient would benefit from skilled rehab to increase his independence with mobility.     Follow Up Recommendations SNF    Equipment Recommendations       Recommendations for Other Services       Precautions / Restrictions Precautions Precautions: Fall Restrictions Weight Bearing Restrictions: No      Mobility  Bed Mobility Overal bed mobility: Needs Assistance Bed Mobility: Supine to Sit;Sit to Supine     Supine to sit: Min assist Sit to supine: Min assist   General bed mobility comments: Patient demonstrates decreased ability to bring LEs on and off the edge of the bed secondary to weakness.  Transfers                 General transfer comment: Attempted sit to stand transfer, unable to lift himself off of the bed with max A x1 whatsoever.   Ambulation/Gait                Stairs            Wheelchair Mobility    Modified Rankin (Stroke Patients Only)       Balance Overall balance assessment: Needs assistance Sitting-balance support: Bilateral upper extremity supported Sitting balance-Leahy Scale: Fair Sitting balance - Comments: Pt requires  single handhold assist to support himself in sitting upright. Generalized shakes noted as well with mild leaning to his R.  Postural control: Right lateral lean                                   Pertinent Vitals/Pain Pain Assessment: Faces Faces Pain Scale: Hurts little more Pain Location: L calf  Pain Descriptors / Indicators: Sharp Pain Intervention(s): Limited activity within patient's tolerance;Monitored during session (Informed MD and RN of new onset calf pain and heat/swelling)    Home Living Family/patient expects to be discharged to:: Private residence Living Arrangements: Spouse/significant other Available Help at Discharge: Friend(s);Family;Available PRN/intermittently Type of Home: House Home Access: Level entry     Home Layout: One level Home Equipment: Walker - 2 wheels;Cane - single point;Bedside commode Additional Comments: Patient's wife is present but unable to provide physical support if patient is unable to ambulate independently.     Prior Function Level of Independence: Independent with assistive device(s)         Comments: Patient had been ambulatory without a device, however for several days recently he has not been able to stand or ambulate.      Hand Dominance        Extremity/Trunk Assessment   Upper Extremity Assessment: Overall WFL for tasks assessed           Lower Extremity Assessment: Generalized weakness (L  knee extensor noticeably weaker than RLE, difficult to test otherwise due to decrease sitting balance.)         Communication   Communication: No difficulties  Cognition Arousal/Alertness: Awake/alert Behavior During Therapy: WFL for tasks assessed/performed Overall Cognitive Status: Within Functional Limits for tasks assessed                      General Comments General comments (skin integrity, edema, etc.): Warmth, mild discoloration, swelling noted in LLE. Alerted MD and RN.     Exercises         Assessment/Plan    PT Assessment Patient needs continued PT services  PT Diagnosis Difficulty walking;Generalized weakness;Acute pain   PT Problem List Decreased strength;Decreased mobility;Decreased activity tolerance;Cardiopulmonary status limiting activity;Decreased balance;Decreased knowledge of use of DME;Pain  PT Treatment Interventions DME instruction;Therapeutic activities;Gait training;Therapeutic exercise;Balance training   PT Goals (Current goals can be found in the Care Plan section) Acute Rehab PT Goals Patient Stated Goal: To return home and get stronger.  PT Goal Formulation: With patient/family Time For Goal Achievement: 04/14/15 Potential to Achieve Goals: Fair    Frequency Min 2X/week   Barriers to discharge Decreased caregiver support Patient's wife is unable to assist with max A lifting.     Co-evaluation               End of Session Equipment Utilized During Treatment: Gait belt Activity Tolerance: Patient limited by pain;Treatment limited secondary to medical complications (Comment) (Clinical indications patient may have DVT.) Patient left: in bed;with bed alarm set;with call bell/phone within reach;with family/visitor present Nurse Communication: Mobility status (Concern regarding DVT)         Time: [id number] PT Time Calculation (min) (ACUTE ONLY): 18 min   Charges:   PT Evaluation $Initial PT Evaluation Tier I: 1 Procedure     PT G Codes:       Kerman Passey, PT, DPT    03/31/2015, 1:08 PM

## 2015-03-31 NOTE — Plan of Care (Signed)
Problem: Education: Goal: Knowledge of [facility] Education information/materials will improve Outcome: Progressing Gave family info read on MRCP    Problem: Safety: Goal: Ability to remain free from injury will improve Outcome: Progressing Patient had no falls this shift wife at bedside  Alarms in place and active, patient wife calls for assistance   Problem: Pain Managment: Goal: General experience of comfort will improve Outcome: Progressing Pain controled well with prn Diludid   Problem: Physical Regulation: Goal: Ability to maintain clinical measurements within normal limits will improve Outcome: Progressing Patient still febrile but fever relieved with prn Tylenol, MD aware   Problem: Skin Integrity: Goal: Risk for impaired skin integrity will decrease Outcome: Progressing Q2 turns in place foam pad on sacrum   Problem: Fluid Volume: Goal: Ability to maintain a balanced intake and output will improve Outcome: Progressing Patient stated on NS at 75 ml per hour this shift NPO after midnight tonight for MRCP   Problem: Bowel/Gastric: Goal: Will not experience complications related to bowel motility Outcome: Progressing Patient is incontinent of bowel and bladder

## 2015-03-31 NOTE — Consult Note (Signed)
ANTIBIOTIC CONSULT NOTE - INITIAL  Pharmacy Consult for Zosyn Indication: rule out sepsis  Allergies  Allergen Reactions  . Zithromax [Azithromycin] Anaphylaxis and Other (See Comments)    Reaction:  Stroke-like symptoms   . Lisinopril Other (See Comments)    Per spouse, pt did not tolerate well in the past.      Patient Measurements: Height: 5\' 8"  (172.7 cm) Weight: 207 lb 10.8 oz (94.2 kg) IBW/kg (Calculated) : 68.4 Adjusted Body Weight: 78.7 kg  Vital Signs: Temp: 99.6 F (37.6 C) (12/09 1110) Temp Source: Oral (12/09 0817) BP: 126/65 mmHg (12/09 0817) Pulse Rate: 99 (12/09 0817) Intake/Output from previous day: 12/08 0701 - 12/09 0700 In: 720 [P.O.:720] Out: -  Intake/Output from this shift: Total I/O In: 240 [P.O.:240] Out: -   Labs:  Recent Labs  03/29/15 0955 03/30/15 0805 03/31/15 0452  WBC 14.8* 11.4* 7.9  HGB 10.3* 9.6* 9.0*  PLT 187 149* 162  CREATININE 1.69* 1.87* 2.16*   Estimated Creatinine Clearance: 33.4 mL/min (by C-G formula based on Cr of 2.16). Ke: 0.04, Vd: 55.1 L, t-1/2: 17.33 hrs.   Microbiology: Recent Results (from the past 720 hour(s))  Urine culture     Status: None   Collection Time: 03/29/15  9:55 AM  Result Value Ref Range Status   Specimen Description URINE, RANDOM  Final   Special Requests NONE  Final   Culture 1,000 COLONIES/mL INSIGNIFICANT GROWTH   Final   Report Status 03/30/2015 FINAL  Final  Blood Culture (routine x 2)     Status: None (Preliminary result)   Collection Time: 03/29/15 10:00 AM  Result Value Ref Range Status   Specimen Description BLOOD LEFT HAND  Final   Special Requests BOTTLES DRAWN AEROBIC AND ANAEROBIC  11 CC  Final   Culture NO GROWTH 2 DAYS  Final   Report Status PENDING  Incomplete  Blood Culture (routine x 2)     Status: None (Preliminary result)   Collection Time: 03/29/15 10:08 AM  Result Value Ref Range Status   Specimen Description BLOOD LEFT ARM  Final   Special Requests   Final     BOTTLES DRAWN AEROBIC AND ANAEROBIC  1CC ANAERO 3CC AERO   Culture NO GROWTH 2 DAYS  Final   Report Status PENDING  Incomplete    Medical History: Past Medical History  Diagnosis Date  . Hyperlipidemia   . H/O cardiac catheterization 06/03/06,12/03/11  . BPH (benign prostatic hypertrophy)   . Anxiety   . Syncope     LAUGHING INDUCED  . Myocardial infarction ([place])     x 2  . CHF (congestive heart failure) ([place])   . CAD (coronary artery disease) 06/03/06    cypher 2.5 x 10mm DES for 80% mid RCA sees Dr. Ubaldo Glassing  . Asthma     as a child  . Sleep apnea     hx of  . Kidney stone     hx of  . GERD (gastroesophageal reflux disease)     on medication for  . 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 Arthritis   . Anemia     taking iron  . Cataract, bilateral   . Neuromuscular disorder ([place])     diabetic neuropathy  . Diabetes mellitus without complication ([place])   . Pancreatic adenocarcinoma (H[facility])     Medications:  Scheduled:  . aspirin EC  81 mg Oral Daily  . gabapentin  300  mg Oral BID  . heparin  5,000 Units Subcutaneous 3 times per day  . insulin aspart  0-9 Units Subcutaneous TID AC & HS  . insulin aspart  4 Units Subcutaneous TID WC  . insulin glargine  66 Units Subcutaneous BID  . meloxicam  7.5 mg Oral Daily  . pantoprazole  40 mg Oral QAC breakfast  . piperacillin-tazobactam (ZOSYN)  IV  3.375 g Intravenous Q8H  . pravastatin  20 mg Oral q1800   Infusions:  . sodium chloride 75 mL/hr at 03/31/15 1156   Assessment: 74 yo male ordered  Zosyn for sepsis.  Goal of Therapy:  Resolution of infection  Plan:  Continue Zosyn 3.375g EI   Follow up culture results  Ramond Dial, RPh 03/31/2015,2:08 PM

## 2015-04-01 ENCOUNTER — Inpatient Hospital Stay: Payer: Commercial Managed Care - HMO

## 2015-04-01 LAB — CBC
HEMATOCRIT: 27.7 % — AB (ref 40.0–52.0)
HEMOGLOBIN: 8.8 g/dL — AB (ref 13.0–18.0)
MCH: 27.3 pg (ref 26.0–34.0)
MCHC: 31.8 g/dL — AB (ref 32.0–36.0)
MCV: 86 fL (ref 80.0–100.0)
Platelets: 153 10*3/uL (ref 150–440)
RBC: 3.22 MIL/uL — AB (ref 4.40–5.90)
RDW: 15.4 % — ABNORMAL HIGH (ref 11.5–14.5)
WBC: 6.5 10*3/uL (ref 3.8–10.6)

## 2015-04-01 LAB — GLUCOSE, CAPILLARY
GLUCOSE-CAPILLARY: 117 mg/dL — AB (ref 65–99)
GLUCOSE-CAPILLARY: 279 mg/dL — AB (ref 65–99)
Glucose-Capillary: 151 mg/dL — ABNORMAL HIGH (ref 65–99)
Glucose-Capillary: 90 mg/dL (ref 65–99)
Glucose-Capillary: 205 mg/dL — ABNORMAL HIGH (ref 65–99)

## 2015-04-01 LAB — BASIC METABOLIC PANEL
ANION GAP: 7 (ref 5–15)
BUN: 34 mg/dL — ABNORMAL HIGH (ref 6–20)
CO2: 24 mmol/L (ref 22–32)
Calcium: 8.2 mg/dL — ABNORMAL LOW (ref 8.9–10.3)
Chloride: 113 mmol/L — ABNORMAL HIGH (ref 101–111)
Creatinine, Ser: 2.1 mg/dL — ABNORMAL HIGH (ref 0.61–1.24)
GFR calc Af Amer: 34 mL/min — ABNORMAL LOW (ref >60)
GFR, EST NON AFRICAN AMERICAN: 29 mL/min — AB (ref >60)
GLUCOSE: 206 mg/dL — AB (ref 65–99)
POTASSIUM: 3.4 mmol/L — AB (ref 3.5–5.1)
Sodium: 144 mmol/L (ref 135–145)

## 2015-04-01 MED ORDER — INSULIN GLARGINE 100 UNIT/ML ~~LOC~~ SOLN
30.0000 [IU] | Freq: Two times a day (BID) | SUBCUTANEOUS | Status: DC
  Administered 2015-04-01 – 2015-04-02 (×2): 30 [IU] via SUBCUTANEOUS
  Filled 2015-04-01 (×4): qty 0.3

## 2015-04-01 MED ORDER — VANCOMYCIN HCL IN DEXTROSE 1-5 GM/200ML-% IV SOLN: 1000.0000 mg | Freq: Once | INTRAVENOUS | Status: DC

## 2015-04-01 MED ORDER — VANCOMYCIN HCL 10 G IV SOLR
1250.0000 mg | INTRAVENOUS | Status: DC
  Administered 2015-04-01 – 2015-04-02 (×2): 1250 mg via INTRAVENOUS
  Filled 2015-04-01 (×3): qty 1250

## 2015-04-01 MED ORDER — GADOBENATE DIMEGLUMINE 529 MG/ML IV SOLN
10.0000 mL | Freq: Once | INTRAVENOUS | Status: AC | PRN
  Administered 2015-04-01: 10:00:00 9 mL via INTRAVENOUS

## 2015-04-01 MED ORDER — METHYLPREDNISOLONE SODIUM SUCC 125 MG IJ SOLR
60.0000 mg | Freq: Two times a day (BID) | INTRAMUSCULAR | Status: DC
  Administered 2015-04-01 – 2015-04-04 (×6): 60 mg via INTRAVENOUS
  Filled 2015-04-01 (×6): qty 2

## 2015-04-01 MED ORDER — METHYLPREDNISOLONE SODIUM SUCC 125 MG IJ SOLR
125.0000 mg | Freq: Once | INTRAMUSCULAR | Status: AC
  Administered 2015-04-01: 13:00:00 125 mg via INTRAVENOUS
  Filled 2015-04-01: qty 2

## 2015-04-01 MED ORDER — FUROSEMIDE 10 MG/ML IJ SOLN
20.0000 mg | Freq: Once | INTRAMUSCULAR | Status: AC
  Administered 2015-04-01: 13:00:00 20 mg via INTRAVENOUS
  Filled 2015-04-01: qty 2

## 2015-04-01 NOTE — Progress Notes (Signed)
ANTIBIOTIC CONSULT NOTE - INITIAL  Pharmacy Consult for Vancomycin Indication: rule out sepsis  Allergies  Allergen Reactions  . Zithromax [Azithromycin] Anaphylaxis and Other (See Comments)    Reaction:  Stroke-like symptoms   . Lisinopril Other (See Comments)    Per spouse, pt did not tolerate well in the past.      Patient Measurements: Height: 5\' 8"  (172.7 cm) Weight: 207 lb 10.8 oz (94.2 kg) IBW/kg (Calculated) : 68.4   Vital Signs: Temp: 99.2 F (37.3 C) (12/10 1557) Temp Source: Oral (12/10 1557) BP: 152/68 mmHg (12/10 1601) Pulse Rate: 96 (12/10 1557) Intake/Output from previous day: 12/09 0701 - 12/10 0700 In: 600 [P.O.:600] Out: -  Intake/Output from this shift: Total I/O In: 240 [P.O.:240] Out: 200 [Urine:200]  Labs:  Recent Labs  03/30/15 0805 03/31/15 0452 04/01/15 0615  WBC 11.4* 7.9 6.5  HGB 9.6* 9.0* 8.8*  PLT 149* 162 153  CREATININE 1.87* 2.16* 2.10*   Estimated Creatinine Clearance: 34.4 mL/min (by C-G formula based on Cr of 2.1). No results for input(s): VANCOTROUGH, VANCOPEAK, VANCORANDOM, GENTTROUGH, GENTPEAK, GENTRANDOM, TOBRATROUGH, TOBRAPEAK, TOBRARND, AMIKACINPEAK, AMIKACINTROU, AMIKACIN in the last 72 hours.   Microbiology: Recent Results (from the past 720 hour(s))  Urine culture     Status: None   Collection Time: 03/29/15  9:55 AM  Result Value Ref Range Status   Specimen Description URINE, RANDOM  Final   Special Requests NONE  Final   Culture 1,000 COLONIES/mL INSIGNIFICANT GROWTH   Final   Report Status 03/30/2015 FINAL  Final  Blood Culture (routine x 2)     Status: None (Preliminary result)   Collection Time: 03/29/15 10:00 AM  Result Value Ref Range Status   Specimen Description BLOOD LEFT HAND  Final   Special Requests BOTTLES DRAWN AEROBIC AND ANAEROBIC  11 CC  Final   Culture NO GROWTH 3 DAYS  Final   Report Status PENDING  Incomplete  Blood Culture (routine x 2)     Status: None (Preliminary result)   Collection Time: 03/29/15 10:08 AM  Result Value Ref Range Status   Specimen Description BLOOD LEFT ARM  Final   Special Requests   Final    BOTTLES DRAWN AEROBIC AND ANAEROBIC  1CC ANAERO 3CC AERO   Culture NO GROWTH 3 DAYS  Final   Report Status PENDING  Incomplete    Medical History: Past Medical History  Diagnosis Date  . Hyperlipidemia   . H/O cardiac catheterization 06/03/06,12/03/11  . BPH (benign prostatic hypertrophy)   . Anxiety   . Syncope     LAUGHING INDUCED  . Myocardial infarction ([place])     x 2  . CHF (congestive heart failure) ([place])   . CAD (coronary artery disease) 06/03/06    cypher 2.5 x 35mm DES for 80% mid RCA sees Dr. Ubaldo Glassing  . Asthma     as a child  . Sleep apnea     hx of  . Kidney stone     hx of  . GERD (gastroesophageal reflux disease)     on medication for  . 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 Arthritis   . Anemia     taking iron  . Cataract, bilateral   . Neuromuscular disorder ([place])     diabetic neuropathy  . Diabetes mellitus without complication ([place])   . Pancreatic adenocarcinoma (HCC)     Medications:  Anti-infectives    Start  Dose/Rate Route Frequency Ordered Stop   04/01/15 1800  vancomycin (VANCOCIN) 1,250 mg in sodium chloride 0.9 % 250 mL IVPB     1,250 mg 166.7 mL/hr over 90 Minutes Intravenous Every 24 hours 04/01/15 1733     04/01/15 1730  vancomycin (VANCOCIN) IVPB 1000 mg/200 mL premix  Status:  Discontinued     1,000 mg 200 mL/hr over 60 Minutes Intravenous  Once 04/01/15 1721 04/01/15 1733   03/29/15 2200  vancomycin (VANCOCIN) IVPB 1000 mg/200 mL premix  Status:  Discontinued     1,000 mg 200 mL/hr over 60 Minutes Intravenous Every 18 hours 03/29/15 1324 03/30/15 1518   03/29/15 1900  piperacillin-tazobactam (ZOSYN) IVPB 3.375 g     3.375 g 12.5 mL/hr over 240 Minutes Intravenous Every 8 hours 03/29/15 1324     03/29/15 1000  piperacillin-tazobactam (ZOSYN) IVPB 3.375 g     3.375 g 100  mL/hr over 30 Minutes Intravenous  Once 03/29/15 0949 03/29/15 1340   03/29/15 1000  vancomycin (VANCOCIN) 1,500 mg in sodium chloride 0.9 % 500 mL IVPB     1,500 mg 250 mL/hr over 120 Minutes Intravenous  Once 03/29/15 0949 03/29/15 1238     Assessment: 74 yo male already on Zosyn now has low-grade fever, shakes with wheezing and shortness of breath.  Goal of Therapy:  Vancomycin trough level 15-20 mcg/ml  Plan:  Start Vancomycin 1250mg  IVPB q24hr.  Check trough before 4th dose on 12/13 @ 1730.  Curry Dulski K 04/01/2015,5:35 PM

## 2015-04-01 NOTE — Progress Notes (Signed)
GI Note:  Full note to follow:  Liver enzymes and imaging suggest stent is working well. Doubt fevers related to the stent.  He is in very poor overall health and would not attempt ERCP with stent exchange at this time.

## 2015-04-01 NOTE — Plan of Care (Signed)
Problem: Education: Goal: Knowledge of [place] Education information/materials will improve Outcome: Progressing Educated pt about high fall risk precautions. Pt and family uses the call bell. Plan of care reviewed with pt and family.  Problem: Safety: Goal: Ability to remain free from injury will improve Outcome: Progressing Pt remained free of injury during the shift.  Problem: Physical Regulation: Goal: Ability to maintain clinical measurements within normal limits will improve Outcome: Progressing Pt was wheezing this afternoon. Respirations 19, O2 sats in the high 90's, MD notified, lasix, solumedrol and neb ordered and given with some improvement. Around 1600 Respirations 18, O2 sats 97%, pt reported SOB and wheezing, BP 152/68. MD notified, Chest x ray and solumedrol ordered and given. Notified MD about chest x-ray results, vancomycin ordered.  Pt had MRCP today. CBG's monitored. Insulin held in am and lunch due to NPO order and CBG 90. Insulin orders modified.  Low grade fever 99.2. Continue ABX.   Dilaudid x2 for abd pain with improvement.  Problem: Fluid Volume: Goal: Hemodynamic stability will improve Outcome: Progressing As above.  Problem: Physical Regulation: Goal: Signs and symptoms of infection will decrease Outcome: Progressing WBC's 6.5. Temp 99.2.  Pt on zosyn and vancomycin.

## 2015-04-01 NOTE — Plan of Care (Signed)
Problem: Safety: Goal: Ability to remain free from injury will improve Outcome: Progressing Patient with generalized weakness.  Remained on bedrest throughout shift.  Bed in lowest position and remote and call light within reach.  Bed alarm on throughout shift.  Patient without injury this shift.  Problem: Fluid Volume: Goal: Hemodynamic stability will improve Outcome: Progressing Patient on normal saline infusing at 75 mL/hr.  Temperature slowly resolving this shift.  Below 99 degrees for majority of shift.

## 2015-04-01 NOTE — Progress Notes (Signed)
[place] [facility]                                                                                                                                                                                            Patient Demographics   [name], is a 74 y.o. male, DOB - 1940/10/22, [id number]  Admit date - 03/29/2015   Admitting Physician Vaughan Basta, MD  Outpatient Primary MD for the patient is Rusty Aus., MD   LOS - 3  Subjective: He has a low-grade fever. Shaky today. Has wheezing, shortness of breath.    Review of Systems:   CONSTITUTIONAL:  + fever, fatigue present. EYES: No blurry or double vision.  ENT: No tinnitus. No postnasal drip. No redness of the oropharynx.  RESPIRATORY: Wheezing, shortness of breath. CARDIOVASCULAR: No chest pain. No orthopnea. No palpitations. No syncope.  GASTROINTESTINAL: No nausea, no vomiting or diarrhea.  Positive abdominal pain. No melena or hematochezia.  GENITOURINARY: No dysuria or hematuria.  ENDOCRINE: No polyuria or nocturia. No heat or cold intolerance.  HEMATOLOGY: No anemia. No bruising. No bleeding.  INTEGUMENTARY: No rashes. No lesions.  MUSCULOSKELETAL: No arthritis. No swelling. No gout.  NEUROLOGIC: No numbness, tingling, or ataxia. No seizure-type activity.  PSYCHIATRIC: No anxiety. No insomnia. No ADD.    Vitals:   Filed Vitals:   03/31/15 1834 03/31/15 2116 04/01/15 0458 04/01/15 1130  BP:  130/64 130/56 115/61  Pulse: 99 94 87 95  Temp:  99.3 F (37.4 C) 98.7 F (37.1 C) 97.7 F (36.5 C)  TempSrc:  Oral Oral Oral  Resp:  18 18 19   Height:      Weight:      SpO2:  97% 97% 97%    Wt Readings from Last 3 Encounters:  03/29/15 94.2 kg (207 lb 10.8 oz)  03/23/15 92.4 kg (203 lb 11.3 oz)  03/09/15 93.35 kg (205 lb 12.8 oz)     Intake/Output Summary (Last 24 hours) at 04/01/15 1300 Last data filed at 03/31/15 1800  Gross per 24 hour  Intake    120 ml   Output      0 ml  Net    120 ml    Physical Exam:   GENERAL: Pleasant-appearing in no apparent distress. Chronically ill-appearing male HEAD, EYES, EARS, NOSE AND THROAT: Atraumatic, normocephalic. Extraocular muscles are intact. Pupils equal and reactive to light. Sclerae anicteric. No conjunctival injection. No oro-pharyngeal erythema.  NECK: Supple. There is no jugular venous distention. No bruits, no lymphadenopathy, no thyromegaly.  HEART: Regular rate and rhythm,. No murmurs, no rubs, no clicks.  LUNGS:  Bilateral expiratory wheeze in all lung fields. ABDOMEN: Soft, flat, mild epigastric tenderness, nondistended. Has good bowel sounds. No hepatosplenomegaly appreciated.  EXTREMITIES: No evidence of any cyanosis, clubbing, or peripheral edema.  +2 pedal and radial pulses bilaterally.  NEUROLOGIC: The patient is alert, awake, and oriented x3 with no focal motor or sensory deficits appreciated bilaterally.  SKIN: Moist and warm with no rashes appreciated.  Psych: Not anxious, depressed LN: No inguinal LN enlargement    Antibiotics   Anti-infectives    Start     Dose/Rate Route Frequency Ordered Stop   03/29/15 2200  vancomycin (VANCOCIN) IVPB 1000 mg/200 mL premix  Status:  Discontinued     1,000 mg 200 mL/hr over 60 Minutes Intravenous Every 18 hours 03/29/15 1324 03/30/15 1518   03/29/15 1900  piperacillin-tazobactam (ZOSYN) IVPB 3.375 g     3.375 g 12.5 mL/hr over 240 Minutes Intravenous Every 8 hours 03/29/15 1324     03/29/15 1000  piperacillin-tazobactam (ZOSYN) IVPB 3.375 g     3.375 g 100 mL/hr over 30 Minutes Intravenous  Once 03/29/15 0949 03/29/15 1340   03/29/15 1000  vancomycin (VANCOCIN) 1,500 mg in sodium chloride 0.9 % 500 mL IVPB     1,500 mg 250 mL/hr over 120 Minutes Intravenous  Once 03/29/15 0949 03/29/15 1238      Medications   Scheduled Meds: . aspirin EC  81 mg Oral Daily  . gabapentin  300 mg Oral BID  . heparin  5,000 Units Subcutaneous 3 times  per day  . insulin aspart  0-9 Units Subcutaneous TID AC & HS  . insulin aspart  4 Units Subcutaneous TID WC  . insulin glargine  66 Units Subcutaneous BID  . meloxicam  7.5 mg Oral Daily  . pantoprazole  40 mg Oral QAC breakfast  . piperacillin-tazobactam (ZOSYN)  IV  3.375 g Intravenous Q8H  . pravastatin  20 mg Oral q1800   Continuous Infusions:   PRN Meds:.acetaminophen, HYDROmorphone (DILAUDID) injection, ipratropium-albuterol, ondansetron, oxyCODONE-acetaminophen, polyethylene glycol powder   Data Review:   Micro Results Recent Results (from the past 240 hour(s))  Urine culture     Status: None   Collection Time: 03/29/15  9:55 AM  Result Value Ref Range Status   Specimen Description URINE, RANDOM  Final   Special Requests NONE  Final   Culture 1,000 COLONIES/mL INSIGNIFICANT GROWTH   Final   Report Status 03/30/2015 FINAL  Final  Blood Culture (routine x 2)     Status: None (Preliminary result)   Collection Time: 03/29/15 10:00 AM  Result Value Ref Range Status   Specimen Description BLOOD LEFT HAND  Final   Special Requests BOTTLES DRAWN AEROBIC AND ANAEROBIC  11 CC  Final   Culture NO GROWTH 3 DAYS  Final   Report Status PENDING  Incomplete  Blood Culture (routine x 2)     Status: None (Preliminary result)   Collection Time: 03/29/15 10:08 AM  Result Value Ref Range Status   Specimen Description BLOOD LEFT ARM  Final   Special Requests   Final    BOTTLES DRAWN AEROBIC AND ANAEROBIC  1CC ANAERO 3CC AERO   Culture NO GROWTH 3 DAYS  Final   Report Status PENDING  Incomplete    Radiology Reports Ct Abdomen Pelvis W Contrast  03/29/2015  CLINICAL DATA:  74 year old male diagnosed with pancreatic moderately differentiated adenocarcinoma last month via endoscopic ultrasound biopsy. status post chemotherapy, radiation, stent placement. Abdominal pain and distension with fever for 2 days.  Subsequent encounter. EXAM: CT ABDOMEN AND PELVIS WITH CONTRAST TECHNIQUE:  Multidetector CT imaging of the abdomen and pelvis was performed using the standard protocol following bolus administration of intravenous contrast. CONTRAST:  84mL OMNIPAQUE IOHEXOL 300 MG/ML  SOLN COMPARISON:  CT Abdomen and Pelvis 02/14/2015 and earlier. FINDINGS: Stable cardiomegaly. No pericardial effusion. Small layering left pleural effusion is stable. Atelectasis at the lung bases. Previous sternotomy. No acute or metastatic osseous lesion identified. Stable small fat containing left inguinal hernia. No pelvic free fluid. Negative urinary bladder. Retained stool throughout the distal colon. Redundant sigmoid with some gaseous distension. Retained stool throughout the left colon, and more proximal colon. Redundant right colon. Normal appendix. Negative terminal ileum. No dilated small bowel. Small sliding-type gastric hiatal hernia. Otherwise negative stomach. CBD stent in place. Mild gaseous distension of the duodenum which otherwise appears normal. Pneumobilia has not significantly changed. Pancreatic ductal dilatation has not significantly changed. Hypodense lesions at the pancreatic head and neck appears stable (series 2, images 31 and 32). Stable peripancreatic lymph node measuring 11 mm short axis situated between the IVC and main portal vein. Other porta hepatis nodes are stable measuring up to 12 mm short axis. Liver enhancement and gallbladder are within normal limits (small volume of pneumobilia related gas in the gallbladder). Spleen and adrenal glands within normal limits. Portal venous system is patent. Aortoiliac calcified atherosclerosis noted. Major arterial structures are patent. No other abdominal or pelvic lymphadenopathy bilateral initial phase renal enhancement is stable and within normal limits. On delayed images there is decreased left renal contrast excretion. The right kidney appears stable. No abdominal free fluid. IMPRESSION: 1. Largely stable CT appearance of the abdomen since  October. CBD stent in place. Hypodense pancreatic head and neck lesions with surrounding 11-12 mm short axis lymph nodes compatible with pancreatic adenocarcinoma. No distant metastatic disease identified in the abdomen or pelvis. 2. Lack of left renal contrast excretion on delayed images today suggesting a degree of left renal insufficiency. Electronically Signed   By: Genevie Ann M.D.   On: 03/29/2015 12:27   US Venous Img Lower Bilateral  03/31/2015  CLINICAL DATA:  Swelling. EXAM: BILATERAL LOWER EXTREMITY VENOUS DOPPLER ULTRASOUND TECHNIQUE: Gray-scale sonography with graded compression, as well as color Doppler and duplex ultrasound were performed to evaluate the lower extremity deep venous systems from the level of the common femoral vein and including the common femoral, femoral, profunda femoral, popliteal and calf veins including the posterior tibial, peroneal and gastrocnemius veins when visible. The superficial great saphenous vein was also interrogated. Spectral Doppler was utilized to evaluate flow at rest and with distal augmentation maneuvers in the common femoral, femoral and popliteal veins. COMPARISON:  None. FINDINGS: RIGHT LOWER EXTREMITY Common Femoral Vein: No evidence of thrombus. Normal compressibility, respiratory phasicity and response to augmentation. Saphenofemoral Junction: No evidence of thrombus. Normal compressibility and flow on color Doppler imaging. Profunda Femoral Vein: No evidence of thrombus. Normal compressibility and flow on color Doppler imaging. Femoral Vein: No evidence of thrombus. Normal compressibility, respiratory phasicity and response to augmentation. Popliteal Vein: No evidence of thrombus. Normal compressibility, respiratory phasicity and response to augmentation. Calf Veins: No evidence of thrombus. Normal compressibility and flow on color Doppler imaging. Superficial Great Saphenous Vein: No evidence of thrombus. Normal compressibility and flow on color Doppler  imaging. Other Findings:  None. LEFT LOWER EXTREMITY Common Femoral Vein: No evidence of thrombus. Normal compressibility, respiratory phasicity and response to augmentation. Saphenofemoral Junction: No evidence of thrombus. Normal compressibility and  flow on color Doppler imaging. Profunda Femoral Vein: No evidence of thrombus. Normal compressibility and flow on color Doppler imaging. Femoral Vein: No evidence of thrombus. Normal compressibility, respiratory phasicity and response to augmentation. Popliteal Vein: No evidence of thrombus. Normal compressibility, respiratory phasicity and response to augmentation. Calf Veins: No evidence of thrombus. Normal compressibility and flow on color Doppler imaging. Superficial Great Saphenous Vein: No evidence of thrombus. Normal compressibility and flow on color Doppler imaging. Other Findings:  None. IMPRESSION: No evidence of deep venous thrombosis. Electronically Signed   By: Marcello Moores  Register   On: 03/31/2015 14:20   Dg Chest Port 1 View  03/29/2015  CLINICAL DATA:  Fever.  Pancreatic cancer. EXAM: PORTABLE CHEST 1 VIEW COMPARISON:  Chest radiograph 02/25/2015.  CT chest 02/25/2015. FINDINGS: The heart is enlarged. 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 Thoracic atherosclerosis. Low lung volumes. No definite active infiltrates or failure. No osseous findings. IMPRESSION: Low lung volumes. No definite active infiltrates or failure. Cardiomegaly. Electronically Signed   By: Staci Righter M.D.   On: 03/29/2015 10:39   Mr [name] Cm/mrcp  04/01/2015  CLINICAL DATA:  Pancreatic adenocarcinoma. EXAM: MRI ABDOMEN WITHOUT CONTRAST  (INCLUDING MRCP) TECHNIQUE: Multiplanar multisequence MR imaging of the abdomen was performed. Heavily T2-weighted images of the biliary and pancreatic ducts were obtained, and three-dimensional MRCP images were rendered by post processing. COMPARISON:  03/29/2015 FINDINGS: Images significantly degraded by motion artifact from breathing. Lower chest:  Small left  pleural effusion.  Cardiomegaly. Hepatobiliary: Mild intrahepatic biliary dilatation with the common bile duct measuring 1.6 cm. There could be some gas in the common bile duct. The CBD seems severely narrowed in the vicinity of the pancreatic head, with apparent fluid in the thin central lumen of the stent on images 20-25 of series 10 extending through this region. The stent itself is much harder to see on MR than on prior CT but is thought to still be present. No obvious enhancing hepatic mass seen, subject to reduce sensitivity from motion degradation of images. I am uncertain whether the apparent linear filling defects in the right and left portal veins are due to thrombus or artifact. Numerous tiny gallstones are present in the gallbladder. Pancreas: Dilated dorsal pancreatic duct with dilated side ducts and truncation in the pancreatic head again observed. Aside from the truncation, I do not see obvious differential enhancement in the pancreatic head to further signify the presence of the mass, although there is some soft tissue fullness in this vicinity. Spleen: Unremarkable Adrenals/Urinary Tract: Right mid kidney cyst measures about 8 mm diameter. Adrenal glands unremarkable. Stomach/Bowel: Unremarkable Vascular/Lymphatic: Known abdominal aortic atherosclerosis. A porta hepatis node measures 1.3 cm in short axis on image 18 series 7. Other: No supplemental non-categorized findings. Musculoskeletal: Unremarkable IMPRESSION: 1. Questionable linear filling defects in the right and left portal veins intrahepatic portions. Although quite likely from motion artifact, the appearance makes it difficult to confidently exclude the possibility of early portal vein thrombosis. Consider vascular ultrasound of the liver for further characterization. 2. Narrowing of the biliary tree in the vicinity of the pancreatic head. In this area, only the thing in the lumen of the stent is visualized as continuous. There is mild  intrahepatic biliary dilatation and moderate extrahepatic biliary dilatation. There is likely also some pneumobilia. 3. Numerous tiny gallstones in the gallbladder. 4. The pancreatic head mass is mainly observable due to truncation of the dilated dorsal pancreatic duct. Differential enhancement in this vicinity is not well seen. 5. Mild porta  hepatis adenopathy. 6. Small left pleural effusion with cardiomegaly. 7. Images are degraded by motion artifact. Electronically Signed   By: Van Clines M.D.   On: 04/01/2015 11:03     CBC  Recent Labs Lab 03/29/15 0955 03/30/15 0805 03/31/15 0452 04/01/15 0615  WBC 14.8* 11.4* 7.9 6.5  HGB 10.3* 9.6* 9.0* 8.8*  HCT 32.5* 30.7* 28.5* 27.7*  PLT 187 149* 162 153  MCV 85.0 86.3 86.6 86.0  MCH 26.9 27.0 27.4 27.3  MCHC 31.7* 31.2* 31.6* 31.8*  RDW 16.0* 16.0* 15.7* 15.4*  LYMPHSABS 1.1  --   --   --   MONOABS 0.1*  --   --   --   EOSABS 0.0  --   --   --   BASOSABS 0.1  --   --   --     Chemistries   Recent Labs Lab 03/29/15 0955 03/30/15 0805 03/31/15 0452 04/01/15 0615  NA 139 142 143 144  K 3.8 4.3 3.9 3.4*  CL 109 111 111 113*  CO2 22 23 25 24   GLUCOSE 206* 299* 352* 206*  BUN 31* 32* 37* 34*  CREATININE 1.69* 1.87* 2.16* 2.10*  CALCIUM 8.3* 8.3* 8.2* 8.2*  MG 1.2*  --   --   --   AST 35  --   --   --   ALT 23  --   --   --   ALKPHOS 92  --   --   --   BILITOT 1.2  --   --   --    ------------------------------------------------------------------------------------------------------------------ estimated creatinine clearance is 34.4 mL/min (by C-G formula based on Cr of 2.1). ------------------------------------------------------------------------------------------------------------------ No results for input(s): HGBA1C in the last 72 hours. ------------------------------------------------------------------------------------------------------------------ No results for input(s): CHOL, HDL, LDLCALC, TRIG, CHOLHDL,  LDLDIRECT in the last 72 hours. ------------------------------------------------------------------------------------------------------------------ No results for input(s): TSH, T4TOTAL, T3FREE, THYROIDAB in the last 72 hours.  Invalid input(s): FREET3 ------------------------------------------------------------------------------------------------------------------ No results for input(s): VITAMINB12, FOLATE, FERRITIN, TIBC, IRON, RETICCTPCT in the last 72 hours.  Coagulation profile  Recent Labs Lab 03/29/15 0955  INR 1.27    No results for input(s): DDIMER in the last 72 hours.  Cardiac Enzymes  Recent Labs Lab 03/29/15 0955  TROPONINI 0.06*   ------------------------------------------------------------------------------------------------------------------ Invalid input(s): POCBNP    Assessment & Plan   * Sepsis due to pneumobilia: Continue Zosyn. Evident by fever, tachycardia, elevated white cell count, generalized weakness. Appreciate GI consult. Patient had recent EUS, ERCP  On nov 5th thru 9th. It may be the source of infection. MRCP showed entire and extrahepatic biliary dilatation with pneumobilia. Appreciate GI follow-up on this. Continue IV antibiotics . * Adenocarcinoma of the pancreas Follows with cancer Center, recently started on chemotherapy and radiation. Prognosis poor  * Diabetes; Blood sugar slightly on the lower side due to nothing by mouth for MRCP,.  Hold the Lantus to dose sliding scale coverage only. * History of coronary artery disease Continue statin and aspirin,   *Acute renal failure on chronic renal failure chronic kidney disease stage III: Worsening shortness of breath today so we'll stop the fluids.   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  * SOB and wheezing site. Secondary to fluid overload,; the fluids, start Lasix, give you a nebulizer due to wheezing and also give a   Dose  Of Iv solumedrol. D/w wife      Code Status Orders        Start  Ordered   03/29/15 1527  Full code   Continuous     03/29/15 1526    Advance Directive Documentation        Most Recent Value   Type of Advance Directive  Healthcare Power of Attorney, Living will   Pre-existing out of facility DNR order (yellow form or pink MOST form)     "MOST" Form in Place?             Consults  infectious disease DVT Prophylaxis  heparin  Lab Results  Component Value Date   PLT 153 04/01/2015     Time Spent in minutes 43minutes   Ednamae Schiano M.D on 04/01/2015 at 1:00 PM  Between 7am to 6pm - Pager - [phone]  After 6pm go to [email or web address] - password [email or web address] [place] [facility]   Office  [phone]

## 2015-04-01 NOTE — Clinical Social Work Note (Signed)
Clinical Social Work Assessment  Patient Details  Name: [name] MRN: [id number] Date of Birth: 10/24/1940  Date of referral:  04/01/15               Reason for consult:  Facility Placement                Permission sought to share information with:  Family Supports Permission granted to share information::  Yes, Verbal Permission Granted  Name::      (Wife)  Agency::     Relationship::     Contact Information:     Housing/Transportation Living arrangements for the past 2 months:  Single Family Home Source of Information:  Patient, Spouse Patient Interpreter Needed:  None Criminal Activity/Legal Involvement Pertinent to Current Situation/Hospitalization:  No - Comment as needed Significant Relationships:  Spouse, Adult Children, Other Family Members Lives with:  Spouse Do you feel safe going back to the place where you live?  Yes Need for family participation in patient care:  Yes (Comment)  Care giving concerns:  Wife is concerned that patient is very weak and unable to work with PT.   Social Worker assessment / plan:  Holiday representative (CSW) consult for SNF placement, PT is recommending STR to assist with getting patient back to previous level of functioning.   Patient was alert, oriented with min engaged in conversation with CSW.  (Additional Information provided by wife at bedside).  CSW introduced self and explained role of CSW department.    Patient currently lives with his wife, they have a son and daughter as a support system . Per wife patient was walking with no assistance device until last week.  Started Chemo Monday, Radiation Tues and then became weak.    CSW explained that PT is recommending rehab.  Patient has never been to SNF. CSW reviewed SNF process with patient, Patient and and provided them with a SNF list of facilities.  Wife and patient are not in agreeable to SNF search at this time.  Informed CWS they would like to wait and talk about SNF after  they talk with the MD.  At this point they are not sure if patient will continue his treatment.   CSW will continue to follow patient and assist with ongoing and discharge needs  Employment status:  Disabled (Comment on whether or not currently receiving Disability) Insurance information:  Managed Medicare PT Recommendations:  [place] / Referral to community resources:   (SNF pending at this time will follow-up)  Patient/Family's Response to care:  Patient was appreciative of information provided by CSW and will review the SNF list.  Patient/Family's Understanding of and Emotional Response to Diagnosis, Current Treatment, and Prognosis:  Patient and wife understands that her is under continued medical work up at this time.  Once medically stable they will decide on discharge plan.  Emotional Assessment Appearance:  Appears stated age Attitude/Demeanor/Rapport:  Lethargic Affect (typically observed):  Quiet, Calm Orientation:  Oriented to Self, Oriented to Place, Oriented to  Time, Oriented to Situation Alcohol / Substance use:  Never Used Psych involvement (Current and /or in the community):  No (Comment)  Discharge Needs  Concerns to be addressed:  Care Coordination, Discharge Planning Concerns Readmission within the last 30 days:  Yes Current discharge risk:  Chronically ill, Dependent with Mobility Barriers to Discharge:  Continued Medical Work up   Maurine Cane, LCSW 04/01/2015, 4:44 PM

## 2015-04-02 LAB — GLUCOSE, CAPILLARY
GLUCOSE-CAPILLARY: 424 mg/dL — AB (ref 65–99)
GLUCOSE-CAPILLARY: 477 mg/dL — AB (ref 65–99)
GLUCOSE-CAPILLARY: 489 mg/dL — AB (ref 65–99)
GLUCOSE-CAPILLARY: 503 mg/dL — AB (ref 65–99)
Glucose-Capillary: 412 mg/dL — ABNORMAL HIGH (ref 65–99)
Glucose-Capillary: 406 mg/dL — ABNORMAL HIGH (ref 65–99)
Glucose-Capillary: 452 mg/dL — ABNORMAL HIGH (ref 65–99)
Glucose-Capillary: 439 mg/dL — ABNORMAL HIGH (ref 65–99)

## 2015-04-02 LAB — CREATININE, SERUM
Creatinine, Ser: 1.9 mg/dL — ABNORMAL HIGH (ref 0.61–1.24)
GFR calc Af Amer: 38 mL/min — ABNORMAL LOW (ref >60)
GFR calc non Af Amer: 33 mL/min — ABNORMAL LOW (ref >60)

## 2015-04-02 MED ORDER — INSULIN ASPART 100 UNIT/ML ~~LOC~~ SOLN
0.0000 [IU] | Freq: Three times a day (TID) | SUBCUTANEOUS | Status: DC
  Administered 2015-04-02 – 2015-04-03 (×3): 15 [IU] via SUBCUTANEOUS
  Administered 2015-04-03 (×2): 11 [IU] via SUBCUTANEOUS
  Administered 2015-04-04: 8 [IU] via SUBCUTANEOUS
  Administered 2015-04-04: 15 [IU] via SUBCUTANEOUS
  Administered 2015-04-04: 5 [IU] via SUBCUTANEOUS
  Administered 2015-04-04: 22:00:00 11 [IU] via SUBCUTANEOUS
  Administered 2015-04-05: 23:00:00 3 [IU] via SUBCUTANEOUS
  Administered 2015-04-05: 8 [IU] via SUBCUTANEOUS
  Administered 2015-04-05: 5 [IU] via SUBCUTANEOUS
  Administered 2015-04-05 – 2015-04-06 (×2): 8 [IU] via SUBCUTANEOUS
  Administered 2015-04-06: 08:00:00 2 [IU] via SUBCUTANEOUS
  Filled 2015-04-02: qty 15
  Filled 2015-04-02: qty 8
  Filled 2015-04-02 (×2): qty 5
  Filled 2015-04-02: qty 11
  Filled 2015-04-02: qty 8
  Filled 2015-04-02: qty 2
  Filled 2015-04-02: qty 15
  Filled 2015-04-02: qty 8
  Filled 2015-04-02: qty 15
  Filled 2015-04-02: qty 5
  Filled 2015-04-02: qty 8
  Filled 2015-04-02: qty 11
  Filled 2015-04-02 (×2): qty 15

## 2015-04-02 MED ORDER — INSULIN ASPART 100 UNIT/ML ~~LOC~~ SOLN
1.0000 [IU] | Freq: Once | SUBCUTANEOUS | Status: AC
  Administered 2015-04-02: 1 [IU] via SUBCUTANEOUS
  Filled 2015-04-02: qty 1

## 2015-04-02 MED ORDER — INSULIN ASPART 100 UNIT/ML ~~LOC~~ SOLN: 0.0000 [IU] | Freq: Three times a day (TID) | SUBCUTANEOUS | Status: DC

## 2015-04-02 MED ORDER — INSULIN ASPART 100 UNIT/ML ~~LOC~~ SOLN
4.0000 [IU] | Freq: Once | SUBCUTANEOUS | Status: AC
  Administered 2015-04-02: 4 [IU] via SUBCUTANEOUS
  Filled 2015-04-02: qty 4

## 2015-04-02 MED ORDER — INSULIN GLARGINE 100 UNIT/ML ~~LOC~~ SOLN
40.0000 [IU] | Freq: Two times a day (BID) | SUBCUTANEOUS | Status: DC
  Administered 2015-04-02: 40 [IU] via SUBCUTANEOUS
  Filled 2015-04-02 (×3): qty 0.4

## 2015-04-02 NOTE — Progress Notes (Signed)
Sent message to pharmacy at 1111 about missing lantus dose. Called pharmacy at 1204 about missing lantus dose still not being received.  Called pharmacy again at 12:40 due to still not having received Lantus insulin and pt's CBG being high.  Pharmacy finally sent Lantus and dose was given at 12:26.  Clarise Cruz, RN

## 2015-04-02 NOTE — Plan of Care (Signed)
Problem: Safety: Goal: Ability to remain free from injury will improve Outcome: Progressing Pt is high falls, bed alarm in use, call bell within reach and pt using to call out when assistance is needed, hourly safety rounds  Problem: Physical Regulation: Goal: Ability to maintain clinical measurements within normal limits will improve Outcome: Progressing VSS, afebrile, pt c/o pain in abdomen, prn meds given with improvement, pt resting comfortably in bed

## 2015-04-02 NOTE — Progress Notes (Signed)
Dr. Vianne Bulls made aware of pt's CBG of 477 and 489. Pt to receive 9 units SSI and MD gave telephone order for an additional 4 units. Pt will receive 12 units total of insulin per Dr. Vianne Bulls. Clarise Cruz, RN

## 2015-04-02 NOTE — Progress Notes (Signed)
Dr. Vianne Bulls made aware of pt's CBG of 424. Pt to receive 9 units SSI and MD gave telephone order for an additional 1 unit. Pt will receive 10 units total of insulin per Dr. Vianne Bulls.  Will also give his 30 units on Lantus which is late at this time due to pharmacy delay.  Clarise Cruz, RN

## 2015-04-02 NOTE — Plan of Care (Signed)
Problem: Safety: Goal: Ability to remain free from injury will improve Outcome: Progressing Patient continues with generalized weakness. Patient on high risk precautions.  Remained on bedrest throughout shift. Bed in lowest position and remote and call light within reach. Bed alarm on throughout shift. Patient compliant with calling for needs. Patient without injury this shift.  Problem: Fluid Volume: Goal: Hemodynamic stability will improve Outcome: Progressing Patient fluids discontinued on day shift.  Patient received Vancomycin and Zosyn x 2 this shift.  Tolerated well.  Lung sounds were clear but diminished.  No sign of wheezing this shift.  Solu-medrol 60mg  given per eMAR.

## 2015-04-02 NOTE — Progress Notes (Signed)
Dr. Vianne Bulls made aware of pt's CBG of 412 and 406.  Pt to receive 9 units SSI and MD gave telephone order for an additional 4 units.  Pt will receive 12 units total of insulin per Dr. Vianne Bulls.  Clarise Cruz, RN

## 2015-04-02 NOTE — Progress Notes (Signed)
[place] [facility]                                                                                                                                                                                            Patient Demographics   [name], is a 74 y.o. male, DOB - 1940/09/19, EX:[id number]  Admit date - 03/29/2015   Admitting Physician Vaughan Basta, MD  Outpatient Primary MD for the patient is Rusty Aus., MD   LOS - 4  Subjective: No further fevers. Wheezing resolved. Has constipation issues.    Review of Systems:   CONSTITUTIONAL:  , fatigue present. EYES: No blurry or double vision.  ENT: No tinnitus. No postnasal drip. No redness of the oropharynx.  RESPIRATORY: No wheezing, no shortness of breath CARDIOVASCULAR: No chest pain. No orthopnea. No palpitations. No syncope.  GASTROINTESTINAL: No nausea, no vomiting or diarrhea.  Positive abdominal pain. No melena or hematochezia.  GENITOURINARY: No dysuria or hematuria.  ENDOCRINE: No polyuria or nocturia. No heat or cold intolerance.  HEMATOLOGY: No anemia. No bruising. No bleeding.  INTEGUMENTARY: No rashes. No lesions.  MUSCULOSKELETAL: No arthritis. No swelling. No gout.  NEUROLOGIC: No numbness, tingling, or ataxia. No seizure-type activity.  PSYCHIATRIC: No anxiety. No insomnia. No ADD.    Vitals:   Filed Vitals:   04/01/15 1831 04/01/15 2149 04/02/15 0446 04/02/15 0457  BP:  157/70  154/74  Pulse:  83  62  Temp: 98.7 F (37.1 C) 97.5 F (36.4 C)  97.8 F (36.6 C)  TempSrc: Oral Oral  Oral  Resp:  18  18  Height:      Weight:      SpO2:  97% 96% 97%    Wt Readings from Last 3 Encounters:  03/29/15 94.2 kg (207 lb 10.8 oz)  03/23/15 92.4 kg (203 lb 11.3 oz)  03/09/15 93.35 kg (205 lb 12.8 oz)     Intake/Output Summary (Last 24 hours) at 04/02/15 1247 Last data filed at 04/02/15 0800  Gross per 24 hour  Intake    720 ml  Output    200 ml  Net     520 ml    Physical Exam:   GENERAL: Pleasant-appearing in no apparent distress. Chronically ill-appearing male HEAD, EYES, EARS, NOSE AND THROAT: Atraumatic, normocephalic. Extraocular muscles are intact. Pupils equal and reactive to light. Sclerae anicteric. No conjunctival injection. No oro-pharyngeal erythema.  NECK: Supple. There is no jugular venous distention. No bruits, no lymphadenopathy, no thyromegaly.  HEART: Regular rate and rhythm,. No murmurs, no rubs, no clicks.  LUNGS: Bilateral expiratory wheeze in all  lung fields. ABDOMEN: Soft, flat, mild epigastric tenderness, nondistended. Has good bowel sounds. No hepatosplenomegaly appreciated.  EXTREMITIES: No evidence of any cyanosis, clubbing, or peripheral edema.  +2 pedal and radial pulses bilaterally.  NEUROLOGIC: The patient is alert, awake, and oriented x3 with no focal motor or sensory deficits appreciated bilaterally.  SKIN: Moist and warm with no rashes appreciated.  Psych: Not anxious, depressed LN: No inguinal LN enlargement    Antibiotics   Anti-infectives    Start     Dose/Rate Route Frequency Ordered Stop   04/01/15 1800  vancomycin (VANCOCIN) 1,250 mg in sodium chloride 0.9 % 250 mL IVPB     1,250 mg 166.7 mL/hr over 90 Minutes Intravenous Every 24 hours 04/01/15 1733     04/01/15 1730  vancomycin (VANCOCIN) IVPB 1000 mg/200 mL premix  Status:  Discontinued     1,000 mg 200 mL/hr over 60 Minutes Intravenous  Once 04/01/15 1721 04/01/15 1733   03/29/15 2200  vancomycin (VANCOCIN) IVPB 1000 mg/200 mL premix  Status:  Discontinued     1,000 mg 200 mL/hr over 60 Minutes Intravenous Every 18 hours 03/29/15 1324 03/30/15 1518   03/29/15 1900  piperacillin-tazobactam (ZOSYN) IVPB 3.375 g     3.375 g 12.5 mL/hr over 240 Minutes Intravenous Every 8 hours 03/29/15 1324     03/29/15 1000  piperacillin-tazobactam (ZOSYN) IVPB 3.375 g     3.375 g 100 mL/hr over 30 Minutes Intravenous  Once 03/29/15 0949 03/29/15 1340    03/29/15 1000  vancomycin (VANCOCIN) 1,500 mg in sodium chloride 0.9 % 500 mL IVPB     1,500 mg 250 mL/hr over 120 Minutes Intravenous  Once 03/29/15 0949 03/29/15 1238      Medications   Scheduled Meds: . aspirin EC  81 mg Oral Daily  . gabapentin  300 mg Oral BID  . heparin  5,000 Units Subcutaneous 3 times per day  . insulin aspart  0-9 Units Subcutaneous TID AC & HS  . insulin glargine  40 Units Subcutaneous BID  . meloxicam  7.5 mg Oral Daily  . methylPREDNISolone (SOLU-MEDROL) injection  60 mg Intravenous Q12H  . pantoprazole  40 mg Oral QAC breakfast  . piperacillin-tazobactam (ZOSYN)  IV  3.375 g Intravenous Q8H  . pravastatin  20 mg Oral q1800  . vancomycin  1,250 mg Intravenous Q24H   Continuous Infusions:   PRN Meds:.acetaminophen, HYDROmorphone (DILAUDID) injection, ipratropium-albuterol, ondansetron, oxyCODONE-acetaminophen, polyethylene glycol powder   Data Review:   Micro Results Recent Results (from the past 240 hour(s))  Urine culture     Status: None   Collection Time: 03/29/15  9:55 AM  Result Value Ref Range Status   Specimen Description URINE, RANDOM  Final   Special Requests NONE  Final   Culture 1,000 COLONIES/mL INSIGNIFICANT GROWTH   Final   Report Status 03/30/2015 FINAL  Final  Blood Culture (routine x 2)     Status: None (Preliminary result)   Collection Time: 03/29/15 10:00 AM  Result Value Ref Range Status   Specimen Description BLOOD LEFT HAND  Final   Special Requests BOTTLES DRAWN AEROBIC AND ANAEROBIC  11 CC  Final   Culture NO GROWTH 4 DAYS  Final   Report Status PENDING  Incomplete  Blood Culture (routine x 2)     Status: None (Preliminary result)   Collection Time: 03/29/15 10:08 AM  Result Value Ref Range Status   Specimen Description BLOOD LEFT ARM  Final   Special Requests   Final  BOTTLES DRAWN AEROBIC AND ANAEROBIC  1CC ANAERO 3CC AERO   Culture NO GROWTH 4 DAYS  Final   Report Status PENDING  Incomplete     Radiology Reports Ct Abdomen Pelvis W Contrast  03/29/2015  CLINICAL DATA:  74 year old male diagnosed with pancreatic moderately differentiated adenocarcinoma last month via endoscopic ultrasound biopsy. status post chemotherapy, radiation, stent placement. Abdominal pain and distension with fever for 2 days. Subsequent encounter. EXAM: CT ABDOMEN AND PELVIS WITH CONTRAST TECHNIQUE: Multidetector CT imaging of the abdomen and pelvis was performed using the standard protocol following bolus administration of intravenous contrast. CONTRAST:  82mL OMNIPAQUE IOHEXOL 300 MG/ML  SOLN COMPARISON:  CT Abdomen and Pelvis 02/14/2015 and earlier. FINDINGS: Stable cardiomegaly. No pericardial effusion. Small layering left pleural effusion is stable. Atelectasis at the lung bases. Previous sternotomy. No acute or metastatic osseous lesion identified. Stable small fat containing left inguinal hernia. No pelvic free fluid. Negative urinary bladder. Retained stool throughout the distal colon. Redundant sigmoid with some gaseous distension. Retained stool throughout the left colon, and more proximal colon. Redundant right colon. Normal appendix. Negative terminal ileum. No dilated small bowel. Small sliding-type gastric hiatal hernia. Otherwise negative stomach. CBD stent in place. Mild gaseous distension of the duodenum which otherwise appears normal. Pneumobilia has not significantly changed. Pancreatic ductal dilatation has not significantly changed. Hypodense lesions at the pancreatic head and neck appears stable (series 2, images 31 and 32). Stable peripancreatic lymph node measuring 11 mm short axis situated between the IVC and main portal vein. Other porta hepatis nodes are stable measuring up to 12 mm short axis. Liver enhancement and gallbladder are within normal limits (small volume of pneumobilia related gas in the gallbladder). Spleen and adrenal glands within normal limits. Portal venous system is patent.  Aortoiliac calcified atherosclerosis noted. Major arterial structures are patent. No other abdominal or pelvic lymphadenopathy bilateral initial phase renal enhancement is stable and within normal limits. On delayed images there is decreased left renal contrast excretion. The right kidney appears stable. No abdominal free fluid. IMPRESSION: 1. Largely stable CT appearance of the abdomen since October. CBD stent in place. Hypodense pancreatic head and neck lesions with surrounding 11-12 mm short axis lymph nodes compatible with pancreatic adenocarcinoma. No distant metastatic disease identified in the abdomen or pelvis. 2. Lack of left renal contrast excretion on delayed images today suggesting a degree of left renal insufficiency. Electronically Signed   By: Genevie Ann M.D.   On: 03/29/2015 12:27   US Venous Img Lower Bilateral  03/31/2015  CLINICAL DATA:  Swelling. EXAM: BILATERAL LOWER EXTREMITY VENOUS DOPPLER ULTRASOUND TECHNIQUE: Gray-scale sonography with graded compression, as well as color Doppler and duplex ultrasound were performed to evaluate the lower extremity deep venous systems from the level of the common femoral vein and including the common femoral, femoral, profunda femoral, popliteal and calf veins including the posterior tibial, peroneal and gastrocnemius veins when visible. The superficial great saphenous vein was also interrogated. Spectral Doppler was utilized to evaluate flow at rest and with distal augmentation maneuvers in the common femoral, femoral and popliteal veins. COMPARISON:  None. FINDINGS: RIGHT LOWER EXTREMITY Common Femoral Vein: No evidence of thrombus. Normal compressibility, respiratory phasicity and response to augmentation. Saphenofemoral Junction: No evidence of thrombus. Normal compressibility and flow on color Doppler imaging. Profunda Femoral Vein: No evidence of thrombus. Normal compressibility and flow on color Doppler imaging. Femoral Vein: No evidence of thrombus.  Normal compressibility, respiratory phasicity and response to augmentation. Popliteal Vein: No evidence  of thrombus. Normal compressibility, respiratory phasicity and response to augmentation. Calf Veins: No evidence of thrombus. Normal compressibility and flow on color Doppler imaging. Superficial Great Saphenous Vein: No evidence of thrombus. Normal compressibility and flow on color Doppler imaging. Other Findings:  None. LEFT LOWER EXTREMITY Common Femoral Vein: No evidence of thrombus. Normal compressibility, respiratory phasicity and response to augmentation. Saphenofemoral Junction: No evidence of thrombus. Normal compressibility and flow on color Doppler imaging. Profunda Femoral Vein: No evidence of thrombus. Normal compressibility and flow on color Doppler imaging. Femoral Vein: No evidence of thrombus. Normal compressibility, respiratory phasicity and response to augmentation. Popliteal Vein: No evidence of thrombus. Normal compressibility, respiratory phasicity and response to augmentation. Calf Veins: No evidence of thrombus. Normal compressibility and flow on color Doppler imaging. Superficial Great Saphenous Vein: No evidence of thrombus. Normal compressibility and flow on color Doppler imaging. Other Findings:  None. IMPRESSION: No evidence of deep venous thrombosis. Electronically Signed   By: Marcello Moores  Register   On: 03/31/2015 14:20   Dg Chest Port 1 View  04/01/2015  CLINICAL DATA:  74 year old with current history of pancreatic cancer, sepsis, and persistent fevers. EXAM: PORTABLE CHEST 1 VIEW COMPARISON:  03/29/2015 and earlier, including CT chest 02/25/2015. FINDINGS: Prior sternotomy for CABG. Cardiac silhouette moderately enlarged, unchanged. Pulmonary vascularity normal without evidence of pulmonary edema. Suboptimal inspiration with atelectasis in the lung bases, left greater than right, increased since the examination 3 days ago. No new pulmonary parenchymal abnormalities elsewhere.  IMPRESSION: 1. Suboptimal inspiration accounts for bibasilar atelectasis, left greater than right, worse than the examination 3 days ago. 2. Stable cardiomegaly without pulmonary edema. 3. No new abnormalities elsewhere. Electronically Signed   By: Evangeline Dakin M.D.   On: 04/01/2015 17:04   Dg Chest Port 1 View  03/29/2015  CLINICAL DATA:  Fever.  Pancreatic cancer. EXAM: PORTABLE CHEST 1 VIEW COMPARISON:  Chest radiograph 02/25/2015.  CT chest 02/25/2015. FINDINGS: The heart is enlarged. 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 Thoracic atherosclerosis. Low lung volumes. No definite active infiltrates or failure. No osseous findings. IMPRESSION: Low lung volumes. No definite active infiltrates or failure. Cardiomegaly. Electronically Signed   By: Staci Righter M.D.   On: 03/29/2015 10:39   Mr [name] Cm/mrcp  04/01/2015  CLINICAL DATA:  Pancreatic adenocarcinoma. EXAM: MRI ABDOMEN WITHOUT CONTRAST  (INCLUDING MRCP) TECHNIQUE: Multiplanar multisequence MR imaging of the abdomen was performed. Heavily T2-weighted images of the biliary and pancreatic ducts were obtained, and three-dimensional MRCP images were rendered by post processing. COMPARISON:  03/29/2015 FINDINGS: Images significantly degraded by motion artifact from breathing. Lower chest:  Small left pleural effusion.  Cardiomegaly. Hepatobiliary: Mild intrahepatic biliary dilatation with the common bile duct measuring 1.6 cm. There could be some gas in the common bile duct. The CBD seems severely narrowed in the vicinity of the pancreatic head, with apparent fluid in the thin central lumen of the stent on images 20-25 of series 10 extending through this region. The stent itself is much harder to see on MR than on prior CT but is thought to still be present. No obvious enhancing hepatic mass seen, subject to reduce sensitivity from motion degradation of images. I am uncertain whether the apparent linear filling defects in the right and left portal veins are due to  thrombus or artifact. Numerous tiny gallstones are present in the gallbladder. Pancreas: Dilated dorsal pancreatic duct with dilated side ducts and truncation in the pancreatic head again observed. Aside from the truncation, I do not see obvious  differential enhancement in the pancreatic head to further signify the presence of the mass, although there is some soft tissue fullness in this vicinity. Spleen: Unremarkable Adrenals/Urinary Tract: Right mid kidney cyst measures about 8 mm diameter. Adrenal glands unremarkable. Stomach/Bowel: Unremarkable Vascular/Lymphatic: Known abdominal aortic atherosclerosis. A porta hepatis node measures 1.3 cm in short axis on image 18 series 7. Other: No supplemental non-categorized findings. Musculoskeletal: Unremarkable IMPRESSION: 1. Questionable linear filling defects in the right and left portal veins intrahepatic portions. Although quite likely from motion artifact, the appearance makes it difficult to confidently exclude the possibility of early portal vein thrombosis. Consider vascular ultrasound of the liver for further characterization. 2. Narrowing of the biliary tree in the vicinity of the pancreatic head. In this area, only the thing in the lumen of the stent is visualized as continuous. There is mild intrahepatic biliary dilatation and moderate extrahepatic biliary dilatation. There is likely also some pneumobilia. 3. Numerous tiny gallstones in the gallbladder. 4. The pancreatic head mass is mainly observable due to truncation of the dilated dorsal pancreatic duct. Differential enhancement in this vicinity is not well seen. 5. Mild porta hepatis adenopathy. 6. Small left pleural effusion with cardiomegaly. 7. Images are degraded by motion artifact. Electronically Signed   By: Van Clines M.D.   On: 04/01/2015 11:03     CBC  Recent Labs Lab 03/29/15 0955 03/30/15 0805 03/31/15 0452 04/01/15 0615  WBC 14.8* 11.4* 7.9 6.5  HGB 10.3* 9.6* 9.0* 8.8*   HCT 32.5* 30.7* 28.5* 27.7*  PLT 187 149* 162 153  MCV 85.0 86.3 86.6 86.0  MCH 26.9 27.0 27.4 27.3  MCHC 31.7* 31.2* 31.6* 31.8*  RDW 16.0* 16.0* 15.7* 15.4*  LYMPHSABS 1.1  --   --   --   MONOABS 0.1*  --   --   --   EOSABS 0.0  --   --   --   BASOSABS 0.1  --   --   --     Chemistries   Recent Labs Lab 03/29/15 0955 03/30/15 0805 03/31/15 0452 04/01/15 0615 04/02/15 0540  NA 139 142 143 144  --   K 3.8 4.3 3.9 3.4*  --   CL 109 111 111 113*  --   CO2 22 23 25 24   --   GLUCOSE 206* 299* 352* 206*  --   BUN 31* 32* 37* 34*  --   CREATININE 1.69* 1.87* 2.16* 2.10* 1.90*  CALCIUM 8.3* 8.3* 8.2* 8.2*  --   MG 1.2*  --   --   --   --   AST 35  --   --   --   --   ALT 23  --   --   --   --   ALKPHOS 92  --   --   --   --   BILITOT 1.2  --   --   --   --    ------------------------------------------------------------------------------------------------------------------ estimated creatinine clearance is 38 mL/min (by C-G formula based on Cr of 1.9). ------------------------------------------------------------------------------------------------------------------ No results for input(s): HGBA1C in the last 72 hours. ------------------------------------------------------------------------------------------------------------------ No results for input(s): CHOL, HDL, LDLCALC, TRIG, CHOLHDL, LDLDIRECT in the last 72 hours. ------------------------------------------------------------------------------------------------------------------ No results for input(s): TSH, T4TOTAL, T3FREE, THYROIDAB in the last 72 hours.  Invalid input(s): FREET3 ------------------------------------------------------------------------------------------------------------------ No results for input(s): VITAMINB12, FOLATE, FERRITIN, TIBC, IRON, RETICCTPCT in the last 72 hours.  Coagulation profile  Recent Labs Lab 03/29/15 0955  INR 1.27    No results for  input(s): DDIMER in the last 72  hours.  Cardiac Enzymes  Recent Labs Lab 03/29/15 0955  TROPONINI 0.06*   ------------------------------------------------------------------------------------------------------------------ Invalid input(s): POCBNP    Assessment & Plan   * Sepsis due to pneumobilia: Bilateral pneumonia: Continue vancomycin, Zosyn. Vancomycin started yesterday secondary to wheezing, pneumonia found on x-ray to cover for MRSA. Patient has no fevers. Breathing is better. No wheezing today. Seen by GI. Thinks that stent is in place and no further GI workup is planned Given normal LFT's, unlikely that fever is related to biliary stent.  Discussed  with the patient.* Adenocarcinoma of the pancreas Follows with cancer Center, recently started on chemotherapy and radiation. Prognosis poor  * Diabetes; Hyperglycemia secondary to steroid use: Increase the Lantus, use sliding scale coverage .  * History of coronary artery disease Continue statin and aspirin,   *Acute renal failure on chronic renal failure chronic kidney disease stage III:  Improved  renal function. 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  * SOB and wheezing site. Due  to bilateral pneumonia: Improving with antibiotics, nebulizers, steroids.   Deconditioning: Physical therapy recommends SNF/STR placement.  D/w with the wife.    Code Status Orders        Start     Ordered   03/29/15 1527  Full code   Continuous     03/29/15 1526    Advance Directive Documentation        Most Recent Value   Type of Advance Directive  Healthcare Power of Attorney, Living will   Pre-existing out of facility DNR order (yellow form or pink MOST form)     "MOST" Form in Place?             Consults  infectious disease DVT Prophylaxis  heparin  Lab Results  Component Value Date   PLT 153 04/01/2015     Time Spent in minutes 29minutes   Tahtiana Rozier M.D on  04/02/2015 at 12:47 PM  Between 7am to 6pm - Pager - [phone]  After 6pm go to [email or web address] - password [email or web address] [facility]   Office  [phone]

## 2015-04-02 NOTE — Consult Note (Signed)
GI Inpatient Consult Note  Reason for Consult: Fever following recent ERCP with stent   Attending Requesting Consult: Dr. Posey Pronto  History of Present Illness: [name] is a 74 y.o. male with a significant past medical history of recent pancreatic cancer diagnosis, Chronic kidney disease, stage IV, asymptomatic bilateral carotid artery stenosis, diabetes type 2, COPD, CAD  Was hospitalized on March 29, 2015 after feeling weak.  He is currently being treated with chemotherapy and radiation.  In the emergency department he was found have a fever of 102, WBC of 14.8 and was tachycardic.  He had an ERCP completed by Dr. Allen Norris  On February 13, 2015. Impression ; the major papilla it appeared to be bulging.   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  The entire main bile duct was dilated.  A sphincterotomy was performed.  One plastic stent was placed into the common bile duct.  Recommendation; repeat ERCP in 3 months to exchange the stent.  He was seen in our office on February 24, 2015 by Dr. Rayann Heman, at that time he was doing well overall.  Jaundice had nearly gone away since the ERCP.  No abdominal pain.  This information was all retrieved from medical records.   Past Medical History:  Past Medical History  Diagnosis Date  . Hyperlipidemia   . H/O cardiac catheterization 06/03/06,12/03/11  . BPH (benign prostatic hypertrophy)   . Anxiety   . Syncope     LAUGHING INDUCED  . Myocardial infarction ([place])     x 2  . CHF (congestive heart failure) ([place])   . CAD (coronary artery disease) 06/03/06    cypher 2.5 x 11mm DES for 80% mid RCA sees Dr. Ubaldo Glassing  . Asthma     as a child  . Sleep apnea     hx of  . Kidney stone     hx of  . GERD (gastroesophageal reflux disease)     on medication for  . Cancer ([place])     hx of skin ca in "corner of left eye"  . Arthritis   . Anemia     taking iron  . Cataract, bilateral   . Neuromuscular disorder ([place])     diabetic neuropathy  . Diabetes mellitus  without complication ([place])   . Pancreatic adenocarcinoma [facility])     Problem List: Patient Active Problem List   Diagnosis Date Noted  . Fever 03/29/2015  . Sepsis ([place]) 02/25/2015  . Pancreatic cancer ([place]) 02/22/2015  . Pancreas neoplasm   . Acquired hyperbilirubinemia   . Disease of biliary tract   . Obstructive hyperbilirubinemia   . Jaundice   . Obstructive jaundice 02/10/2015  . Angina at rest [facility]) 10/26/2014  . 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 Diabetes mellitus   . Hyperlipidemia   . CAD (coronary artery disease)   . Sleep apnea   . Asthma   . BPH (benign prostatic hypertrophy)   . Anxiety   . Syncope     Past Surgical History: Past Surgical History  Procedure Laterality Date  . Spine surgery  9/05    CERVICAL LAMINECTOMY  . Cervical herniated    . Eye surgery      as a child  . Coronary artery bypass graft  12/12/2011    Procedure: CORONARY ARTERY BYPASS GRAFTING (CABG);  Surgeon: Gaye Pollack, MD;  Location: [place];  Service: Open Heart Surgery;  Laterality: N/A;  coronary artery bypass graft times four using left internal mammary artery, right  leg greater saphenous vein harvested endoscopically  . Cardiac catheterization N/A 10/26/2014    Procedure: Left Heart Cath;  Surgeon: Isaias Cowman, MD;  Location: [place] CV LAB;  Service: Cardiovascular;  Laterality: N/A;  . Endoscopic retrograde cholangiopancreatography (ercp) with propofol N/A 02/13/2015    Procedure: ENDOSCOPIC RETROGRADE CHOLANGIOPANCREATOGRAPHY (ERCP) WITH PROPOFOL;  Surgeon: Lucilla Lame, MD;  Location: [facility] ENDOSCOPY;  Service: Endoscopy;  Laterality: N/A;  . Eus N/A 02/23/2015    Procedure: UPPER ENDOSCOPIC ULTRASOUND (EUS) LINEAR;  Surgeon: Holly Bodily, MD;  Location: [facility] ENDOSCOPY;  Service: Gastroenterology;  Laterality: N/A;    Allergies: Allergies  Allergen Reactions  . Zithromax [Azithromycin] Anaphylaxis and Other (See Comments)    Reaction:  Stroke-like symptoms   .  Lisinopril Other (See Comments)    Per spouse, pt did not tolerate well in the past.      Home Medications: Prescriptions prior to admission  Medication Sig Dispense Refill Last Dose  . acetaminophen (TYLENOL) 650 MG CR tablet Take 1,300 mg by mouth every 8 (eight) hours as needed for pain.   prn at prn  . aspirin EC 81 MG tablet Take 81 mg by mouth daily.   03/28/2015 at Unknown time  . gabapentin (NEURONTIN) 300 MG capsule Take 300 mg by mouth 2 (two) times daily.   03/28/2015 at Unknown time  . HYDROcodone-acetaminophen (NORCO/VICODIN) 5-325 MG tablet Take 1 tablet by mouth every 6 (six) hours as needed. For pain.   prn at prn  . insulin glargine (LANTUS) 100 UNIT/ML injection Inject 60 Units into the skin 2 (two) times daily.    03/28/2015 at Unknown time  . insulin regular (NOVOLIN R,HUMULIN R) 100 units/mL injection Inject 5-15 Units into the skin 3 (three) times daily before meals. Pt uses as needed per sliding scale.   prn at prn  . lovastatin (MEVACOR) 20 MG tablet Take 20 mg by mouth at bedtime.   03/28/2015 at Unknown time  . meloxicam (MOBIC) 7.5 MG tablet Take 1 tablet by mouth daily.   03/28/2015 at Unknown time  . omeprazole (PRILOSEC) 20 MG capsule Take 20 mg by mouth 2 (two) times daily.   03/28/2015 at Unknown time  . ondansetron (ZOFRAN) 4 MG tablet Take 4 mg by mouth every 8 (eight) hours as needed for nausea or vomiting.   prn at prn  . polyethylene glycol powder (GLYCOLAX/MIRALAX) powder Take 17 g by mouth daily as needed. Constipation.   prn at prn  . torsemide (DEMADEX) 20 MG tablet Take 40 mg by mouth 2 (two) times daily.   03/28/2015 at Unknown time  . cephALEXin (KEFLEX) 500 MG capsule Take 1 capsule (500 mg total) by mouth 3 (three) times daily. 30 capsule 0 Taking   Home medication reconciliation was completed with the patient.   Scheduled Inpatient Medications:   . aspirin EC  81 mg Oral Daily  . gabapentin  300 mg Oral BID  . heparin  5,000 Units Subcutaneous 3 times  per day  . insulin aspart  0-9 Units Subcutaneous TID AC & HS  . insulin glargine  30 Units Subcutaneous BID  . meloxicam  7.5 mg Oral Daily  . methylPREDNISolone (SOLU-MEDROL) injection  60 mg Intravenous Q12H  . pantoprazole  40 mg Oral QAC breakfast  . piperacillin-tazobactam (ZOSYN)  IV  3.375 g Intravenous Q8H  . pravastatin  20 mg Oral q1800  . vancomycin  1,250 mg Intravenous Q24H    Continuous Inpatient Infusions:  PRN Inpatient Medications:  acetaminophen, HYDROmorphone (DILAUDID) injection, ipratropium-albuterol, ondansetron, oxyCODONE-acetaminophen, polyethylene glycol powder  Family History: family history includes Diabetes Mellitus II in his brother, mother, and sister; Leukemia in his father; Pancreatic cancer in his mother.    Social History:   reports that he quit smoking about 37 years ago. His smoking use included Cigarettes and Cigars. He has never used smokeless tobacco. He reports that he does not drink alcohol or use illicit drugs.   Review of Systems: did not assess   Physical Examination: BP 154/74 mmHg  Pulse 62  Temp(Src) 97.8 F (36.6 C) (Oral)  Resp 18  Ht 5\' 8"  (1.727 m)  Wt 94.2 kg (207 lb 10.8 oz)  BMI 31.58 kg/m2  SpO2 97% Did not assess  Data: Lab Results  Component Value Date   WBC 6.5 04/01/2015   HGB 8.8* 04/01/2015   HCT 27.7* 04/01/2015   MCV 86.0 04/01/2015   PLT 153 04/01/2015    Recent Labs Lab 03/30/15 0805 03/31/15 0452 04/01/15 0615  HGB 9.6* 9.0* 8.8*   Lab Results  Component Value Date   NA 144 04/01/2015   K 3.4* 04/01/2015   CL 113* 04/01/2015   CO2 24 04/01/2015   BUN 34* 04/01/2015   CREATININE 1.90* 04/02/2015   Lab Results  Component Value Date   ALT 23 03/29/2015   AST 35 03/29/2015   ALKPHOS 92 03/29/2015   BILITOT 1.2 03/29/2015    Recent Labs Lab 03/29/15 0955  APTT 35  INR 1.27    Imaging: CLINICAL DATA: 74 year old male diagnosed with pancreatic moderately differentiated  adenocarcinoma last month via endoscopic ultrasound biopsy. status post chemotherapy, radiation, stent placement. Abdominal pain and distension with fever for 2 days. Subsequent encounter.  EXAM: CT ABDOMEN AND PELVIS WITH CONTRAST  TECHNIQUE: Multidetector CT imaging of the abdomen and pelvis was performed using the standard protocol following bolus administration of intravenous contrast.  CONTRAST: 61mL OMNIPAQUE IOHEXOL 300 MG/ML SOLN  COMPARISON: CT Abdomen and Pelvis 02/14/2015 and earlier.  FINDINGS: Stable cardiomegaly. No pericardial effusion. Small layering left pleural effusion is stable. Atelectasis at the lung bases.  Previous sternotomy. No acute or metastatic osseous lesion identified.  Stable small fat containing left inguinal hernia. No pelvic free fluid. Negative urinary bladder. Retained stool throughout the distal colon. Redundant sigmoid with some gaseous distension. Retained stool throughout the left colon, and more proximal colon. Redundant right colon. Normal appendix. Negative terminal ileum. No dilated small bowel. Small sliding-type gastric hiatal hernia. Otherwise negative stomach.  CBD stent in place. Mild gaseous distension of the duodenum which otherwise appears normal. Pneumobilia has not significantly changed. Pancreatic ductal dilatation has not significantly changed. Hypodense lesions at the pancreatic head and neck appears stable (series 2, images 31 and 32). Stable peripancreatic lymph node measuring 11 mm short axis situated between the IVC and main portal vein. Other porta hepatis nodes are stable measuring up to 12 mm short axis.  Liver enhancement and gallbladder are within normal limits (small volume of pneumobilia related gas in the gallbladder). Spleen and adrenal glands within normal limits. Portal venous system is patent. Aortoiliac calcified atherosclerosis noted. Major arterial structures are patent. No other  abdominal or pelvic lymphadenopathy bilateral initial phase renal enhancement is stable and within normal limits. On delayed images there is decreased left renal contrast excretion. The right kidney appears stable. No abdominal free fluid.  IMPRESSION: 1. Largely stable CT appearance of the abdomen since October. CBD stent in place. Hypodense pancreatic head  and neck lesions with surrounding 11-12 mm short axis lymph nodes compatible with pancreatic adenocarcinoma. No distant metastatic disease identified in the abdomen or pelvis. 2. Lack of left renal contrast excretion on delayed images today suggesting a degree of left renal insufficiency.   Electronically Signed  By: Genevie Ann M.D.  On: 03/29/2015 12:27  Assessment/Plan: Mr. Sollars is a 74 y.o. male with recent diagnosis of pancreatic cancer, on chemotherapy and radiation.  WBC on admission 14.8, currently 7.9. LFT's wnl. Albumin 2.8 Patient is currently at radiology for MRCP.  Recommendations: Patient has been discussed with Dr. Rayann Heman and he will perform physical exam and ROS.  Given normal LFT's, unlikely that fever is related to biliary stent. Please see his full note for any further recommendations. Thank you for the consult. Please call with questions or concerns.   Addendum:  Exam:  Filed Vitals:   04/01/15 2149 04/02/15 0457  BP: 157/70 154/74  Pulse: 83 62  Temp: 97.5 F (36.4 C) 97.8 F (36.6 C)  Resp: 18 18   Gen: a and o x 3, + distress, + chronically ill [place]/ at , pharynx clear, EOMI No lad, JVD normal CTA , no w/c, good air entry Rrr, no m/r/g,  + distended and mild diffuse TTP, no r/g, decreased bowel sounds + trace edema bilat, well perf No LAD Skin: no rash or lesions.   ROS:  10 system review conducted.. Negative except for the following: + weakness, + fatigue,  + poor appetite, + early satiety, + nausea, + fever and chills, + SOB, + cough  A/P:   Liver enzymes and imaging suggest  stent is working well. Intra and extra-hepatic ductal dilation is mild.   Doubt fevers related to the stent.More likely due to atalectasis or chemo.  He is in very poor overall health and would not attempt ERCP with stent exchange at this time.

## 2015-04-03 ENCOUNTER — Inpatient Hospital Stay: Payer: Commercial Managed Care - HMO

## 2015-04-03 ENCOUNTER — Ambulatory Visit: Payer: Commercial Managed Care - HMO

## 2015-04-03 ENCOUNTER — Inpatient Hospital Stay: Payer: Commercial Managed Care - HMO | Admitting: Internal Medicine

## 2015-04-03 LAB — GLUCOSE, CAPILLARY
GLUCOSE-CAPILLARY: 411 mg/dL — AB (ref 65–99)
GLUCOSE-CAPILLARY: 450 mg/dL — AB (ref 65–99)
GLUCOSE-CAPILLARY: 444 mg/dL — AB (ref 65–99)
GLUCOSE-CAPILLARY: 349 mg/dL — AB (ref 65–99)
GLUCOSE-CAPILLARY: 353 mg/dL — AB (ref 65–99)
Glucose-Capillary: 443 mg/dL — ABNORMAL HIGH (ref 65–99)
Glucose-Capillary: 347 mg/dL — ABNORMAL HIGH (ref 65–99)

## 2015-04-03 LAB — CULTURE, BLOOD (ROUTINE X 2)
Culture: NO GROWTH
Culture: NO GROWTH

## 2015-04-03 LAB — CREATININE, SERUM
Creatinine, Ser: 1.95 mg/dL — ABNORMAL HIGH (ref 0.61–1.24)
GFR, EST AFRICAN AMERICAN: 37 mL/min — AB (ref >60)
GFR, EST NON AFRICAN AMERICAN: 32 mL/min — AB (ref >60)

## 2015-04-03 MED ORDER — INSULIN GLARGINE 100 UNIT/ML ~~LOC~~ SOLN
50.0000 [IU] | Freq: Two times a day (BID) | SUBCUTANEOUS | Status: DC
  Administered 2015-04-03 – 2015-04-04 (×2): 50 [IU] via SUBCUTANEOUS
  Filled 2015-04-03 (×3): qty 0.5

## 2015-04-03 MED ORDER — METRONIDAZOLE IN NACL 5-0.79 MG/ML-% IV SOLN
500.0000 mg | Freq: Three times a day (TID) | INTRAVENOUS | Status: DC
  Administered 2015-04-03 – 2015-04-06 (×9): 500 mg via INTRAVENOUS
  Filled 2015-04-03 (×12): qty 100

## 2015-04-03 MED ORDER — DEXTROSE 5 % IV SOLN
1.0000 g | INTRAVENOUS | Status: DC
  Administered 2015-04-03 – 2015-04-05 (×3): 1 g via INTRAVENOUS
  Filled 2015-04-03 (×4): qty 10

## 2015-04-03 MED ORDER — LACTULOSE 10 GM/15ML PO SOLN
30.0000 g | Freq: Every day | ORAL | Status: DC
  Administered 2015-04-03 – 2015-04-06 (×2): 30 g via ORAL
  Filled 2015-04-03 (×2): qty 60

## 2015-04-03 MED ORDER — DOCUSATE SODIUM 100 MG PO CAPS
200.0000 mg | ORAL_CAPSULE | Freq: Two times a day (BID) | ORAL | Status: DC
  Administered 2015-04-03 – 2015-04-05 (×5): 200 mg via ORAL
  Filled 2015-04-03 (×5): qty 2

## 2015-04-03 MED ORDER — INSULIN GLARGINE 100 UNIT/ML ~~LOC~~ SOLN
40.0000 [IU] | Freq: Every day | SUBCUTANEOUS | Status: DC
  Administered 2015-04-03: 40 [IU] via SUBCUTANEOUS
  Filled 2015-04-03: qty 0.4

## 2015-04-03 MED ORDER — INSULIN ASPART 100 UNIT/ML ~~LOC~~ SOLN
12.0000 [IU] | Freq: Three times a day (TID) | SUBCUTANEOUS | Status: DC
  Administered 2015-04-03 – 2015-04-05 (×6): 12 [IU] via SUBCUTANEOUS
  Filled 2015-04-03 (×6): qty 12

## 2015-04-03 MED ORDER — INSULIN ASPART 100 UNIT/ML ~~LOC~~ SOLN: 5.0000 [IU] | Freq: Three times a day (TID) | SUBCUTANEOUS | Status: DC

## 2015-04-03 NOTE — Progress Notes (Signed)
[place] INFECTIOUS DISEASE PROGRESS NOTE Date of Admission:  03/29/2015     ID: [name] is a 74 y.o. male with pancreatic cancer and sepsis  Active Problems:   Sepsis ([place])   Fever  Subjective: Fevers improving, had wheezing so cxr done and inconcluisive so strated back on vanco for possible HCAP>  very weak, can barely sit at side of bed.    ROS  Eleven systems are reviewed and negative except per hpi  Medications:  Antibiotics Given (last 72 hours)    Date/Time Action Medication Dose Rate   03/31/15 1822 Given   piperacillin-tazobactam (ZOSYN) IVPB 3.375 g 3.375 g 12.5 mL/hr   04/01/15 0311 Given   piperacillin-tazobactam (ZOSYN) IVPB 3.375 g 3.375 g 12.5 mL/hr   04/01/15 1258 Given   piperacillin-tazobactam (ZOSYN) IVPB 3.375 g 3.375 g 12.5 mL/hr   04/01/15 1908 Given   vancomycin (VANCOCIN) 1,250 mg in sodium chloride 0.9 % 250 mL IVPB 1,250 mg 166.7 mL/hr   04/01/15 2214 Given   piperacillin-tazobactam (ZOSYN) IVPB 3.375 g 3.375 g 12.5 mL/hr   04/02/15 0242 Given   piperacillin-tazobactam (ZOSYN) IVPB 3.375 g 3.375 g 12.5 mL/hr   04/02/15 1144 Given   piperacillin-tazobactam (ZOSYN) IVPB 3.375 g 3.375 g 12.5 mL/hr   04/02/15 1713 Given   vancomycin (VANCOCIN) 1,250 mg in sodium chloride 0.9 % 250 mL IVPB 1,250 mg 166.7 mL/hr   04/02/15 2032 Given   piperacillin-tazobactam (ZOSYN) IVPB 3.375 g 3.375 g 12.5 mL/hr   04/03/15 0237 Given   piperacillin-tazobactam (ZOSYN) IVPB 3.375 g 3.375 g 12.5 mL/hr   04/03/15 1058 Given   piperacillin-tazobactam (ZOSYN) IVPB 3.375 g 3.375 g 12.5 mL/hr     . aspirin EC  81 mg Oral Daily  . docusate sodium  200 mg Oral BID  . gabapentin  300 mg Oral BID  . heparin  5,000 Units Subcutaneous 3 times per day  . insulin aspart  0-15 Units Subcutaneous TID AC & HS  . insulin aspart  12 Units Subcutaneous TID WC  . insulin glargine  50 Units Subcutaneous BID  . lactulose  30 g Oral Daily  . meloxicam  7.5 mg Oral Daily   . methylPREDNISolone (SOLU-MEDROL) injection  60 mg Intravenous Q12H  . pantoprazole  40 mg Oral QAC breakfast  . piperacillin-tazobactam (ZOSYN)  IV  3.375 g Intravenous Q8H  . pravastatin  20 mg Oral q1800  . vancomycin  1,250 mg Intravenous Q24H    Objective: Vital signs in last 24 hours: Temp:  [97.5 F (36.4 C)-97.9 F (36.6 C)] 97.9 F (36.6 C) (12/12 0504) Pulse Rate:  [66-70] 66 (12/12 0504) Resp:  [18] 18 (12/12 0504) BP: (165-167)/(65-69) 167/65 mmHg (12/12 0504) SpO2:  [95 %-97 %] 95 % (12/12 0504) Constitutional: He is oriented to person, place, and time. Obese HENT: [place], eomi, anicteric,  Mouth/Throat: Oropharynx is clear and dry . No oropharyngeal exudate.  Cardiovascular: Normal rate, regular rhythm and normal heart sounds.  Pulmonary/Chest: Effort normal and breath sounds normal. No respiratory distress. He has no wheezes.  Abdominal: Soft. Bowel sounds are normal. He exhibits no distension. Mild ttp RUQ Lymphadenopathy: He has no cervical adenopathy.  Neurological: He is alert and oriented to person, place, and time. Diffusely weak but nonfocal exam Skin: Skin is warm and dry. No rash noted. No erythema.  Psychiatric: He has a normal mood and affect. His behavior is normal.  Ext LLE with 1+ edema  Lab Results  Recent Labs  04/01/15  0615 04/02/15 0540 04/03/15 0903  WBC 6.5  --   --   HGB 8.8*  --   --   HCT 27.7*  --   --   NA 144  --   --   K 3.4*  --   --   CL 113*  --   --   CO2 24  --   --   BUN 34*  --   --   CREATININE 2.10* 1.90* 1.95*    Microbiology: Results for orders placed or performed during the hospital encounter of 03/29/15  Urine culture     Status: None   Collection Time: 03/29/15  9:55 AM  Result Value Ref Range Status   Specimen Description URINE, RANDOM  Final   Special Requests NONE  Final   Culture 1,000 COLONIES/mL INSIGNIFICANT GROWTH   Final   Report Status 03/30/2015 FINAL  Final  Blood Culture (routine x  2)     Status: None   Collection Time: 03/29/15 10:00 AM  Result Value Ref Range Status   Specimen Description BLOOD LEFT HAND  Final   Special Requests BOTTLES DRAWN AEROBIC AND ANAEROBIC  11 CC  Final   Culture NO GROWTH 5 DAYS  Final   Report Status 04/03/2015 FINAL  Final  Blood Culture (routine x 2)     Status: None   Collection Time: 03/29/15 10:08 AM  Result Value Ref Range Status   Specimen Description BLOOD LEFT ARM  Final   Special Requests   Final    BOTTLES DRAWN AEROBIC AND ANAEROBIC  1CC ANAERO 3CC AERO   Culture NO GROWTH 5 DAYS  Final   Report Status 04/03/2015 FINAL  Final    Studies/Results: Ct Abdomen Pelvis W Contrast  03/29/2015  CLINICAL DATA:  74 year old male diagnosed with pancreatic moderately differentiated adenocarcinoma last month via endoscopic ultrasound biopsy. status post chemotherapy, radiation, stent placement. Abdominal pain and distension with fever for 2 days. Subsequent encounter. EXAM: CT ABDOMEN AND PELVIS WITH CONTRAST TECHNIQUE: Multidetector CT imaging of the abdomen and pelvis was performed using the standard protocol following bolus administration of intravenous contrast. CONTRAST:  33mL OMNIPAQUE IOHEXOL 300 MG/ML  SOLN COMPARISON:  CT Abdomen and Pelvis 02/14/2015 and earlier. FINDINGS: Stable cardiomegaly. No pericardial effusion. Small layering left pleural effusion is stable. Atelectasis at the lung bases. Previous sternotomy. No acute or metastatic osseous lesion identified. Stable small fat containing left inguinal hernia. No pelvic free fluid. Negative urinary bladder. Retained stool throughout the distal colon. Redundant sigmoid with some gaseous distension. Retained stool throughout the left colon, and more proximal colon. Redundant right colon. Normal appendix. Negative terminal ileum. No dilated small bowel. Small sliding-type gastric hiatal hernia. Otherwise negative stomach. CBD stent in place. Mild gaseous distension of the duodenum  which otherwise appears normal. Pneumobilia has not significantly changed. Pancreatic ductal dilatation has not significantly changed. Hypodense lesions at the pancreatic head and neck appears stable (series 2, images 31 and 32). Stable peripancreatic lymph node measuring 11 mm short axis situated between the IVC and main portal vein. Other porta hepatis nodes are stable measuring up to 12 mm short axis. Liver enhancement and gallbladder are within normal limits (small volume of pneumobilia related gas in the gallbladder). Spleen and adrenal glands within normal limits. Portal venous system is patent. Aortoiliac calcified atherosclerosis noted. Major arterial structures are patent. No other abdominal or pelvic lymphadenopathy bilateral initial phase renal enhancement is stable and within normal limits. On delayed images there is decreased  left renal contrast excretion. The right kidney appears stable. No abdominal free fluid. IMPRESSION: 1. Largely stable CT appearance of the abdomen since October. CBD stent in place. Hypodense pancreatic head and neck lesions with surrounding 11-12 mm short axis lymph nodes compatible with pancreatic adenocarcinoma. No distant metastatic disease identified in the abdomen or pelvis. 2. Lack of left renal contrast excretion on delayed images today suggesting a degree of left renal insufficiency. Electronically Signed   By: Genevie Ann M.D.   On: 03/29/2015 12:27   US Venous Img Lower Bilateral  03/31/2015  CLINICAL DATA:  Swelling. EXAM: BILATERAL LOWER EXTREMITY VENOUS DOPPLER ULTRASOUND TECHNIQUE: Gray-scale sonography with graded compression, as well as color Doppler and duplex ultrasound were performed to evaluate the lower extremity deep venous systems from the level of the common femoral vein and including the common femoral, femoral, profunda femoral, popliteal and calf veins including the posterior tibial, peroneal and gastrocnemius veins when visible. The superficial great  saphenous vein was also interrogated. Spectral Doppler was utilized to evaluate flow at rest and with distal augmentation maneuvers in the common femoral, femoral and popliteal veins. COMPARISON:  None. FINDINGS: RIGHT LOWER EXTREMITY Common Femoral Vein: No evidence of thrombus. Normal compressibility, respiratory phasicity and response to augmentation. Saphenofemoral Junction: No evidence of thrombus. Normal compressibility and flow on color Doppler imaging. Profunda Femoral Vein: No evidence of thrombus. Normal compressibility and flow on color Doppler imaging. Femoral Vein: No evidence of thrombus. Normal compressibility, respiratory phasicity and response to augmentation. Popliteal Vein: No evidence of thrombus. Normal compressibility, respiratory phasicity and response to augmentation. Calf Veins: No evidence of thrombus. Normal compressibility and flow on color Doppler imaging. Superficial Great Saphenous Vein: No evidence of thrombus. Normal compressibility and flow on color Doppler imaging. Other Findings:  None. LEFT LOWER EXTREMITY Common Femoral Vein: No evidence of thrombus. Normal compressibility, respiratory phasicity and response to augmentation. Saphenofemoral Junction: No evidence of thrombus. Normal compressibility and flow on color Doppler imaging. Profunda Femoral Vein: No evidence of thrombus. Normal compressibility and flow on color Doppler imaging. Femoral Vein: No evidence of thrombus. Normal compressibility, respiratory phasicity and response to augmentation. Popliteal Vein: No evidence of thrombus. Normal compressibility, respiratory phasicity and response to augmentation. Calf Veins: No evidence of thrombus. Normal compressibility and flow on color Doppler imaging. Superficial Great Saphenous Vein: No evidence of thrombus. Normal compressibility and flow on color Doppler imaging. Other Findings:  None. IMPRESSION: No evidence of deep venous thrombosis. Electronically Signed   By: Marcello Moores   Register   On: 03/31/2015 14:20   Dg Chest Port 1 View  04/01/2015  CLINICAL DATA:  74 year old with current history of pancreatic cancer, sepsis, and persistent fevers. EXAM: PORTABLE CHEST 1 VIEW COMPARISON:  03/29/2015 and earlier, including CT chest 02/25/2015. FINDINGS: Prior sternotomy for CABG. Cardiac silhouette moderately enlarged, unchanged. Pulmonary vascularity normal without evidence of pulmonary edema. Suboptimal inspiration with atelectasis in the lung bases, left greater than right, increased since the examination 3 days ago. No new pulmonary parenchymal abnormalities elsewhere. IMPRESSION: 1. Suboptimal inspiration accounts for bibasilar atelectasis, left greater than right, worse than the examination 3 days ago. 2. Stable cardiomegaly without pulmonary edema. 3. No new abnormalities elsewhere. Electronically Signed   By: Evangeline Dakin M.D.   On: 04/01/2015 17:04   Dg Chest Port 1 View  03/29/2015  CLINICAL DATA:  Fever.  Pancreatic cancer. EXAM: PORTABLE CHEST 1 VIEW COMPARISON:  Chest radiograph 02/25/2015.  CT chest 02/25/2015. FINDINGS: The heart is enlarged. There is  prior CABG. Thoracic atherosclerosis. Low lung volumes. No definite active infiltrates or failure. No osseous findings. IMPRESSION: Low lung volumes. No definite active infiltrates or failure. Cardiomegaly. Electronically Signed   By: Staci Righter M.D.   On: 03/29/2015 10:39   Mr [name] Cm/mrcp  04/01/2015  CLINICAL DATA:  Pancreatic adenocarcinoma. EXAM: MRI ABDOMEN WITHOUT CONTRAST  (INCLUDING MRCP) TECHNIQUE: Multiplanar multisequence MR imaging of the abdomen was performed. Heavily T2-weighted images of the biliary and pancreatic ducts were obtained, and three-dimensional MRCP images were rendered by post processing. COMPARISON:  03/29/2015 FINDINGS: Images significantly degraded by motion artifact from breathing. Lower chest:  Small left pleural effusion.  Cardiomegaly. Hepatobiliary: Mild intrahepatic biliary  dilatation with the common bile duct measuring 1.6 cm. There could be some gas in the common bile duct. The CBD seems severely narrowed in the vicinity of the pancreatic head, with apparent fluid in the thin central lumen of the stent on images 20-25 of series 10 extending through this region. The stent itself is much harder to see on MR than on prior CT but is thought to still be present. No obvious enhancing hepatic mass seen, subject to reduce sensitivity from motion degradation of images. I am uncertain whether the apparent linear filling defects in the right and left portal veins are due to thrombus or artifact. Numerous tiny gallstones are present in the gallbladder. Pancreas: Dilated dorsal pancreatic duct with dilated side ducts and truncation in the pancreatic head again observed. Aside from the truncation, I do not see obvious differential enhancement in the pancreatic head to further signify the presence of the mass, although there is some soft tissue fullness in this vicinity. Spleen: Unremarkable Adrenals/Urinary Tract: Right mid kidney cyst measures about 8 mm diameter. Adrenal glands unremarkable. Stomach/Bowel: Unremarkable Vascular/Lymphatic: Known abdominal aortic atherosclerosis. A porta hepatis node measures 1.3 cm in short axis on image 18 series 7. Other: No supplemental non-categorized findings. Musculoskeletal: Unremarkable IMPRESSION: 1. Questionable linear filling defects in the right and left portal veins intrahepatic portions. Although quite likely from motion artifact, the appearance makes it difficult to confidently exclude the possibility of early portal vein thrombosis. Consider vascular ultrasound of the liver for further characterization. 2. Narrowing of the biliary tree in the vicinity of the pancreatic head. In this area, only the thing in the lumen of the stent is visualized as continuous. There is mild intrahepatic biliary dilatation and moderate extrahepatic biliary dilatation.  There is likely also some pneumobilia. 3. Numerous tiny gallstones in the gallbladder. 4. The pancreatic head mass is mainly observable due to truncation of the dilated dorsal pancreatic duct. Differential enhancement in this vicinity is not well seen. 5. Mild porta hepatis adenopathy. 6. Small left pleural effusion with cardiomegaly. 7. Images are degraded by motion artifact. Electronically Signed   By: Van Clines M.D.   On: 04/01/2015 11:03   Assessment/Plan: [name] is a 74 y.o. male with recently dxed pancreatic cancer s/p recent EUS and ERCP admitted Nov 5-9 with with sepsis and found to have Klebsiella Pna bacteremia. He was discharged on oral keflex for a 10 day course with thought that the likely source is his GI tract given pancreatic cancer and prior procedures. Readmitted 12/7 with Fever and weakness. He had recently started chemo with gemcitabine. On admit temp 102. Wbc 14.8. Has been started on vanco and zosyn. Ct scan abd pelvis shows no major change, no obstruction but does have continued pneumobilia . CXR negative. LFTs nml. UA neg. Not  neutropenic.  No obvious source of fever, leukocytosis. He also had diffuse weakness but no focal findings. I suspect he has a biliary source again given stent in place, pneumobilia.  Afebrile since 12/9 on vanco and zosyn (vanco added back for possible PNA)  Recommendations Can dc Vanco  Change abx to ceftriaxone and flagyl - will change to oral kelfex and flagyl at dc Thank you very much for allowing me to participate in the care of this patient. Please call with questions.   [place], [place]   04/03/2015, 4:35 PM

## 2015-04-03 NOTE — Care Management Important Message (Signed)
Important Message  Patient Details  Name: [name] MRN: [id number] Date of Birth: [birth year]   Medicare Important Message Given:  Yes    Juliann Pulse A Rip Hawes 04/03/2015, 11:29 AM

## 2015-04-03 NOTE — Progress Notes (Signed)
ANTIBIOTIC CONSULT NOTE - INITIAL  Pharmacy Consult for Ceftriaxone  Indication: intra abdominal infection  Allergies  Allergen Reactions  . Zithromax [Azithromycin] Anaphylaxis and Other (See Comments)    Reaction:  Stroke-like symptoms   . Lisinopril Other (See Comments)    Per spouse, pt did not tolerate well in the past.      Patient Measurements: Height: 5\' 8"  (172.7 cm) Weight: 207 lb 10.8 oz (94.2 kg) IBW/kg (Calculated) : 68.4 Adjusted Body Weight:   Vital Signs:   Intake/Output from previous day: 12/11 0701 - 12/12 0700 In: 720 [P.O.:720] Out: -  Intake/Output from this shift: Total I/O In: 480 [P.O.:480] Out: -   Labs:  Recent Labs  04/01/15 0615 04/02/15 0540 04/03/15 0903  WBC 6.5  --   --   HGB 8.8*  --   --   PLT 153  --   --   CREATININE 2.10* 1.90* 1.95*   Estimated Creatinine Clearance: 37 mL/min (by C-G formula based on Cr of 1.95). No results for input(s): VANCOTROUGH, VANCOPEAK, VANCORANDOM, GENTTROUGH, GENTPEAK, GENTRANDOM, TOBRATROUGH, TOBRAPEAK, TOBRARND, AMIKACINPEAK, AMIKACINTROU, AMIKACIN in the last 72 hours.   Microbiology: Recent Results (from the past 720 hour(s))  Urine culture     Status: None   Collection Time: 03/29/15  9:55 AM  Result Value Ref Range Status   Specimen Description URINE, RANDOM  Final   Special Requests NONE  Final   Culture 1,000 COLONIES/mL INSIGNIFICANT GROWTH   Final   Report Status 03/30/2015 FINAL  Final  Blood Culture (routine x 2)     Status: None   Collection Time: 03/29/15 10:00 AM  Result Value Ref Range Status   Specimen Description BLOOD LEFT HAND  Final   Special Requests BOTTLES DRAWN AEROBIC AND ANAEROBIC  11 CC  Final   Culture NO GROWTH 5 DAYS  Final   Report Status 04/03/2015 FINAL  Final  Blood Culture (routine x 2)     Status: None   Collection Time: 03/29/15 10:08 AM  Result Value Ref Range Status   Specimen Description BLOOD LEFT ARM  Final   Special Requests   Final     BOTTLES DRAWN AEROBIC AND ANAEROBIC  1CC ANAERO 3CC AERO   Culture NO GROWTH 5 DAYS  Final   Report Status 04/03/2015 FINAL  Final    Medical History: Past Medical History  Diagnosis Date  . Hyperlipidemia   . H/O cardiac catheterization 06/03/06,12/03/11  . BPH (benign prostatic hypertrophy)   . Anxiety   . Syncope     LAUGHING INDUCED  . Myocardial infarction ([place])     x 2  . CHF (congestive heart failure) ([place])   . CAD (coronary artery disease) 06/03/06    cypher 2.5 x 51mm DES for 80% mid RCA sees Dr. Ubaldo Glassing  . Asthma     as a child  . Sleep apnea     hx of  . Kidney stone     hx of  . GERD (gastroesophageal reflux disease)     on medication for  . Cancer ([place])     hx of skin ca in "corner of left eye"  . Arthritis   . Anemia     taking iron  . Cataract, bilateral   . Neuromuscular disorder ([place])     diabetic neuropathy  . Diabetes mellitus without complication ([place])   . Pancreatic adenocarcinoma ([facility])     Medications:  Scheduled:  . aspirin EC  81 mg Oral Daily  .  cefTRIAXone (ROCEPHIN)  IV  1 g Intravenous Q24H  . docusate sodium  200 mg Oral BID  . gabapentin  300 mg Oral BID  . heparin  5,000 Units Subcutaneous 3 times per day  . insulin aspart  0-15 Units Subcutaneous TID AC & HS  . insulin aspart  12 Units Subcutaneous TID WC  . insulin glargine  50 Units Subcutaneous BID  . lactulose  30 g Oral Daily  . meloxicam  7.5 mg Oral Daily  . methylPREDNISolone (SOLU-MEDROL) injection  60 mg Intravenous Q12H  . metronidazole  500 mg Intravenous Q8H  . pantoprazole  40 mg Oral QAC breakfast  . pravastatin  20 mg Oral q1800   Assessment: CrCl = 37 ml/min Pharmacy consulted to dose ceftriaxone in this 74 year old male admitted with sepsis, intra abdominal infection.  Goal of Therapy:  resolution of infection  Plan:  Expected duration 7 days with resolution of temperature and/or normalization of WBC   Will start ceftriaxone 1 gm IV Q24H on 12/12.    Elaria Osias D 04/03/2015,5:33 PM

## 2015-04-03 NOTE — Progress Notes (Signed)
Dr. Vianne Bulls made aware of pt's CBG of 411. Pt to receive 15 units SSI per Dr. Vianne Bulls. Clarise Cruz, RN

## 2015-04-03 NOTE — Progress Notes (Signed)
[place] at [facility]                                                                                                                                                                                            Patient Demographics   [name], is a 74 y.o. male, DOB - Mar 07, 1941, [id number]  Admit date - 03/29/2015   Admitting Physician Vaughan Basta, MD  Outpatient Primary MD for the patient is Rusty Aus., MD   LOS - 5  Subjective: No further fevers. Wheezing resolved. Has constipation issues.  Blood glucose in 400 range.  Review of Systems:   CONSTITUTIONAL:  , fatigue present. EYES: No blurry or double vision.  ENT: No tinnitus. No postnasal drip. No redness of the oropharynx.  RESPIRATORY: No wheezing, no shortness of breath CARDIOVASCULAR: No chest pain. No orthopnea. No palpitations. No syncope.  GASTROINTESTINAL:Complains of constipation, abdominal pain.  GENITOURINARY: No dysuria or hematuria.  ENDOCRINE: No polyuria or nocturia. No heat or cold intolerance.  HEMATOLOGY: No anemia. No bruising. No bleeding.  INTEGUMENTARY: No rashes. No lesions.  MUSCULOSKELETAL: No arthritis. No swelling. No gout.  NEUROLOGIC: No numbness, tingling, or ataxia. No seizure-type activity.  PSYCHIATRIC: No anxiety. No insomnia. No ADD.    Vitals:   Filed Vitals:   04/02/15 1327 04/02/15 2059 04/03/15 0251 04/03/15 0504  BP: 146/82 165/69  167/65  Pulse: 78 70  66  Temp: 97.7 F (36.5 C) 97.5 F (36.4 C)  97.9 F (36.6 C)  TempSrc: Oral Oral  Oral  Resp: 20 18  18   Height:      Weight:      SpO2: 97% 97% 95% 95%    Wt Readings from Last 3 Encounters:  03/29/15 94.2 kg (207 lb 10.8 oz)  03/23/15 92.4 kg (203 lb 11.3 oz)  03/09/15 93.35 kg (205 lb 12.8 oz)     Intake/Output Summary (Last 24 hours) at 04/03/15 [id number] Last data filed at 04/02/15 1800  Gross per 24 hour  Intake    480 ml  Output      0 ml  Net    480  ml    Physical Exam:   GENERAL: Pleasant-appearing in no apparent distress. Chronically ill-appearing male HEAD, EYES, EARS, NOSE AND THROAT: Atraumatic, normocephalic. Extraocular muscles are intact. Pupils equal and reactive to light. Sclerae anicteric. No conjunctival injection. No oro-pharyngeal erythema.  NECK: Supple. There is no jugular venous distention. No bruits, no lymphadenopathy, no thyromegaly.  HEART: Regular rate and rhythm,. No murmurs, no rubs, no clicks.  LUNGS: clear To auscultation no wheezing  ABDOMEN: Soft, flat,  mild epigastric tenderness, nondistended. Has good bowel sounds. No hepatosplenomegaly appreciated.  EXTREMITIES: No evidence of any cyanosis, clubbing, or peripheral edema.  +2 pedal and radial pulses bilaterally.  NEUROLOGIC: The patient is alert, awake, and oriented x3 with no focal motor or sensory deficits appreciated bilaterally.  SKIN: Moist and warm with no rashes appreciated.  Psych: Not anxious, depressed LN: No inguinal LN enlargement    Antibiotics   Anti-infectives    Start     Dose/Rate Route Frequency Ordered Stop   04/01/15 1800  vancomycin (VANCOCIN) 1,250 mg in sodium chloride 0.9 % 250 mL IVPB     1,250 mg 166.7 mL/hr over 90 Minutes Intravenous Every 24 hours 04/01/15 1733     04/01/15 1730  vancomycin (VANCOCIN) IVPB 1000 mg/200 mL premix  Status:  Discontinued     1,000 mg 200 mL/hr over 60 Minutes Intravenous  Once 04/01/15 1721 04/01/15 1733   03/29/15 2200  vancomycin (VANCOCIN) IVPB 1000 mg/200 mL premix  Status:  Discontinued     1,000 mg 200 mL/hr over 60 Minutes Intravenous Every 18 hours 03/29/15 1324 03/30/15 1518   03/29/15 1900  piperacillin-tazobactam (ZOSYN) IVPB 3.375 g     3.375 g 12.5 mL/hr over 240 Minutes Intravenous Every 8 hours 03/29/15 1324     03/29/15 1000  piperacillin-tazobactam (ZOSYN) IVPB 3.375 g     3.375 g 100 mL/hr over 30 Minutes Intravenous  Once 03/29/15 0949 03/29/15 1340   03/29/15 1000   vancomycin (VANCOCIN) 1,500 mg in sodium chloride 0.9 % 500 mL IVPB     1,500 mg 250 mL/hr over 120 Minutes Intravenous  Once 03/29/15 0949 03/29/15 1238      Medications   Scheduled Meds: . aspirin EC  81 mg Oral Daily  . gabapentin  300 mg Oral BID  . heparin  5,000 Units Subcutaneous 3 times per day  . insulin aspart  0-15 Units Subcutaneous TID AC & HS  . insulin aspart  5 Units Subcutaneous TID WC  . insulin glargine  40 Units Subcutaneous Daily  . meloxicam  7.5 mg Oral Daily  . methylPREDNISolone (SOLU-MEDROL) injection  60 mg Intravenous Q12H  . pantoprazole  40 mg Oral QAC breakfast  . piperacillin-tazobactam (ZOSYN)  IV  3.375 g Intravenous Q8H  . pravastatin  20 mg Oral q1800  . vancomycin  1,250 mg Intravenous Q24H   Continuous Infusions:   PRN Meds:.acetaminophen, HYDROmorphone (DILAUDID) injection, ipratropium-albuterol, ondansetron, oxyCODONE-acetaminophen, polyethylene glycol powder   Data Review:   Micro Results Recent Results (from the past 240 hour(s))  Urine culture     Status: None   Collection Time: 03/29/15  9:55 AM  Result Value Ref Range Status   Specimen Description URINE, RANDOM  Final   Special Requests NONE  Final   Culture 1,000 COLONIES/mL INSIGNIFICANT GROWTH   Final   Report Status 03/30/2015 FINAL  Final  Blood Culture (routine x 2)     Status: None (Preliminary result)   Collection Time: 03/29/15 10:00 AM  Result Value Ref Range Status   Specimen Description BLOOD LEFT HAND  Final   Special Requests BOTTLES DRAWN AEROBIC AND ANAEROBIC  11 CC  Final   Culture NO GROWTH 4 DAYS  Final   Report Status PENDING  Incomplete  Blood Culture (routine x 2)     Status: None (Preliminary result)   Collection Time: 03/29/15 10:08 AM  Result Value Ref Range Status   Specimen Description BLOOD LEFT ARM  Final   Special  Requests   Final    BOTTLES DRAWN AEROBIC AND ANAEROBIC  1CC ANAERO 3CC AERO   Culture NO GROWTH 4 DAYS  Final   Report Status  PENDING  Incomplete    Radiology Reports Ct Abdomen Pelvis W Contrast  03/29/2015  CLINICAL DATA:  74 year old male diagnosed with pancreatic moderately differentiated adenocarcinoma last month via endoscopic ultrasound biopsy. status post chemotherapy, radiation, stent placement. Abdominal pain and distension with fever for 2 days. Subsequent encounter. EXAM: CT ABDOMEN AND PELVIS WITH CONTRAST TECHNIQUE: Multidetector CT imaging of the abdomen and pelvis was performed using the standard protocol following bolus administration of intravenous contrast. CONTRAST:  70mL OMNIPAQUE IOHEXOL 300 MG/ML  SOLN COMPARISON:  CT Abdomen and Pelvis 02/14/2015 and earlier. FINDINGS: Stable cardiomegaly. No pericardial effusion. Small layering left pleural effusion is stable. Atelectasis at the lung bases. Previous sternotomy. No acute or metastatic osseous lesion identified. Stable small fat containing left inguinal hernia. No pelvic free fluid. Negative urinary bladder. Retained stool throughout the distal colon. Redundant sigmoid with some gaseous distension. Retained stool throughout the left colon, and more proximal colon. Redundant right colon. Normal appendix. Negative terminal ileum. No dilated small bowel. Small sliding-type gastric hiatal hernia. Otherwise negative stomach. CBD stent in place. Mild gaseous distension of the duodenum which otherwise appears normal. Pneumobilia has not significantly changed. Pancreatic ductal dilatation has not significantly changed. Hypodense lesions at the pancreatic head and neck appears stable (series 2, images 31 and 32). Stable peripancreatic lymph node measuring 11 mm short axis situated between the IVC and main portal vein. Other porta hepatis nodes are stable measuring up to 12 mm short axis. Liver enhancement and gallbladder are within normal limits (small volume of pneumobilia related gas in the gallbladder). Spleen and adrenal glands within normal limits. Portal venous  system is patent. Aortoiliac calcified atherosclerosis noted. Major arterial structures are patent. No other abdominal or pelvic lymphadenopathy bilateral initial phase renal enhancement is stable and within normal limits. On delayed images there is decreased left renal contrast excretion. The right kidney appears stable. No abdominal free fluid. IMPRESSION: 1. Largely stable CT appearance of the abdomen since October. CBD stent in place. Hypodense pancreatic head and neck lesions with surrounding 11-12 mm short axis lymph nodes compatible with pancreatic adenocarcinoma. No distant metastatic disease identified in the abdomen or pelvis. 2. Lack of left renal contrast excretion on delayed images today suggesting a degree of left renal insufficiency. Electronically Signed   By: Genevie Ann M.D.   On: 03/29/2015 12:27   US Venous Img Lower Bilateral  03/31/2015  CLINICAL DATA:  Swelling. EXAM: BILATERAL LOWER EXTREMITY VENOUS DOPPLER ULTRASOUND TECHNIQUE: Gray-scale sonography with graded compression, as well as color Doppler and duplex ultrasound were performed to evaluate the lower extremity deep venous systems from the level of the common femoral vein and including the common femoral, femoral, profunda femoral, popliteal and calf veins including the posterior tibial, peroneal and gastrocnemius veins when visible. The superficial great saphenous vein was also interrogated. Spectral Doppler was utilized to evaluate flow at rest and with distal augmentation maneuvers in the common femoral, femoral and popliteal veins. COMPARISON:  None. FINDINGS: RIGHT LOWER EXTREMITY Common Femoral Vein: No evidence of thrombus. Normal compressibility, respiratory phasicity and response to augmentation. Saphenofemoral Junction: No evidence of thrombus. Normal compressibility and flow on color Doppler imaging. Profunda Femoral Vein: No evidence of thrombus. Normal compressibility and flow on color Doppler imaging. Femoral Vein: No  evidence of thrombus. Normal compressibility, respiratory phasicity and  response to augmentation. Popliteal Vein: No evidence of thrombus. Normal compressibility, respiratory phasicity and response to augmentation. Calf Veins: No evidence of thrombus. Normal compressibility and flow on color Doppler imaging. Superficial Great Saphenous Vein: No evidence of thrombus. Normal compressibility and flow on color Doppler imaging. Other Findings:  None. LEFT LOWER EXTREMITY Common Femoral Vein: No evidence of thrombus. Normal compressibility, respiratory phasicity and response to augmentation. Saphenofemoral Junction: No evidence of thrombus. Normal compressibility and flow on color Doppler imaging. Profunda Femoral Vein: No evidence of thrombus. Normal compressibility and flow on color Doppler imaging. Femoral Vein: No evidence of thrombus. Normal compressibility, respiratory phasicity and response to augmentation. Popliteal Vein: No evidence of thrombus. Normal compressibility, respiratory phasicity and response to augmentation. Calf Veins: No evidence of thrombus. Normal compressibility and flow on color Doppler imaging. Superficial Great Saphenous Vein: No evidence of thrombus. Normal compressibility and flow on color Doppler imaging. Other Findings:  None. IMPRESSION: No evidence of deep venous thrombosis. Electronically Signed   By: Marcello Moores  Register   On: 03/31/2015 14:20   Dg Chest Port 1 View  04/01/2015  CLINICAL DATA:  74 year old with current history of pancreatic cancer, sepsis, and persistent fevers. EXAM: PORTABLE CHEST 1 VIEW COMPARISON:  03/29/2015 and earlier, including CT chest 02/25/2015. FINDINGS: Prior sternotomy for CABG. Cardiac silhouette moderately enlarged, unchanged. Pulmonary vascularity normal without evidence of pulmonary edema. Suboptimal inspiration with atelectasis in the lung bases, left greater than right, increased since the examination 3 days ago. No new pulmonary parenchymal  abnormalities elsewhere. IMPRESSION: 1. Suboptimal inspiration accounts for bibasilar atelectasis, left greater than right, worse than the examination 3 days ago. 2. Stable cardiomegaly without pulmonary edema. 3. No new abnormalities elsewhere. Electronically Signed   By: Evangeline Dakin M.D.   On: 04/01/2015 17:04   Dg Chest Port 1 View  03/29/2015  CLINICAL DATA:  Fever.  Pancreatic cancer. EXAM: PORTABLE CHEST 1 VIEW COMPARISON:  Chest radiograph 02/25/2015.  CT chest 02/25/2015. FINDINGS: The heart is enlarged. 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 Thoracic atherosclerosis. Low lung volumes. No definite active infiltrates or failure. No osseous findings. IMPRESSION: Low lung volumes. No definite active infiltrates or failure. Cardiomegaly. Electronically Signed   By: Staci Righter M.D.   On: 03/29/2015 10:39   Mr [name] Cm/mrcp  04/01/2015  CLINICAL DATA:  Pancreatic adenocarcinoma. EXAM: MRI ABDOMEN WITHOUT CONTRAST  (INCLUDING MRCP) TECHNIQUE: Multiplanar multisequence MR imaging of the abdomen was performed. Heavily T2-weighted images of the biliary and pancreatic ducts were obtained, and three-dimensional MRCP images were rendered by post processing. COMPARISON:  03/29/2015 FINDINGS: Images significantly degraded by motion artifact from breathing. Lower chest:  Small left pleural effusion.  Cardiomegaly. Hepatobiliary: Mild intrahepatic biliary dilatation with the common bile duct measuring 1.6 cm. There could be some gas in the common bile duct. The CBD seems severely narrowed in the vicinity of the pancreatic head, with apparent fluid in the thin central lumen of the stent on images 20-25 of series 10 extending through this region. The stent itself is much harder to see on MR than on prior CT but is thought to still be present. No obvious enhancing hepatic mass seen, subject to reduce sensitivity from motion degradation of images. I am uncertain whether the apparent linear filling defects in the right and left  portal veins are due to thrombus or artifact. Numerous tiny gallstones are present in the gallbladder. Pancreas: Dilated dorsal pancreatic duct with dilated side ducts and truncation in the pancreatic head again observed. Aside from  the truncation, I do not see obvious differential enhancement in the pancreatic head to further signify the presence of the mass, although there is some soft tissue fullness in this vicinity. Spleen: Unremarkable Adrenals/Urinary Tract: Right mid kidney cyst measures about 8 mm diameter. Adrenal glands unremarkable. Stomach/Bowel: Unremarkable Vascular/Lymphatic: Known abdominal aortic atherosclerosis. A porta hepatis node measures 1.3 cm in short axis on image 18 series 7. Other: No supplemental non-categorized findings. Musculoskeletal: Unremarkable IMPRESSION: 1. Questionable linear filling defects in the right and left portal veins intrahepatic portions. Although quite likely from motion artifact, the appearance makes it difficult to confidently exclude the possibility of early portal vein thrombosis. Consider vascular ultrasound of the liver for further characterization. 2. Narrowing of the biliary tree in the vicinity of the pancreatic head. In this area, only the thing in the lumen of the stent is visualized as continuous. There is mild intrahepatic biliary dilatation and moderate extrahepatic biliary dilatation. There is likely also some pneumobilia. 3. Numerous tiny gallstones in the gallbladder. 4. The pancreatic head mass is mainly observable due to truncation of the dilated dorsal pancreatic duct. Differential enhancement in this vicinity is not well seen. 5. Mild porta hepatis adenopathy. 6. Small left pleural effusion with cardiomegaly. 7. Images are degraded by motion artifact. Electronically Signed   By: Van Clines M.D.   On: 04/01/2015 11:03     CBC  Recent Labs Lab 03/29/15 0955 03/30/15 0805 03/31/15 0452 04/01/15 0615  WBC 14.8* 11.4* 7.9 6.5  HGB  10.3* 9.6* 9.0* 8.8*  HCT 32.5* 30.7* 28.5* 27.7*  PLT 187 149* 162 153  MCV 85.0 86.3 86.6 86.0  MCH 26.9 27.0 27.4 27.3  MCHC 31.7* 31.2* 31.6* 31.8*  RDW 16.0* 16.0* 15.7* 15.4*  LYMPHSABS 1.1  --   --   --   MONOABS 0.1*  --   --   --   EOSABS 0.0  --   --   --   BASOSABS 0.1  --   --   --     Chemistries   Recent Labs Lab 03/29/15 0955 03/30/15 0805 03/31/15 0452 04/01/15 0615 04/02/15 0540  NA 139 142 143 144  --   K 3.8 4.3 3.9 3.4*  --   CL 109 111 111 113*  --   CO2 22 23 25 24   --   GLUCOSE 206* 299* 352* 206*  --   BUN 31* 32* 37* 34*  --   CREATININE 1.69* 1.87* 2.16* 2.10* 1.90*  CALCIUM 8.3* 8.3* 8.2* 8.2*  --   MG 1.2*  --   --   --   --   AST 35  --   --   --   --   ALT 23  --   --   --   --   ALKPHOS 92  --   --   --   --   BILITOT 1.2  --   --   --   --    ------------------------------------------------------------------------------------------------------------------ estimated creatinine clearance is 38 mL/min (by C-G formula based on Cr of 1.9). ------------------------------------------------------------------------------------------------------------------ No results for input(s): HGBA1C in the last 72 hours. ------------------------------------------------------------------------------------------------------------------ No results for input(s): CHOL, HDL, LDLCALC, TRIG, CHOLHDL, LDLDIRECT in the last 72 hours. ------------------------------------------------------------------------------------------------------------------ No results for input(s): TSH, T4TOTAL, T3FREE, THYROIDAB in the last 72 hours.  Invalid input(s): FREET3 ------------------------------------------------------------------------------------------------------------------ No results for input(s): VITAMINB12, FOLATE, FERRITIN, TIBC, IRON, RETICCTPCT in the last 72 hours.  Coagulation profile  Recent Labs Lab 03/29/15 0955  INR  1.27    No results for input(s): DDIMER in  the last 72 hours.  Cardiac Enzymes  Recent Labs Lab 03/29/15 0955  TROPONINI 0.06*   ------------------------------------------------------------------------------------------------------------------ Invalid input(s): POCBNP    Assessment & Plan   * Sepsis due to pneumobilia: Bilateral pneumonia: Continue vancomycin, Zosyn. Vancomycin started yesterday secondary to wheezing, pneumonia found on x-ray to cover for MRSA. Patient has no fevers. Breathing is better. No wheezing today. Seen by GI. Thinks that stent is in place and no further GI workup is planned Given normal LFT's, unlikely that fever is related to biliary stent.  Discussed  with the patient.* Adenocarcinoma of the pancreas Follows with cancer Center, recently started on chemotherapy and radiation. Prognosis poor. In by oncology. According to family he is not a candidate for further chemotherapy at this time.  * Diabetes; Hyperglycemia secondary to steroid use: Added mealtime coverage, increase the Lantus, continue sliding-scale coverage. * History of coronary artery disease Continue statin and aspirin,   *Acute renal failure on chronic renal failure chronic kidney disease stage III:  Improved  renal function. Baseline creatinine 1.58. Monitor kidney function closely. Received IV hydration.  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   SOB and wheezing site. Due  to bilateral pneumonia: Improving with antibiotics, nebulizers, steroids.   Deconditioning: Physical therapy recommends SNF/STR placement.  As to patient:  D/w with the wife.    Code Status Orders        Start     Ordered   03/29/15 1527  Full code   Continuous     03/29/15 1526    Advance Directive Documentation        Most Recent Value   Type of Advance Directive  Healthcare Power of Attorney, Living will   Pre-existing out of facility DNR order (yellow form or pink MOST form)      "MOST" Form in Place?             Consults  infectious disease DVT Prophylaxis  heparin  Lab Results  Component Value Date   PLT 153 04/01/2015     Time Spent in minutes 70minutes   Brittay Mogle M.D on 04/03/2015 at 9:27 AM  Between 7am to 6pm - Pager - [phone]  After 6pm go to [email or web address] - password [email or web address] [place] [facility]   Office  [phone]

## 2015-04-03 NOTE — Plan of Care (Signed)
Problem: Safety: Goal: Ability to remain free from injury will improve Outcome: Progressing Pt free from injury this shift.  Bed alarm on, bed in lowest position, call bell and phone within reach.  Pt calls appropriately with needs.  Problem: Physical Regulation: Goal: Ability to maintain clinical measurements within normal limits will improve Outcome: Progressing Pt received a PRN nebulizer treatment r/t wheezing.  Tolerated well. PRN pain meds given with dilaudid giving best relief. Goal: Will remain free from infection Outcome: Progressing Pt continues on IV ABX

## 2015-04-03 NOTE — Progress Notes (Signed)
PT Cancellation Note  Patient Details Name: [name] MRN: [id number] DOB: Mar 05, 1941   Cancelled Treatment:    Reason Eval/Treat Not Completed: Medical issues which prohibited therapy. Spoke with nursing post chart review regarding pt's blood sugar level. Current blood sugar levels are in the 400's. PT to be held. Re attempt treatment tomorrow.   Erline Levine Bishop 04/03/2015, 2:51 PM

## 2015-04-03 NOTE — NC FL2 (Signed)
[place] LEVEL OF CARE SCREENING TOOL     IDENTIFICATION  Patient Name: [name] Birthdate: [birth year] Sex: male Admission Date (Current Location): 03/29/2015  [facility] and [place] Number:    [id number]  Facility and Address:        [place] Provider Number:  [phone]  Attending Physician Name and Address:  Epifanio Lesches, MD  Relative Name and Phone Number:     Honore Kowalik [phone]  Current Level of Care:  Hospital  Recommended Level of Care:  SNF Prior Approval Number:    Date Approved/Denied:   PASRR Number:    Discharge Plan:      Current Diagnoses: Patient Active Problem List   Diagnosis Date Noted  . Fever 03/29/2015  . Sepsis ([place]) 02/25/2015  . Pancreatic cancer ([place]) 02/22/2015  . Pancreas neoplasm   . Acquired hyperbilirubinemia   . Disease of biliary tract   . Obstructive hyperbilirubinemia   . Jaundice   . Obstructive jaundice 02/10/2015  . Angina at rest [facility]) 10/26/2014  . 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 Diabetes mellitus   . Hyperlipidemia   . CAD (coronary artery disease)   . Sleep apnea   . Asthma   . BPH (benign prostatic hypertrophy)   . Anxiety   . Syncope     Orientation RESPIRATION BLADDER Height & Weight     person place  time   Room air  incontinent    5'8" and 207 lbs  BEHAVIORAL SYMPTOMS/MOOD NEUROLOGICAL BOWEL NUTRITION STATUS   good  NA  incontinent  Regular Diet  AMBULATORY STATUS COMMUNICATION OF NEEDS Skin good    difficulty walking due to leg and arm weakness  none                         Personal Care Assistance Level of Assistance   Max Assist  Dressing, bathing         Functional Limitations Info   Min Assist  needs assist with toileting dressing bathing        SPECIAL CARE FACTORS FREQUENCY                       Contractures      Additional Factors Info   Allergies    Zithromax, Lisinopri l            Current  Medications (04/03/2015):  This is the current hospital active medication list Current Facility-Administered Medications  Medication Dose Route Frequency Provider Last Rate Last Dose  . acetaminophen (TYLENOL) tablet 650 mg  650 mg Oral Q6H PRN Vaughan Basta, MD   650 mg at 03/31/15 1538  . aspirin EC tablet 81 mg  81 mg Oral Daily Vaughan Basta, MD   81 mg at 04/03/15 0829  . docusate sodium (COLACE) capsule 200 mg  200 mg Oral BID Epifanio Lesches, MD   200 mg at 04/03/15 1054  . gabapentin (NEURONTIN) capsule 300 mg  300 mg Oral BID Vaughan Basta, MD   300 mg at 04/03/15 0829  . heparin injection 5,000 Units  5,000 Units Subcutaneous 3 times per day Vaughan Basta, MD   5,000 Units at 04/03/15 1159  . HYDROmorphone (DILAUDID) injection 1 mg  1 mg Intravenous Q4H PRN Dustin Flock, MD   1 mg at 04/03/15 1058  . insulin aspart (novoLOG) injection 0-15 Units  0-15 Units Subcutaneous TID AC & HS Shanon Brow  Jannifer Franklin, MD   15 Units at 04/03/15 1159  . insulin aspart (novoLOG) injection 12 Units  12 Units Subcutaneous TID WC Epifanio Lesches, MD   12 Units at 04/03/15 1159  . insulin glargine (LANTUS) injection 50 Units  50 Units Subcutaneous BID Epifanio Lesches, MD      . ipratropium-albuterol (DUONEB) 0.5-2.5 (3) MG/3ML nebulizer solution 3 mL  3 mL Nebulization Q6H PRN Dustin Flock, MD   3 mL at 04/03/15 0251  . lactulose (CHRONULAC) 10 GM/15ML solution 30 g  30 g Oral Daily Epifanio Lesches, MD   30 g at 04/03/15 1053  . meloxicam (MOBIC) tablet 7.5 mg  7.5 mg Oral Daily Vaughan Basta, MD   7.5 mg at 04/03/15 0829  . methylPREDNISolone sodium succinate (SOLU-MEDROL) 125 mg/2 mL injection 60 mg  60 mg Intravenous Q12H Epifanio Lesches, MD   60 mg at 04/03/15 0235  . ondansetron (ZOFRAN) tablet 4 mg  4 mg Oral Q8H PRN Vaughan Basta, MD      . oxyCODONE-acetaminophen (PERCOCET/ROXICET) 5-325 MG per tablet 1 tablet  1 tablet Oral Q6H PRN  Dustin Flock, MD   1 tablet at 04/03/15 0930  . pantoprazole (PROTONIX) EC tablet 40 mg  40 mg Oral QAC breakfast Vaughan Basta, MD   40 mg at 04/03/15 0829  . piperacillin-tazobactam (ZOSYN) IVPB 3.375 g  3.375 g Intravenous Q8H Carrie Mew, MD   3.375 g at 04/03/15 1058  . polyethylene glycol powder (GLYCOLAX/MIRALAX) container 17 g  17 g Oral Daily PRN Vaughan Basta, MD   17 g at 04/02/15 0435  . pravastatin (PRAVACHOL) tablet 20 mg  20 mg Oral q1800 Vaughan Basta, MD   20 mg at 04/02/15 1713  . vancomycin (VANCOCIN) 1,250 mg in sodium chloride 0.9 % 250 mL IVPB  1,250 mg Intravenous Q24H Epifanio Lesches, MD   1,250 mg at 04/02/15 1713     Discharge Medications: Please see discharge summary for a list of discharge medications.  Relevant Imaging Results:  Relevant Lab Results:   Additional Information    Pauletta Browns, [place], [place]

## 2015-04-03 NOTE — Progress Notes (Signed)
[name]   DOB:11-18-1940   [id number]    Subjective: Fevers improved. However patient continues to feel extremely weak; shaky. He is having difficulty getting out of the bed by himself.  Review of system: mild shortness of breath; no cough. Swelling in the legs.  Objective:  Filed Vitals:   04/02/15 2059 04/03/15 0504  BP: 165/69 167/65  Pulse: 70 66  Temp: 97.5 F (36.4 C) 97.9 F (36.6 C)  Resp: 18 18     Intake/Output Summary (Last 24 hours) at 04/03/15 0950 Last data filed at 04/02/15 1800  Gross per 24 hour  Intake    480 ml  Output      0 ml  Net    480 ml    GENERAL:  Alert, no acute distress. However appears ill. Accompanied by wife/granddaughter. He is sitting in the bed eating breakfast. EYES: No icterus OROPHARYNX: no thrush or ulceration; poor dentition NECK: supple, no masses felt LUNGS:decreased breath sounds bilaterally at the bases. No wheeze or crackles HEART/CVS: regular rate & rhythm and no murmurs; 1+ bilateral lower extremity edema ABDOMEN: abdomen soft, non-tender and normal bowel sounds Musculoskeletal:no cyanosis of digits and no clubbing  PSYCH: alert & oriented x 3 with flat affect/appears depressed NEURO: no focal motor/sensory deficits; generalized weakness present.he has coarse tremors bilateral upper extremities.  SKIN: no rashes or significant lesions  Labs:  Lab Results  Component Value Date   WBC 6.5 04/01/2015   HGB 8.8* 04/01/2015   HCT 27.7* 04/01/2015   MCV 86.0 04/01/2015   PLT 153 04/01/2015   NEUTROABS 13.5* 03/29/2015    Lab Results  Component Value Date   NA 144 04/01/2015   K 3.4* 04/01/2015   CL 113* 04/01/2015   CO2 24 04/01/2015    Studies:  Dg Chest Port 1 View  04/01/2015  CLINICAL DATA:  74 year old with current history of pancreatic cancer, sepsis, and persistent fevers. EXAM: PORTABLE CHEST 1 VIEW COMPARISON:  03/29/2015 and earlier, including CT chest 02/25/2015. FINDINGS: Prior sternotomy  for CABG. Cardiac silhouette moderately enlarged, unchanged. Pulmonary vascularity normal without evidence of pulmonary edema. Suboptimal inspiration with atelectasis in the lung bases, left greater than right, increased since the examination 3 days ago. No new pulmonary parenchymal abnormalities elsewhere. IMPRESSION: 1. Suboptimal inspiration accounts for bibasilar atelectasis, left greater than right, worse than the examination 3 days ago. 2. Stable cardiomegaly without pulmonary edema. 3. No new abnormalities elsewhere. Electronically Signed   By: Evangeline Dakin M.D.   On: 04/01/2015 17:04   Mr [name] Cm/mrcp  04/01/2015  CLINICAL DATA:  Pancreatic adenocarcinoma. EXAM: MRI ABDOMEN WITHOUT CONTRAST  (INCLUDING MRCP) TECHNIQUE: Multiplanar multisequence MR imaging of the abdomen was performed. Heavily T2-weighted images of the biliary and pancreatic ducts were obtained, and three-dimensional MRCP images were rendered by post processing. COMPARISON:  03/29/2015 FINDINGS: Images significantly degraded by motion artifact from breathing. Lower chest:  Small left pleural effusion.  Cardiomegaly. Hepatobiliary: Mild intrahepatic biliary dilatation with the common bile duct measuring 1.6 cm. There could be some gas in the common bile duct. The CBD seems severely narrowed in the vicinity of the pancreatic head, with apparent fluid in the thin central lumen of the stent on images 20-25 of series 10 extending through this region. The stent itself is much harder to see on MR than on prior CT but is thought to still be present. No obvious enhancing hepatic mass seen, subject to reduce sensitivity from motion degradation of images. I am  uncertain whether the apparent linear filling defects in the right and left portal veins are due to thrombus or artifact. Numerous tiny gallstones are present in the gallbladder. Pancreas: Dilated dorsal pancreatic duct with dilated side ducts and truncation in the pancreatic head again  observed. Aside from the truncation, I do not see obvious differential enhancement in the pancreatic head to further signify the presence of the mass, although there is some soft tissue fullness in this vicinity. Spleen: Unremarkable Adrenals/Urinary Tract: Right mid kidney cyst measures about 8 mm diameter. Adrenal glands unremarkable. Stomach/Bowel: Unremarkable Vascular/Lymphatic: Known abdominal aortic atherosclerosis. A porta hepatis node measures 1.3 cm in short axis on image 18 series 7. Other: No supplemental non-categorized findings. Musculoskeletal: Unremarkable IMPRESSION: 1. Questionable linear filling defects in the right and left portal veins intrahepatic portions. Although quite likely from motion artifact, the appearance makes it difficult to confidently exclude the possibility of early portal vein thrombosis. Consider vascular ultrasound of the liver for further characterization. 2. Narrowing of the biliary tree in the vicinity of the pancreatic head. In this area, only the thing in the lumen of the stent is visualized as continuous. There is mild intrahepatic biliary dilatation and moderate extrahepatic biliary dilatation. There is likely also some pneumobilia. 3. Numerous tiny gallstones in the gallbladder. 4. The pancreatic head mass is mainly observable due to truncation of the dilated dorsal pancreatic duct. Differential enhancement in this vicinity is not well seen. 5. Mild porta hepatis adenopathy. 6. Small left pleural effusion with cardiomegaly. 7. Images are degraded by motion artifact. Electronically Signed   By: Van Clines M.D.   On: 04/01/2015 11:03    Assessment & Plan:   74 year old male patient with a history of stage II pancreatic cancer currently status post cycle #1 chemotherapy and radiation admitted to the hospital for high-grade fevers.   # fever-unclear etiology/improving; patient on IV antibiotics. Infectious disease is following. blood cultures negative.  Appreciate GI input.  # Pancreatic cancer-stage II B-No obvious progression of the cancer noted on the MRCP of the liver.  Given the overall deconditioning /multiple admission to hospital - I think patient will have poor tolerance to chemotherapy.  I will likely recommend only palliative radiation. This was also discussed with Dr. Donella Stade.  # Deconditioning- patient will likely need rehabilitation. Discussed at length with the patient's wife. Patient interested in PT at home; however I think patient is very deconditioned- likely benefit from rehabilitation at a nursing home.  #  All questions were answered. The above plan of care was discussed with the patient and his wife/granddaughter.   Cammie Sickle, MD 04/03/2015  9:50 AM

## 2015-04-03 NOTE — Progress Notes (Signed)
ANTIBIOTIC CONSULT NOTE - INITIAL  Pharmacy Consult for Vancomycin Indication: rule out sepsis  Allergies  Allergen Reactions  . Zithromax [Azithromycin] Anaphylaxis and Other (See Comments)    Reaction:  Stroke-like symptoms   . Lisinopril Other (See Comments)    Per spouse, pt did not tolerate well in the past.      Patient Measurements: Height: 5\' 8"  (172.7 cm) Weight: 207 lb 10.8 oz (94.2 kg) IBW/kg (Calculated) : 68.4   Vital Signs: Temp: 97.9 F (36.6 C) (12/12 0504) Temp Source: Oral (12/12 0504) BP: 167/65 mmHg (12/12 0504) Pulse Rate: 66 (12/12 0504)  Labs:  Recent Labs  04/01/15 0615 04/02/15 0540 04/03/15 0903  WBC 6.5  --   --   HGB 8.8*  --   --   PLT 153  --   --   CREATININE 2.10* 1.90* 1.95*   Estimated Creatinine Clearance: 37 mL/min (by C-G formula based on Cr of 1.95). No results for input(s): VANCOTROUGH, VANCOPEAK, VANCORANDOM, GENTTROUGH, GENTPEAK, GENTRANDOM, TOBRATROUGH, TOBRAPEAK, TOBRARND, AMIKACINPEAK, AMIKACINTROU, AMIKACIN in the last 72 hours.   Microbiology: Recent Results (from the past 720 hour(s))  Urine culture     Status: None   Collection Time: 03/29/15  9:55 AM  Result Value Ref Range Status   Specimen Description URINE, RANDOM  Final   Special Requests NONE  Final   Culture 1,000 COLONIES/mL INSIGNIFICANT GROWTH   Final   Report Status 03/30/2015 FINAL  Final  Blood Culture (routine x 2)     Status: None (Preliminary result)   Collection Time: 03/29/15 10:00 AM  Result Value Ref Range Status   Specimen Description BLOOD LEFT HAND  Final   Special Requests BOTTLES DRAWN AEROBIC AND ANAEROBIC  11 CC  Final   Culture NO GROWTH 4 DAYS  Final   Report Status PENDING  Incomplete  Blood Culture (routine x 2)     Status: None (Preliminary result)   Collection Time: 03/29/15 10:08 AM  Result Value Ref Range Status   Specimen Description BLOOD LEFT ARM  Final   Special Requests   Final    BOTTLES DRAWN AEROBIC AND ANAEROBIC   1CC ANAERO 3CC AERO   Culture NO GROWTH 4 DAYS  Final   Report Status PENDING  Incomplete   Assessment: Pharmacy consulted to dose vancomycin and zosyn for PNA in this 74 year old male with pancreatic cancer.  Zosyn 12/7 >> Vancomycin 12/7>8, resumed 12/10>>  Goal of Therapy:  Vancomycin trough level 15-20 mcg/ml  Plan:  Continue zosyn 3.375gm IV Q8H extended infusion  Continue vancomycin 1250mg  IV Q24H. Trough prior to 4th dose, which should be approaching steady state.  Pharmacy to follow per consult.   Rexene Edison, PharmD Clinical Pharmacist  04/03/2015 2:14 PM

## 2015-04-03 NOTE — Progress Notes (Signed)
Inpatient Diabetes Program Recommendations  AACE/ADA: New Consensus Statement on Inpatient Glycemic Control (2015)  Target Ranges:  Prepandial:   less than 140 mg/dL      Peak postprandial:   less than 180 mg/dL (1-2 hours)      Critically ill patients:  140 - 180 mg/dL  Results for OTHER, SCHNITZER (MRN [id number]) as of 04/03/2015 10:20  Ref. Range 04/02/2015 07:15 04/02/2015 07:42 04/02/2015 11:15 04/02/2015 16:15 04/02/2015 16:19 04/02/2015 16:24 04/02/2015 21:27 04/02/2015 21:32 04/03/2015 07:51 04/03/2015 08:13  Glucose-Capillary Latest Ref Range: 65-99 mg/dL 412 (H) 406 (H) 424 (H) 477 (H) 503 (H) 489 (H) 452 (H) 439 (H) 411 (H) 450 (H)   Review of Glycemic Control  Diabetes history: DM2 Outpatient Diabetes medications: Lantus 60 untis BID, Novolin R 5-15 units TID with meals Current orders for Inpatient glycemic control: Lantus 40 units daily, Novolog 0-15 units ACHS, Novolog 5 units TID with meals for meal coverage  Inpatient Diabetes Program Recommendations: Insulin - Basal: Glucose has ranged from 406-503 mg/dl over the past 24 hours. Patient recieved Lantus 30 units on 04/02/15 at 12:46 and Lantus 40 units on 04/02/15 at 22:23 (so patient received a total of Lantus 70 units on 04/02/15). Fasting glucose is 411 mg/dl today at 7:51 am. Please consider increasing Lantus to 50 units BID. Insulin - Meal Coverage: Noted meal coverage was decreased to Novolog 5 units TID with meals this morning despite elevated glucose with Novolog 9 units TID meal coverage. Please consider increasing Novolog meal coverage to 12 units TID with meals.  Note: Noted Solumedrol decreased from 60 mg to 40 mg Q12H. Despite decrease in steroids, anticipate patient will require increased insulin dosages.  Thanks, Barnie Alderman, RN, MSN, CDE Diabetes Coordinator Inpatient Diabetes Program [phone] (Team Pager from [place] to [place]) [phone] ([place] office) [phone] [facility] office) [phone] [facility]  office)

## 2015-04-03 NOTE — Progress Notes (Signed)
Problem: Activity: Goal: Ability to implement measures to reduce episodes of fatigue will improve Outcome: Progressing Pt is moderate falls, up in room unassisted, call bell and phone within reach, call out when assistance is needed  Problem: Education: Goal: Knowledge of the prescribed therapeutic regimen will improve Outcome: Progressing Pt given information guide and educated on using call system. Verbalized understanding  Problem: Coping: Goal: Ability to identify and develop effective coping behavior will improve Outcome: Progressing Pt is calm and cooperative.   Problem: Nutritional: Goal: Maintenance of adequate nutrition will improve Outcome: Progressing Pt had good po intake at lunch and dinner. Snacks given.

## 2015-04-03 NOTE — Progress Notes (Signed)
   04/03/15 1000  Clinical Encounter Type  Visited With Patient and family together  Visit Type Initial  Provided pastoral presence and support to patient and his wife on unit.  [place] [phone]

## 2015-04-04 ENCOUNTER — Ambulatory Visit: Payer: Commercial Managed Care - HMO

## 2015-04-04 DIAGNOSIS — L8990 Pressure ulcer of unspecified site, unspecified stage: Secondary | ICD-10-CM | POA: Insufficient documentation

## 2015-04-04 LAB — BASIC METABOLIC PANEL
ANION GAP: 6 (ref 5–15)
BUN: 51 mg/dL — ABNORMAL HIGH (ref 6–20)
CO2: 25 mmol/L (ref 22–32)
Calcium: 8.8 mg/dL — ABNORMAL LOW (ref 8.9–10.3)
Chloride: 119 mmol/L — ABNORMAL HIGH (ref 101–111)
Creatinine, Ser: 1.78 mg/dL — ABNORMAL HIGH (ref 0.61–1.24)
GFR calc Af Amer: 42 mL/min — ABNORMAL LOW (ref >60)
GFR, EST NON AFRICAN AMERICAN: 36 mL/min — AB (ref >60)
GLUCOSE: 273 mg/dL — AB (ref 65–99)
POTASSIUM: 3.9 mmol/L (ref 3.5–5.1)
Sodium: 150 mmol/L — ABNORMAL HIGH (ref 135–145)

## 2015-04-04 LAB — CBC
HEMATOCRIT: 31.6 % — AB (ref 40.0–52.0)
Hemoglobin: 10.1 g/dL — ABNORMAL LOW (ref 13.0–18.0)
MCH: 27.3 pg (ref 26.0–34.0)
MCHC: 31.8 g/dL — ABNORMAL LOW (ref 32.0–36.0)
MCV: 85.7 fL (ref 80.0–100.0)
Platelets: 186 10*3/uL (ref 150–440)
RBC: 3.69 MIL/uL — AB (ref 4.40–5.90)
RDW: 15.3 % — ABNORMAL HIGH (ref 11.5–14.5)
WBC: 6.5 10*3/uL (ref 3.8–10.6)

## 2015-04-04 LAB — GLUCOSE, CAPILLARY
GLUCOSE-CAPILLARY: 306 mg/dL — AB (ref 65–99)
Glucose-Capillary: 254 mg/dL — ABNORMAL HIGH (ref 65–99)
Glucose-Capillary: 380 mg/dL — ABNORMAL HIGH (ref 65–99)

## 2015-04-04 LAB — POCT CBG MONITORING: POCT GLUCOSE (MANUAL ENTRY) KUC: 250 mg/dL — AB (ref 70–99)

## 2015-04-04 MED ORDER — METHYLPREDNISOLONE SODIUM SUCC 125 MG IJ SOLR
60.0000 mg | INTRAMUSCULAR | Status: DC
  Administered 2015-04-05: 05:00:00 60 mg via INTRAVENOUS
  Filled 2015-04-04: qty 2

## 2015-04-04 MED ORDER — INSULIN GLARGINE 100 UNIT/ML ~~LOC~~ SOLN
55.0000 [IU] | Freq: Two times a day (BID) | SUBCUTANEOUS | Status: DC
  Administered 2015-04-04 – 2015-04-06 (×4): 55 [IU] via SUBCUTANEOUS
  Filled 2015-04-04 (×6): qty 0.55

## 2015-04-04 MED ORDER — DEXTROSE 5 % IV SOLN
INTRAVENOUS | Status: DC
  Administered 2015-04-04 – 2015-04-05 (×2): via INTRAVENOUS

## 2015-04-04 NOTE — Progress Notes (Signed)
Inpatient Diabetes Program Recommendations  AACE/ADA: New Consensus Statement on Inpatient Glycemic Control (2015)  Target Ranges:  Prepandial:   less than 140 mg/dL      Peak postprandial:   less than 180 mg/dL (1-2 hours)      Critically ill patients:  140 - 180 mg/dL  Results for UKIAH, SUIRE (MRN [id number]) as of 04/04/2015 09:00  Ref. Range 04/03/2015 07:51 04/03/2015 08:13 04/03/2015 11:06 04/03/2015 11:08 04/03/2015 16:15 04/03/2015 22:23 04/03/2015 23:28 04/04/2015 07:09  Glucose-Capillary Latest Ref Range: 65-99 mg/dL 411 (H) 450 (H) 443 (H) 444 (H) 349 (H) 353 (H) 347 (H) 254 (H)   Review of Glycemic Control Diabetes history: DM2 Outpatient Diabetes medications: Lantus 60 untis BID, Novolin R 5-15 units TID with meals Current orders for Inpatient glycemic control: Lantus 50 units BID, Novolog 0-15 units ACHS, Novolog 12 units TID with meals for meal coverage  Inpatient Diabetes Program Recommendations: Insulin - Basal: Fasting glucose 254 mg/dl this morning. Please consider increasing Lantus to 55 units BID.  Thanks, Barnie Alderman, RN, MSN, CDE Diabetes Coordinator Inpatient Diabetes Program [phone] (Team Pager from [place] to [place]) [phone] ([place] office) [phone] [facility] office) [phone] [facility] office)

## 2015-04-04 NOTE — Progress Notes (Signed)
[place] [facility]                                                                                                                                                                                            Patient Demographics   [name], is a 74 y.o. male, DOB - [birth year], [id number]  Admit date - 03/29/2015   Admitting Physician Vaughan Basta, MD  Outpatient Primary MD for the patient is Rusty Aus., MD   LOS - 6  Subjective: No further fevers. Wheezing resolved.constipation  resolved with stool softeners that were given yesterday. Today agitated today morning saying that he does not want to go to rehabilitation . extremely weak and shaky.  glucose finally well controlled to 250 range.   Blood glucose in 400 range.  Review of Systems:   CONSTITUTIONAL:  , fatigue present. EYES: No blurry or double vision.  ENT: No tinnitus. No postnasal drip. No redness of the oropharynx.  RESPIRATORY: No wheezing, no shortness of breath CARDIOVASCULAR: No chest pain. No orthopnea. No palpitations. No syncope.  GASTROINTESTINAL:Complains of constipation, abdominal pain.  GENITOURINARY: No dysuria or hematuria.  ENDOCRINE: No polyuria or nocturia. No heat or cold intolerance.  HEMATOLOGY: No anemia. No bruising. No bleeding.  INTEGUMENTARY: No rashes. No lesions.  MUSCULOSKELETAL: No arthritis. No swelling. No gout.  NEUROLOGIC: No numbness, tingling, or ataxia. No seizure-type activity.  PSYCHIATRIC: No anxiety. No insomnia. No ADD.    Vitals:   Filed Vitals:   04/03/15 0251 04/03/15 0504 04/03/15 2123 04/04/15 0431  BP:  167/65 162/71 173/75  Pulse:  66 71 69  Temp:  97.9 F (36.6 C) 97.7 F (36.5 C) 98.4 F (36.9 C)  TempSrc:  Oral Oral Oral  Resp:  18 18 18   Height:      Weight:      SpO2: 95% 95% 96% 94%    Wt Readings from Last 3 Encounters:  03/29/15 94.2 kg (207 lb 10.8 oz)  03/23/15 92.4 kg (203 lb 11.3 oz)   03/09/15 93.35 kg (205 lb 12.8 oz)     Intake/Output Summary (Last 24 hours) at 04/04/15 [id number] Last data filed at 04/03/15 1900  Gross per 24 hour  Intake    360 ml  Output      0 ml  Net    360 ml    Physical Exam:   GENERAL: Pleasant-appearing in no apparent distress. Chronically ill-appearing male HEAD, EYES, EARS, NOSE AND THROAT: Atraumatic, normocephalic. Extraocular muscles are intact. Pupils equal and reactive to light. Sclerae anicteric. No conjunctival injection. No oro-pharyngeal erythema.  NECK: Supple. There  is no jugular venous distention. No bruits, no lymphadenopathy, no thyromegaly.  HEART: Regular rate and rhythm,. No murmurs, no rubs, no clicks.  LUNGS: clear To auscultation. no wheezing  ABDOMEN: Soft, flat, mild epigastric tenderness, nondistended. Has good bowel sounds. No hepatosplenomegaly appreciated.  EXTREMITIES: No evidence of any cyanosis, clubbing, or peripheral edema.  +2 pedal and radial pulses bilaterally.  NEUROLOGIC: The patient is alert, awake, and oriented x3 with no focal motor or sensory deficits appreciated bilaterally.  SKIN: Moist and warm with no rashes appreciated.  Psych: Not anxious, depressed LN: No inguinal LN enlargement    Antibiotics   Anti-infectives    Start     Dose/Rate Route Frequency Ordered Stop   04/03/15 1745  cefTRIAXone (ROCEPHIN) 1 g in dextrose 5 % 50 mL IVPB     1 g 100 mL/hr over 30 Minutes Intravenous Every 24 hours 04/03/15 1732     04/03/15 1700  metroNIDAZOLE (FLAGYL) IVPB 500 mg     500 mg 100 mL/hr over 60 Minutes Intravenous Every 8 hours 04/03/15 1646     04/01/15 1800  vancomycin (VANCOCIN) 1,250 mg in sodium chloride 0.9 % 250 mL IVPB  Status:  Discontinued     1,250 mg 166.7 mL/hr over 90 Minutes Intravenous Every 24 hours 04/01/15 1733 04/03/15 1646   04/01/15 1730  vancomycin (VANCOCIN) IVPB 1000 mg/200 mL premix  Status:  Discontinued     1,000 mg 200 mL/hr over 60 Minutes Intravenous  Once  04/01/15 1721 04/01/15 1733   03/29/15 2200  vancomycin (VANCOCIN) IVPB 1000 mg/200 mL premix  Status:  Discontinued     1,000 mg 200 mL/hr over 60 Minutes Intravenous Every 18 hours 03/29/15 1324 03/30/15 1518   03/29/15 1900  piperacillin-tazobactam (ZOSYN) IVPB 3.375 g  Status:  Discontinued     3.375 g 12.5 mL/hr over 240 Minutes Intravenous Every 8 hours 03/29/15 1324 04/03/15 1646   03/29/15 1000  piperacillin-tazobactam (ZOSYN) IVPB 3.375 g     3.375 g 100 mL/hr over 30 Minutes Intravenous  Once 03/29/15 0949 03/29/15 1340   03/29/15 1000  vancomycin (VANCOCIN) 1,500 mg in sodium chloride 0.9 % 500 mL IVPB     1,500 mg 250 mL/hr over 120 Minutes Intravenous  Once 03/29/15 0949 03/29/15 1238      Medications   Scheduled Meds: . aspirin EC  81 mg Oral Daily  . cefTRIAXone (ROCEPHIN)  IV  1 g Intravenous Q24H  . docusate sodium  200 mg Oral BID  . gabapentin  300 mg Oral BID  . heparin  5,000 Units Subcutaneous 3 times per day  . insulin aspart  0-15 Units Subcutaneous TID AC & HS  . insulin aspart  12 Units Subcutaneous TID WC  . insulin glargine  50 Units Subcutaneous BID  . lactulose  30 g Oral Daily  . meloxicam  7.5 mg Oral Daily  . methylPREDNISolone (SOLU-MEDROL) injection  60 mg Intravenous Q12H  . metronidazole  500 mg Intravenous Q8H  . pantoprazole  40 mg Oral QAC breakfast  . pravastatin  20 mg Oral q1800   Continuous Infusions: . dextrose 50 mL/hr at 04/04/15 0834   PRN Meds:.acetaminophen, HYDROmorphone (DILAUDID) injection, ipratropium-albuterol, ondansetron, oxyCODONE-acetaminophen, polyethylene glycol powder   Data Review:   Micro Results Recent Results (from the past 240 hour(s))  Urine culture     Status: None   Collection Time: 03/29/15  9:55 AM  Result Value Ref Range Status   Specimen Description URINE, RANDOM  Final  Special Requests NONE  Final   Culture 1,000 COLONIES/mL INSIGNIFICANT GROWTH   Final   Report Status 03/30/2015 FINAL   Final  Blood Culture (routine x 2)     Status: None   Collection Time: 03/29/15 10:00 AM  Result Value Ref Range Status   Specimen Description BLOOD LEFT HAND  Final   Special Requests BOTTLES DRAWN AEROBIC AND ANAEROBIC  11 CC  Final   Culture NO GROWTH 5 DAYS  Final   Report Status 04/03/2015 FINAL  Final  Blood Culture (routine x 2)     Status: None   Collection Time: 03/29/15 10:08 AM  Result Value Ref Range Status   Specimen Description BLOOD LEFT ARM  Final   Special Requests   Final    BOTTLES DRAWN AEROBIC AND ANAEROBIC  1CC ANAERO 3CC AERO   Culture NO GROWTH 5 DAYS  Final   Report Status 04/03/2015 FINAL  Final    Radiology Reports Ct Abdomen Pelvis W Contrast  03/29/2015  CLINICAL DATA:  74 year old male diagnosed with pancreatic moderately differentiated adenocarcinoma last month via endoscopic ultrasound biopsy. status post chemotherapy, radiation, stent placement. Abdominal pain and distension with fever for 2 days. Subsequent encounter. EXAM: CT ABDOMEN AND PELVIS WITH CONTRAST TECHNIQUE: Multidetector CT imaging of the abdomen and pelvis was performed using the standard protocol following bolus administration of intravenous contrast. CONTRAST:  68mL OMNIPAQUE IOHEXOL 300 MG/ML  SOLN COMPARISON:  CT Abdomen and Pelvis 02/14/2015 and earlier. FINDINGS: Stable cardiomegaly. No pericardial effusion. Small layering left pleural effusion is stable. Atelectasis at the lung bases. Previous sternotomy. No acute or metastatic osseous lesion identified. Stable small fat containing left inguinal hernia. No pelvic free fluid. Negative urinary bladder. Retained stool throughout the distal colon. Redundant sigmoid with some gaseous distension. Retained stool throughout the left colon, and more proximal colon. Redundant right colon. Normal appendix. Negative terminal ileum. No dilated small bowel. Small sliding-type gastric hiatal hernia. Otherwise negative stomach. CBD stent in place. Mild  gaseous distension of the duodenum which otherwise appears normal. Pneumobilia has not significantly changed. Pancreatic ductal dilatation has not significantly changed. Hypodense lesions at the pancreatic head and neck appears stable (series 2, images 31 and 32). Stable peripancreatic lymph node measuring 11 mm short axis situated between the IVC and main portal vein. Other porta hepatis nodes are stable measuring up to 12 mm short axis. Liver enhancement and gallbladder are within normal limits (small volume of pneumobilia related gas in the gallbladder). Spleen and adrenal glands within normal limits. Portal venous system is patent. Aortoiliac calcified atherosclerosis noted. Major arterial structures are patent. No other abdominal or pelvic lymphadenopathy bilateral initial phase renal enhancement is stable and within normal limits. On delayed images there is decreased left renal contrast excretion. The right kidney appears stable. No abdominal free fluid. IMPRESSION: 1. Largely stable CT appearance of the abdomen since October. CBD stent in place. Hypodense pancreatic head and neck lesions with surrounding 11-12 mm short axis lymph nodes compatible with pancreatic adenocarcinoma. No distant metastatic disease identified in the abdomen or pelvis. 2. Lack of left renal contrast excretion on delayed images today suggesting a degree of left renal insufficiency. Electronically Signed   By: Genevie Ann M.D.   On: 03/29/2015 12:27   US Venous Img Lower Bilateral  03/31/2015  CLINICAL DATA:  Swelling. EXAM: BILATERAL LOWER EXTREMITY VENOUS DOPPLER ULTRASOUND TECHNIQUE: Gray-scale sonography with graded compression, as well as color Doppler and duplex ultrasound were performed to evaluate the lower extremity  deep venous systems from the level of the common femoral vein and including the common femoral, femoral, profunda femoral, popliteal and calf veins including the posterior tibial, peroneal and gastrocnemius veins  when visible. The superficial great saphenous vein was also interrogated. Spectral Doppler was utilized to evaluate flow at rest and with distal augmentation maneuvers in the common femoral, femoral and popliteal veins. COMPARISON:  None. FINDINGS: RIGHT LOWER EXTREMITY Common Femoral Vein: No evidence of thrombus. Normal compressibility, respiratory phasicity and response to augmentation. Saphenofemoral Junction: No evidence of thrombus. Normal compressibility and flow on color Doppler imaging. Profunda Femoral Vein: No evidence of thrombus. Normal compressibility and flow on color Doppler imaging. Femoral Vein: No evidence of thrombus. Normal compressibility, respiratory phasicity and response to augmentation. Popliteal Vein: No evidence of thrombus. Normal compressibility, respiratory phasicity and response to augmentation. Calf Veins: No evidence of thrombus. Normal compressibility and flow on color Doppler imaging. Superficial Great Saphenous Vein: No evidence of thrombus. Normal compressibility and flow on color Doppler imaging. Other Findings:  None. LEFT LOWER EXTREMITY Common Femoral Vein: No evidence of thrombus. Normal compressibility, respiratory phasicity and response to augmentation. Saphenofemoral Junction: No evidence of thrombus. Normal compressibility and flow on color Doppler imaging. Profunda Femoral Vein: No evidence of thrombus. Normal compressibility and flow on color Doppler imaging. Femoral Vein: No evidence of thrombus. Normal compressibility, respiratory phasicity and response to augmentation. Popliteal Vein: No evidence of thrombus. Normal compressibility, respiratory phasicity and response to augmentation. Calf Veins: No evidence of thrombus. Normal compressibility and flow on color Doppler imaging. Superficial Great Saphenous Vein: No evidence of thrombus. Normal compressibility and flow on color Doppler imaging. Other Findings:  None. IMPRESSION: No evidence of deep venous thrombosis.  Electronically Signed   By: Marcello Moores  Register   On: 03/31/2015 14:20   Dg Chest Port 1 View  04/01/2015  CLINICAL DATA:  74 year old with current history of pancreatic cancer, sepsis, and persistent fevers. EXAM: PORTABLE CHEST 1 VIEW COMPARISON:  03/29/2015 and earlier, including CT chest 02/25/2015. FINDINGS: Prior sternotomy for CABG. Cardiac silhouette moderately enlarged, unchanged. Pulmonary vascularity normal without evidence of pulmonary edema. Suboptimal inspiration with atelectasis in the lung bases, left greater than right, increased since the examination 3 days ago. No new pulmonary parenchymal abnormalities elsewhere. IMPRESSION: 1. Suboptimal inspiration accounts for bibasilar atelectasis, left greater than right, worse than the examination 3 days ago. 2. Stable cardiomegaly without pulmonary edema. 3. No new abnormalities elsewhere. Electronically Signed   By: Evangeline Dakin M.D.   On: 04/01/2015 17:04   Dg Chest Port 1 View  03/29/2015  CLINICAL DATA:  Fever.  Pancreatic cancer. EXAM: PORTABLE CHEST 1 VIEW COMPARISON:  Chest radiograph 02/25/2015.  CT chest 02/25/2015. FINDINGS: The heart is enlarged. 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 Thoracic atherosclerosis. Low lung volumes. No definite active infiltrates or failure. No osseous findings. IMPRESSION: Low lung volumes. No definite active infiltrates or failure. Cardiomegaly. Electronically Signed   By: Staci Righter M.D.   On: 03/29/2015 10:39   Mr [name] Cm/mrcp  04/01/2015  CLINICAL DATA:  Pancreatic adenocarcinoma. EXAM: MRI ABDOMEN WITHOUT CONTRAST  (INCLUDING MRCP) TECHNIQUE: Multiplanar multisequence MR imaging of the abdomen was performed. Heavily T2-weighted images of the biliary and pancreatic ducts were obtained, and three-dimensional MRCP images were rendered by post processing. COMPARISON:  03/29/2015 FINDINGS: Images significantly degraded by motion artifact from breathing. Lower chest:  Small left pleural effusion.  Cardiomegaly.  Hepatobiliary: Mild intrahepatic biliary dilatation with the common bile duct measuring 1.6 cm. There could  be some gas in the common bile duct. The CBD seems severely narrowed in the vicinity of the pancreatic head, with apparent fluid in the thin central lumen of the stent on images 20-25 of series 10 extending through this region. The stent itself is much harder to see on MR than on prior CT but is thought to still be present. No obvious enhancing hepatic mass seen, subject to reduce sensitivity from motion degradation of images. I am uncertain whether the apparent linear filling defects in the right and left portal veins are due to thrombus or artifact. Numerous tiny gallstones are present in the gallbladder. Pancreas: Dilated dorsal pancreatic duct with dilated side ducts and truncation in the pancreatic head again observed. Aside from the truncation, I do not see obvious differential enhancement in the pancreatic head to further signify the presence of the mass, although there is some soft tissue fullness in this vicinity. Spleen: Unremarkable Adrenals/Urinary Tract: Right mid kidney cyst measures about 8 mm diameter. Adrenal glands unremarkable. Stomach/Bowel: Unremarkable Vascular/Lymphatic: Known abdominal aortic atherosclerosis. A porta hepatis node measures 1.3 cm in short axis on image 18 series 7. Other: No supplemental non-categorized findings. Musculoskeletal: Unremarkable IMPRESSION: 1. Questionable linear filling defects in the right and left portal veins intrahepatic portions. Although quite likely from motion artifact, the appearance makes it difficult to confidently exclude the possibility of early portal vein thrombosis. Consider vascular ultrasound of the liver for further characterization. 2. Narrowing of the biliary tree in the vicinity of the pancreatic head. In this area, only the thing in the lumen of the stent is visualized as continuous. There is mild intrahepatic biliary dilatation and  moderate extrahepatic biliary dilatation. There is likely also some pneumobilia. 3. Numerous tiny gallstones in the gallbladder. 4. The pancreatic head mass is mainly observable due to truncation of the dilated dorsal pancreatic duct. Differential enhancement in this vicinity is not well seen. 5. Mild porta hepatis adenopathy. 6. Small left pleural effusion with cardiomegaly. 7. Images are degraded by motion artifact. Electronically Signed   By: Van Clines M.D.   On: 04/01/2015 11:03     CBC  Recent Labs Lab 03/29/15 0955 03/30/15 0805 03/31/15 0452 04/01/15 0615 04/04/15 0547  WBC 14.8* 11.4* 7.9 6.5 6.5  HGB 10.3* 9.6* 9.0* 8.8* 10.1*  HCT 32.5* 30.7* 28.5* 27.7* 31.6*  PLT 187 149* 162 153 186  MCV 85.0 86.3 86.6 86.0 85.7  MCH 26.9 27.0 27.4 27.3 27.3  MCHC 31.7* 31.2* 31.6* 31.8* 31.8*  RDW 16.0* 16.0* 15.7* 15.4* 15.3*  LYMPHSABS 1.1  --   --   --   --   MONOABS 0.1*  --   --   --   --   EOSABS 0.0  --   --   --   --   BASOSABS 0.1  --   --   --   --     Chemistries   Recent Labs Lab 03/29/15 0955 03/30/15 0805 03/31/15 0452 04/01/15 0615 04/02/15 0540 04/03/15 0903 04/04/15 0547  NA 139 142 143 144  --   --  150*  K 3.8 4.3 3.9 3.4*  --   --  3.9  CL 109 111 111 113*  --   --  119*  CO2 22 23 25 24   --   --  25  GLUCOSE 206* 299* 352* 206*  --   --  273*  BUN 31* 32* 37* 34*  --   --  51*  CREATININE 1.69* 1.87*  2.16* 2.10* 1.90* 1.95* 1.78*  CALCIUM 8.3* 8.3* 8.2* 8.2*  --   --  8.8*  MG 1.2*  --   --   --   --   --   --   AST 35  --   --   --   --   --   --   ALT 23  --   --   --   --   --   --   ALKPHOS 92  --   --   --   --   --   --   BILITOT 1.2  --   --   --   --   --   --    ------------------------------------------------------------------------------------------------------------------ estimated creatinine clearance is 40.5 mL/min (by C-G formula based on Cr of  1.78). ------------------------------------------------------------------------------------------------------------------ No results for input(s): HGBA1C in the last 72 hours. ------------------------------------------------------------------------------------------------------------------ No results for input(s): CHOL, HDL, LDLCALC, TRIG, CHOLHDL, LDLDIRECT in the last 72 hours. ------------------------------------------------------------------------------------------------------------------ No results for input(s): TSH, T4TOTAL, T3FREE, THYROIDAB in the last 72 hours.  Invalid input(s): FREET3 ------------------------------------------------------------------------------------------------------------------ No results for input(s): VITAMINB12, FOLATE, FERRITIN, TIBC, IRON, RETICCTPCT in the last 72 hours.  Coagulation profile  Recent Labs Lab 03/29/15 0955  INR 1.27    No results for input(s): DDIMER in the last 72 hours.  Cardiac Enzymes  Recent Labs Lab 03/29/15 0955  TROPONINI 0.06*   ------------------------------------------------------------------------------------------------------------------ Invalid input(s): POCBNP    Assessment & Plan   * Sepsis due to pneumobilia: Bilateral pneumonia: Continue vancomycin, Zosyn. Vancomycin started yesterday secondary to wheezing, pneumonia found on x-ray to cover for MRSA. Patient has no fevers. Breathing is better. No wheezing today. Seen by GI. Thinks that stent is in place and no further GI workup is planned Given normal LFT's, unlikely that fever is related to biliary stent.  Discussed  with the patient.* In by ID, recommend discontinuing Vanco, starting ceftriaxone and Flagyl.    Adenocarcinoma of the pancreas Follows with cancer Center, recently started on chemotherapy and radiation. Prognosis poor. 2 deconditioning he may not tolerate further chemotherapy. Radiation oncology has been contacted for palliative radiation  therapy. * Diabetes; Hyperglycemia secondary to steroid use: Added mealtime coverage, increased the Lantus, continue sliding-scale coverage.  * History of coronary artery disease Continue statin and aspirin,   *Acute renal failure on chronic renal failure chronic kidney disease stage III:  Improved  renal function. Baseline creatinine 1.58. Monitor kidney function closely. Received IV hydration.  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   SOB and wheezing site. Due  to bilateral pneumonia: Improving with antibiotics, nebulizers, steroids.   Deconditioning: Physical therapy recommends SNF/STR placement.   Hypernatremia secondary to dehydration: Started on D10 water. We will have to further adjust her insulin regimen secondary to D10 and history of diabetes.   likely discharge in 1-2 days if hypernatremia resolves. D/w wife/  D/w with the wife.    Code Status Orders        Start     Ordered   03/29/15 1527  Full code   Continuous     03/29/15 1526    Advance Directive Documentation        Most Recent Value   Type of Advance Directive  Healthcare Power of Attorney, Living will   Pre-existing out of facility DNR order (yellow form or pink MOST form)     "MOST" Form in Place?  Consults  infectious disease DVT Prophylaxis  heparin  Lab Results  Component Value Date   PLT 186 04/04/2015     Time Spent in minutes 53minutes   Cassie Shedlock M.D on 04/04/2015 at 9:52 AM  Between 7am to 6pm - Pager - [phone]  After 6pm go to [email or web address] - password [email or web address] [facility]   Office  [phone]

## 2015-04-04 NOTE — Plan of Care (Signed)
Problem: Safety: Goal: Ability to remain free from injury will improve Outcome: Progressing Pt remains free from injury.  Bed alarm on, bed in lowest position, call bell and phone within reach.     Problem: Physical Regulation: Goal: Will remain free from infection Outcome: Progressing Pt continues on IV abx

## 2015-04-04 NOTE — Progress Notes (Signed)
[facility] INFECTIOUS DISEASE PROGRESS NOTE Date of Admission:  03/29/2015     ID: [name] is a 74 y.o. male with pancreatic cancer and sepsis  Active Problems:   Sepsis ([place])   Fever   Pressure ulcer  Subjective: Remains afebrile,. Trying to work with PT today but still very weak, can barely sit at side of bed.   No cough,  ROS  Eleven systems are reviewed and negative except per hpi  Medications:  Antibiotics Given (last 72 hours)    Date/Time Action Medication Dose Rate   04/01/15 1908 Given   vancomycin (VANCOCIN) 1,250 mg in sodium chloride 0.9 % 250 mL IVPB 1,250 mg 166.7 mL/hr   04/01/15 2214 Given   piperacillin-tazobactam (ZOSYN) IVPB 3.375 g 3.375 g 12.5 mL/hr   04/02/15 0242 Given   piperacillin-tazobactam (ZOSYN) IVPB 3.375 g 3.375 g 12.5 mL/hr   04/02/15 1144 Given   piperacillin-tazobactam (ZOSYN) IVPB 3.375 g 3.375 g 12.5 mL/hr   04/02/15 1713 Given   vancomycin (VANCOCIN) 1,250 mg in sodium chloride 0.9 % 250 mL IVPB 1,250 mg 166.7 mL/hr   04/02/15 2032 Given   piperacillin-tazobactam (ZOSYN) IVPB 3.375 g 3.375 g 12.5 mL/hr   04/03/15 0237 Given   piperacillin-tazobactam (ZOSYN) IVPB 3.375 g 3.375 g 12.5 mL/hr   04/03/15 1058 Given   piperacillin-tazobactam (ZOSYN) IVPB 3.375 g 3.375 g 12.5 mL/hr   04/03/15 1742 Given   metroNIDAZOLE (FLAGYL) IVPB 500 mg 500 mg 100 mL/hr   04/03/15 1849 Given   cefTRIAXone (ROCEPHIN) 1 g in dextrose 5 % 50 mL IVPB 1 g 100 mL/hr   04/04/15 0044 Given   metroNIDAZOLE (FLAGYL) IVPB 500 mg 500 mg 100 mL/hr   04/04/15 0930 Given   metroNIDAZOLE (FLAGYL) IVPB 500 mg 500 mg 100 mL/hr     . aspirin EC  81 mg Oral Daily  . cefTRIAXone (ROCEPHIN)  IV  1 g Intravenous Q24H  . docusate sodium  200 mg Oral BID  . gabapentin  300 mg Oral BID  . heparin  5,000 Units Subcutaneous 3 times per day  . insulin aspart  0-15 Units Subcutaneous TID AC & HS  . insulin aspart  12 Units Subcutaneous TID WC  . insulin glargine   55 Units Subcutaneous BID  . lactulose  30 g Oral Daily  . meloxicam  7.5 mg Oral Daily  . [START ON 04/05/2015] methylPREDNISolone (SOLU-MEDROL) injection  60 mg Intravenous Q24H  . metronidazole  500 mg Intravenous Q8H  . pantoprazole  40 mg Oral QAC breakfast  . pravastatin  20 mg Oral q1800    Objective: Vital signs in last 24 hours: Temp:  [97.7 F (36.5 C)-98.4 F (36.9 C)] 97.7 F (36.5 C) (12/13 1228) Pulse Rate:  [69-76] 76 (12/13 1332) Resp:  [18-19] 19 (12/13 1228) BP: (149-173)/(61-75) 149/61 mmHg (12/13 1228) SpO2:  [94 %-96 %] 94 % (12/13 1332) Constitutional: He is oriented to person, place, and time. Obese HENT: Perrla, eomi, anicteric,  Mouth/Throat: Oropharynx is clear and dry . No oropharyngeal exudate.  Cardiovascular: Normal rate, regular rhythm and normal heart sounds.  Pulmonary/Chest: Effort normal and breath sounds normal. No respiratory distress. He has no wheezes.  Abdominal: Soft. Bowel sounds are normal. He exhibits no distension. Mild ttp RUQ Lymphadenopathy: He has no cervical adenopathy.  Neurological: He is alert and oriented to person, place, and time. Diffusely weak but nonfocal exam Skin: Skin is warm and dry. No rash noted. No erythema.  Psychiatric: He has  a normal mood and affect. His behavior is normal.  Ext LLE with 1+ edema  Lab Results  Recent Labs  04/03/15 0903 04/04/15 0547  WBC  --  6.5  HGB  --  10.1*  HCT  --  31.6*  NA  --  150*  K  --  3.9  CL  --  119*  CO2  --  25  BUN  --  51*  CREATININE 1.95* 1.78*    Microbiology: Results for orders placed or performed during the hospital encounter of 03/29/15  Urine culture     Status: None   Collection Time: 03/29/15  9:55 AM  Result Value Ref Range Status   Specimen Description URINE, RANDOM  Final   Special Requests NONE  Final   Culture 1,000 COLONIES/mL INSIGNIFICANT GROWTH   Final   Report Status 03/30/2015 FINAL  Final  Blood Culture (routine x 2)      Status: None   Collection Time: 03/29/15 10:00 AM  Result Value Ref Range Status   Specimen Description BLOOD LEFT HAND  Final   Special Requests BOTTLES DRAWN AEROBIC AND ANAEROBIC  11 CC  Final   Culture NO GROWTH 5 DAYS  Final   Report Status 04/03/2015 FINAL  Final  Blood Culture (routine x 2)     Status: None   Collection Time: 03/29/15 10:08 AM  Result Value Ref Range Status   Specimen Description BLOOD LEFT ARM  Final   Special Requests   Final    BOTTLES DRAWN AEROBIC AND ANAEROBIC  1CC ANAERO 3CC AERO   Culture NO GROWTH 5 DAYS  Final   Report Status 04/03/2015 FINAL  Final    Studies/Results: Ct Abdomen Pelvis W Contrast  03/29/2015  CLINICAL DATA:  74 year old male diagnosed with pancreatic moderately differentiated adenocarcinoma last month via endoscopic ultrasound biopsy. status post chemotherapy, radiation, stent placement. Abdominal pain and distension with fever for 2 days. Subsequent encounter. EXAM: CT ABDOMEN AND PELVIS WITH CONTRAST TECHNIQUE: Multidetector CT imaging of the abdomen and pelvis was performed using the standard protocol following bolus administration of intravenous contrast. CONTRAST:  62mL OMNIPAQUE IOHEXOL 300 MG/ML  SOLN COMPARISON:  CT Abdomen and Pelvis 02/14/2015 and earlier. FINDINGS: Stable cardiomegaly. No pericardial effusion. Small layering left pleural effusion is stable. Atelectasis at the lung bases. Previous sternotomy. No acute or metastatic osseous lesion identified. Stable small fat containing left inguinal hernia. No pelvic free fluid. Negative urinary bladder. Retained stool throughout the distal colon. Redundant sigmoid with some gaseous distension. Retained stool throughout the left colon, and more proximal colon. Redundant right colon. Normal appendix. Negative terminal ileum. No dilated small bowel. Small sliding-type gastric hiatal hernia. Otherwise negative stomach. CBD stent in place. Mild gaseous distension of the duodenum which  otherwise appears normal. Pneumobilia has not significantly changed. Pancreatic ductal dilatation has not significantly changed. Hypodense lesions at the pancreatic head and neck appears stable (series 2, images 31 and 32). Stable peripancreatic lymph node measuring 11 mm short axis situated between the IVC and main portal vein. Other porta hepatis nodes are stable measuring up to 12 mm short axis. Liver enhancement and gallbladder are within normal limits (small volume of pneumobilia related gas in the gallbladder). Spleen and adrenal glands within normal limits. Portal venous system is patent. Aortoiliac calcified atherosclerosis noted. Major arterial structures are patent. No other abdominal or pelvic lymphadenopathy bilateral initial phase renal enhancement is stable and within normal limits. On delayed images there is decreased left renal contrast excretion.  The right kidney appears stable. No abdominal free fluid. IMPRESSION: 1. Largely stable CT appearance of the abdomen since October. CBD stent in place. Hypodense pancreatic head and neck lesions with surrounding 11-12 mm short axis lymph nodes compatible with pancreatic adenocarcinoma. No distant metastatic disease identified in the abdomen or pelvis. 2. Lack of left renal contrast excretion on delayed images today suggesting a degree of left renal insufficiency. Electronically Signed   By: Genevie Ann M.D.   On: 03/29/2015 12:27   US Venous Img Lower Bilateral  03/31/2015  CLINICAL DATA:  Swelling. EXAM: BILATERAL LOWER EXTREMITY VENOUS DOPPLER ULTRASOUND TECHNIQUE: Gray-scale sonography with graded compression, as well as color Doppler and duplex ultrasound were performed to evaluate the lower extremity deep venous systems from the level of the common femoral vein and including the common femoral, femoral, profunda femoral, popliteal and calf veins including the posterior tibial, peroneal and gastrocnemius veins when visible. The superficial great  saphenous vein was also interrogated. Spectral Doppler was utilized to evaluate flow at rest and with distal augmentation maneuvers in the common femoral, femoral and popliteal veins. COMPARISON:  None. FINDINGS: RIGHT LOWER EXTREMITY Common Femoral Vein: No evidence of thrombus. Normal compressibility, respiratory phasicity and response to augmentation. Saphenofemoral Junction: No evidence of thrombus. Normal compressibility and flow on color Doppler imaging. Profunda Femoral Vein: No evidence of thrombus. Normal compressibility and flow on color Doppler imaging. Femoral Vein: No evidence of thrombus. Normal compressibility, respiratory phasicity and response to augmentation. Popliteal Vein: No evidence of thrombus. Normal compressibility, respiratory phasicity and response to augmentation. Calf Veins: No evidence of thrombus. Normal compressibility and flow on color Doppler imaging. Superficial Great Saphenous Vein: No evidence of thrombus. Normal compressibility and flow on color Doppler imaging. Other Findings:  None. LEFT LOWER EXTREMITY Common Femoral Vein: No evidence of thrombus. Normal compressibility, respiratory phasicity and response to augmentation. Saphenofemoral Junction: No evidence of thrombus. Normal compressibility and flow on color Doppler imaging. Profunda Femoral Vein: No evidence of thrombus. Normal compressibility and flow on color Doppler imaging. Femoral Vein: No evidence of thrombus. Normal compressibility, respiratory phasicity and response to augmentation. Popliteal Vein: No evidence of thrombus. Normal compressibility, respiratory phasicity and response to augmentation. Calf Veins: No evidence of thrombus. Normal compressibility and flow on color Doppler imaging. Superficial Great Saphenous Vein: No evidence of thrombus. Normal compressibility and flow on color Doppler imaging. Other Findings:  None. IMPRESSION: No evidence of deep venous thrombosis. Electronically Signed   By: Marcello Moores   Register   On: 03/31/2015 14:20   Dg Chest Port 1 View  04/01/2015  CLINICAL DATA:  74 year old with current history of pancreatic cancer, sepsis, and persistent fevers. EXAM: PORTABLE CHEST 1 VIEW COMPARISON:  03/29/2015 and earlier, including CT chest 02/25/2015. FINDINGS: Prior sternotomy for CABG. Cardiac silhouette moderately enlarged, unchanged. Pulmonary vascularity normal without evidence of pulmonary edema. Suboptimal inspiration with atelectasis in the lung bases, left greater than right, increased since the examination 3 days ago. No new pulmonary parenchymal abnormalities elsewhere. IMPRESSION: 1. Suboptimal inspiration accounts for bibasilar atelectasis, left greater than right, worse than the examination 3 days ago. 2. Stable cardiomegaly without pulmonary edema. 3. No new abnormalities elsewhere. Electronically Signed   By: Evangeline Dakin M.D.   On: 04/01/2015 17:04   Dg Chest Port 1 View  03/29/2015  CLINICAL DATA:  Fever.  Pancreatic cancer. EXAM: PORTABLE CHEST 1 VIEW COMPARISON:  Chest radiograph 02/25/2015.  CT chest 02/25/2015. FINDINGS: The heart is enlarged. 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 Thoracic atherosclerosis.  Low lung volumes. No definite active infiltrates or failure. No osseous findings. IMPRESSION: Low lung volumes. No definite active infiltrates or failure. Cardiomegaly. Electronically Signed   By: Staci Righter M.D.   On: 03/29/2015 10:39   Mr [name] Cm/mrcp  04/01/2015  CLINICAL DATA:  Pancreatic adenocarcinoma. EXAM: MRI ABDOMEN WITHOUT CONTRAST  (INCLUDING MRCP) TECHNIQUE: Multiplanar multisequence MR imaging of the abdomen was performed. Heavily T2-weighted images of the biliary and pancreatic ducts were obtained, and three-dimensional MRCP images were rendered by post processing. COMPARISON:  03/29/2015 FINDINGS: Images significantly degraded by motion artifact from breathing. Lower chest:  Small left pleural effusion.  Cardiomegaly. Hepatobiliary: Mild intrahepatic biliary  dilatation with the common bile duct measuring 1.6 cm. There could be some gas in the common bile duct. The CBD seems severely narrowed in the vicinity of the pancreatic head, with apparent fluid in the thin central lumen of the stent on images 20-25 of series 10 extending through this region. The stent itself is much harder to see on MR than on prior CT but is thought to still be present. No obvious enhancing hepatic mass seen, subject to reduce sensitivity from motion degradation of images. I am uncertain whether the apparent linear filling defects in the right and left portal veins are due to thrombus or artifact. Numerous tiny gallstones are present in the gallbladder. Pancreas: Dilated dorsal pancreatic duct with dilated side ducts and truncation in the pancreatic head again observed. Aside from the truncation, I do not see obvious differential enhancement in the pancreatic head to further signify the presence of the mass, although there is some soft tissue fullness in this vicinity. Spleen: Unremarkable Adrenals/Urinary Tract: Right mid kidney cyst measures about 8 mm diameter. Adrenal glands unremarkable. Stomach/Bowel: Unremarkable Vascular/Lymphatic: Known abdominal aortic atherosclerosis. A porta hepatis node measures 1.3 cm in short axis on image 18 series 7. Other: No supplemental non-categorized findings. Musculoskeletal: Unremarkable IMPRESSION: 1. Questionable linear filling defects in the right and left portal veins intrahepatic portions. Although quite likely from motion artifact, the appearance makes it difficult to confidently exclude the possibility of early portal vein thrombosis. Consider vascular ultrasound of the liver for further characterization. 2. Narrowing of the biliary tree in the vicinity of the pancreatic head. In this area, only the thing in the lumen of the stent is visualized as continuous. There is mild intrahepatic biliary dilatation and moderate extrahepatic biliary dilatation.  There is likely also some pneumobilia. 3. Numerous tiny gallstones in the gallbladder. 4. The pancreatic head mass is mainly observable due to truncation of the dilated dorsal pancreatic duct. Differential enhancement in this vicinity is not well seen. 5. Mild porta hepatis adenopathy. 6. Small left pleural effusion with cardiomegaly. 7. Images are degraded by motion artifact. Electronically Signed   By: Van Clines M.D.   On: 04/01/2015 11:03   Assessment/Plan: [name] is a 74 y.o. male with recently dxed pancreatic cancer s/p recent EUS and ERCP admitted Nov 5-9 with with sepsis and found to have Klebsiella Pna bacteremia. He was discharged on oral keflex for a 10 day course with thought that the likely source is his GI tract given pancreatic cancer and prior procedures. Readmitted 12/7 with Fever and weakness. He had recently started chemo with gemcitabine. On admit temp 102. Wbc 14.8. Has been started on vanco and zosyn. Ct scan abd pelvis shows no major change, no obstruction but does have continued pneumobilia . CXR negative. LFTs nml. UA neg. Not neutropenic.  No obvious  source of fever, leukocytosis. He also had diffuse weakness but no focal findings. I suspect he has a biliary source again given stent in place, pneumobilia.  Afebrile since 12/9 on vanco and zosyn (vanco added back for possible PNA) Stopped vanco 12/12 - zoxyn changed to ceftriaxone and flagyl Recommendations Cont  ceftriaxone and flagyl - will change to oral keflex and flagyl at dc Would rec a 21 day course of abx - stop date 12/28  I will sign off but please call with questions Thank you very much for allowing me to participate in the care of this patient.  [place], [place]   04/04/2015, 1:55 PM

## 2015-04-04 NOTE — Progress Notes (Addendum)
Initial Nutrition Assessment   INTERVENTION:   Meals and Snacks: Cater to patient preferences Medical Food Supplement Therapy: will recommend Sugar Free Mighty Shakes on meal trays TID for added nutrition (each shake provides 300kcals and 11g protein) Coordination of Care: recommend daily weights   NUTRITION DIAGNOSIS:   Inadequate oral intake related to acute illness as evidenced by per patient/family report.  GOAL:   Patient will meet greater than or equal to 90% of their needs  MONITOR:    (Energy Intake, Anthropometrics, Digestive System, Glucose Profile)  REASON FOR ASSESSMENT:   LOS (Pressure Ulcer)    ASSESSMENT:   Pt admitted with fever secondary to sepsis. Per MD note pt with h/o pancreatic cancer on chemotherapy and radiation. Pt s/p ERCP with stent placement in October 2016.  Past Medical History  Diagnosis Date  . Hyperlipidemia   . H/O cardiac catheterization 06/03/06,12/03/11  . BPH (benign prostatic hypertrophy)   . Anxiety   . Syncope     LAUGHING INDUCED  . Myocardial infarction ([place])     x 2  . CHF (congestive heart failure) ([place])   . CAD (coronary artery disease) 06/03/06    cypher 2.5 x 60mm DES for 80% mid RCA sees Dr. Ubaldo Glassing  . Asthma     as a child  . Sleep apnea     hx of  . Kidney stone     hx of  . GERD (gastroesophageal reflux disease)     on medication for  . Cancer ([place])     hx of skin ca in "corner of left eye"  . Arthritis   . Anemia     taking iron  . Cataract, bilateral   . Neuromuscular disorder ([place])     diabetic neuropathy  . Diabetes mellitus without complication ([place])   . Pancreatic adenocarcinoma ([place])     Diet Order:  Diet heart healthy/carb modified Room service appropriate?: Yes; Fluid consistency:: Thin    Current Nutrition: Pt reports eating a banana and cereal for breakfast. Pt reports poor appetite since admission. Recorded po intake on average 83% of meals on average but reports small meals.    Food/Nutrition-Related History: Pt reports eating 3 meals per day PTA. Pt reports drinking Glucerna in the past but not recently.    Scheduled Medications:  . aspirin EC  81 mg Oral Daily  . cefTRIAXone (ROCEPHIN)  IV  1 g Intravenous Q24H  . docusate sodium  200 mg Oral BID  . gabapentin  300 mg Oral BID  . heparin  5,000 Units Subcutaneous 3 times per day  . insulin aspart  0-15 Units Subcutaneous TID AC & HS  . insulin aspart  12 Units Subcutaneous TID WC  . insulin glargine  55 Units Subcutaneous BID  . lactulose  30 g Oral Daily  . meloxicam  7.5 mg Oral Daily  . [START ON 04/05/2015] methylPREDNISolone (SOLU-MEDROL) injection  60 mg Intravenous Q24H  . metronidazole  500 mg Intravenous Q8H  . pantoprazole  40 mg Oral QAC breakfast  . pravastatin  20 mg Oral q1800    Continuous Medications:  . dextrose 50 mL/hr at 04/04/15 0834     Electrolyte/Renal Profile and Glucose Profile:   Recent Labs Lab 03/29/15 0955  03/31/15 0452 04/01/15 0615 04/02/15 0540 04/03/15 0903 04/04/15 0547  NA 139  < > 143 144  --   --  150*  K 3.8  < > 3.9 3.4*  --   --  3.9  CL  109  < > 111 113*  --   --  119*  CO2 22  < > 25 24  --   --  25  BUN 31*  < > 37* 34*  --   --  51*  CREATININE 1.69*  < > 2.16* 2.10* 1.90* 1.95* 1.78*  CALCIUM 8.3*  < > 8.2* 8.2*  --   --  8.8*  MG 1.2*  --   --   --   --   --   --   PHOS 2.7  --   --   --   --   --   --   GLUCOSE 206*  < > 352* 206*  --   --  273*  < > = values in this interval not displayed. Protein Profile:  Recent Labs Lab 03/29/15 0955  ALBUMIN 2.8*    Gastrointestinal Profile: Last BM:  04/04/2015   Nutrition-Focused Physical Exam Findings:  Unable to complete Nutrition-Focused physical exam at this time.    Weight Change: Pt reports weighing himself every day BID with weight fluctuating. Pt does report overall weight loss of roughly 8lbs in 5-6weeks (5%)   Skin:   (Stage II Pressure Ulcer buttocks)   Height:   Ht  Readings from Last 1 Encounters:  03/29/15 5\' 8"  (1.727 m)    Weight:   Wt Readings from Last 1 Encounters:  03/29/15 207 lb 10.8 oz (94.2 kg)    Wt Readings from Last 10 Encounters:  03/29/15 207 lb 10.8 oz (94.2 kg)  03/23/15 203 lb 11.3 oz (92.4 kg)  03/09/15 205 lb 12.8 oz (93.35 kg)  03/08/15 202 lb (91.627 kg)  02/25/15 201 lb 11.2 oz (91.491 kg)  02/22/15 208 lb 5.4 oz (94.5 kg)  02/10/15 201 lb 3.2 oz (91.264 kg)  01/22/15 203 lb (92.08 kg)  10/27/14 207 lb 4.8 oz (94.031 kg)  01/07/12 162 lb (73.483 kg)     BMI:  Body mass index is 31.58 kg/(m^2).   EDUCATION NEEDS:   No education needs identified at this time   Estimated Nutritional Needs:  Kcals: BEE: 1345kcals,  TEE: (IF 1.1-1.3)(AF 1.2) 1776-2100kcals, using IBW of 63.6kg  Protein: 76-95g protein (1.2-1.5g/kg)  Fluid: 1590-1915mL of fluid (25-22mL/kg)   MODERATE Care Level  Dwyane Luo, RD, LDN Pager [phone]

## 2015-04-04 NOTE — Progress Notes (Signed)
Physical Therapy Treatment Patient Details Name: [name] MRN: [id number] DOB: 05/26/1940 Today's Date: 04/04/2015    History of Present Illness Patient is a 74 y/o male with recent diagnosis of pancreatic cancer. Patient admitted with fevers and difficulty ambulating for several days.     PT Comments    Pt demonstrating improvement with participation, strength, bed mobility, transfers and short ambulation today. Pt able to transfer bed to chair. Pt retropulsive requiring Mod A. Pt fatigues easily. Transfer status discussed with nursing assistant. Encouraged pt to be up in chair as tolerated for an hour or two. Continue PT to progress strength, endurance, transfers and ambulation to improve functional mobility.   Follow Up Recommendations  SNF     Equipment Recommendations       Recommendations for Other Services       Precautions / Restrictions Restrictions Weight Bearing Restrictions: No    Mobility  Bed Mobility Overal bed mobility: Needs Assistance Bed Mobility: Supine to Sit     Supine to sit: Min assist     General bed mobility comments: slow moving;assist primarily for trunk  Transfers Overall transfer level: Needs assistance Equipment used: Rolling walker (2 wheeled) Transfers: Sit to/from Stand Sit to Stand: Mod assist (retropulsive)         General transfer comment: cues for safe hand placeent; retropulsive; leans against bed  Ambulation/Gait Ambulation/Gait assistance: Mod assist Ambulation Distance (Feet): 3 Feet Assistive device: Rolling walker (2 wheeled) Gait Pattern/deviations: Leaning posteriorly (side steps bed to chair; heavy posterior lean) Gait velocity: reduced Gait velocity interpretation: <1.8 ft/sec, indicative of risk for recurrent falls General Gait Details: Mod A for heavy retropulsion. able to take small steps clearing feet bed to chair   Stairs            Wheelchair Mobility    Modified Rankin (Stroke Patients  Only)       Balance Overall balance assessment: Needs assistance Sitting-balance support: Bilateral upper extremity supported Sitting balance-Leahy Scale: Fair     Standing balance support: Bilateral upper extremity supported Standing balance-Leahy Scale: Poor                      Cognition Arousal/Alertness: Awake/alert Behavior During Therapy: WFL for tasks assessed/performed Overall Cognitive Status: Within Functional Limits for tasks assessed                      Exercises General Exercises - Lower Extremity Ankle Circles/Pumps: AROM;Both;20 reps;Supine Quad Sets: Strengthening;Both;20 reps;Supine Gluteal Sets: Strengthening;Both;20 reps;Supine Short Arc Quad: AROM;Both;20 reps;Supine Heel Slides: Both;20 reps;Supine;AAROM Hip ABduction/ADduction: Both;20 reps;Supine;AAROM    General Comments        Pertinent Vitals/Pain Pain Assessment: 0-10 Pain Score: 6  Pain Location: abdomen Pain Intervention(s): Premedicated before session    Home Living                      Prior Function            PT Goals (current goals can now be found in the care plan section) Progress towards PT goals: Progressing toward goals    Frequency  Min 2X/week    PT Plan Current plan remains appropriate    Co-evaluation             End of Session Equipment Utilized During Treatment: Gait belt Activity Tolerance: Patient tolerated treatment well;No increased pain;Patient limited by fatigue Patient left: in chair;with call bell/phone within reach;with family/visitor present (alarm  pad in place; nsg asst to obtain box to set alarm)     Time: [id number] PT Time Calculation (min) (ACUTE ONLY): 31 min  Charges:  $Gait Training: 8-22 mins $Therapeutic Exercise: 8-22 mins                    G Codes:      Charlaine Dalton 04/04/2015, 2:18 PM

## 2015-04-05 ENCOUNTER — Ambulatory Visit: Payer: Commercial Managed Care - HMO

## 2015-04-05 LAB — BASIC METABOLIC PANEL
Anion gap: 7 (ref 5–15)
BUN: 46 mg/dL — ABNORMAL HIGH (ref 6–20)
CALCIUM: 8.1 mg/dL — AB (ref 8.9–10.3)
CHLORIDE: 111 mmol/L (ref 101–111)
CO2: 26 mmol/L (ref 22–32)
CREATININE: 1.43 mg/dL — AB (ref 0.61–1.24)
GFR calc non Af Amer: 47 mL/min — ABNORMAL LOW (ref >60)
GFR, EST AFRICAN AMERICAN: 54 mL/min — AB (ref >60)
Glucose, Bld: 242 mg/dL — ABNORMAL HIGH (ref 65–99)
POTASSIUM: 3.3 mmol/L — AB (ref 3.5–5.1)
SODIUM: 144 mmol/L (ref 135–145)

## 2015-04-05 LAB — GLUCOSE, CAPILLARY
GLUCOSE-CAPILLARY: 216 mg/dL — AB (ref 65–99)
GLUCOSE-CAPILLARY: 278 mg/dL — AB (ref 65–99)
GLUCOSE-CAPILLARY: 155 mg/dL — AB (ref 65–99)
Glucose-Capillary: 252 mg/dL — ABNORMAL HIGH (ref 65–99)

## 2015-04-05 MED ORDER — POTASSIUM CHLORIDE 20 MEQ/15ML (10%) PO SOLN
40.0000 meq | Freq: Once | ORAL | Status: AC
  Administered 2015-04-05: 40 meq via ORAL
  Filled 2015-04-05: qty 30

## 2015-04-05 MED ORDER — PREDNISONE 50 MG PO TABS
50.0000 mg | ORAL_TABLET | Freq: Every day | ORAL | Status: DC
  Administered 2015-04-06: 50 mg via ORAL
  Filled 2015-04-05: qty 1

## 2015-04-05 MED ORDER — ZINC OXIDE 40 % EX OINT
TOPICAL_OINTMENT | Freq: Two times a day (BID) | CUTANEOUS | Status: DC
  Administered 2015-04-05 – 2015-04-06 (×3): via TOPICAL
  Filled 2015-04-05: qty 114

## 2015-04-05 MED ORDER — INSULIN ASPART 100 UNIT/ML ~~LOC~~ SOLN
15.0000 [IU] | Freq: Three times a day (TID) | SUBCUTANEOUS | Status: DC
  Administered 2015-04-05 – 2015-04-06 (×3): 15 [IU] via SUBCUTANEOUS
  Filled 2015-04-05: qty 3
  Filled 2015-04-05 (×4): qty 15

## 2015-04-05 NOTE — Progress Notes (Signed)
[place] [facility]                                                                                                                                                                                            Patient Demographics   [name], is a 74 y.o. male, DOB - 06-02-1940, [id number]  Admit date - 03/29/2015   Admitting Physician Vaughan Basta, MD  Outpatient Primary MD for the patient is Rusty Aus., MD   LOS - 7  Subjective: Since seen in the room, in the chair. No wheezing. Minimal abdominal pain.   Review of Systems:   CONSTITUTIONAL:  , fatigue present. EYES: No blurry or double vision.  ENT: No tinnitus. No postnasal drip. No redness of the oropharynx.  RESPIRATORY: No wheezing, no shortness of breath CARDIOVASCULAR: No chest pain. No orthopnea. No palpitations. No syncope.  GASTROINTESTINAL:Complains of constipation, abdominal pain.  GENITOURINARY: No dysuria or hematuria.  ENDOCRINE: No polyuria or nocturia. No heat or cold intolerance.  HEMATOLOGY: No anemia. No bruising. No bleeding.  INTEGUMENTARY: No rashes. No lesions.  MUSCULOSKELETAL: No arthritis. No swelling. No gout.  NEUROLOGIC: No numbness, tingling, or ataxia. No seizure-type activity.  PSYCHIATRIC: No anxiety. No insomnia. No ADD.    Vitals:   Filed Vitals:   04/04/15 2039 04/05/15 0500 04/05/15 0526 04/05/15 1019  BP: 165/79  161/72   Pulse: 65  64 66  Temp: 97.6 F (36.4 C)  97.8 F (36.6 C)   TempSrc: Oral  Oral   Resp: 18  17   Height:      Weight:  98.158 kg (216 lb 6.4 oz)    SpO2: 98%  97% 96%    Wt Readings from Last 3 Encounters:  04/05/15 98.158 kg (216 lb 6.4 oz)  03/23/15 92.4 kg (203 lb 11.3 oz)  03/09/15 93.35 kg (205 lb 12.8 oz)     Intake/Output Summary (Last 24 hours) at 04/05/15 1129 Last data filed at 04/05/15 0901  Gross per 24 hour  Intake    480 ml  Output      0 ml  Net    480 ml    Physical Exam:    GENERAL: Pleasant-appearing in no apparent distress. Chronically ill-appearing male HEAD, EYES, EARS, NOSE AND THROAT: Atraumatic, normocephalic. Extraocular muscles are intact. Pupils equal and reactive to light. Sclerae anicteric. No conjunctival injection. No oro-pharyngeal erythema.  NECK: Supple. There is no jugular venous distention. No bruits, no lymphadenopathy, no thyromegaly.  HEART: Regular rate and rhythm,. No murmurs, no rubs, no clicks.  LUNGS: clear To auscultation. no wheezing  ABDOMEN:  Soft, flat, mild epigastric tenderness, nondistended. Has good bowel sounds. No hepatosplenomegaly appreciated.  EXTREMITIES: No evidence of any cyanosis, clubbing, or peripheral edema.  +2 pedal and radial pulses bilaterally.  NEUROLOGIC: The patient is alert, awake, and oriented x3 with no focal motor or sensory deficits appreciated bilaterally.  SKIN: Moist and warm with no rashes appreciated.  Psych: Not anxious, depressed LN: No inguinal LN enlargement    Antibiotics   Anti-infectives    Start     Dose/Rate Route Frequency Ordered Stop   04/03/15 1745  cefTRIAXone (ROCEPHIN) 1 g in dextrose 5 % 50 mL IVPB     1 g 100 mL/hr over 30 Minutes Intravenous Every 24 hours 04/03/15 1732     04/03/15 1700  metroNIDAZOLE (FLAGYL) IVPB 500 mg     500 mg 100 mL/hr over 60 Minutes Intravenous Every 8 hours 04/03/15 1646     04/01/15 1800  vancomycin (VANCOCIN) 1,250 mg in sodium chloride 0.9 % 250 mL IVPB  Status:  Discontinued     1,250 mg 166.7 mL/hr over 90 Minutes Intravenous Every 24 hours 04/01/15 1733 04/03/15 1646   04/01/15 1730  vancomycin (VANCOCIN) IVPB 1000 mg/200 mL premix  Status:  Discontinued     1,000 mg 200 mL/hr over 60 Minutes Intravenous  Once 04/01/15 1721 04/01/15 1733   03/29/15 2200  vancomycin (VANCOCIN) IVPB 1000 mg/200 mL premix  Status:  Discontinued     1,000 mg 200 mL/hr over 60 Minutes Intravenous Every 18 hours 03/29/15 1324 03/30/15 1518   03/29/15 1900   piperacillin-tazobactam (ZOSYN) IVPB 3.375 g  Status:  Discontinued     3.375 g 12.5 mL/hr over 240 Minutes Intravenous Every 8 hours 03/29/15 1324 04/03/15 1646   03/29/15 1000  piperacillin-tazobactam (ZOSYN) IVPB 3.375 g     3.375 g 100 mL/hr over 30 Minutes Intravenous  Once 03/29/15 0949 03/29/15 1340   03/29/15 1000  vancomycin (VANCOCIN) 1,500 mg in sodium chloride 0.9 % 500 mL IVPB     1,500 mg 250 mL/hr over 120 Minutes Intravenous  Once 03/29/15 0949 03/29/15 1238      Medications   Scheduled Meds: . aspirin EC  81 mg Oral Daily  . cefTRIAXone (ROCEPHIN)  IV  1 g Intravenous Q24H  . docusate sodium  200 mg Oral BID  . gabapentin  300 mg Oral BID  . heparin  5,000 Units Subcutaneous 3 times per day  . insulin aspart  0-15 Units Subcutaneous TID AC & HS  . insulin aspart  12 Units Subcutaneous TID WC  . insulin glargine  55 Units Subcutaneous BID  . lactulose  30 g Oral Daily  . meloxicam  7.5 mg Oral Daily  . methylPREDNISolone (SOLU-MEDROL) injection  60 mg Intravenous Q24H  . metronidazole  500 mg Intravenous Q8H  . pantoprazole  40 mg Oral QAC breakfast  . pravastatin  20 mg Oral q1800   Continuous Infusions: . dextrose 50 mL/hr at 04/05/15 0100   PRN Meds:.acetaminophen, HYDROmorphone (DILAUDID) injection, ipratropium-albuterol, ondansetron, oxyCODONE-acetaminophen, polyethylene glycol powder   Data Review:   Micro Results Recent Results (from the past 240 hour(s))  Urine culture     Status: None   Collection Time: 03/29/15  9:55 AM  Result Value Ref Range Status   Specimen Description URINE, RANDOM  Final   Special Requests NONE  Final   Culture 1,000 COLONIES/mL INSIGNIFICANT GROWTH   Final   Report Status 03/30/2015 FINAL  Final  Blood Culture (routine x 2)  Status: None   Collection Time: 03/29/15 10:00 AM  Result Value Ref Range Status   Specimen Description BLOOD LEFT HAND  Final   Special Requests BOTTLES DRAWN AEROBIC AND ANAEROBIC  11 CC   Final   Culture NO GROWTH 5 DAYS  Final   Report Status 04/03/2015 FINAL  Final  Blood Culture (routine x 2)     Status: None   Collection Time: 03/29/15 10:08 AM  Result Value Ref Range Status   Specimen Description BLOOD LEFT ARM  Final   Special Requests   Final    BOTTLES DRAWN AEROBIC AND ANAEROBIC  1CC ANAERO 3CC AERO   Culture NO GROWTH 5 DAYS  Final   Report Status 04/03/2015 FINAL  Final    Radiology Reports Ct Abdomen Pelvis W Contrast  03/29/2015  CLINICAL DATA:  74 year old male diagnosed with pancreatic moderately differentiated adenocarcinoma last month via endoscopic ultrasound biopsy. status post chemotherapy, radiation, stent placement. Abdominal pain and distension with fever for 2 days. Subsequent encounter. EXAM: CT ABDOMEN AND PELVIS WITH CONTRAST TECHNIQUE: Multidetector CT imaging of the abdomen and pelvis was performed using the standard protocol following bolus administration of intravenous contrast. CONTRAST:  58mL OMNIPAQUE IOHEXOL 300 MG/ML  SOLN COMPARISON:  CT Abdomen and Pelvis 02/14/2015 and earlier. FINDINGS: Stable cardiomegaly. No pericardial effusion. Small layering left pleural effusion is stable. Atelectasis at the lung bases. Previous sternotomy. No acute or metastatic osseous lesion identified. Stable small fat containing left inguinal hernia. No pelvic free fluid. Negative urinary bladder. Retained stool throughout the distal colon. Redundant sigmoid with some gaseous distension. Retained stool throughout the left colon, and more proximal colon. Redundant right colon. Normal appendix. Negative terminal ileum. No dilated small bowel. Small sliding-type gastric hiatal hernia. Otherwise negative stomach. CBD stent in place. Mild gaseous distension of the duodenum which otherwise appears normal. Pneumobilia has not significantly changed. Pancreatic ductal dilatation has not significantly changed. Hypodense lesions at the pancreatic head and neck appears stable  (series 2, images 31 and 32). Stable peripancreatic lymph node measuring 11 mm short axis situated between the IVC and main portal vein. Other porta hepatis nodes are stable measuring up to 12 mm short axis. Liver enhancement and gallbladder are within normal limits (small volume of pneumobilia related gas in the gallbladder). Spleen and adrenal glands within normal limits. Portal venous system is patent. Aortoiliac calcified atherosclerosis noted. Major arterial structures are patent. No other abdominal or pelvic lymphadenopathy bilateral initial phase renal enhancement is stable and within normal limits. On delayed images there is decreased left renal contrast excretion. The right kidney appears stable. No abdominal free fluid. IMPRESSION: 1. Largely stable CT appearance of the abdomen since October. CBD stent in place. Hypodense pancreatic head and neck lesions with surrounding 11-12 mm short axis lymph nodes compatible with pancreatic adenocarcinoma. No distant metastatic disease identified in the abdomen or pelvis. 2. Lack of left renal contrast excretion on delayed images today suggesting a degree of left renal insufficiency. Electronically Signed   By: Genevie Ann M.D.   On: 03/29/2015 12:27   US Venous Img Lower Bilateral  03/31/2015  CLINICAL DATA:  Swelling. EXAM: BILATERAL LOWER EXTREMITY VENOUS DOPPLER ULTRASOUND TECHNIQUE: Gray-scale sonography with graded compression, as well as color Doppler and duplex ultrasound were performed to evaluate the lower extremity deep venous systems from the level of the common femoral vein and including the common femoral, femoral, profunda femoral, popliteal and calf veins including the posterior tibial, peroneal and gastrocnemius veins when visible.  The superficial great saphenous vein was also interrogated. Spectral Doppler was utilized to evaluate flow at rest and with distal augmentation maneuvers in the common femoral, femoral and popliteal veins. COMPARISON:  None.  FINDINGS: RIGHT LOWER EXTREMITY Common Femoral Vein: No evidence of thrombus. Normal compressibility, respiratory phasicity and response to augmentation. Saphenofemoral Junction: No evidence of thrombus. Normal compressibility and flow on color Doppler imaging. Profunda Femoral Vein: No evidence of thrombus. Normal compressibility and flow on color Doppler imaging. Femoral Vein: No evidence of thrombus. Normal compressibility, respiratory phasicity and response to augmentation. Popliteal Vein: No evidence of thrombus. Normal compressibility, respiratory phasicity and response to augmentation. Calf Veins: No evidence of thrombus. Normal compressibility and flow on color Doppler imaging. Superficial Great Saphenous Vein: No evidence of thrombus. Normal compressibility and flow on color Doppler imaging. Other Findings:  None. LEFT LOWER EXTREMITY Common Femoral Vein: No evidence of thrombus. Normal compressibility, respiratory phasicity and response to augmentation. Saphenofemoral Junction: No evidence of thrombus. Normal compressibility and flow on color Doppler imaging. Profunda Femoral Vein: No evidence of thrombus. Normal compressibility and flow on color Doppler imaging. Femoral Vein: No evidence of thrombus. Normal compressibility, respiratory phasicity and response to augmentation. Popliteal Vein: No evidence of thrombus. Normal compressibility, respiratory phasicity and response to augmentation. Calf Veins: No evidence of thrombus. Normal compressibility and flow on color Doppler imaging. Superficial Great Saphenous Vein: No evidence of thrombus. Normal compressibility and flow on color Doppler imaging. Other Findings:  None. IMPRESSION: No evidence of deep venous thrombosis. Electronically Signed   By: Marcello Moores  Register   On: 03/31/2015 14:20   Dg Chest Port 1 View  04/01/2015  CLINICAL DATA:  74 year old with current history of pancreatic cancer, sepsis, and persistent fevers. EXAM: PORTABLE CHEST 1 VIEW  COMPARISON:  03/29/2015 and earlier, including CT chest 02/25/2015. FINDINGS: Prior sternotomy for CABG. Cardiac silhouette moderately enlarged, unchanged. Pulmonary vascularity normal without evidence of pulmonary edema. Suboptimal inspiration with atelectasis in the lung bases, left greater than right, increased since the examination 3 days ago. No new pulmonary parenchymal abnormalities elsewhere. IMPRESSION: 1. Suboptimal inspiration accounts for bibasilar atelectasis, left greater than right, worse than the examination 3 days ago. 2. Stable cardiomegaly without pulmonary edema. 3. No new abnormalities elsewhere. Electronically Signed   By: Evangeline Dakin M.D.   On: 04/01/2015 17:04   Dg Chest Port 1 View  03/29/2015  CLINICAL DATA:  Fever.  Pancreatic cancer. EXAM: PORTABLE CHEST 1 VIEW COMPARISON:  Chest radiograph 02/25/2015.  CT chest 02/25/2015. FINDINGS: The heart is enlarged. 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 Thoracic atherosclerosis. Low lung volumes. No definite active infiltrates or failure. No osseous findings. IMPRESSION: Low lung volumes. No definite active infiltrates or failure. Cardiomegaly. Electronically Signed   By: Staci Righter M.D.   On: 03/29/2015 10:39   Mr [name] Cm/mrcp  04/01/2015  CLINICAL DATA:  Pancreatic adenocarcinoma. EXAM: MRI ABDOMEN WITHOUT CONTRAST  (INCLUDING MRCP) TECHNIQUE: Multiplanar multisequence MR imaging of the abdomen was performed. Heavily T2-weighted images of the biliary and pancreatic ducts were obtained, and three-dimensional MRCP images were rendered by post processing. COMPARISON:  03/29/2015 FINDINGS: Images significantly degraded by motion artifact from breathing. Lower chest:  Small left pleural effusion.  Cardiomegaly. Hepatobiliary: Mild intrahepatic biliary dilatation with the common bile duct measuring 1.6 cm. There could be some gas in the common bile duct. The CBD seems severely narrowed in the vicinity of the pancreatic head, with apparent fluid in the  thin central lumen of the stent on images  20-25 of series 10 extending through this region. The stent itself is much harder to see on MR than on prior CT but is thought to still be present. No obvious enhancing hepatic mass seen, subject to reduce sensitivity from motion degradation of images. I am uncertain whether the apparent linear filling defects in the right and left portal veins are due to thrombus or artifact. Numerous tiny gallstones are present in the gallbladder. Pancreas: Dilated dorsal pancreatic duct with dilated side ducts and truncation in the pancreatic head again observed. Aside from the truncation, I do not see obvious differential enhancement in the pancreatic head to further signify the presence of the mass, although there is some soft tissue fullness in this vicinity. Spleen: Unremarkable Adrenals/Urinary Tract: Right mid kidney cyst measures about 8 mm diameter. Adrenal glands unremarkable. Stomach/Bowel: Unremarkable Vascular/Lymphatic: Known abdominal aortic atherosclerosis. A porta hepatis node measures 1.3 cm in short axis on image 18 series 7. Other: No supplemental non-categorized findings. Musculoskeletal: Unremarkable IMPRESSION: 1. Questionable linear filling defects in the right and left portal veins intrahepatic portions. Although quite likely from motion artifact, the appearance makes it difficult to confidently exclude the possibility of early portal vein thrombosis. Consider vascular ultrasound of the liver for further characterization. 2. Narrowing of the biliary tree in the vicinity of the pancreatic head. In this area, only the thing in the lumen of the stent is visualized as continuous. There is mild intrahepatic biliary dilatation and moderate extrahepatic biliary dilatation. There is likely also some pneumobilia. 3. Numerous tiny gallstones in the gallbladder. 4. The pancreatic head mass is mainly observable due to truncation of the dilated dorsal pancreatic duct.  Differential enhancement in this vicinity is not well seen. 5. Mild porta hepatis adenopathy. 6. Small left pleural effusion with cardiomegaly. 7. Images are degraded by motion artifact. Electronically Signed   By: Van Clines M.D.   On: 04/01/2015 11:03     CBC  Recent Labs Lab 03/30/15 0805 03/31/15 0452 04/01/15 0615 04/04/15 0547  WBC 11.4* 7.9 6.5 6.5  HGB 9.6* 9.0* 8.8* 10.1*  HCT 30.7* 28.5* 27.7* 31.6*  PLT 149* 162 153 186  MCV 86.3 86.6 86.0 85.7  MCH 27.0 27.4 27.3 27.3  MCHC 31.2* 31.6* 31.8* 31.8*  RDW 16.0* 15.7* 15.4* 15.3*    Chemistries   Recent Labs Lab 03/30/15 0805 03/31/15 0452 04/01/15 0615 04/02/15 0540 04/03/15 0903 04/04/15 0547 04/05/15 0535  NA 142 143 144  --   --  150* 144  K 4.3 3.9 3.4*  --   --  3.9 3.3*  CL 111 111 113*  --   --  119* 111  CO2 23 25 24   --   --  25 26  GLUCOSE 299* 352* 206*  --   --  273* 242*  BUN 32* 37* 34*  --   --  51* 46*  CREATININE 1.87* 2.16* 2.10* 1.90* 1.95* 1.78* 1.43*  CALCIUM 8.3* 8.2* 8.2*  --   --  8.8* 8.1*   ------------------------------------------------------------------------------------------------------------------ estimated creatinine clearance is 51.5 mL/min (by C-G formula based on Cr of 1.43). ------------------------------------------------------------------------------------------------------------------ No results for input(s): HGBA1C in the last 72 hours. ------------------------------------------------------------------------------------------------------------------ No results for input(s): CHOL, HDL, LDLCALC, TRIG, CHOLHDL, LDLDIRECT in the last 72 hours. ------------------------------------------------------------------------------------------------------------------ No results for input(s): TSH, T4TOTAL, T3FREE, THYROIDAB in the last 72 hours.  Invalid input(s):  FREET3 ------------------------------------------------------------------------------------------------------------------ No results for input(s): VITAMINB12, FOLATE, FERRITIN, TIBC, IRON, RETICCTPCT in the last 72 hours.  Coagulation profile No results for  input(s): INR, PROTIME in the last 168 hours.  No results for input(s): DDIMER in the last 72 hours.  Cardiac Enzymes No results for input(s): CKMB, TROPONINI, MYOGLOBIN in the last 168 hours.  Invalid input(s): CK ------------------------------------------------------------------------------------------------------------------ Invalid input(s): POCBNP    Assessment & Plan   * Sepsis due to pneumobilia: Bilateral pneumonia: Continue vancomycin, Zosyn. Vancomycin started yesterday secondary to wheezing, pneumonia found on x-ray to cover for MRSA. Patient has no fevers. Breathing is better. No wheezing today. Seen by GI. Thinks that stent is in place and no further GI workup is planned Given normal LFT's, unlikely that fever is related to biliary stent.  Discussed  with the patient.* In by ID, recommend discontinuing Vanco, starting ceftriaxone and Flagyl.    Adenocarcinoma of the pancreas Follows with cancer Center, recently started on chemotherapy and radiation. Prognosis poor . 2 deconditioning he may not tolerate further chemotherapy. Radiation oncology has been contacted for palliative radiation therapy. * Diabetes; Hyperglycemia secondary to steroid use: Added mealtime coverage, increased the Lantus, continue sliding-scale coverage.  * History of coronary artery disease Continue statin and aspirin,   *Acute renal failure on chronic renal failure chronic kidney disease stage III:  Improved  renal function. Baseline creatinine 1.58. Monitor kidney function closely. Discontinue IV fluids.  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   SOB and wheezing site. Due  to  bilateral pneumonia: Improving with antibiotics, nebulizers, steroids.   Deconditioning: Physical therapy recommends SNF/STR placement. likley discharge tomorrow.   Hypernatremia secondary to dehydration: Improved  D/w wife/      Code Status Orders        Start     Ordered   03/29/15 1527  Full code   Continuous     03/29/15 1526    Advance Directive Documentation        Most Recent Value   Type of Advance Directive  Healthcare Power of Attorney, Living will   Pre-existing out of facility DNR order (yellow form or pink MOST form)     "MOST" Form in Place?             Consults  infectious disease DVT Prophylaxis  heparin  Lab Results  Component Value Date   PLT 186 04/04/2015     Time Spent in minutes 55minutes   Teneisha Gignac M.D on 04/05/2015 at 11:29 AM  Between 7am to 6pm - Pager - [phone]  After 6pm go to [email or web address] - password [email or web address]   Office  [phone]

## 2015-04-05 NOTE — Progress Notes (Signed)
[name]   DOB:01-19-1941   [id number]    Subjective: Fevers improved. However patient continues to feel extremely weak; shaky.  He has been able to work with physical therapy yesterday. Review of system: mild shortness of breath; no cough. Swelling in the legs.  Objective:  Filed Vitals:   04/05/15 0526 04/05/15 1019  BP: 161/72   Pulse: 64 66  Temp: 97.8 F (36.6 C)   Resp: 17      Intake/Output Summary (Last 24 hours) at 04/05/15 1307 Last data filed at 04/05/15 0901  Gross per 24 hour  Intake    480 ml  Output      0 ml  Net    480 ml    GENERAL:  Alert, no acute distress.  He appears nontoxic.Marland Kitchen Accompanied by wife. He is sitting in the bed eating breakfast. EYES: No icterus OROPHARYNX: no thrush or ulceration; poor dentition NECK: supple, no masses felt LUNGS:decreased breath sounds bilaterally at the bases. No wheeze or crackles HEART/CVS: regular rate & rhythm and no murmurs; 1+ bilateral lower extremity edema ABDOMEN: abdomen soft, non-tender and normal bowel sounds Musculoskeletal:no cyanosis of digits and no clubbing  PSYCH: alert & oriented x 3 with flat affect/appears depressed NEURO: no focal motor/sensory deficits; generalized weakness present.he has coarse tremors bilateral upper extremities.  SKIN: no rashes or significant lesions  Labs:  Lab Results  Component Value Date   WBC 6.5 04/04/2015   HGB 10.1* 04/04/2015   HCT 31.6* 04/04/2015   MCV 85.7 04/04/2015   PLT 186 04/04/2015   NEUTROABS 13.5* 03/29/2015    Lab Results  Component Value Date   NA 144 04/05/2015   K 3.3* 04/05/2015   CL 111 04/05/2015   CO2 26 04/05/2015    Studies:  No results found.  Assessment & Plan:   74 year old male patient with a history of stage II pancreatic cancer currently status post cycle #1 chemotherapy and radiation admitted to the hospital for high-grade fevers.   # fever-unclear etiology/improving; patient on IV antibiotics.  Reviewed the  recommendations from ID.  Plan to switch to oral antibiotics at discharge  # Pancreatic cancer-stage II B-No obvious progression of the cancer noted on the MRCP of the liver.  Given the overall deconditioning /multiple admission to hospital - I think patient will have poor tolerance to chemotherapy.   Plan palliative radiation outpatient/ next week.  I discussed this with Dr. Donella Stade.  # Deconditioning- patient will likely need rehabilitation. Discussed at length with the patient's wife.  Reviewed the recommendations of physical therapy-  Re:  Discharge to a skilled facility.  #  All questions were answered. The above plan of care was discussed with the patient and his wife.   Cammie Sickle, MD 04/05/2015  1:07 PM

## 2015-04-05 NOTE — Care Management Important Message (Signed)
Important Message  Patient Details  Name: [name] MRN: [id number] Date of Birth: Jun 05, 1940   Medicare Important Message Given:  Yes    Juliann Pulse A Pallavi Clifton 04/05/2015, 10:29 AM

## 2015-04-05 NOTE — Plan of Care (Signed)
Pt has been confused today, could be related to sodium being 150.  D5 started at 33ml/hr.  Pt complaining of increased thirst, explained that this could also be the body's way of trying to correct sodium.  Pt very upset about possible SNF for rehab, educated pt on benefit of short term SNF for rehab vs rehab in the home.  Pt c/o pain in buttocks, pt has stage 2 pressure ulcers, prn meds given with improvement as well as skin care and off loading.  Pt repositioned Q2hr.  Problem: Activity: Goal: Ability to implement measures to reduce episodes of fatigue will improve Outcome: Progressing Pt is moderate falls, up in room unassisted, call bell and phone within reach, call out when assistance is needed  Problem: Education: Goal: Knowledge of the prescribed therapeutic regimen will improve Outcome: Progressing Pt given information guide and educated on using call system. Verbalized understanding  Problem: Coping: Goal: Ability to identify and develop effective coping behavior will improve Outcome: Progressing Pt is calm and cooperative.   Problem: Nutritional: Goal: Maintenance of adequate nutrition will improve Outcome: Progressing Pt had good po intake. Snacks given.

## 2015-04-05 NOTE — Plan of Care (Signed)
Problem: Physical Regulation: Goal: Signs and symptoms of infection will decrease Outcome: Progressing Patient is alert and oriented. VSS. Patient receiving IV antibiotics. Telemetry monitored. Worked with PT, up to chair. Pain medication given with noted relief.

## 2015-04-05 NOTE — Clinical Social Work Note (Signed)
Clinical Social Worker initiated SNF search for pt. CSW explained to pt and wife to process of discharging to SNF with a Managed Medicare. Pt has declined EMS transport at time of discharge due to cost. CSW will follow up with bed offers. CSW will continue to follow.   Darden Dates, MSW, LCSW Clinical Social Worker  [phone]

## 2015-04-05 NOTE — Progress Notes (Signed)
Dr Vianne Bulls- apply desitin topically BID to perineum

## 2015-04-05 NOTE — Plan of Care (Signed)
Problem: Safety: Goal: Ability to remain free from injury will improve Outcome: Progressing Pt remains free from injury.  Fall precautions in place.  Pt calls appropriately with needs.  Problem: Health Behavior/Discharge Planning: Goal: Ability to manage health-related needs will improve Outcome: Progressing Pt continues on IV ABX.    Problem: Skin Integrity: Goal: Risk for impaired skin integrity will decrease Outcome: Progressing Pt refuses to be turned by staff every two hours.  States that he can turn himself when he's uncomfortable.

## 2015-04-05 NOTE — Clinical Social Work Note (Signed)
Clinical Social Worker gave bed offers. Pt and wife chose [facility]. Pt will likely be ready for discharge tomorrow (04/06/2015). CSW will continue to follow.   Darden Dates, MSW, LCSW Clinical Social Worker  [phone]

## 2015-04-05 NOTE — Clinical Social Work Placement (Signed)
   CLINICAL SOCIAL WORK PLACEMENT  NOTE  Date:  04/05/2015  Patient Details  Name: [name] MRN: [id number] Date of Birth: [birth year]  Clinical Social Work is seeking post-discharge placement for this patient at the [place] level of care (*CSW will initial, date and re-position this form in  chart as items are completed):  Yes   Patient/family provided with [place] Work Department's list of facilities offering this level of care within the geographic area requested by the patient (or if unable, by the patient's family).  Yes   Patient/family informed of their freedom to choose among providers that offer the needed level of care, that participate in Medicare, Medicaid or managed care program needed by the patient, have an available bed and are willing to accept the patient.  Yes   Patient/family informed of [place]'s ownership interest in [facility] and [facility], as well as of the fact that they are under no obligation to receive care at these facilities.  PASRR submitted to EDS on 04/03/15     PASRR number received on 04/04/15     Existing PASRR number confirmed on       FL2 transmitted to all facilities in geographic area requested by pt/family on 04/05/15     FL2 transmitted to all facilities within larger geographic area on       Patient informed that his/her managed care company has contracts with or will negotiate with certain facilities, including the following:        Yes   Patient/family informed of bed offers received.  Patient chooses bed at [facility]     Physician recommends and patient chooses bed at  [facility])    Patient to be transferred to   on  .  Patient to be transferred to facility by       Patient family notified on   of transfer.  Name of family member notified:        PHYSICIAN       Additional Comment:    _______________________________________________ Darden Dates,  LCSW 04/05/2015, 2:08 PM

## 2015-04-05 NOTE — Progress Notes (Signed)
Physical Therapy Treatment Patient Details Name: [name] MRN: [id number] DOB: Jul 05, 1940 Today's Date: 04/05/2015    History of Present Illness Patient is a 74 y/o male with recent diagnosis of pancreatic cancer. Patient admitted with fevers and difficulty ambulating for several days.     PT Comments    Pt/spouse have questions regarding why pt would go to rehab when he is still weak versus staying in the hospital. Explanation/education on primary focus in hospital and skilled nursing facility. Pt spouse verbalize understanding. Pt demonstrating continual improvement in all areas. Pt requiring less assist for bed mobility and transfers. Pt also demonstrated improved balance with much less assist and ability to ambulate 12 ft two times today with Min A versus Mod A to chair yesterday. Pt received up in chair comfortably. Continue PT to progress strength and all functional mobility with decreasing assist.   Follow Up Recommendations  SNF     Equipment Recommendations       Recommendations for Other Services       Precautions / Restrictions Restrictions Weight Bearing Restrictions: No    Mobility  Bed Mobility Overal bed mobility: Needs Assistance Bed Mobility: Supine to Sit     Supine to sit: Supervision     General bed mobility comments: no physical assist given, cues for sequence scooting to edge of bed. Increased time/effort and use of rails with cues  Transfers Overall transfer level: Needs assistance Equipment used: Rolling walker (2 wheeled) Transfers: Sit to/from Stand Sit to Stand: Min assist         General transfer comment: cues for safe hand placement and use back of legs off of bed to assist to stand. Once standing able to hold upright position with Min guard away from bed.   Ambulation/Gait Ambulation/Gait assistance: Min guard;Min assist Ambulation Distance (Feet): 12 Feet (performed 2x with seated rest in between) Assistive device: Rolling  walker (2 wheeled) Gait Pattern/deviations: Step-through pattern;Decreased step length - right;Decreased step length - left;Decreased stride length (partial step through; mildly unsteady ) Gait velocity: reduced Gait velocity interpretation: <1.8 ft/sec, indicative of risk for recurrent falls General Gait Details: Improved balance today with only occasional, mild retropulsion. Slow small steps ambulation with occaisional need for Min assist for balance particularly with turning in chair approach   Stairs            Wheelchair Mobility    Modified Rankin (Stroke Patients Only)       Balance Overall balance assessment: Needs assistance Sitting-balance support: Bilateral upper extremity supported Sitting balance-Leahy Scale: Good     Standing balance support: Bilateral upper extremity supported Standing balance-Leahy Scale: Fair                      Cognition Arousal/Alertness: Awake/alert Behavior During Therapy: WFL for tasks assessed/performed Overall Cognitive Status: Within Functional Limits for tasks assessed                      Exercises General Exercises - Lower Extremity Long Arc Quad: AROM;Both;20 reps;Seated Hip Flexion/Marching: AROM;Both;20 reps;Seated Toe Raises: AROM;Both;20 reps;Seated Heel Raises: AROM;Both;20 reps;Seated    General Comments        Pertinent Vitals/Pain Pain Assessment: 0-10 Pain Score: 2  Pain Location: abdomen Pain Descriptors / Indicators: Sore Pain Intervention(s): Premedicated before session    Home Living                      Prior Function  PT Goals (current goals can now be found in the care plan section) Progress towards PT goals: Progressing toward goals    Frequency  Min 2X/week    PT Plan Current plan remains appropriate    Co-evaluation             End of Session Equipment Utilized During Treatment: Gait belt Activity Tolerance: Patient tolerated treatment  well;No increased pain;Patient limited by fatigue Patient left: in chair;with call bell/phone within reach;with chair alarm set;with family/visitor present     Time: [id number] PT Time Calculation (min) (ACUTE ONLY): 24 min  Charges:  $Gait Training: 8-22 mins $Therapeutic Exercise: 8-22 mins                    G Codes:      Charlaine Dalton 04/05/2015, 10:55 AM

## 2015-04-06 ENCOUNTER — Ambulatory Visit: Payer: Commercial Managed Care - HMO

## 2015-04-06 LAB — GLUCOSE, CAPILLARY
GLUCOSE-CAPILLARY: 210 mg/dL — AB (ref 65–99)
Glucose-Capillary: 125 mg/dL — ABNORMAL HIGH (ref 65–99)
Glucose-Capillary: 196 mg/dL — ABNORMAL HIGH (ref 65–99)
Glucose-Capillary: 207 mg/dL — ABNORMAL HIGH (ref 65–99)
Glucose-Capillary: 256 mg/dL — ABNORMAL HIGH (ref 65–99)

## 2015-04-06 MED ORDER — PREDNISONE 10 MG (21) PO TBPK: 10.0000 mg | ORAL_TABLET | Freq: Every day | ORAL | Status: DC

## 2015-04-06 MED ORDER — INSULIN GLARGINE 100 UNIT/ML ~~LOC~~ SOLN: 40.0000 [IU] | Freq: Two times a day (BID) | SUBCUTANEOUS | Status: AC

## 2015-04-06 MED ORDER — HYDROCODONE-ACETAMINOPHEN 5-325 MG PO TABS: 2.0000 | ORAL_TABLET | Freq: Four times a day (QID) | ORAL | Status: AC | PRN

## 2015-04-06 MED ORDER — GERHARDT'S BUTT CREAM
TOPICAL_CREAM | Freq: Two times a day (BID) | CUTANEOUS | Status: DC
  Administered 2015-04-06: 13:00:00 via TOPICAL
  Filled 2015-04-06: qty 1

## 2015-04-06 MED ORDER — LACTULOSE 10 GM/15ML PO SOLN: 30.0000 g | Freq: Every day | ORAL | Status: DC

## 2015-04-06 MED ORDER — CEPHALEXIN 500 MG PO CAPS: 500.0000 mg | ORAL_CAPSULE | Freq: Three times a day (TID) | ORAL | Status: DC

## 2015-04-06 MED ORDER — ZINC OXIDE 40 % EX OINT: TOPICAL_OINTMENT | Freq: Two times a day (BID) | CUTANEOUS | Status: DC

## 2015-04-06 MED ORDER — METRONIDAZOLE 500 MG PO TABS: 500.0000 mg | ORAL_TABLET | Freq: Three times a day (TID) | ORAL | Status: DC

## 2015-04-06 MED ORDER — GERHARDT'S BUTT CREAM: 1.0000 "application " | TOPICAL_CREAM | Freq: Two times a day (BID) | CUTANEOUS | Status: DC

## 2015-04-06 MED ORDER — INSULIN ASPART 100 UNIT/ML ~~LOC~~ SOLN: 5.0000 [IU] | Freq: Three times a day (TID) | SUBCUTANEOUS | Status: AC

## 2015-04-06 MED ORDER — LACTULOSE 10 GM/15ML PO SOLN: 30.0000 g | Freq: Every day | ORAL | Status: AC | PRN

## 2015-04-06 NOTE — Progress Notes (Signed)
[name]   DOB:[birth year]   [id number]    Subjective: No fevers overnight. Patient continues to feel extremely weak; shaky. However as per wife he had been sitting in the chair; transferring to the chair with help.  He has been able to work with physical therapy yesterday. Also planned to be discharged to a skilled facility today.  Review of system: mild shortness of breath; no cough. Swelling in the legs. He is Objective:  Filed Vitals:   04/05/15 2121 04/06/15 0521  BP: 165/72 155/63  Pulse: 65 69  Temp: 97.5 F (36.4 C) 98 F (36.7 C)  Resp: 20 20     Intake/Output Summary (Last 24 hours) at 04/06/15 0956 Last data filed at 04/05/15 1735  Gross per 24 hour  Intake    480 ml  Output      0 ml  Net    480 ml    GENERAL:  Alert, no acute distress.  He appears nontoxic.Marland Kitchen Accompanied by wife. He is sitting in the bed eating breakfast. EYES: No icterus OROPHARYNX: no thrush or ulceration; poor dentition NECK: supple, no masses felt LUNGS:decreased breath sounds bilaterally at the bases. No wheeze or crackles HEART/CVS: regular rate & rhythm and no murmurs; 1+ bilateral lower extremity edema ABDOMEN: abdomen soft, non-tender and normal bowel sounds Musculoskeletal:no cyanosis of digits and no clubbing  PSYCH: alert & oriented x 3 with flat affect/appears depressed NEURO: no focal motor/sensory deficits; generalized weakness present.he has coarse tremors bilateral upper extremities.  SKIN: no rashes or significant lesions  Labs:  Lab Results  Component Value Date   WBC 6.5 04/04/2015   HGB 10.1* 04/04/2015   HCT 31.6* 04/04/2015   MCV 85.7 04/04/2015   PLT 186 04/04/2015   NEUTROABS 13.5* 03/29/2015    Lab Results  Component Value Date   NA 144 04/05/2015   K 3.3* 04/05/2015   CL 111 04/05/2015   CO2 26 04/05/2015    Studies:  No results found.  Assessment & Plan:   74 year old male patient with a history of stage II pancreatic cancer currently  status post cycle #1 chemotherapy and radiation admitted to the hospital for high-grade fevers.   # fever-unclear etiology/and- resolved.   # Pancreatic cancer-stage II B-No obvious progression of the cancer noted on the MRCP of the liver. We'll reevaluate next week in the clinic.   # Deconditioning- patient will likely need rehabilitation/ patient is being transferred to [facility] skilled facility today.   #  All questions were answered. The above plan of care was discussed with the patient and his wife.   Cammie Sickle, MD 04/06/2015  9:56 AM

## 2015-04-06 NOTE — Clinical Social Work Placement (Signed)
   CLINICAL SOCIAL WORK PLACEMENT  NOTE  Date:  04/06/2015  Patient Details  Name: [name] MRN: [id number] Date of Birth: Jan 24, 1941  Clinical Social Work is seeking post-discharge placement for this patient at the [place] level of care (*CSW will initial, date and re-position this form in  chart as items are completed):  Yes   Patient/family provided with [place] Work Department's list of facilities offering this level of care within the geographic area requested by the patient (or if unable, by the patient's family).  Yes   Patient/family informed of their freedom to choose among providers that offer the needed level of care, that participate in Medicare, Medicaid or managed care program needed by the patient, have an available bed and are willing to accept the patient.  Yes   Patient/family informed of [place]'s ownership interest in [facility] and [facility], as well as of the fact that they are under no obligation to receive care at these facilities.  PASRR submitted to EDS on 04/03/15     PASRR number received on 04/04/15     Existing PASRR number confirmed on       FL2 transmitted to all facilities in geographic area requested by pt/family on 04/05/15     FL2 transmitted to all facilities within larger geographic area on       Patient informed that his/her managed care company has contracts with or will negotiate with certain facilities, including the following:        Yes   Patient/family informed of bed offers received.  Patient chooses bed at [facility]     Physician recommends and patient chooses bed at  [facility])    Patient to be transferred to [facility] on 04/06/15.  Patient to be transferred to facility by [facility]     Patient family notified on 04/06/15 of transfer.  Name of family member notified:  Wife and patient     PHYSICIAN       Additional Comment:     _______________________________________________ Joana Reamer, LCSW 04/06/2015, 12:17 PM

## 2015-04-06 NOTE — Clinical Social Work Note (Signed)
LCSW received call from [facility]) an authorization was given # [id number].   LCSW called [facility] and spoke to admissions clerk Tangy and texted Gerty  [id number] CSW informed [facility] that patient is having lunch at [facility] and will be transported by [facility] .  [place] Social Worker [phone]

## 2015-04-06 NOTE — Clinical Social Work Note (Signed)
LCSW met with patient and wife. Patient is being transported to [facility] and will be in room 102A. Nurse/Admissions was advised that discharge documentation was sent over  And we provided the [facility] [employer] #[id number]  Patient will be transported by EMS [place].   [place] Nurse was advised to call report. No further needs  Marv Alfrey LCSW Clinical Social Worker [phone]-

## 2015-04-06 NOTE — Clinical Social Work Note (Signed)
LCSW called Amy at [facility] for authorization for [facility], awaiting a call back.  [place] Social Worker [phone]

## 2015-04-06 NOTE — Discharge Summary (Signed)
[name], is a 74 y.o. male  DOB 1940-07-15  MRN [id number].  Admission date:  03/29/2015  Admitting Physician  Vaughan Basta, MD  Discharge Date:  04/06/2015   Primary MD  Rusty Aus., MD  Recommendations for primary care physician for things to follow:   Follow up with Dr.Govinda Brahmnday in  about 2 weeks. Follow-up with Dr. Ola Spurr in one week.  Admission Diagnosis  Sepsis, due to unspecified organism [facility]) [A41.9]   Discharge Diagnosis  Sepsis, due to unspecified organism [facility]) [A41.9]    Active Problems:   Sepsis ([place])   Fever   Pressure ulcer      Past Medical History  Diagnosis Date  . Hyperlipidemia   . H/O cardiac catheterization 06/03/06,12/03/11  . BPH (benign prostatic hypertrophy)   . Anxiety   . Syncope     LAUGHING INDUCED  . Myocardial infarction ([place])     x 2  . CHF (congestive heart failure) ([place])   . CAD (coronary artery disease) 06/03/06    cypher 2.5 x 18mm DES for 80% mid RCA sees Dr. Ubaldo Glassing  . Asthma     as a child  . Sleep apnea     hx of  . Kidney stone     hx of  . GERD (gastroesophageal reflux disease)     on medication for  . Cancer ([place])     hx of skin ca in "corner of left eye"  . Arthritis   . Anemia     taking iron  . Cataract, bilateral   . Neuromuscular disorder ([place])     diabetic neuropathy  . Diabetes mellitus without complication ([place])   . Pancreatic adenocarcinoma [facility])     Past Surgical History  Procedure Laterality Date  . Spine surgery  9/05    CERVICAL LAMINECTOMY  . Cervical herniated    . Eye surgery      as a child  . Coronary artery bypass graft  12/12/2011    Procedure: CORONARY ARTERY BYPASS GRAFTING (CABG);  Surgeon: Gaye Pollack, MD;  Location: [facility];  Service: Open Heart Surgery;  Laterality: N/A;  coronary artery  bypass graft times four using left internal mammary artery, right leg greater saphenous vein harvested endoscopically  . Cardiac catheterization N/A 10/26/2014    Procedure: Left Heart Cath;  Surgeon: Isaias Cowman, MD;  Location: [place] [facility];  Service: Cardiovascular;  Laterality: N/A;  . Endoscopic retrograde cholangiopancreatography (ercp) with propofol N/A 02/13/2015    Procedure: ENDOSCOPIC RETROGRADE CHOLANGIOPANCREATOGRAPHY (ERCP) WITH PROPOFOL;  Surgeon: Lucilla Lame, MD;  Location: [facility] ENDOSCOPY;  Service: Endoscopy;  Laterality: N/A;  . Eus N/A 02/23/2015    Procedure: UPPER ENDOSCOPIC ULTRASOUND (EUS) LINEAR;  Surgeon: Holly Bodily, MD;  Location: [facility] ENDOSCOPY;  Service: Gastroenterology;  Laterality: N/A;       History of present illness and  Hospital Course:     Kindly see H&P for history of present illness and admission details, please review complete Labs, Consult reports and Test reports for all details in brief  HPI  from the history and physical done on the day of admission [name] is a 74 y.o. male with a known history of adenocarcinoma of pancreas, benign prostatic hypertrophy, coronary artery disease, hyperlipidemia, diabetes, asthma, recent admission one month ago with Klebsiella pneumoniae bacteremia and was treated for 7 days IV antibiotic in hospital and discharged with oral 10 days course of Keflex. He is recently started on chemotherapy last week  and he had first radiation yesterday from [place], , needed for generalized weakness, fever, sepsis.[place] Hospital Course  1. Abscess with evidence of elevated white count generalized weakness tachycardia. Admitted for that, thought to have biliary source of sepsis secondary to recent EUS, ERCP. Patient was given vancomycin, Zosyn. Chest x-ray did not show any pneumonia. Abdominal CAT scan showed no major changes but continued to have pneumobilia. Patient is seen by oncology, GI. Patient LFTs  were normal. Patient had MRCP done, patient MRCP showed that stent is working. So GI did not feel that he needs repeat ERCP. After MRCP patient had a lot of wheezing, chest x-ray was concerning for possible pneumonia. That time he was given vancomycin, albuterol, IV Solu-Medrol. Patient did not have further hypoxia, further fevers. Vancomycin was discontinued by ID. Dr. Ola Spurr recommended that patient can be discharged with Flagyl, Keflex until December 28. They still think that the patient fever and sepsis is secondary to biliary source given recent stent placement with pneumobilia. Patient remained afebrile since December 9. End white count normalized.  2. Pancreatic cancer stage IIB: Because of overall deconditioning patient is a poor candidate to tolerate any chemotherapy. Patient is to follow up with Dr. Donella Stade from radiation for palliative radiation therapy. #3 diabetes mellitus type 2: Uncontrolled blood glucose levels secondary to steroid use: Seen by diabetic nurse. We had to increase the Lantus dose, increase the mealtime coverage also. Please see discharge instructions. Next number for constipation. Continue stool softeners right now he is having the multiple bowel movements so we discontinue stool softeners. #5. Deconditioning ; seen by physical therapy recommended SNF. Family chose for healthcare. 6.diaper rash: Continue Desitin, use nystatin powder also. #7 generalized body pains due to cancer, multiple medical problems. continuePercocet     Discharge Condition: stable   Follow UP  Follow-up Information    Follow up with Cammie Sickle, MD. Go on 04/10/2015.   Specialties:  Internal Medicine, Oncology   Why:  at 9:15 a.m.   Contact information:   [place] [address] [phone]         Discharge Instructions  and  Discharge Medications        Medication List    TAKE these medications        acetaminophen 650 MG CR tablet  Commonly  known as:  TYLENOL  Take 1,300 mg by mouth every 8 (eight) hours as needed for pain.     aspirin EC 81 MG tablet  Take 81 mg by mouth daily.     cephALEXin 500 MG capsule  Commonly known as:  KEFLEX  Take 1 capsule (500 mg total) by mouth 3 (three) times daily.     gabapentin 300 MG capsule  Commonly known as:  NEURONTIN  Take 300 mg by mouth 2 (two) times daily.     HYDROcodone-acetaminophen 5-325 MG tablet  Commonly known as:  NORCO/VICODIN  Take 1 tablet by mouth every 6 (six) hours as needed. For pain.     insulin aspart 100 UNIT/ML injection  Commonly known as:  NOVOLOG  Inject 5 Units into the skin 3 (three) times daily with meals.     insulin glargine 100 UNIT/ML injection  Commonly known as:  LANTUS  Inject 0.4 mLs (40 Units total) into the skin 2 (two) times daily.     insulin regular 100 units/mL injection  Commonly known as:  NOVOLIN R,HUMULIN R  Inject 5-15 Units into the skin 3 (three) times daily  before meals. Pt uses as needed per sliding scale.     lactulose 10 GM/15ML solution  Commonly known as:  CHRONULAC  Take 45 mLs (30 g total) by mouth daily.     liver oil-zinc oxide 40 % ointment  Commonly known as:  DESITIN  Apply topically 2 (two) times daily.     lovastatin 20 MG tablet  Commonly known as:  MEVACOR  Take 20 mg by mouth at bedtime.     meloxicam 7.5 MG tablet  Commonly known as:  MOBIC  Take 1 tablet by mouth daily.     metroNIDAZOLE 500 MG tablet  Commonly known as:  FLAGYL  Take 1 tablet (500 mg total) by mouth 3 (three) times daily.     omeprazole 20 MG capsule  Commonly known as:  PRILOSEC  Take 20 mg by mouth 2 (two) times daily.     ondansetron 4 MG tablet  Commonly known as:  ZOFRAN  Take 4 mg by mouth every 8 (eight) hours as needed for nausea or vomiting.     polyethylene glycol powder powder  Commonly known as:  GLYCOLAX/MIRALAX  Take 17 g by mouth daily as needed. Constipation.     predniSONE 10 MG (21) Tbpk tablet   Commonly known as:  STERAPRED UNI-PAK 21 TAB  Take 1 tablet (10 mg total) by mouth daily. 4 tab daily for 2 days 3 tab daily for 2 days 2 tab daily for 2 days 1 tab daily for 3 days     torsemide 20 MG tablet  Commonly known as:  DEMADEX  Take 40 mg by mouth 2 (two) times daily.          Diet and Activity recommendation: See Discharge Instructions above   Consults obtained - oncology ID   Major procedures and Radiology Reports - PLEASE review detailed and final reports for all details, in brief -      Ct Abdomen Pelvis W Contrast  03/29/2015  CLINICAL DATA:  74 year old male diagnosed with pancreatic moderately differentiated adenocarcinoma last month via endoscopic ultrasound biopsy. status post chemotherapy, radiation, stent placement. Abdominal pain and distension with fever for 2 days. Subsequent encounter. EXAM: CT ABDOMEN AND PELVIS WITH CONTRAST TECHNIQUE: Multidetector CT imaging of the abdomen and pelvis was performed using the standard protocol following bolus administration of intravenous contrast. CONTRAST:  30mL OMNIPAQUE IOHEXOL 300 MG/ML  SOLN COMPARISON:  CT Abdomen and Pelvis 02/14/2015 and earlier. FINDINGS: Stable cardiomegaly. No pericardial effusion. Small layering left pleural effusion is stable. Atelectasis at the lung bases. Previous sternotomy. No acute or metastatic osseous lesion identified. Stable small fat containing left inguinal hernia. No pelvic free fluid. Negative urinary bladder. Retained stool throughout the distal colon. Redundant sigmoid with some gaseous distension. Retained stool throughout the left colon, and more proximal colon. Redundant right colon. Normal appendix. Negative terminal ileum. No dilated small bowel. Small sliding-type gastric hiatal hernia. Otherwise negative stomach. CBD stent in place. Mild gaseous distension of the duodenum which otherwise appears normal. Pneumobilia has not significantly changed. Pancreatic ductal dilatation  has not significantly changed. Hypodense lesions at the pancreatic head and neck appears stable (series 2, images 31 and 32). Stable peripancreatic lymph node measuring 11 mm short axis situated between the IVC and main portal vein. Other porta hepatis nodes are stable measuring up to 12 mm short axis. Liver enhancement and gallbladder are within normal limits (small volume of pneumobilia related gas in the gallbladder). Spleen and adrenal glands within normal limits. Portal  venous system is patent. Aortoiliac calcified atherosclerosis noted. Major arterial structures are patent. No other abdominal or pelvic lymphadenopathy bilateral initial phase renal enhancement is stable and within normal limits. On delayed images there is decreased left renal contrast excretion. The right kidney appears stable. No abdominal free fluid. IMPRESSION: 1. Largely stable CT appearance of the abdomen since October. CBD stent in place. Hypodense pancreatic head and neck lesions with surrounding 11-12 mm short axis lymph nodes compatible with pancreatic adenocarcinoma. No distant metastatic disease identified in the abdomen or pelvis. 2. Lack of left renal contrast excretion on delayed images today suggesting a degree of left renal insufficiency. Electronically Signed   By: Genevie Ann M.D.   On: 03/29/2015 12:27   US Venous Img Lower Bilateral  03/31/2015  CLINICAL DATA:  Swelling. EXAM: BILATERAL LOWER EXTREMITY VENOUS DOPPLER ULTRASOUND TECHNIQUE: Gray-scale sonography with graded compression, as well as color Doppler and duplex ultrasound were performed to evaluate the lower extremity deep venous systems from the level of the common femoral vein and including the common femoral, femoral, profunda femoral, popliteal and calf veins including the posterior tibial, peroneal and gastrocnemius veins when visible. The superficial great saphenous vein was also interrogated. Spectral Doppler was utilized to evaluate flow at rest and with  distal augmentation maneuvers in the common femoral, femoral and popliteal veins. COMPARISON:  None. FINDINGS: RIGHT LOWER EXTREMITY Common Femoral Vein: No evidence of thrombus. Normal compressibility, respiratory phasicity and response to augmentation. Saphenofemoral Junction: No evidence of thrombus. Normal compressibility and flow on color Doppler imaging. Profunda Femoral Vein: No evidence of thrombus. Normal compressibility and flow on color Doppler imaging. Femoral Vein: No evidence of thrombus. Normal compressibility, respiratory phasicity and response to augmentation. Popliteal Vein: No evidence of thrombus. Normal compressibility, respiratory phasicity and response to augmentation. Calf Veins: No evidence of thrombus. Normal compressibility and flow on color Doppler imaging. Superficial Great Saphenous Vein: No evidence of thrombus. Normal compressibility and flow on color Doppler imaging. Other Findings:  None. LEFT LOWER EXTREMITY Common Femoral Vein: No evidence of thrombus. Normal compressibility, respiratory phasicity and response to augmentation. Saphenofemoral Junction: No evidence of thrombus. Normal compressibility and flow on color Doppler imaging. Profunda Femoral Vein: No evidence of thrombus. Normal compressibility and flow on color Doppler imaging. Femoral Vein: No evidence of thrombus. Normal compressibility, respiratory phasicity and response to augmentation. Popliteal Vein: No evidence of thrombus. Normal compressibility, respiratory phasicity and response to augmentation. Calf Veins: No evidence of thrombus. Normal compressibility and flow on color Doppler imaging. Superficial Great Saphenous Vein: No evidence of thrombus. Normal compressibility and flow on color Doppler imaging. Other Findings:  None. IMPRESSION: No evidence of deep venous thrombosis. Electronically Signed   By: Marcello Moores  Register   On: 03/31/2015 14:20   Dg Chest Port 1 View  04/01/2015  CLINICAL DATA:  74 year old  with current history of pancreatic cancer, sepsis, and persistent fevers. EXAM: PORTABLE CHEST 1 VIEW COMPARISON:  03/29/2015 and earlier, including CT chest 02/25/2015. FINDINGS: Prior sternotomy for CABG. Cardiac silhouette moderately enlarged, unchanged. Pulmonary vascularity normal without evidence of pulmonary edema. Suboptimal inspiration with atelectasis in the lung bases, left greater than right, increased since the examination 3 days ago. No new pulmonary parenchymal abnormalities elsewhere. IMPRESSION: 1. Suboptimal inspiration accounts for bibasilar atelectasis, left greater than right, worse than the examination 3 days ago. 2. Stable cardiomegaly without pulmonary edema. 3. No new abnormalities elsewhere. Electronically Signed   By: Evangeline Dakin M.D.   On: 04/01/2015 17:04  Dg Chest Port 1 View  03/29/2015  CLINICAL DATA:  Fever.  Pancreatic cancer. EXAM: PORTABLE CHEST 1 VIEW COMPARISON:  Chest radiograph 02/25/2015.  CT chest 02/25/2015. FINDINGS: The heart is enlarged. 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 Thoracic atherosclerosis. Low lung volumes. No definite active infiltrates or failure. No osseous findings. IMPRESSION: Low lung volumes. No definite active infiltrates or failure. Cardiomegaly. Electronically Signed   By: Staci Righter M.D.   On: 03/29/2015 10:39   Mr [name] Cm/mrcp  04/01/2015  CLINICAL DATA:  Pancreatic adenocarcinoma. EXAM: MRI ABDOMEN WITHOUT CONTRAST  (INCLUDING MRCP) TECHNIQUE: Multiplanar multisequence MR imaging of the abdomen was performed. Heavily T2-weighted images of the biliary and pancreatic ducts were obtained, and three-dimensional MRCP images were rendered by post processing. COMPARISON:  03/29/2015 FINDINGS: Images significantly degraded by motion artifact from breathing. Lower chest:  Small left pleural effusion.  Cardiomegaly. Hepatobiliary: Mild intrahepatic biliary dilatation with the common bile duct measuring 1.6 cm. There could be some gas in the common bile  duct. The CBD seems severely narrowed in the vicinity of the pancreatic head, with apparent fluid in the thin central lumen of the stent on images 20-25 of series 10 extending through this region. The stent itself is much harder to see on MR than on prior CT but is thought to still be present. No obvious enhancing hepatic mass seen, subject to reduce sensitivity from motion degradation of images. I am uncertain whether the apparent linear filling defects in the right and left portal veins are due to thrombus or artifact. Numerous tiny gallstones are present in the gallbladder. Pancreas: Dilated dorsal pancreatic duct with dilated side ducts and truncation in the pancreatic head again observed. Aside from the truncation, I do not see obvious differential enhancement in the pancreatic head to further signify the presence of the mass, although there is some soft tissue fullness in this vicinity. Spleen: Unremarkable Adrenals/Urinary Tract: Right mid kidney cyst measures about 8 mm diameter. Adrenal glands unremarkable. Stomach/Bowel: Unremarkable Vascular/Lymphatic: Known abdominal aortic atherosclerosis. A porta hepatis node measures 1.3 cm in short axis on image 18 series 7. Other: No supplemental non-categorized findings. Musculoskeletal: Unremarkable IMPRESSION: 1. Questionable linear filling defects in the right and left portal veins intrahepatic portions. Although quite likely from motion artifact, the appearance makes it difficult to confidently exclude the possibility of early portal vein thrombosis. Consider vascular ultrasound of the liver for further characterization. 2. Narrowing of the biliary tree in the vicinity of the pancreatic head. In this area, only the thing in the lumen of the stent is visualized as continuous. There is mild intrahepatic biliary dilatation and moderate extrahepatic biliary dilatation. There is likely also some pneumobilia. 3. Numerous tiny gallstones in the gallbladder. 4. The  pancreatic head mass is mainly observable due to truncation of the dilated dorsal pancreatic duct. Differential enhancement in this vicinity is not well seen. 5. Mild porta hepatis adenopathy. 6. Small left pleural effusion with cardiomegaly. 7. Images are degraded by motion artifact. Electronically Signed   By: Van Clines M.D.   On: 04/01/2015 11:03    Micro Results   Recent Results (from the past 240 hour(s))  Urine culture     Status: None   Collection Time: 03/29/15  9:55 AM  Result Value Ref Range Status   Specimen Description URINE, RANDOM  Final   Special Requests NONE  Final   Culture 1,000 COLONIES/mL INSIGNIFICANT GROWTH   Final   Report Status 03/30/2015 FINAL  Final  Blood Culture (routine  x 2)     Status: None   Collection Time: 03/29/15 10:00 AM  Result Value Ref Range Status   Specimen Description BLOOD LEFT HAND  Final   Special Requests BOTTLES DRAWN AEROBIC AND ANAEROBIC  11 CC  Final   Culture NO GROWTH 5 DAYS  Final   Report Status 04/03/2015 FINAL  Final  Blood Culture (routine x 2)     Status: None   Collection Time: 03/29/15 10:08 AM  Result Value Ref Range Status   Specimen Description BLOOD LEFT ARM  Final   Special Requests   Final    BOTTLES DRAWN AEROBIC AND ANAEROBIC  1CC ANAERO 3CC AERO   Culture NO GROWTH 5 DAYS  Final   Report Status 04/03/2015 FINAL  Final       Today   Subjective:   Noemi Dehart today and soft complaints of generalized body pains. Otherwise okay tolerating the diet. No nausea no vomiting, no constipation. Chest pain, no shortness of breath.  Objective:   Blood pressure 155/63, pulse 69, temperature 98 F (36.7 C), temperature source Oral, resp. rate 20, height 5\' 8"  (1.727 m), weight 95.936 kg (211 lb 8 oz), SpO2 96 %.   Intake/Output Summary (Last 24 hours) at 04/06/15 1026 Last data filed at 04/06/15 0915  Gross per 24 hour  Intake    720 ml  Output      0 ml  Net    720 ml    Exam Awake Alert,  Oriented x 3, No new F.N deficits, Normal affect [place].AT,PERRAL Supple Neck,No JVD, No cervical lymphadenopathy appriciated.  Symmetrical Chest wall movement, Good air movement bilaterally, CTAB RRR,No Gallops,Rubs or new Murmurs, No Parasternal Heave +ve B.Sounds, Abd Soft, Non tender, No organomegaly appriciated, No rebound -guarding or rigidity. No Cyanosis, Clubbing or edema, No new Rash or bruise  Data Review   CBC w Diff: Lab Results  Component Value Date   WBC 6.5 04/04/2015   HGB 10.1* 04/04/2015   HCT 31.6* 04/04/2015   PLT 186 04/04/2015   LYMPHOPCT 7 03/29/2015   BANDSPCT 0 02/14/2015   MONOPCT 1 03/29/2015   EOSPCT 0 03/29/2015   BASOPCT 0 03/29/2015    CMP: Lab Results  Component Value Date   NA 144 04/05/2015   K 3.3* 04/05/2015   CL 111 04/05/2015   CO2 26 04/05/2015   BUN 46* 04/05/2015   CREATININE 1.43* 04/05/2015   PROT 6.4* 03/29/2015   ALBUMIN 2.8* 03/29/2015   BILITOT 1.2 03/29/2015   ALKPHOS 92 03/29/2015   AST 35 03/29/2015   ALT 23 03/29/2015  .   Total Time in preparing paper work, data evaluation and todays exam - 40 minutes  Alvah Gilder M.D on 04/06/2015 at 10:26 AM    Note: This dictation was prepared with Dragon dictation along with smaller phrase technology. Any transcriptional errors that result from this process are unintentional.

## 2015-04-06 NOTE — Progress Notes (Signed)
VSS. Pt is discharged to [facility].  Discharge packet with prescriptions in pt's slot. Report given to [place], Therapist, sports. Awaiting EMS to transport.

## 2015-04-06 NOTE — Plan of Care (Signed)
Problem: Safety: Goal: Ability to remain free from injury will improve Outcome: Progressing Patient on high fall risk.  Bed is in lowest position and call bell and phone are within reach.  Patient calls for needs.  Bed alarm on throughout shift.  Pt remains free from injury.  Problem: Health Behavior/Discharge Planning: Goal: Ability to manage health-related needs will improve Outcome: Progressing Pt continues on IV Flagyl. Plan to D/C to SNF today.  Problem: Skin Integrity: Goal: Risk for impaired skin integrity will decrease Outcome: Progressing Pt refuses to be turned by staff every two hours. States that he can turn himself when he's uncomfortable.  He is incontinent and has had to be changed several times this shift.  His scrotum, penis and buttocks are red.  Desitin applied to areas after each changing.

## 2015-04-07 ENCOUNTER — Ambulatory Visit: Payer: Commercial Managed Care - HMO

## 2015-04-07 LAB — GLUCOSE, CAPILLARY: GLUCOSE-CAPILLARY: 250 mg/dL — AB (ref 65–99)

## 2015-04-10 ENCOUNTER — Inpatient Hospital Stay (HOSPITAL_BASED_OUTPATIENT_CLINIC_OR_DEPARTMENT_OTHER): Payer: Commercial Managed Care - HMO | Admitting: Internal Medicine

## 2015-04-10 ENCOUNTER — Inpatient Hospital Stay: Payer: Commercial Managed Care - HMO

## 2015-04-10 ENCOUNTER — Ambulatory Visit
Admission: RE | Admit: 2015-04-10 | Discharge: 2015-04-10 | Disposition: A | Discharge status: Home / Self Care | Payer: Commercial Managed Care - HMO | Source: Ambulatory Visit | Attending: Radiation Oncology | Admitting: Radiation Oncology

## 2015-04-10 VITALS — BP 135/70 | HR 73 | Temp 97.8°F | Resp 18 | Ht 68.0 in | Wt 198.4 lb

## 2015-04-10 DIAGNOSIS — F419 Anxiety disorder, unspecified: Secondary | ICD-10-CM

## 2015-04-10 DIAGNOSIS — I517 Cardiomegaly: Secondary | ICD-10-CM | POA: Diagnosis not present

## 2015-04-10 DIAGNOSIS — K219 Gastro-esophageal reflux disease without esophagitis: Secondary | ICD-10-CM | POA: Diagnosis not present

## 2015-04-10 DIAGNOSIS — Z5111 Encounter for antineoplastic chemotherapy: Secondary | ICD-10-CM | POA: Diagnosis present

## 2015-04-10 DIAGNOSIS — E1140 Type 2 diabetes mellitus with diabetic neuropathy, unspecified: Secondary | ICD-10-CM | POA: Diagnosis not present

## 2015-04-10 DIAGNOSIS — E785 Hyperlipidemia, unspecified: Secondary | ICD-10-CM

## 2015-04-10 DIAGNOSIS — R42 Dizziness and giddiness: Secondary | ICD-10-CM | POA: Diagnosis not present

## 2015-04-10 DIAGNOSIS — Z7982 Long term (current) use of aspirin: Secondary | ICD-10-CM | POA: Diagnosis not present

## 2015-04-10 DIAGNOSIS — D649 Anemia, unspecified: Secondary | ICD-10-CM | POA: Diagnosis not present

## 2015-04-10 DIAGNOSIS — R978 Other abnormal tumor markers: Secondary | ICD-10-CM | POA: Diagnosis not present

## 2015-04-10 DIAGNOSIS — I509 Heart failure, unspecified: Secondary | ICD-10-CM

## 2015-04-10 DIAGNOSIS — G4730 Sleep apnea, unspecified: Secondary | ICD-10-CM

## 2015-04-10 DIAGNOSIS — Z87442 Personal history of urinary calculi: Secondary | ICD-10-CM | POA: Diagnosis not present

## 2015-04-10 DIAGNOSIS — N400 Enlarged prostate without lower urinary tract symptoms: Secondary | ICD-10-CM | POA: Diagnosis not present

## 2015-04-10 DIAGNOSIS — Z85828 Personal history of other malignant neoplasm of skin: Secondary | ICD-10-CM | POA: Diagnosis not present

## 2015-04-10 DIAGNOSIS — J9811 Atelectasis: Secondary | ICD-10-CM

## 2015-04-10 DIAGNOSIS — Z800 Family history of malignant neoplasm of digestive organs: Secondary | ICD-10-CM

## 2015-04-10 DIAGNOSIS — Z794 Long term (current) use of insulin: Secondary | ICD-10-CM

## 2015-04-10 DIAGNOSIS — K831 Obstruction of bile duct: Secondary | ICD-10-CM | POA: Diagnosis not present

## 2015-04-10 DIAGNOSIS — C250 Malignant neoplasm of head of pancreas: Secondary | ICD-10-CM

## 2015-04-10 DIAGNOSIS — I252 Old myocardial infarction: Secondary | ICD-10-CM

## 2015-04-10 DIAGNOSIS — K4090 Unilateral inguinal hernia, without obstruction or gangrene, not specified as recurrent: Secondary | ICD-10-CM | POA: Diagnosis not present

## 2015-04-10 DIAGNOSIS — K760 Fatty (change of) liver, not elsewhere classified: Secondary | ICD-10-CM

## 2015-04-10 DIAGNOSIS — Z87891 Personal history of nicotine dependence: Secondary | ICD-10-CM | POA: Diagnosis not present

## 2015-04-10 DIAGNOSIS — R531 Weakness: Secondary | ICD-10-CM | POA: Diagnosis not present

## 2015-04-10 DIAGNOSIS — C259 Malignant neoplasm of pancreas, unspecified: Secondary | ICD-10-CM | POA: Diagnosis not present

## 2015-04-10 DIAGNOSIS — M129 Arthropathy, unspecified: Secondary | ICD-10-CM

## 2015-04-10 DIAGNOSIS — Z79899 Other long term (current) drug therapy: Secondary | ICD-10-CM | POA: Diagnosis not present

## 2015-04-10 DIAGNOSIS — I2510 Atherosclerotic heart disease of native coronary artery without angina pectoris: Secondary | ICD-10-CM

## 2015-04-10 DIAGNOSIS — Z510 Encounter for antineoplastic radiation therapy: Secondary | ICD-10-CM | POA: Diagnosis not present

## 2015-04-10 LAB — COMPREHENSIVE METABOLIC PANEL
ALBUMIN: 2.8 g/dL — AB (ref 3.5–5.0)
ALT: 48 U/L (ref 17–63)
AST: 35 U/L (ref 15–41)
Alkaline Phosphatase: 115 U/L (ref 38–126)
Anion gap: 8 (ref 5–15)
BUN: 44 mg/dL — AB (ref 6–20)
CHLORIDE: 103 mmol/L (ref 101–111)
CO2: 25 mmol/L (ref 22–32)
CREATININE: 1.98 mg/dL — AB (ref 0.61–1.24)
Calcium: 7.9 mg/dL — ABNORMAL LOW (ref 8.9–10.3)
GFR calc Af Amer: 37 mL/min — ABNORMAL LOW (ref >60)
GFR, EST NON AFRICAN AMERICAN: 32 mL/min — AB (ref >60)
GLUCOSE: 308 mg/dL — AB (ref 65–99)
Potassium: 3.7 mmol/L (ref 3.5–5.1)
SODIUM: 136 mmol/L (ref 135–145)
Total Bilirubin: 0.5 mg/dL (ref 0.3–1.2)
Total Protein: 5.9 g/dL — ABNORMAL LOW (ref 6.5–8.1)

## 2015-04-10 LAB — CBC WITH DIFFERENTIAL/PLATELET
BASOS ABS: 0 10*3/uL (ref 0–0.1)
BASOS PCT: 0 %
EOS PCT: 1 %
Eosinophils Absolute: 0.1 10*3/uL (ref 0–0.7)
HCT: 32.6 % — ABNORMAL LOW (ref 40.0–52.0)
Hemoglobin: 10.6 g/dL — ABNORMAL LOW (ref 13.0–18.0)
LYMPHS PCT: 11 %
Lymphs Abs: 1.4 10*3/uL (ref 1.0–3.6)
MCH: 27.8 pg (ref 26.0–34.0)
MCHC: 32.6 g/dL (ref 32.0–36.0)
MCV: 85.2 fL (ref 80.0–100.0)
Monocytes Absolute: 0.9 10*3/uL (ref 0.2–1.0)
Monocytes Relative: 7 %
Neutro Abs: 10.5 10*3/uL — ABNORMAL HIGH (ref 1.4–6.5)
Neutrophils Relative %: 81 %
PLATELETS: 303 10*3/uL (ref 150–440)
RBC: 3.82 MIL/uL — AB (ref 4.40–5.90)
RDW: 15.7 % — ABNORMAL HIGH (ref 11.5–14.5)
WBC: 12.9 10*3/uL — AB (ref 3.8–10.6)

## 2015-04-10 MED ORDER — CEPHALEXIN 500 MG PO CAPS: 500.0000 mg | ORAL_CAPSULE | Freq: Three times a day (TID) | ORAL | Status: AC

## 2015-04-10 MED ORDER — METRONIDAZOLE 500 MG PO TABS: 500.0000 mg | ORAL_TABLET | Freq: Three times a day (TID) | ORAL | Status: AC

## 2015-04-10 NOTE — Progress Notes (Signed)
Patient currently resides at [facility] for rehab.

## 2015-04-10 NOTE — Progress Notes (Signed)
[facility] CONSULT NOTE  Patient Care Team: Rusty Aus, MD as PCP - General (Unknown Physician Specialty) Clent Jacks, RN as Registered Nurse [place], [place] as [place] [facility]  CHIEF COMPLAINTS/PURPOSE OF CONSULTATION:  # OCT 2016- PANCREATIC ADENO CA STAGE IIB [EUS-T3N0- abutting/early invasion of SMV]; CT- no distant mets. NOV 2016- ca-19-9- 986; II week Dec- start RT- gemcitabine [x1 DEC 5th]; HOLD further chemo; DEC 19th- START RT  # OBSTRUCTIVE JAUNDICE s/p ERCP [s/p stenting; Dr.Wohl; Dr.Rein- ca-19-9- [id number];Oct 2016]  HISTORY OF PRESENTING ILLNESS:  [name] 74 y.o.  male recent diagnosis of pancreas cancer T3 N0 based on EUS is here to review further treatment options. Radiation chemotherapy is on hold [he just received 1 dose of gemcitabine on December 5.]  In the interim patient was recently discharged from the hospital for sepsis;  Blood cultures were negative.  Status post antibiotics.  Currently in rehabilitation.  He states to getting better in rehabilitation. However still feels weak. No nausea no vomiting. No chills. No fevers. His extended fatigue. Complains of pain in his lower back.  ROS: He has mild swelling in the legs. A complete 10 point review of system is done which is negative except mentioned above in history of present illness.   MEDICAL HISTORY:  Past Medical History  Diagnosis Date  . Hyperlipidemia   . H/O cardiac catheterization 06/03/06,12/03/11  . BPH (benign prostatic hypertrophy)   . Anxiety   . Syncope     LAUGHING INDUCED  . Myocardial infarction ([place])     x 2  . CHF (congestive heart failure) ([place])   . CAD (coronary artery disease) 06/03/06    cypher 2.5 x 88mm DES for 80% mid RCA sees Dr. Ubaldo Glassing  . Asthma     as a child  . Sleep apnea     hx of  . Kidney stone     hx of  . GERD (gastroesophageal reflux disease)     on medication for  . Cancer ([place])     hx of skin ca in  "corner of left eye"  . Arthritis   . Anemia     taking iron  . Cataract, bilateral   . Neuromuscular disorder ([place])     diabetic neuropathy  . Diabetes mellitus without complication ([place])   . Pancreatic adenocarcinoma [facility])     SURGICAL HISTORY: Past Surgical History  Procedure Laterality Date  . Spine surgery  9/05    CERVICAL LAMINECTOMY  . Cervical herniated    . Eye surgery      as a child  . Coronary artery bypass graft  12/12/2011    Procedure: CORONARY ARTERY BYPASS GRAFTING (CABG);  Surgeon: Gaye Pollack, MD;  Location: [facility];  Service: Open Heart Surgery;  Laterality: N/A;  coronary artery bypass graft times four using left internal mammary artery, right leg greater saphenous vein harvested endoscopically  . Cardiac catheterization N/A 10/26/2014    Procedure: Left Heart Cath;  Surgeon: Isaias Cowman, MD;  Location: [facility];  Service: Cardiovascular;  Laterality: N/A;  . Endoscopic retrograde cholangiopancreatography (ercp) with propofol N/A 02/13/2015    Procedure: ENDOSCOPIC RETROGRADE CHOLANGIOPANCREATOGRAPHY (ERCP) WITH PROPOFOL;  Surgeon: Lucilla Lame, MD;  Location: [facility] ENDOSCOPY;  Service: Endoscopy;  Laterality: N/A;  . Eus N/A 02/23/2015    Procedure: UPPER ENDOSCOPIC ULTRASOUND (EUS) LINEAR;  Surgeon: Holly Bodily, MD;  Location: [facility] ENDOSCOPY;  Service: Gastroenterology;  Laterality: N/A;  SOCIAL HISTORY: Social History   Social History  . Marital Status: Married    Spouse Name: N/A  . Number of Children: N/A  . Years of Education: N/A   Occupational History  . Not on file.   Social History Main Topics  . Smoking status: Former Smoker    Types: Cigarettes, Cigars    Quit date: 12/03/1977  . Smokeless tobacco: Never Used  . Alcohol Use: No  . Drug Use: No  . Sexual Activity: Not on file   Other Topics Concern  . Not on file   Social History Narrative   Lives with his wife    FAMILY HISTORY: Family History   Problem Relation Age of Onset  . Diabetes Mellitus II Mother     Also Brother and sister  . Pancreatic cancer Mother   . Leukemia Father   . Diabetes Mellitus II Sister   . Diabetes Mellitus II Brother     ALLERGIES:  is allergic to zithromax and lisinopril.  MEDICATIONS:  Current Outpatient Prescriptions  Medication Sig Dispense Refill  . acetaminophen (TYLENOL) 650 MG CR tablet Take 1,300 mg by mouth every 8 (eight) hours as needed for pain.    Marland Kitchen aspirin EC 81 MG tablet Take 81 mg by mouth daily.    . cephALEXin (KEFLEX) 500 MG capsule Take 1 capsule (500 mg total) by mouth 3 (three) times daily. 42 capsule 0  . gabapentin (NEURONTIN) 300 MG capsule Take 300 mg by mouth 2 (two) times daily.    Marland Kitchen HYDROcodone-acetaminophen (NORCO/VICODIN) 5-325 MG tablet Take 2 tablets by mouth every 6 (six) hours as needed. For pain. 30 tablet 0  . Hydrocortisone (GERHARDT'S BUTT CREAM) CREA Apply 1 application topically 2 (two) times daily. 1 each 0  . insulin aspart (NOVOLOG) 100 UNIT/ML injection Inject 5 Units into the skin 3 (three) times daily with meals. 10 mL 1  . insulin glargine (LANTUS) 100 UNIT/ML injection Inject 0.4 mLs (40 Units total) into the skin 2 (two) times daily. 10 mL 1  . insulin regular (NOVOLIN R,HUMULIN R) 100 units/mL injection Inject 5-15 Units into the skin 3 (three) times daily before meals. Pt uses as needed per sliding scale.    . lactulose (CHRONULAC) 10 GM/15ML solution Take 45 mLs (30 g total) by mouth daily as needed for mild constipation. 240 mL 0  . liver oil-zinc oxide (DESITIN) 40 % ointment Apply topically 2 (two) times daily. 56.7 g 0  . lovastatin (MEVACOR) 20 MG tablet Take 20 mg by mouth at bedtime.    . meloxicam (MOBIC) 7.5 MG tablet Take 1 tablet by mouth daily.    . metroNIDAZOLE (FLAGYL) 500 MG tablet Take 1 tablet (500 mg total) by mouth 3 (three) times daily. 42 tablet 0  . omeprazole (PRILOSEC) 20 MG capsule Take 20 mg by mouth 2 (two) times daily.     . ondansetron (ZOFRAN) 4 MG tablet Take 4 mg by mouth every 8 (eight) hours as needed for nausea or vomiting.    . polyethylene glycol powder (GLYCOLAX/MIRALAX) powder Take 17 g by mouth daily as needed. Constipation.    . predniSONE (STERAPRED UNI-PAK 21 TAB) 10 MG (21) TBPK tablet Take 1 tablet (10 mg total) by mouth daily. 4 tab daily for 2 days 3 tab daily for 2 days 2 tab daily for 2 days 1 tab daily for 3 days 21 tablet 0  . torsemide (DEMADEX) 20 MG tablet Take 40 mg by mouth 2 (two) times daily.  No current facility-administered medications for this visit.      Marland Kitchen  PHYSICAL EXAMINATION: ECOG PERFORMANCE STATUS: 1 - Symptomatic but completely ambulatory  Filed Vitals:   04/10/15 0926  BP: 135/70  Pulse: 73  Temp: 97.8 F (36.6 C)  Resp: 18   Filed Weights   04/10/15 0926  Weight: 198 lb 6.6 oz (90 kg)    GENERAL: Well-nourished well-developed; Alert, no distress and comfortable.   Accompanied by wife. He is in a wheelchair. EYES: No pallor nor icterus OROPHARYNX: no thrush or ulceration; poor dentition NECK: supple, no masses felt LYMPH:  no palpable lymphadenopathy in the cervical, axillary or inguinal regions LUNGS: clear to auscultation and  No wheeze or crackles HEART/CVS: regular rate & rhythm and no murmurs; 1+ bilateral lower extremity edema ABDOMEN: abdomen soft, non-tender and normal bowel sounds Musculoskeletal:no cyanosis of digits and no clubbing  PSYCH: alert & oriented x 3 with fluent speech NEURO: no focal motor/sensory deficits SKIN:  no rashes or significant lesions  LABORATORY DATA:  I have reviewed the data as listed Lab Results  Component Value Date   WBC 12.9* 04/10/2015   HGB 10.6* 04/10/2015   HCT 32.6* 04/10/2015   MCV 85.2 04/10/2015   PLT 303 04/10/2015    Recent Labs  02/25/15 1204  02/28/15 0413 03/23/15 0837 03/29/15 0955  04/04/15 0547 04/05/15 0535 04/10/15 0853  NA 139  < > 140 137 139  < > 150* 144 136  K 3.7   < > 3.4* 4.0 3.8  < > 3.9 3.3* 3.7  CL 107  < > 103 105 109  < > 119* 111 103  CO2 25  < > 27 25 22   < > 25 26 25   GLUCOSE 122*  < > 152* 288* 206*  < > 273* 242* 308*  BUN 26*  < > 25* 22* 31*  < > 51* 46* 44*  CREATININE 2.02*  < > 1.58* 1.55* 1.69*  < > 1.78* 1.43* 1.98*  CALCIUM 8.0*  < > 8.8* 8.3* 8.3*  < > 8.8* 8.1* 7.9*  GFRNONAA 31*  < > 41* 42* 38*  < > 36* 47* 32*  GFRAA 36*  < > 48* 49* 44*  < > 42* 54* 37*  PROT 6.3*  < > 6.7 6.5 6.4*  --   --   --  5.9*  ALBUMIN 2.6*  < > 2.6* 3.0* 2.8*  --   --   --  2.8*  AST 96*  < > 32 31 35  --   --   --  35  ALT 55  < > 30 23 23   --   --   --  48  ALKPHOS 251*  < > 211* 133* 92  --   --   --  115  BILITOT 3.4*  < > 2.3* 1.1 1.2  --   --   --  0.5  BILIDIR 1.8*  --  1.1*  --   --   --   --   --   --   IBILI 1.6*  --  1.2*  --   --   --   --   --   --   < > = values in this interval not displayed.  RADIOGRAPHIC STUDIES: I have personally reviewed the radiological images as listed and agreed with the findings in the report. Ct Abdomen Pelvis W Contrast  03/29/2015  CLINICAL DATA:  74 year old male diagnosed with pancreatic moderately differentiated  adenocarcinoma last month via endoscopic ultrasound biopsy. status post chemotherapy, radiation, stent placement. Abdominal pain and distension with fever for 2 days. Subsequent encounter. EXAM: CT ABDOMEN AND PELVIS WITH CONTRAST TECHNIQUE: Multidetector CT imaging of the abdomen and pelvis was performed using the standard protocol following bolus administration of intravenous contrast. CONTRAST:  26mL OMNIPAQUE IOHEXOL 300 MG/ML  SOLN COMPARISON:  CT Abdomen and Pelvis 02/14/2015 and earlier. FINDINGS: Stable cardiomegaly. No pericardial effusion. Small layering left pleural effusion is stable. Atelectasis at the lung bases. Previous sternotomy. No acute or metastatic osseous lesion identified. Stable small fat containing left inguinal hernia. No pelvic free fluid. Negative urinary bladder.  Retained stool throughout the distal colon. Redundant sigmoid with some gaseous distension. Retained stool throughout the left colon, and more proximal colon. Redundant right colon. Normal appendix. Negative terminal ileum. No dilated small bowel. Small sliding-type gastric hiatal hernia. Otherwise negative stomach. CBD stent in place. Mild gaseous distension of the duodenum which otherwise appears normal. Pneumobilia has not significantly changed. Pancreatic ductal dilatation has not significantly changed. Hypodense lesions at the pancreatic head and neck appears stable (series 2, images 31 and 32). Stable peripancreatic lymph node measuring 11 mm short axis situated between the IVC and main portal vein. Other porta hepatis nodes are stable measuring up to 12 mm short axis. Liver enhancement and gallbladder are within normal limits (small volume of pneumobilia related gas in the gallbladder). Spleen and adrenal glands within normal limits. Portal venous system is patent. Aortoiliac calcified atherosclerosis noted. Major arterial structures are patent. No other abdominal or pelvic lymphadenopathy bilateral initial phase renal enhancement is stable and within normal limits. On delayed images there is decreased left renal contrast excretion. The right kidney appears stable. No abdominal free fluid. IMPRESSION: 1. Largely stable CT appearance of the abdomen since October. CBD stent in place. Hypodense pancreatic head and neck lesions with surrounding 11-12 mm short axis lymph nodes compatible with pancreatic adenocarcinoma. No distant metastatic disease identified in the abdomen or pelvis. 2. Lack of left renal contrast excretion on delayed images today suggesting a degree of left renal insufficiency. Electronically Signed   By: Genevie Ann M.D.   On: 03/29/2015 12:27   US Venous Img Lower Bilateral  03/31/2015  CLINICAL DATA:  Swelling. EXAM: BILATERAL LOWER EXTREMITY VENOUS DOPPLER ULTRASOUND TECHNIQUE: Gray-scale  sonography with graded compression, as well as color Doppler and duplex ultrasound were performed to evaluate the lower extremity deep venous systems from the level of the common femoral vein and including the common femoral, femoral, profunda femoral, popliteal and calf veins including the posterior tibial, peroneal and gastrocnemius veins when visible. The superficial great saphenous vein was also interrogated. Spectral Doppler was utilized to evaluate flow at rest and with distal augmentation maneuvers in the common femoral, femoral and popliteal veins. COMPARISON:  None. FINDINGS: RIGHT LOWER EXTREMITY Common Femoral Vein: No evidence of thrombus. Normal compressibility, respiratory phasicity and response to augmentation. Saphenofemoral Junction: No evidence of thrombus. Normal compressibility and flow on color Doppler imaging. Profunda Femoral Vein: No evidence of thrombus. Normal compressibility and flow on color Doppler imaging. Femoral Vein: No evidence of thrombus. Normal compressibility, respiratory phasicity and response to augmentation. Popliteal Vein: No evidence of thrombus. Normal compressibility, respiratory phasicity and response to augmentation. Calf Veins: No evidence of thrombus. Normal compressibility and flow on color Doppler imaging. Superficial Great Saphenous Vein: No evidence of thrombus. Normal compressibility and flow on color Doppler imaging. Other Findings:  None. LEFT LOWER EXTREMITY Common Femoral Vein:  No evidence of thrombus. Normal compressibility, respiratory phasicity and response to augmentation. Saphenofemoral Junction: No evidence of thrombus. Normal compressibility and flow on color Doppler imaging. Profunda Femoral Vein: No evidence of thrombus. Normal compressibility and flow on color Doppler imaging. Femoral Vein: No evidence of thrombus. Normal compressibility, respiratory phasicity and response to augmentation. Popliteal Vein: No evidence of thrombus. Normal  compressibility, respiratory phasicity and response to augmentation. Calf Veins: No evidence of thrombus. Normal compressibility and flow on color Doppler imaging. Superficial Great Saphenous Vein: No evidence of thrombus. Normal compressibility and flow on color Doppler imaging. Other Findings:  None. IMPRESSION: No evidence of deep venous thrombosis. Electronically Signed   By: Marcello Moores  Register   On: 03/31/2015 14:20   Dg Chest Port 1 View  04/01/2015  CLINICAL DATA:  74 year old with current history of pancreatic cancer, sepsis, and persistent fevers. EXAM: PORTABLE CHEST 1 VIEW COMPARISON:  03/29/2015 and earlier, including CT chest 02/25/2015. FINDINGS: Prior sternotomy for CABG. Cardiac silhouette moderately enlarged, unchanged. Pulmonary vascularity normal without evidence of pulmonary edema. Suboptimal inspiration with atelectasis in the lung bases, left greater than right, increased since the examination 3 days ago. No new pulmonary parenchymal abnormalities elsewhere. IMPRESSION: 1. Suboptimal inspiration accounts for bibasilar atelectasis, left greater than right, worse than the examination 3 days ago. 2. Stable cardiomegaly without pulmonary edema. 3. No new abnormalities elsewhere. Electronically Signed   By: Evangeline Dakin M.D.   On: 04/01/2015 17:04   Dg Chest Port 1 View  03/29/2015  CLINICAL DATA:  Fever.  Pancreatic cancer. EXAM: PORTABLE CHEST 1 VIEW COMPARISON:  Chest radiograph 02/25/2015.  CT chest 02/25/2015. FINDINGS: The heart is enlarged. 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 Thoracic atherosclerosis. Low lung volumes. No definite active infiltrates or failure. No osseous findings. IMPRESSION: Low lung volumes. No definite active infiltrates or failure. Cardiomegaly. Electronically Signed   By: Staci Righter M.D.   On: 03/29/2015 10:39   Mr [name] Cm/mrcp  04/01/2015  CLINICAL DATA:  Pancreatic adenocarcinoma. EXAM: MRI ABDOMEN WITHOUT CONTRAST  (INCLUDING MRCP) TECHNIQUE: Multiplanar  multisequence MR imaging of the abdomen was performed. Heavily T2-weighted images of the biliary and pancreatic ducts were obtained, and three-dimensional MRCP images were rendered by post processing. COMPARISON:  03/29/2015 FINDINGS: Images significantly degraded by motion artifact from breathing. Lower chest:  Small left pleural effusion.  Cardiomegaly. Hepatobiliary: Mild intrahepatic biliary dilatation with the common bile duct measuring 1.6 cm. There could be some gas in the common bile duct. The CBD seems severely narrowed in the vicinity of the pancreatic head, with apparent fluid in the thin central lumen of the stent on images 20-25 of series 10 extending through this region. The stent itself is much harder to see on MR than on prior CT but is thought to still be present. No obvious enhancing hepatic mass seen, subject to reduce sensitivity from motion degradation of images. I am uncertain whether the apparent linear filling defects in the right and left portal veins are due to thrombus or artifact. Numerous tiny gallstones are present in the gallbladder. Pancreas: Dilated dorsal pancreatic duct with dilated side ducts and truncation in the pancreatic head again observed. Aside from the truncation, I do not see obvious differential enhancement in the pancreatic head to further signify the presence of the mass, although there is some soft tissue fullness in this vicinity. Spleen: Unremarkable Adrenals/Urinary Tract: Right mid kidney cyst measures about 8 mm diameter. Adrenal glands unremarkable. Stomach/Bowel: Unremarkable Vascular/Lymphatic: Known abdominal aortic atherosclerosis. A porta hepatis  node measures 1.3 cm in short axis on image 18 series 7. Other: No supplemental non-categorized findings. Musculoskeletal: Unremarkable IMPRESSION: 1. Questionable linear filling defects in the right and left portal veins intrahepatic portions. Although quite likely from motion artifact, the appearance makes it  difficult to confidently exclude the possibility of early portal vein thrombosis. Consider vascular ultrasound of the liver for further characterization. 2. Narrowing of the biliary tree in the vicinity of the pancreatic head. In this area, only the thing in the lumen of the stent is visualized as continuous. There is mild intrahepatic biliary dilatation and moderate extrahepatic biliary dilatation. There is likely also some pneumobilia. 3. Numerous tiny gallstones in the gallbladder. 4. The pancreatic head mass is mainly observable due to truncation of the dilated dorsal pancreatic duct. Differential enhancement in this vicinity is not well seen. 5. Mild porta hepatis adenopathy. 6. Small left pleural effusion with cardiomegaly. 7. Images are degraded by motion artifact. Electronically Signed   By: Van Clines M.D.   On: 04/01/2015 11:03    ASSESSMENT & PLAN:   #  Pancreatic adenocarcinoma stage IIB- T3 N0; possible invasion of the mesenteric vein on EUS. No evidence of any distant metastatic disease-on the recent MRI of the liver. Interruptions in chemotherapy/radiation therapy because of infection issues.  # Given the declining performance status/ at least 2 episodes of recent infection/sepsis needing admission to the hospital- I agree with patient's reluctance chemotherapy. Plan to hold off further chemotherapy- as in general the goal of care. Palliative therapy alone. After a long discussion- patient agrees to restart radiation.   # Also discussed CODE STATUS the patient- he has not made any decisions yet.   # Repeated episodes of infection 2- on antibiotics. I discussed with Dr. Ola Spurr infectious disease doctor. Recommend extending the duration of antibiotics until middle of January [jan 11th]  # Patient will see me back in approximately in 2 weeks.   #  Check CBC CMP at next visit. I had spoken to Dr. Donella Stade; he is aware of the plan not to restart concurrent chemotherapy. Spoke to  radiation staff today- plan to start radiation again starting today.  # 40 minutes face-to-face with the patient discussing the above plan of care; more than 50% of time spent on prognosis/ natural history; counseling and coordination.     Cammie Sickle, MD 04/10/2015 10:24 AM

## 2015-04-11 ENCOUNTER — Ambulatory Visit
Admission: RE | Admit: 2015-04-11 | Discharge: 2015-04-11 | Disposition: A | Discharge status: Home / Self Care | Payer: Commercial Managed Care - HMO | Source: Ambulatory Visit | Attending: Radiation Oncology | Admitting: Radiation Oncology

## 2015-04-11 DIAGNOSIS — Z510 Encounter for antineoplastic radiation therapy: Secondary | ICD-10-CM | POA: Diagnosis not present

## 2015-04-12 ENCOUNTER — Ambulatory Visit
Admission: RE | Admit: 2015-04-12 | Discharge: 2015-04-12 | Disposition: A | Discharge status: Home / Self Care | Payer: Commercial Managed Care - HMO | Source: Ambulatory Visit | Attending: Radiation Oncology | Admitting: Radiation Oncology

## 2015-04-12 DIAGNOSIS — Z510 Encounter for antineoplastic radiation therapy: Secondary | ICD-10-CM | POA: Diagnosis not present

## 2015-04-13 ENCOUNTER — Ambulatory Visit
Admission: RE | Admit: 2015-04-13 | Discharge: 2015-04-13 | Disposition: A | Discharge status: Home / Self Care | Payer: Commercial Managed Care - HMO | Source: Ambulatory Visit | Attending: Radiation Oncology | Admitting: Radiation Oncology

## 2015-04-13 DIAGNOSIS — Z510 Encounter for antineoplastic radiation therapy: Secondary | ICD-10-CM | POA: Diagnosis not present

## 2015-04-14 ENCOUNTER — Emergency Department (HOSPITAL_COMMUNITY): Payer: Commercial Managed Care - HMO

## 2015-04-14 ENCOUNTER — Emergency Department (HOSPITAL_COMMUNITY)
Admission: EM | Admit: 2015-04-14 | Discharge: 2015-04-14 | Disposition: A | Discharge status: Home / Self Care | Payer: Commercial Managed Care - HMO | Attending: Emergency Medicine | Admitting: Emergency Medicine

## 2015-04-14 ENCOUNTER — Encounter (HOSPITAL_COMMUNITY): Payer: Self-pay

## 2015-04-14 ENCOUNTER — Ambulatory Visit: Payer: Commercial Managed Care - HMO

## 2015-04-14 DIAGNOSIS — Z791 Long term (current) use of non-steroidal anti-inflammatories (NSAID): Secondary | ICD-10-CM | POA: Insufficient documentation

## 2015-04-14 DIAGNOSIS — Y9289 Other specified places as the place of occurrence of the external cause: Secondary | ICD-10-CM | POA: Insufficient documentation

## 2015-04-14 DIAGNOSIS — S0081XA Abrasion of other part of head, initial encounter: Secondary | ICD-10-CM | POA: Diagnosis not present

## 2015-04-14 DIAGNOSIS — Z7982 Long term (current) use of aspirin: Secondary | ICD-10-CM | POA: Diagnosis not present

## 2015-04-14 DIAGNOSIS — Z862 Personal history of diseases of the blood and blood-forming organs and certain disorders involving the immune mechanism: Secondary | ICD-10-CM | POA: Diagnosis not present

## 2015-04-14 DIAGNOSIS — Z8669 Personal history of other diseases of the nervous system and sense organs: Secondary | ICD-10-CM | POA: Insufficient documentation

## 2015-04-14 DIAGNOSIS — Z794 Long term (current) use of insulin: Secondary | ICD-10-CM | POA: Insufficient documentation

## 2015-04-14 DIAGNOSIS — S29001A Unspecified injury of muscle and tendon of front wall of thorax, initial encounter: Secondary | ICD-10-CM | POA: Diagnosis present

## 2015-04-14 DIAGNOSIS — I2510 Atherosclerotic heart disease of native coronary artery without angina pectoris: Secondary | ICD-10-CM | POA: Diagnosis not present

## 2015-04-14 DIAGNOSIS — I509 Heart failure, unspecified: Secondary | ICD-10-CM | POA: Insufficient documentation

## 2015-04-14 DIAGNOSIS — Z951 Presence of aortocoronary bypass graft: Secondary | ICD-10-CM | POA: Diagnosis not present

## 2015-04-14 DIAGNOSIS — Z87891 Personal history of nicotine dependence: Secondary | ICD-10-CM | POA: Insufficient documentation

## 2015-04-14 DIAGNOSIS — F419 Anxiety disorder, unspecified: Secondary | ICD-10-CM | POA: Insufficient documentation

## 2015-04-14 DIAGNOSIS — Z87442 Personal history of urinary calculi: Secondary | ICD-10-CM | POA: Diagnosis not present

## 2015-04-14 DIAGNOSIS — I252 Old myocardial infarction: Secondary | ICD-10-CM | POA: Insufficient documentation

## 2015-04-14 DIAGNOSIS — K219 Gastro-esophageal reflux disease without esophagitis: Secondary | ICD-10-CM | POA: Insufficient documentation

## 2015-04-14 DIAGNOSIS — Z79899 Other long term (current) drug therapy: Secondary | ICD-10-CM | POA: Insufficient documentation

## 2015-04-14 DIAGNOSIS — S2231XA Fracture of one rib, right side, initial encounter for closed fracture: Secondary | ICD-10-CM | POA: Diagnosis not present

## 2015-04-14 DIAGNOSIS — J45909 Unspecified asthma, uncomplicated: Secondary | ICD-10-CM | POA: Insufficient documentation

## 2015-04-14 DIAGNOSIS — E785 Hyperlipidemia, unspecified: Secondary | ICD-10-CM | POA: Diagnosis not present

## 2015-04-14 DIAGNOSIS — Z8507 Personal history of malignant neoplasm of pancreas: Secondary | ICD-10-CM | POA: Insufficient documentation

## 2015-04-14 DIAGNOSIS — M1990 Unspecified osteoarthritis, unspecified site: Secondary | ICD-10-CM | POA: Diagnosis not present

## 2015-04-14 DIAGNOSIS — Y998 Other external cause status: Secondary | ICD-10-CM | POA: Diagnosis not present

## 2015-04-14 DIAGNOSIS — W01198A Fall on same level from slipping, tripping and stumbling with subsequent striking against other object, initial encounter: Secondary | ICD-10-CM | POA: Insufficient documentation

## 2015-04-14 DIAGNOSIS — Y9389 Activity, other specified: Secondary | ICD-10-CM | POA: Diagnosis not present

## 2015-04-14 DIAGNOSIS — Z792 Long term (current) use of antibiotics: Secondary | ICD-10-CM | POA: Diagnosis not present

## 2015-04-14 DIAGNOSIS — Z7952 Long term (current) use of systemic steroids: Secondary | ICD-10-CM | POA: Insufficient documentation

## 2015-04-14 DIAGNOSIS — W19XXXA Unspecified fall, initial encounter: Secondary | ICD-10-CM

## 2015-04-14 DIAGNOSIS — E1140 Type 2 diabetes mellitus with diabetic neuropathy, unspecified: Secondary | ICD-10-CM | POA: Insufficient documentation

## 2015-04-14 DIAGNOSIS — Z85828 Personal history of other malignant neoplasm of skin: Secondary | ICD-10-CM | POA: Diagnosis not present

## 2015-04-14 DIAGNOSIS — Z87438 Personal history of other diseases of male genital organs: Secondary | ICD-10-CM | POA: Diagnosis not present

## 2015-04-14 LAB — CBC WITH DIFFERENTIAL/PLATELET
Basophils Absolute: 0 10*3/uL (ref 0.0–0.1)
Basophils Relative: 0 %
Eosinophils Absolute: 0.2 10*3/uL (ref 0.0–0.7)
Eosinophils Relative: 1 %
HCT: 35.8 % — ABNORMAL LOW (ref 39.0–52.0)
Hemoglobin: 10.8 g/dL — ABNORMAL LOW (ref 13.0–17.0)
Lymphocytes Relative: 8 %
Lymphs Abs: 0.9 10*3/uL (ref 0.7–4.0)
MCH: 27.5 pg (ref 26.0–34.0)
MCHC: 30.2 g/dL (ref 30.0–36.0)
MCV: 91.1 fL (ref 78.0–100.0)
Monocytes Absolute: 0.9 10*3/uL (ref 0.1–1.0)
Monocytes Relative: 8 %
Neutro Abs: 9.3 10*3/uL — ABNORMAL HIGH (ref 1.7–7.7)
Neutrophils Relative %: 83 %
Platelets: 314 10*3/uL (ref 150–400)
RBC: 3.93 MIL/uL — ABNORMAL LOW (ref 4.22–5.81)
RDW: 16.2 % — ABNORMAL HIGH (ref 11.5–15.5)
WBC: 11.2 10*3/uL — ABNORMAL HIGH (ref 4.0–10.5)

## 2015-04-14 LAB — COMPREHENSIVE METABOLIC PANEL
ALT: 26 U/L (ref 17–63)
AST: 22 U/L (ref 15–41)
Albumin: 2.7 g/dL — ABNORMAL LOW (ref 3.5–5.0)
Alkaline Phosphatase: 127 U/L — ABNORMAL HIGH (ref 38–126)
Anion gap: 9 (ref 5–15)
BUN: 34 mg/dL — ABNORMAL HIGH (ref 6–20)
CO2: 29 mmol/L (ref 22–32)
Calcium: 8.5 mg/dL — ABNORMAL LOW (ref 8.9–10.3)
Chloride: 106 mmol/L (ref 101–111)
Creatinine, Ser: 1.65 mg/dL — ABNORMAL HIGH (ref 0.61–1.24)
GFR calc Af Amer: 46 mL/min — ABNORMAL LOW (ref >60)
GFR calc non Af Amer: 39 mL/min — ABNORMAL LOW (ref >60)
Glucose, Bld: 129 mg/dL — ABNORMAL HIGH (ref 65–99)
Potassium: 4.1 mmol/L (ref 3.5–5.1)
Sodium: 144 mmol/L (ref 135–145)
Total Bilirubin: 0.7 mg/dL (ref 0.3–1.2)
Total Protein: 6.1 g/dL — ABNORMAL LOW (ref 6.5–8.1)

## 2015-04-14 LAB — URINALYSIS, ROUTINE W REFLEX MICROSCOPIC
Bilirubin Urine: NEGATIVE
Glucose, UA: NEGATIVE mg/dL
Ketones, ur: NEGATIVE mg/dL
Nitrite: NEGATIVE
Protein, ur: NEGATIVE mg/dL
Specific Gravity, Urine: 1.011 (ref 1.005–1.030)
pH: 5.5 (ref 5.0–8.0)

## 2015-04-14 LAB — URINE MICROSCOPIC-ADD ON

## 2015-04-14 NOTE — ED Notes (Addendum)
Per EMS, pt from rehab on [address]([facility]), pt got up and went to bathroom with his walker and tripped and fell over his walker, hit his head with small hematoma on right forehead, pt also complains of right rib pain. Pt denies neck/back pain. Pt denies dizziness but doesn't fully remember falling, just remembers going to the bathroom and ending up in the floor. Unknown if syncopal episode. NAD. 12 lead unremarkable. Has hx of chf and cancer.

## 2015-04-14 NOTE — ED Provider Notes (Signed)
CSN: [id number]     Arrival date & time 04/14/15  0543 History   First MD Initiated Contact with Patient 04/14/15 [phone]     Chief Complaint  Patient presents with  . Fall    HPI   74 year old male presents today status post fall. Patient was recently placed in a rehabilitation facility after being diagnosed with sepsis. He is wheelchair bound due to weakness but continues to attempt ambulation on his own which has resulted in a fall. Patient reports trying to the bathroom this morning falling forward hitting his head and he was able to get back into his chair on his own without assistance. At the time of evaluation patient cannot recall the fall specifically, but remembers the events surrounding the fall. He reports right rib pain, reports he is able to take a deep breath without significant pain. Patient denies any other complaints at this time my evaluation. Patient denies headache, neck pain, shortness of breath, cough, abdominal pain, hip pain, extremity pain. Patient hasn't had any recent fevers, chills, nausea, vomiting, dizziness, or any other systemic signs. At the time of evaluation both his daughter and son were present reporting that patient is very stubborn and unwilling to stay in the wheelchair as directed by rehabilitation facility. They report he is acting normal, and has not had any recent problems.   Past Medical History  Diagnosis Date  . Hyperlipidemia   . H/O cardiac catheterization 06/03/06,12/03/11  . BPH (benign prostatic hypertrophy)   . Anxiety   . Syncope     LAUGHING INDUCED  . Myocardial infarction ([place])     x 2  . CHF (congestive heart failure) ([place])   . CAD (coronary artery disease) 06/03/06    cypher 2.5 x 62mm DES for 80% mid RCA sees Dr. Ubaldo Glassing  . Asthma     as a child  . Sleep apnea     hx of  . Kidney stone     hx of  . GERD (gastroesophageal reflux disease)     on medication for  . Cancer ([place])     hx of skin ca in "corner of left eye"  . Arthritis    . Anemia     taking iron  . Cataract, bilateral   . Neuromuscular disorder ([place])     diabetic neuropathy  . Diabetes mellitus without complication ([place])   . Pancreatic adenocarcinoma [facility])    Past Surgical History  Procedure Laterality Date  . Spine surgery  9/05    CERVICAL LAMINECTOMY  . Cervical herniated    . Eye surgery      as a child  . Coronary artery bypass graft  12/12/2011    Procedure: CORONARY ARTERY BYPASS GRAFTING (CABG);  Surgeon: Gaye Pollack, MD;  Location: [place];  Service: Open Heart Surgery;  Laterality: N/A;  coronary artery bypass graft times four using left internal mammary artery, right leg greater saphenous vein harvested endoscopically  . Cardiac catheterization N/A 10/26/2014    Procedure: Left Heart Cath;  Surgeon: Isaias Cowman, MD;  Location: [place] [facility];  Service: Cardiovascular;  Laterality: N/A;  . Endoscopic retrograde cholangiopancreatography (ercp) with propofol N/A 02/13/2015    Procedure: ENDOSCOPIC RETROGRADE CHOLANGIOPANCREATOGRAPHY (ERCP) WITH PROPOFOL;  Surgeon: Lucilla Lame, MD;  Location: [facility] ENDOSCOPY;  Service: Endoscopy;  Laterality: N/A;  . Eus N/A 02/23/2015    Procedure: UPPER ENDOSCOPIC ULTRASOUND (EUS) LINEAR;  Surgeon: Holly Bodily, MD;  Location: [facility] ENDOSCOPY;  Service: Gastroenterology;  Laterality: N/A;  Family History  Problem Relation Age of Onset  . Diabetes Mellitus II Mother     Also Brother and sister  . Pancreatic cancer Mother   . Leukemia Father   . Diabetes Mellitus II Sister   . Diabetes Mellitus II Brother    Social History  Substance Use Topics  . Smoking status: Former Smoker    Types: Cigarettes, Cigars    Quit date: 12/03/1977  . Smokeless tobacco: Never Used  . Alcohol Use: No    Review of Systems  All other systems reviewed and are negative.   Allergies  Zithromax and Lisinopril  Home Medications   Prior to Admission medications   Medication Sig Start Date End Date  Taking? Authorizing Provider  acetaminophen (TYLENOL) 650 MG CR tablet Take 650 mg by mouth every 6 (six) hours as needed for pain.    Yes Historical Provider, MD  aspirin EC 81 MG tablet Take 81 mg by mouth daily.   Yes Historical Provider, MD  cephALEXin (KEFLEX) 500 MG capsule Take 1 capsule (500 mg total) by mouth 3 (three) times daily. 04/10/15  Yes Cammie Sickle, MD  gabapentin (NEURONTIN) 300 MG capsule Take 300 mg by mouth 2 (two) times daily.   Yes Historical Provider, MD  HYDROcodone-acetaminophen (NORCO/VICODIN) 5-325 MG tablet Take 2 tablets by mouth every 6 (six) hours as needed. For pain. 04/06/15  Yes Epifanio Lesches, MD  insulin aspart (NOVOLOG) 100 UNIT/ML injection Inject 5 Units into the skin 3 (three) times daily with meals. Patient taking differently: Inject 2-10 Units into the skin 3 (three) times daily with meals. Take 7 units with each meal along with sliding scale accordingly  151-200=2 units 201-250=4 units 251-300=6 units 301-351=8 units 400+=10 units and call MD 04/06/15  Yes Epifanio Lesches, MD  insulin glargine (LANTUS) 100 UNIT/ML injection Inject 0.4 mLs (40 Units total) into the skin 2 (two) times daily. Patient taking differently: Inject 54 Units into the skin 2 (two) times daily.  04/06/15  Yes Epifanio Lesches, MD  lactulose (CHRONULAC) 10 GM/15ML solution Take 45 mLs (30 g total) by mouth daily as needed for mild constipation. Patient taking differently: Take 30 g by mouth daily.  04/06/15  Yes Epifanio Lesches, MD  lovastatin (MEVACOR) 20 MG tablet Take 20 mg by mouth at bedtime.   Yes Historical Provider, MD  meloxicam (MOBIC) 7.5 MG tablet Take 1 tablet by mouth daily. 03/01/15  Yes Historical Provider, MD  metroNIDAZOLE (FLAGYL) 500 MG tablet Take 1 tablet (500 mg total) by mouth 3 (three) times daily. 04/10/15  Yes Cammie Sickle, MD  morphine (MS CONTIN) 15 MG 12 hr tablet Take 15 mg by mouth every 12 (twelve) hours.   Yes  Historical Provider, MD  omeprazole (PRILOSEC) 20 MG capsule Take 20 mg by mouth 2 (two) times daily.   Yes Historical Provider, MD  ondansetron (ZOFRAN) 4 MG tablet Take 4 mg by mouth every 8 (eight) hours as needed for nausea or vomiting. Reported on 04/10/2015   Yes Historical Provider, MD  oxycodone (OXY-IR) 5 MG capsule Take 5 mg by mouth every 6 (six) hours as needed for pain.   Yes Historical Provider, MD  polyethylene glycol powder (GLYCOLAX/MIRALAX) powder Take 17 g by mouth daily as needed for mild constipation. Reported on 04/10/2015 01/10/15  Yes Historical Provider, MD  predniSONE (DELTASONE) 10 MG tablet Take 10 mg by mouth daily with breakfast.   Yes Historical Provider, MD  torsemide (DEMADEX) 20 MG tablet Take 20  mg by mouth 2 (two) times daily.    Yes Historical Provider, MD   BP 155/95 mmHg  Pulse 91  Temp(Src) 98.3 F (36.8 C) (Oral)  Resp 17  SpO2 97%   Physical Exam  Constitutional: He is oriented to person, place, and time. He appears well-developed and well-nourished.  HENT:  Head: Normocephalic and atraumatic.  Abrasion to forehead  Eyes: Conjunctivae are normal. Pupils are equal, round, and reactive to light. Right eye exhibits no discharge. Left eye exhibits no discharge. No scleral icterus.  Neck: Normal range of motion. Neck supple. No JVD present. No tracheal deviation present.  Cardiovascular: Regular rhythm, normal heart sounds and intact distal pulses.   Pulmonary/Chest: Effort normal and breath sounds normal. No stridor. No respiratory distress. He has no wheezes. He has no rales. He exhibits no tenderness.  Tenderness to palpation of right lateral ribs diffusely, normal lung expansion with normal respiratory sounds  Musculoskeletal:  Patient has no CT or L-spine tenderness to palpation. Hips are stable, nontender to palpation. Distal sensation strength and motor function intact  Neurological: He is alert and oriented to person, place, and time.  Coordination normal.  Skin: Skin is warm and dry. No rash noted. No erythema. No pallor.  Psychiatric: He has a normal mood and affect. His behavior is normal. Judgment and thought content normal.  Nursing note and vitals reviewed.   ED Course  Procedures (including critical care time) Labs Review Labs Reviewed  CBC WITH DIFFERENTIAL/PLATELET - Abnormal; Notable for the following:    WBC 11.2 (*)    RBC 3.93 (*)    Hemoglobin 10.8 (*)    HCT 35.8 (*)    RDW 16.2 (*)    Neutro Abs 9.3 (*)    All other components within normal limits  COMPREHENSIVE METABOLIC PANEL - Abnormal; Notable for the following:    Glucose, Bld 129 (*)    BUN 34 (*)    Creatinine, Ser 1.65 (*)    Calcium 8.5 (*)    Total Protein 6.1 (*)    Albumin 2.7 (*)    Alkaline Phosphatase 127 (*)    GFR calc non Af Amer 39 (*)    GFR calc Af Amer 46 (*)    All other components within normal limits  URINALYSIS, ROUTINE W REFLEX MICROSCOPIC (NOT AT [facility]) - Abnormal; Notable for the following:    APPearance CLOUDY (*)    Hgb urine dipstick TRACE (*)    Leukocytes, UA SMALL (*)    All other components within normal limits  URINE MICROSCOPIC-ADD ON - Abnormal; Notable for the following:    Squamous Epithelial / LPF 0-5 (*)    Bacteria, UA FEW (*)    All other components within normal limits    Imaging Review Dg Ribs Unilateral W/chest Right  04/14/2015  CLINICAL DATA:  Fall with right rib pain.  Initial encounter. EXAM: RIGHT RIBS AND CHEST - 3+ VIEW COMPARISON:  04/01/2015 FINDINGS: Acute fractures of the right fourth, fifth, sixth, and seventh ribs with up to 100% displacement of the sixth rib. Fifth rib fracture is segmental. No pneumothorax or hemothorax. Right cervical rib. Chronic cardiomegaly.  The patient is status post CABG. IMPRESSION: 1. Right fourth through seventh rib fractures with displacement and single segmental injury. 2. No pneumothorax or hemothorax. Electronically Signed   By: Monte Fantasia  M.D.   On: 04/14/2015 06:53   Ct Head Wo Contrast  04/14/2015  CLINICAL DATA:  Fall over walker.  Hematoma.  Initial encounter. EXAM: CT HEAD WITHOUT CONTRAST TECHNIQUE: Contiguous axial images were obtained from the base of the skull through the vertex without intravenous contrast. COMPARISON:  02/10/2015 FINDINGS: Skull and Sinuses:Mild right forehead swelling.  No fracture. Visualized orbits: Negative. Brain: No evidence of acute infarction, hemorrhage, hydrocephalus, or mass lesion/mass effect. Generalized cerebral volume loss which is age appropriate. Normal cerebral white matter. IMPRESSION: 1. Negative for intracranial injury. 2. Mild forehead swelling without fracture. Electronically Signed   By: Monte Fantasia M.D.   On: 04/14/2015 07:00   I have personally reviewed and evaluated these images and lab results as part of my medical decision-making.   EKG Interpretation   Date/Time:  Friday April 14 2015 05:49:49 EST Ventricular Rate:  88 PR Interval:  167 QRS Duration: 91 QT Interval:  391 QTC Calculation: 473 R Axis:   -37 Text Interpretation:  Sinus rhythm STD and TWI in laeral leads LVH with  secondary repolarization abnormality No significant change since last  tracing Confirmed by Glynn Octave [phone]) on 04/14/2015 5:56:11  AM      MDM   Final diagnoses:  Fall, initial encounter  Rib fracture, right, closed, initial encounter    Labs: Urinalysis, CBC and CMP  Imaging: CT head without, DG ribs unilateral with chest right  Consults:  Therapeutics:  Discharge Meds:   Assessment/Plan: This is 74 year old male who presents status post fall. His children are present at the time of evaluation reporting patient is noncompliant with will chair bound status likely resulting in the fall. Patient is coherent, afebrile with reassuring vital and laboratory data. Today's fall most likely mechanical due to lower extremity weakness that has become patient's baseline.  He has no signs of infection on today's exam, he does however have right fourth through seventh rib fractures with displacement and single segmental injury, no signs of pneumo or hemothorax. Patient oxygenated at 98 here, he is able to take deep inspirations without significant pain. Patient resides rehabilitation facility where he is receiving and pain management via morphine scheduled twice daily. Patient appears in no acute distress, he will be discharged home to the rehabilitation facility with incentive spirometry, continue pain management, and to follow up with his primary care provider in 3 days for reevaluation. The patient and his children are given strict return precautions, encouraged to maintain close contact with patient and monitor for any new or worsening symptoms. They're cautioned on risk of pneumonia and signs and symptoms of this and instructed to be negative patient back to the emergency room immediately if any new or worsening signs or symptoms present. With the patient and his children verbalized understanding and agreement for today's plan and had no further questions or concerns at time of discharge.          Garrett Regal, PA-C 04/14/15 1626  Leo Grosser, MD 04/15/15 [phone]

## 2015-04-14 NOTE — ED Notes (Signed)
PA Hedges at bedside  

## 2015-04-14 NOTE — Discharge Instructions (Signed)
Rib Fracture A rib fracture is a break or crack in one of the bones of the ribs. The ribs are a group of long, curved bones that wrap around your chest and attach to your spine. They protect your lungs and other organs in the chest cavity. A broken or cracked rib is often painful, but most do not cause other problems. Most rib fractures heal on their own over time. However, rib fractures can be more serious if multiple ribs are broken or if broken ribs move out of place and push against other structures. CAUSES   A direct blow to the chest. For example, this could happen during contact sports, a car accident, or a fall against a hard object.  Repetitive movements with high force, such as pitching a baseball or having severe coughing spells. SYMPTOMS   Pain when you breathe in or cough.  Pain when someone presses on the injured area. DIAGNOSIS  Your caregiver will perform a physical exam. Various imaging tests may be ordered to confirm the diagnosis and to look for related injuries. These tests may include a chest X-ray, computed tomography (CT), magnetic resonance imaging (MRI), or a bone scan. TREATMENT  Rib fractures usually heal on their own in 1-3 months. The longer healing period is often associated with a continued cough or other aggravating activities. During the healing period, pain control is very important. Medication is usually given to control pain. Hospitalization or surgery may be needed for more severe injuries, such as those in which multiple ribs are broken or the ribs have moved out of place.  HOME CARE INSTRUCTIONS   Avoid strenuous activity and any activities or movements that cause pain. Be careful during activities and avoid bumping the injured rib.  Gradually increase activity as directed by your caregiver.  Only take over-the-counter or prescription medications as directed by your caregiver. Do not take other medications without asking your caregiver first.  Apply ice  to the injured area for the first 1-2 days after you have been treated or as directed by your caregiver. Applying ice helps to reduce inflammation and pain.  Put ice in a plastic bag.  Place a towel between your skin and the bag.   Leave the ice on for 15-20 minutes at a time, every 2 hours while you are awake.  Perform deep breathing as directed by your caregiver. This will help prevent pneumonia, which is a common complication of a broken rib. Your caregiver may instruct you to:  Take deep breaths several times a day.  Try to cough several times a day, holding a pillow against the injured area.  Use a device called an incentive spirometer to practice deep breathing several times a day.  Drink enough fluids to keep your urine clear or pale yellow. This will help you avoid constipation.   Do not wear a rib belt or binder. These restrict breathing, which can lead to pneumonia.  SEEK IMMEDIATE MEDICAL CARE IF:   You have a fever.   You have difficulty breathing or shortness of breath.   You develop a continual cough, or you cough up thick or bloody sputum.  You feel sick to your stomach (nausea), throw up (vomit), or have abdominal pain.   You have worsening pain not controlled with medications.  MAKE SURE YOU:  Understand these instructions.  Will watch your condition.  Will get help right away if you are not doing well or get worse.   This information is not intended to  replace advice given to you by your health care provider. Make sure you discuss any questions you have with your health care provider.   Document Released: 04/08/2005 Document Revised: 12/09/2012 Document Reviewed: 06/10/2012 Elsevier Interactive Patient Education [employer].  Please contact her primary care provider inform them of these isn't relevant data. Please request follow-up evaluation in 3 days. Please read attached information if any new or worsening signs or symptoms present please  return immediately to the emergency room for further evaluation and management. Please continue using your morphine previously prescribed as needed for pain.

## 2015-04-14 NOTE — ED Notes (Signed)
Breakfast tray placed at bedside.  

## 2015-04-14 NOTE — ED Notes (Signed)
Pt provided with incentive spirometer, pt and familly instructed on use.

## 2015-04-14 NOTE — ED Notes (Signed)
ptar at bedside. 

## 2015-04-18 ENCOUNTER — Other Ambulatory Visit: Payer: Self-pay | Admitting: *Deleted

## 2015-04-18 ENCOUNTER — Ambulatory Visit
Admission: RE | Admit: 2015-04-18 | Discharge: 2015-04-18 | Disposition: A | Discharge status: Home / Self Care | Payer: Commercial Managed Care - HMO | Source: Ambulatory Visit | Attending: Radiation Oncology | Admitting: Radiation Oncology

## 2015-04-18 ENCOUNTER — Encounter: Payer: Self-pay | Admitting: *Deleted

## 2015-04-18 DIAGNOSIS — Z510 Encounter for antineoplastic radiation therapy: Secondary | ICD-10-CM | POA: Diagnosis not present

## 2015-04-18 MED ORDER — FENTANYL 25 MCG/HR TD PT72: 25.0000 ug | MEDICATED_PATCH | TRANSDERMAL | Status: AC

## 2015-04-18 MED ORDER — FENTANYL 25 MCG/HR TD PT72: 25.0000 ug | MEDICATED_PATCH | TRANSDERMAL | Status: DC

## 2015-04-18 NOTE — Patient Outreach (Signed)
[facility]) Care Management  04/18/2015  Maijor Gines [place] 04-21-1941 [id number]  This patient was referred to this social worker for follow up while at the skilled nursing facility.  This social worker went to visit patient at [facility] today, however he was not there.  This Education officer, museum spoke with his nurse who stated that patient was at an appointment at the [facility].  This social worker spoke with the discharge planner at the [facility] who stated that patient's last covered day is 05/03/15 with a potential discharge for 05/04/15.  Plan:  This Education officer, museum will follow up with patient at [facility] within 1 week .    Sheralyn Boatman [facility] Care Management [phone]

## 2015-04-19 ENCOUNTER — Ambulatory Visit
Admission: RE | Admit: 2015-04-19 | Discharge: 2015-04-19 | Disposition: A | Discharge status: Home / Self Care | Payer: Commercial Managed Care - HMO | Source: Ambulatory Visit | Attending: Radiation Oncology | Admitting: Radiation Oncology

## 2015-04-19 DIAGNOSIS — Z510 Encounter for antineoplastic radiation therapy: Secondary | ICD-10-CM | POA: Diagnosis not present

## 2015-04-20 ENCOUNTER — Ambulatory Visit
Admission: RE | Admit: 2015-04-20 | Discharge: 2015-04-20 | Disposition: A | Discharge status: Home / Self Care | Payer: Commercial Managed Care - HMO | Source: Ambulatory Visit | Attending: Radiation Oncology | Admitting: Radiation Oncology

## 2015-04-20 ENCOUNTER — Other Ambulatory Visit: Payer: Self-pay | Admitting: *Deleted

## 2015-04-20 DIAGNOSIS — Z510 Encounter for antineoplastic radiation therapy: Secondary | ICD-10-CM | POA: Diagnosis not present

## 2015-04-21 ENCOUNTER — Other Ambulatory Visit: Payer: Self-pay | Admitting: *Deleted

## 2015-04-21 ENCOUNTER — Ambulatory Visit
Admission: RE | Admit: 2015-04-21 | Discharge: 2015-04-21 | Disposition: A | Discharge status: Home / Self Care | Payer: Commercial Managed Care - HMO | Source: Ambulatory Visit | Attending: Radiation Oncology | Admitting: Radiation Oncology

## 2015-04-21 DIAGNOSIS — C250 Malignant neoplasm of head of pancreas: Secondary | ICD-10-CM

## 2015-04-21 DIAGNOSIS — Z510 Encounter for antineoplastic radiation therapy: Secondary | ICD-10-CM | POA: Diagnosis not present

## 2015-04-25 ENCOUNTER — Ambulatory Visit
Admission: RE | Admit: 2015-04-25 | Discharge: 2015-04-25 | Disposition: A | Discharge status: Home / Self Care | Payer: Commercial Managed Care - HMO | Source: Ambulatory Visit | Attending: Radiation Oncology | Admitting: Radiation Oncology

## 2015-04-25 ENCOUNTER — Other Ambulatory Visit: Payer: Self-pay | Admitting: *Deleted

## 2015-04-25 ENCOUNTER — Encounter: Payer: Self-pay | Admitting: Internal Medicine

## 2015-04-25 ENCOUNTER — Inpatient Hospital Stay: Payer: Commercial Managed Care - HMO | Attending: Internal Medicine

## 2015-04-25 ENCOUNTER — Inpatient Hospital Stay: Payer: Commercial Managed Care - HMO | Admitting: Internal Medicine

## 2015-04-25 VITALS — HR 89 | Temp 96.3°F | Resp 18 | Ht 68.0 in | Wt 175.5 lb

## 2015-04-25 DIAGNOSIS — Z510 Encounter for antineoplastic radiation therapy: Secondary | ICD-10-CM | POA: Diagnosis present

## 2015-04-25 DIAGNOSIS — C259 Malignant neoplasm of pancreas, unspecified: Secondary | ICD-10-CM | POA: Insufficient documentation

## 2015-04-25 DIAGNOSIS — C250 Malignant neoplasm of head of pancreas: Secondary | ICD-10-CM | POA: Diagnosis not present

## 2015-04-25 LAB — COMPREHENSIVE METABOLIC PANEL
ALK PHOS: 248 U/L — AB (ref 38–126)
ALT: 24 U/L (ref 17–63)
ANION GAP: 6 (ref 5–15)
AST: 26 U/L (ref 15–41)
Albumin: 2.8 g/dL — ABNORMAL LOW (ref 3.5–5.0)
BILIRUBIN TOTAL: 0.4 mg/dL (ref 0.3–1.2)
BUN: 24 mg/dL — AB (ref 6–20)
CALCIUM: 8.4 mg/dL — AB (ref 8.9–10.3)
CO2: 29 mmol/L (ref 22–32)
Chloride: 101 mmol/L (ref 101–111)
Creatinine, Ser: 1.61 mg/dL — ABNORMAL HIGH (ref 0.61–1.24)
GFR, EST AFRICAN AMERICAN: 47 mL/min — AB (ref >60)
GFR, EST NON AFRICAN AMERICAN: 40 mL/min — AB (ref >60)
Glucose, Bld: 350 mg/dL — ABNORMAL HIGH (ref 65–99)
Potassium: 4.5 mmol/L (ref 3.5–5.1)
SODIUM: 136 mmol/L (ref 135–145)
TOTAL PROTEIN: 6.9 g/dL (ref 6.5–8.1)

## 2015-04-25 LAB — CBC WITH DIFFERENTIAL/PLATELET
Basophils Absolute: 0 K/uL (ref 0–0.1)
Basophils Relative: 1 %
Eosinophils Absolute: 0.4 K/uL (ref 0–0.7)
Eosinophils Relative: 5 %
HCT: 36.1 % — ABNORMAL LOW (ref 40.0–52.0)
Hemoglobin: 11.6 g/dL — ABNORMAL LOW (ref 13.0–18.0)
Lymphocytes Relative: 13 %
Lymphs Abs: 0.9 K/uL — ABNORMAL LOW (ref 1.0–3.6)
MCH: 27.4 pg (ref 26.0–34.0)
MCHC: 32.1 g/dL (ref 32.0–36.0)
MCV: 85.3 fL (ref 80.0–100.0)
Monocytes Absolute: 0.6 K/uL (ref 0.2–1.0)
Monocytes Relative: 8 %
Neutro Abs: 5.1 K/uL (ref 1.4–6.5)
Neutrophils Relative %: 73 %
Platelets: 176 K/uL (ref 150–440)
RBC: 4.23 MIL/uL — ABNORMAL LOW (ref 4.40–5.90)
RDW: 16.1 % — ABNORMAL HIGH (ref 11.5–14.5)
WBC: 7 K/uL (ref 3.8–10.6)

## 2015-04-25 MED ORDER — FLUCONAZOLE 100 MG PO TABS: 100.0000 mg | ORAL_TABLET | Freq: Every day | ORAL | Status: AC

## 2015-04-25 NOTE — Progress Notes (Signed)
Pt states pain is 4-5 and located on his buttocks.  They had spoke to Chrystal and he ordered diflucan for pt.  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  And he takes lactulose every day.

## 2015-04-25 NOTE — Progress Notes (Signed)
Plan of care.   Patient was seen in our clinic previously for consideration for treatment of locally advanced pancreatic adenocarcinoma. Patient did not tolerate concurrent gemcitabine and radiation, so he continued with radiation only. Patient had multiple health-related issues, which were addressed by Dr. Donella Stade earlier today Patient will return to speak with Dr. Rogue Bussing in 1 month, and if needed, the decisions will be made regarding the treatment at that point.

## 2015-04-26 ENCOUNTER — Ambulatory Visit
Admission: RE | Admit: 2015-04-26 | Discharge: 2015-04-26 | Disposition: A | Discharge status: Home / Self Care | Payer: Commercial Managed Care - HMO | Source: Ambulatory Visit | Attending: Radiation Oncology | Admitting: Radiation Oncology

## 2015-04-26 DIAGNOSIS — Z510 Encounter for antineoplastic radiation therapy: Secondary | ICD-10-CM | POA: Diagnosis not present

## 2015-04-27 ENCOUNTER — Ambulatory Visit: Payer: Commercial Managed Care - HMO

## 2015-04-28 ENCOUNTER — Other Ambulatory Visit: Payer: Self-pay | Admitting: *Deleted

## 2015-04-28 ENCOUNTER — Ambulatory Visit
Admission: RE | Admit: 2015-04-28 | Discharge: 2015-04-28 | Disposition: A | Discharge status: Home / Self Care | Payer: Commercial Managed Care - HMO | Source: Ambulatory Visit | Attending: Radiation Oncology | Admitting: Radiation Oncology

## 2015-04-28 ENCOUNTER — Ambulatory Visit: Payer: Self-pay | Admitting: *Deleted

## 2015-04-28 DIAGNOSIS — Z510 Encounter for antineoplastic radiation therapy: Secondary | ICD-10-CM | POA: Diagnosis not present

## 2015-04-28 DIAGNOSIS — I5041 Acute combined systolic (congestive) and diastolic (congestive) heart failure: Secondary | ICD-10-CM

## 2015-04-28 NOTE — Patient Outreach (Signed)
[name] [facility]) Care Management  04/28/2015  [name] Nov 08, 1940 [id number]   Initial Transition of care call  I was able to speak with [name] , daughter and POA of [name]. [name] reports that the patient was recently discharged from [facility] on 1/4. After being hospitalized with sepsis, Dx pancreatic cancer, deconditioning, [name] states that the patient mostly follows up with his oncologist and has seen him the day prior to been released from the facility and patient recently started a radiation treatment.  [name] monitors his blood sugars at least 3 times a day, his most recent blood sugar today was 130, and takes his insulin Lantus as well as novolog sliding scale. [name] reports that the patient has all of his medications and denies having questions are concerns.  Patient is followed by  [facility] and the RN visited patient on today and Physical therapist will begin soon. Caregiver, [name] states that someone is at home with the patient at all times and that her husband will there with him on next week.  Plan Explain transition of care program Continue transition of care call on next week, [name] requested that I contact her on her cell number as she will return back to work on next week. [phone]. Patient will begin to keep a log of his blood sugars that he checks 3 times a day. Caregiver to call RNCM if any new concerns also understands to call MD for urgent concerns or changes in condition.   Garrett Draft, RN, [place] Management [phone] Mobile [phone]- Toll Free Main Office  [name] CM Care Plan Problem One        Most Recent Value   Care Plan Problem One  High Risk for readmission related to comordities and recent admission    Role Documenting the Problem One  Care Management [place] for Problem One  Active   [facility] Long Term Goal (31-90 days)  Patient will not experience a  hospital admisssion in the next 31 days   [facility] Long Term Goal Start Date  04/28/15   Interventions for Problem One Long Term Goal  Transition of care program initiated and explained follow up with caregiver, ensured that caregiver had my contact information  and provided [facility] 24 hour contact number   [facility] CM Short Term Goal #1 (0-30 days)  Patient will monitor and log blood sugars in the next 30 days   [facility] CM Short Term Goal #1 Start Date  04/28/15   Interventions for Short Term Goal #1  discussed with caregiver importance of keeping a log, when monitoring blood sugar, to track increases and decreases patterns, and to take record to MD appointments      Garrett Draft, RN, [place] Management [phone]- Mobile [phone]- [place]

## 2015-04-28 NOTE — Patient Outreach (Signed)
[facility]) Care Management  04/28/2015  Som Huda [place] 1940-09-09 [id number]   Phone call to [facility], spoke with Lance Morin (discharge planner) who stated that patient discharged from their facility on 04/26/15 with home health.  Patient staying with his daughter Lucina Mellow [phone]  Who is also patient's [place] along with patient's wife.  Phone call to patient and patient's daughter.  [facility] services explained and have been accepted. Plan:  This Education officer, museum will inform patient's assigned RNCM of patient's discharge home.   Sheralyn Boatman [facility] Care Management [phone]

## 2015-04-28 NOTE — Patient Outreach (Deleted)
[name] [facility]) Care Management  04/28/2015  [name] 1941-02-16 [id number]   Phone call to patient's spouse and discharge planner at [facility] for status update on patient.  Per patient's spouse, they are still not allowing patient  To put any pressure on his leg.  He continues to actively  participate in Physical and Occupational therapy.  No discharge date set yet.  This Education officer, museum also spoke with the discharge planner-[name] at the facility who confirms the above information.  No discharge date set yet.  Personal care services post discharged discussed.  Request form for an independent assessment for personal care services faxed to discharge planner for doctor to complete.  Plan:  This social worker will continue to follow up with the discharge planner regarding patient's discharge needs.    [name] [facility] Care Management [phone]

## 2015-04-30 ENCOUNTER — Emergency Department (HOSPITAL_COMMUNITY): Payer: Commercial Managed Care - HMO

## 2015-04-30 ENCOUNTER — Inpatient Hospital Stay (HOSPITAL_COMMUNITY)
Admission: EM | Admit: 2015-04-30 | Discharge: 2015-05-24 | DRG: 871 | Disposition: E | Payer: Commercial Managed Care - HMO | Attending: Internal Medicine | Admitting: Internal Medicine

## 2015-04-30 ENCOUNTER — Encounter (HOSPITAL_COMMUNITY): Payer: Self-pay | Admitting: Emergency Medicine

## 2015-04-30 DIAGNOSIS — Z955 Presence of coronary angioplasty implant and graft: Secondary | ICD-10-CM | POA: Diagnosis not present

## 2015-04-30 DIAGNOSIS — H269 Unspecified cataract: Secondary | ICD-10-CM | POA: Diagnosis present

## 2015-04-30 DIAGNOSIS — G4733 Obstructive sleep apnea (adult) (pediatric): Secondary | ICD-10-CM | POA: Diagnosis present

## 2015-04-30 DIAGNOSIS — K219 Gastro-esophageal reflux disease without esophagitis: Secondary | ICD-10-CM | POA: Diagnosis present

## 2015-04-30 DIAGNOSIS — Z794 Long term (current) use of insulin: Secondary | ICD-10-CM | POA: Diagnosis not present

## 2015-04-30 DIAGNOSIS — Z85828 Personal history of other malignant neoplasm of skin: Secondary | ICD-10-CM

## 2015-04-30 DIAGNOSIS — B961 Klebsiella pneumoniae [K. pneumoniae] as the cause of diseases classified elsewhere: Secondary | ICD-10-CM | POA: Diagnosis present

## 2015-04-30 DIAGNOSIS — Z923 Personal history of irradiation: Secondary | ICD-10-CM

## 2015-04-30 DIAGNOSIS — N179 Acute kidney failure, unspecified: Secondary | ICD-10-CM | POA: Diagnosis not present

## 2015-04-30 DIAGNOSIS — D696 Thrombocytopenia, unspecified: Secondary | ICD-10-CM | POA: Diagnosis present

## 2015-04-30 DIAGNOSIS — L89152 Pressure ulcer of sacral region, stage 2: Secondary | ICD-10-CM | POA: Diagnosis present

## 2015-04-30 DIAGNOSIS — Z87891 Personal history of nicotine dependence: Secondary | ICD-10-CM | POA: Diagnosis not present

## 2015-04-30 DIAGNOSIS — I2510 Atherosclerotic heart disease of native coronary artery without angina pectoris: Secondary | ICD-10-CM | POA: Diagnosis present

## 2015-04-30 DIAGNOSIS — Z87442 Personal history of urinary calculi: Secondary | ICD-10-CM

## 2015-04-30 DIAGNOSIS — R6520 Severe sepsis without septic shock: Secondary | ICD-10-CM | POA: Diagnosis present

## 2015-04-30 DIAGNOSIS — A419 Sepsis, unspecified organism: Secondary | ICD-10-CM | POA: Diagnosis present

## 2015-04-30 DIAGNOSIS — Z951 Presence of aortocoronary bypass graft: Secondary | ICD-10-CM | POA: Diagnosis not present

## 2015-04-30 DIAGNOSIS — J9600 Acute respiratory failure, unspecified whether with hypoxia or hypercapnia: Secondary | ICD-10-CM | POA: Diagnosis present

## 2015-04-30 DIAGNOSIS — C259 Malignant neoplasm of pancreas, unspecified: Secondary | ICD-10-CM | POA: Diagnosis present

## 2015-04-30 DIAGNOSIS — Z800 Family history of malignant neoplasm of digestive organs: Secondary | ICD-10-CM | POA: Diagnosis not present

## 2015-04-30 DIAGNOSIS — J45909 Unspecified asthma, uncomplicated: Secondary | ICD-10-CM | POA: Diagnosis present

## 2015-04-30 DIAGNOSIS — I252 Old myocardial infarction: Secondary | ICD-10-CM | POA: Diagnosis not present

## 2015-04-30 DIAGNOSIS — R531 Weakness: Secondary | ICD-10-CM

## 2015-04-30 DIAGNOSIS — D638 Anemia in other chronic diseases classified elsewhere: Secondary | ICD-10-CM | POA: Diagnosis present

## 2015-04-30 DIAGNOSIS — J9601 Acute respiratory failure with hypoxia: Secondary | ICD-10-CM | POA: Diagnosis not present

## 2015-04-30 DIAGNOSIS — I110 Hypertensive heart disease with heart failure: Secondary | ICD-10-CM | POA: Diagnosis present

## 2015-04-30 DIAGNOSIS — K830 Cholangitis: Secondary | ICD-10-CM | POA: Diagnosis present

## 2015-04-30 DIAGNOSIS — I509 Heart failure, unspecified: Secondary | ICD-10-CM | POA: Diagnosis present

## 2015-04-30 DIAGNOSIS — E1140 Type 2 diabetes mellitus with diabetic neuropathy, unspecified: Secondary | ICD-10-CM | POA: Diagnosis present

## 2015-04-30 DIAGNOSIS — F419 Anxiety disorder, unspecified: Secondary | ICD-10-CM | POA: Diagnosis present

## 2015-04-30 DIAGNOSIS — Z833 Family history of diabetes mellitus: Secondary | ICD-10-CM | POA: Diagnosis not present

## 2015-04-30 DIAGNOSIS — Z806 Family history of leukemia: Secondary | ICD-10-CM | POA: Diagnosis not present

## 2015-04-30 DIAGNOSIS — E1149 Type 2 diabetes mellitus with other diabetic neurological complication: Secondary | ICD-10-CM | POA: Diagnosis present

## 2015-04-30 DIAGNOSIS — N170 Acute kidney failure with tubular necrosis: Secondary | ICD-10-CM | POA: Diagnosis present

## 2015-04-30 DIAGNOSIS — N400 Enlarged prostate without lower urinary tract symptoms: Secondary | ICD-10-CM | POA: Diagnosis present

## 2015-04-30 DIAGNOSIS — E785 Hyperlipidemia, unspecified: Secondary | ICD-10-CM | POA: Diagnosis present

## 2015-04-30 DIAGNOSIS — Z4659 Encounter for fitting and adjustment of other gastrointestinal appliance and device: Secondary | ICD-10-CM

## 2015-04-30 DIAGNOSIS — R509 Fever, unspecified: Secondary | ICD-10-CM

## 2015-04-30 DIAGNOSIS — E870 Hyperosmolality and hypernatremia: Secondary | ICD-10-CM | POA: Diagnosis present

## 2015-04-30 DIAGNOSIS — C250 Malignant neoplasm of head of pancreas: Secondary | ICD-10-CM | POA: Diagnosis not present

## 2015-04-30 DIAGNOSIS — M1990 Unspecified osteoarthritis, unspecified site: Secondary | ICD-10-CM | POA: Diagnosis present

## 2015-04-30 LAB — CBC WITH DIFFERENTIAL/PLATELET
BASOS PCT: 1 %
Basophils Absolute: 0 10*3/uL (ref 0.0–0.1)
Basophils Absolute: 0 10*3/uL (ref 0.0–0.1)
Basophils Relative: 0 %
EOS ABS: 0.2 10*3/uL (ref 0.0–0.7)
Eosinophils Absolute: 0.1 10*3/uL (ref 0.0–0.7)
Eosinophils Relative: 2 %
Eosinophils Relative: 1 %
HEMATOCRIT: 35.7 % — AB (ref 39.0–52.0)
HEMATOCRIT: 35.5 % — AB (ref 39.0–52.0)
HEMOGLOBIN: 10.9 g/dL — AB (ref 13.0–17.0)
Hemoglobin: 10.7 g/dL — ABNORMAL LOW (ref 13.0–17.0)
LYMPHS ABS: 0.9 10*3/uL (ref 0.7–4.0)
LYMPHS ABS: 0.7 10*3/uL (ref 0.7–4.0)
LYMPHS PCT: 8 %
Lymphocytes Relative: 12 %
MCH: 27.3 pg (ref 26.0–34.0)
MCH: 26.8 pg (ref 26.0–34.0)
MCHC: 30.5 g/dL (ref 30.0–36.0)
MCHC: 30.1 g/dL (ref 30.0–36.0)
MCV: 89.5 fL (ref 78.0–100.0)
MCV: 89 fL (ref 78.0–100.0)
MONO ABS: 0.6 10*3/uL (ref 0.1–1.0)
MONO ABS: 0.5 10*3/uL (ref 0.1–1.0)
MONOS PCT: 8 %
MONOS PCT: 5 %
NEUTROS ABS: 5.8 10*3/uL (ref 1.7–7.7)
NEUTROS ABS: 8.6 10*3/uL — AB (ref 1.7–7.7)
NEUTROS PCT: 77 %
Neutrophils Relative %: 86 %
Platelets: 179 10*3/uL (ref 150–400)
Platelets: 153 10*3/uL (ref 150–400)
RBC: 3.99 MIL/uL — ABNORMAL LOW (ref 4.22–5.81)
RBC: 3.99 MIL/uL — ABNORMAL LOW (ref 4.22–5.81)
RDW: 15.4 % (ref 11.5–15.5)
RDW: 15.6 % — AB (ref 11.5–15.5)
WBC: 7.6 10*3/uL (ref 4.0–10.5)
WBC: 9.9 10*3/uL (ref 4.0–10.5)

## 2015-04-30 LAB — URINALYSIS, ROUTINE W REFLEX MICROSCOPIC
BILIRUBIN URINE: NEGATIVE
Glucose, UA: NEGATIVE mg/dL
Hgb urine dipstick: NEGATIVE
KETONES UR: NEGATIVE mg/dL
LEUKOCYTES UA: NEGATIVE
NITRITE: NEGATIVE
PROTEIN: NEGATIVE mg/dL
Specific Gravity, Urine: 1.02 (ref 1.005–1.030)
pH: 6 (ref 5.0–8.0)

## 2015-04-30 LAB — COMPREHENSIVE METABOLIC PANEL
ALK PHOS: 421 U/L — AB (ref 38–126)
ALK PHOS: 479 U/L — AB (ref 38–126)
ALT: 34 U/L (ref 17–63)
ALT: 47 U/L (ref 17–63)
ANION GAP: 10 (ref 5–15)
ANION GAP: 10 (ref 5–15)
AST: 71 U/L — ABNORMAL HIGH (ref 15–41)
AST: 99 U/L — ABNORMAL HIGH (ref 15–41)
Albumin: 2.5 g/dL — ABNORMAL LOW (ref 3.5–5.0)
Albumin: 2.3 g/dL — ABNORMAL LOW (ref 3.5–5.0)
BILIRUBIN TOTAL: 1.2 mg/dL (ref 0.3–1.2)
BILIRUBIN TOTAL: 2.6 mg/dL — AB (ref 0.3–1.2)
BUN: 19 mg/dL (ref 6–20)
BUN: 19 mg/dL (ref 6–20)
CALCIUM: 8.7 mg/dL — AB (ref 8.9–10.3)
CALCIUM: 8.3 mg/dL — AB (ref 8.9–10.3)
CO2: 24 mmol/L (ref 22–32)
CO2: 24 mmol/L (ref 22–32)
Chloride: 112 mmol/L — ABNORMAL HIGH (ref 101–111)
Chloride: 109 mmol/L (ref 101–111)
Creatinine, Ser: 1.17 mg/dL (ref 0.61–1.24)
Creatinine, Ser: 1.37 mg/dL — ABNORMAL HIGH (ref 0.61–1.24)
GFR calc Af Amer: 57 mL/min — ABNORMAL LOW (ref >60)
GFR calc non Af Amer: 60 mL/min — ABNORMAL LOW (ref >60)
GFR, EST NON AFRICAN AMERICAN: 49 mL/min — AB (ref >60)
Glucose, Bld: 86 mg/dL (ref 65–99)
Glucose, Bld: 140 mg/dL — ABNORMAL HIGH (ref 65–99)
POTASSIUM: 4.2 mmol/L (ref 3.5–5.1)
POTASSIUM: 3.8 mmol/L (ref 3.5–5.1)
Sodium: 146 mmol/L — ABNORMAL HIGH (ref 135–145)
Sodium: 143 mmol/L (ref 135–145)
TOTAL PROTEIN: 6.3 g/dL — AB (ref 6.5–8.1)
TOTAL PROTEIN: 6 g/dL — AB (ref 6.5–8.1)

## 2015-04-30 LAB — PROCALCITONIN: PROCALCITONIN: 3.17 ng/mL

## 2015-04-30 LAB — PROTIME-INR
INR: 1.08 (ref 0.00–1.49)
INR: 1.21 (ref 0.00–1.49)
PROTHROMBIN TIME: 14.2 s (ref 11.6–15.2)
PROTHROMBIN TIME: 15.5 s — AB (ref 11.6–15.2)

## 2015-04-30 LAB — LACTIC ACID, PLASMA
LACTIC ACID, VENOUS: 2.2 mmol/L — AB (ref 0.5–2.0)
Lactic Acid, Venous: 2.3 mmol/L (ref 0.5–2.0)
Lactic Acid, Venous: 2.8 mmol/L (ref 0.5–2.0)

## 2015-04-30 LAB — APTT: aPTT: 32 seconds (ref 24–37)

## 2015-04-30 LAB — MRSA PCR SCREENING: MRSA by PCR: NEGATIVE

## 2015-04-30 LAB — I-STAT CG4 LACTIC ACID, ED: LACTIC ACID, VENOUS: 1.53 mmol/L (ref 0.5–2.0)

## 2015-04-30 MED ORDER — DEXTROSE 5 % IV SOLN
2.0000 g | Freq: Once | INTRAVENOUS | Status: AC
  Administered 2015-04-30: 2 g via INTRAVENOUS
  Filled 2015-04-30: qty 2

## 2015-04-30 MED ORDER — INSULIN GLARGINE 100 UNIT/ML ~~LOC~~ SOLN
5.0000 [IU] | Freq: Two times a day (BID) | SUBCUTANEOUS | Status: DC
  Administered 2015-04-30 – 2015-05-02 (×4): 5 [IU] via SUBCUTANEOUS
  Filled 2015-04-30 (×5): qty 0.05

## 2015-04-30 MED ORDER — GABAPENTIN 300 MG PO CAPS
300.0000 mg | ORAL_CAPSULE | Freq: Two times a day (BID) | ORAL | Status: DC
  Filled 2015-04-30: qty 1

## 2015-04-30 MED ORDER — ASPIRIN EC 81 MG PO TBEC
81.0000 mg | DELAYED_RELEASE_TABLET | Freq: Every day | ORAL | Status: DC
  Administered 2015-05-02: 81 mg via ORAL
  Filled 2015-04-30 (×2): qty 1

## 2015-04-30 MED ORDER — SENNA 8.6 MG PO TABS
4.0000 | ORAL_TABLET | Freq: Every day | ORAL | Status: DC
  Administered 2015-05-02: 34.4 mg via ORAL
  Filled 2015-04-30: qty 4

## 2015-04-30 MED ORDER — ONDANSETRON HCL 4 MG/2ML IJ SOLN
4.0000 mg | Freq: Four times a day (QID) | INTRAMUSCULAR | Status: DC | PRN
Start: 2015-04-30 — End: 2015-05-02

## 2015-04-30 MED ORDER — ACETAMINOPHEN 325 MG PO TABS
650.0000 mg | ORAL_TABLET | Freq: Once | ORAL | Status: AC
  Administered 2015-04-30: 650 mg via ORAL
  Filled 2015-04-30: qty 2

## 2015-04-30 MED ORDER — DEXTROSE 5 % IV SOLN
1.0000 g | Freq: Two times a day (BID) | INTRAVENOUS | Status: DC
  Filled 2015-04-30: qty 1

## 2015-04-30 MED ORDER — PREDNISONE 10 MG PO TABS: 10.0000 mg | ORAL_TABLET | Freq: Every day | ORAL | Status: DC

## 2015-04-30 MED ORDER — MELOXICAM 7.5 MG PO TABS
7.5000 mg | ORAL_TABLET | Freq: Every day | ORAL | Status: DC
  Filled 2015-04-30: qty 1

## 2015-04-30 MED ORDER — SODIUM CHLORIDE 0.9 % IJ SOLN
3.0000 mL | Freq: Two times a day (BID) | INTRAMUSCULAR | Status: DC
  Administered 2015-05-01 – 2015-05-02 (×2): 3 mL via INTRAVENOUS

## 2015-04-30 MED ORDER — ONDANSETRON HCL 4 MG PO TABS: 4.0000 mg | ORAL_TABLET | Freq: Three times a day (TID) | ORAL | Status: DC | PRN

## 2015-04-30 MED ORDER — FENTANYL 25 MCG/HR TD PT72
25.0000 ug | MEDICATED_PATCH | TRANSDERMAL | Status: DC
  Administered 2015-05-01: 25 ug via TRANSDERMAL
  Filled 2015-04-30: qty 1

## 2015-04-30 MED ORDER — INSULIN GLARGINE 100 UNIT/ML ~~LOC~~ SOLN
40.0000 [IU] | Freq: Two times a day (BID) | SUBCUTANEOUS | Status: DC
  Filled 2015-04-30: qty 0.4

## 2015-04-30 MED ORDER — POLYETHYLENE GLYCOL 3350 17 G PO PACK: 17.0000 g | PACK | Freq: Every day | ORAL | Status: DC | PRN

## 2015-04-30 MED ORDER — INSULIN ASPART 100 UNIT/ML ~~LOC~~ SOLN
0.0000 [IU] | Freq: Three times a day (TID) | SUBCUTANEOUS | Status: DC
  Administered 2015-05-01 (×3): 3 [IU] via SUBCUTANEOUS
  Administered 2015-05-02: 1 [IU] via SUBCUTANEOUS

## 2015-04-30 MED ORDER — PANTOPRAZOLE SODIUM 40 MG PO TBEC
40.0000 mg | DELAYED_RELEASE_TABLET | Freq: Every day | ORAL | Status: DC
  Administered 2015-05-02: 40 mg via ORAL
  Filled 2015-04-30: qty 1

## 2015-04-30 MED ORDER — CEFEPIME HCL 2 G IJ SOLR: 2.0000 g | Freq: Once | INTRAMUSCULAR | Status: DC

## 2015-04-30 MED ORDER — VANCOMYCIN HCL IN DEXTROSE 1-5 GM/200ML-% IV SOLN: 1000.0000 mg | Freq: Once | INTRAVENOUS | Status: DC

## 2015-04-30 MED ORDER — CEFEPIME HCL 1 G IJ SOLR
1.0000 g | Freq: Two times a day (BID) | INTRAMUSCULAR | Status: DC
  Administered 2015-04-30 – 2015-05-02 (×4): 1 g via INTRAVENOUS
  Filled 2015-04-30 (×4): qty 1

## 2015-04-30 MED ORDER — DEXTROSE 5 % IV SOLN
1.0000 g | Freq: Three times a day (TID) | INTRAVENOUS | Status: DC
  Filled 2015-04-30 (×2): qty 1

## 2015-04-30 MED ORDER — SODIUM CHLORIDE 0.9 % IV BOLUS (SEPSIS)
1000.0000 mL | Freq: Once | INTRAVENOUS | Status: AC
  Administered 2015-04-30: 1000 mL via INTRAVENOUS

## 2015-04-30 MED ORDER — HYDROCODONE-ACETAMINOPHEN 5-325 MG PO TABS
1.0000 | ORAL_TABLET | ORAL | Status: DC | PRN
  Administered 2015-04-30 – 2015-05-01 (×4): 2 via ORAL
  Administered 2015-05-02: 1 via ORAL
  Filled 2015-04-30 (×2): qty 2
  Filled 2015-04-30: qty 1
  Filled 2015-04-30 (×2): qty 2
  Filled 2015-04-30: qty 1

## 2015-04-30 MED ORDER — IOHEXOL 300 MG/ML  SOLN
25.0000 mL | Freq: Once | INTRAMUSCULAR | Status: AC | PRN
Start: 2015-04-30 — End: 2015-04-30
  Administered 2015-04-30: 25 mL via ORAL

## 2015-04-30 MED ORDER — IOHEXOL 300 MG/ML  SOLN
100.0000 mL | Freq: Once | INTRAMUSCULAR | Status: AC | PRN
  Administered 2015-04-30: 100 mL via INTRAVENOUS

## 2015-04-30 MED ORDER — SODIUM CHLORIDE 0.9 % IV SOLN
INTRAVENOUS | Status: DC
  Administered 2015-04-30 – 2015-05-01 (×3): via INTRAVENOUS

## 2015-04-30 MED ORDER — ACETAMINOPHEN 325 MG PO TABS
650.0000 mg | ORAL_TABLET | Freq: Four times a day (QID) | ORAL | Status: DC | PRN
  Administered 2015-04-30 – 2015-05-01 (×2): 650 mg via ORAL
  Filled 2015-04-30 (×2): qty 2

## 2015-04-30 MED ORDER — LACTULOSE 10 GM/15ML PO SOLN
30.0000 g | Freq: Every day | ORAL | Status: DC | PRN
  Filled 2015-04-30: qty 45

## 2015-04-30 MED ORDER — POLYETHYLENE GLYCOL 3350 17 GM/SCOOP PO POWD
17.0000 g | Freq: Every day | ORAL | Status: DC | PRN
  Filled 2015-04-30: qty 255

## 2015-04-30 MED ORDER — VANCOMYCIN HCL IN DEXTROSE 1-5 GM/200ML-% IV SOLN
1000.0000 mg | INTRAVENOUS | Status: DC
  Administered 2015-04-30 – 2015-05-01 (×2): 1000 mg via INTRAVENOUS
  Filled 2015-04-30 (×2): qty 200

## 2015-04-30 MED ORDER — ONDANSETRON HCL 4 MG PO TABS: 4.0000 mg | ORAL_TABLET | Freq: Four times a day (QID) | ORAL | Status: DC | PRN

## 2015-04-30 MED ORDER — ENOXAPARIN SODIUM 40 MG/0.4ML ~~LOC~~ SOLN
40.0000 mg | SUBCUTANEOUS | Status: DC
  Filled 2015-04-30: qty 0.4

## 2015-04-30 MED ORDER — PIPERACILLIN-TAZOBACTAM 3.375 G IVPB
3.3750 g | Freq: Once | INTRAVENOUS | Status: AC
  Administered 2015-04-30: 3.375 g via INTRAVENOUS
  Filled 2015-04-30: qty 50

## 2015-04-30 MED ORDER — ONDANSETRON HCL 4 MG/2ML IJ SOLN
4.0000 mg | Freq: Once | INTRAMUSCULAR | Status: AC
Start: 2015-04-30 — End: 2015-04-30
  Administered 2015-04-30: 4 mg via INTRAVENOUS
  Filled 2015-04-30: qty 2

## 2015-04-30 MED ORDER — MORPHINE SULFATE (PF) 4 MG/ML IV SOLN
4.0000 mg | Freq: Once | INTRAVENOUS | Status: AC
  Administered 2015-04-30: 4 mg via INTRAVENOUS
  Filled 2015-04-30: qty 1

## 2015-04-30 NOTE — ED Provider Notes (Signed)
CSN: [id number]     Arrival date & time 04/29/2015  [id number] History   First MD Initiated Contact with Patient 04/27/2015 [phone]     Chief Complaint  Patient presents with  . Weakness      HPI  Patient presents for evaluation of generalized weakness, and fever.  Significant history of pancreatic cancer. Locally invasive without metastasis. Was weakened and deconditioned after an admission with an episode of sepsis. Therefore chemotherapy was discontinued. Still getting radiation therapy. Admitted in November with Klebsiella pneumonia and bacteremia. Admitted in December with sepsis without identified organism. Had had recent ERCP and biliary source was considered. Did have pneumobilia on imaging then. However, cultures were negative. Patient discontinued antibiotics on 12-28 after completing outpatient course, at his extended care facility. He has been inpatient rehabilitation since his admission.  Patient arrives today and his pain complains that his legs are weak and "my butt hurts".  Noted temp 102 upon arrival. He is mentating well and able to offer details of his own history.  Denies headache. No neck pain or stiffness. His shortness of breath or cough. No abdominal pain nausea or vomiting. Leg weakness. Not unilateral.   Past Medical History  Diagnosis Date  . Hyperlipidemia   . H/O cardiac catheterization 06/03/06,12/03/11  . BPH (benign prostatic hypertrophy)   . Anxiety   . Syncope     LAUGHING INDUCED  . Myocardial infarction ([place])     x 2  . CHF (congestive heart failure) ([place])   . CAD (coronary artery disease) 06/03/06    cypher 2.5 x 51mm DES for 80% mid RCA sees Dr. Ubaldo Glassing  . Asthma     as a child  . Sleep apnea     hx of  . Kidney stone     hx of  . GERD (gastroesophageal reflux disease)     on medication for  . Cancer ([place])     hx of skin ca in "corner of left eye"  . Arthritis   . Anemia     taking iron  . Cataract, bilateral   . Neuromuscular disorder ([place])    diabetic neuropathy  . Diabetes mellitus without complication ([place])   . Pancreatic adenocarcinoma [facility])    Past Surgical History  Procedure Laterality Date  . Spine surgery  9/05    CERVICAL LAMINECTOMY  . Cervical herniated    . Eye surgery      as a child  . Coronary artery bypass graft  12/12/2011    Procedure: CORONARY ARTERY BYPASS GRAFTING (CABG);  Surgeon: Gaye Pollack, MD;  Location: [facility];  Service: Open Heart Surgery;  Laterality: N/A;  coronary artery bypass graft times four using left internal mammary artery, right leg greater saphenous vein harvested endoscopically  . Cardiac catheterization N/A 10/26/2014    Procedure: Left Heart Cath;  Surgeon: Isaias Cowman, MD;  Location: [facility];  Service: Cardiovascular;  Laterality: N/A;  . Endoscopic retrograde cholangiopancreatography (ercp) with propofol N/A 02/13/2015    Procedure: ENDOSCOPIC RETROGRADE CHOLANGIOPANCREATOGRAPHY (ERCP) WITH PROPOFOL;  Surgeon: Lucilla Lame, MD;  Location: [facility]OS[facility];  Service: Endoscopy;  Laterality: N/A;  . Eus N/A 02/23/2015    Procedure: UPPER ENDOSCOPIC ULTRASOUND (EUS) LINEAR;  Surgeon: Holly Bodily, MD;  Location: [facility] ENDOSCOPY;  Service: Gastroenterology;  Laterality: N/A;   Family History  Problem Relation Age of Onset  . Diabetes Mellitus II Mother     Also Brother and sister  . Pancreatic cancer Mother   .  Leukemia Father   . Diabetes Mellitus II Sister   . Diabetes Mellitus II Brother    Social History  Substance Use Topics  . Smoking status: Former Smoker    Types: Cigarettes, Cigars    Quit date: 12/03/1977  . Smokeless tobacco: Never Used  . Alcohol Use: No    Review of Systems  Constitutional: Positive for fever, chills and fatigue. Negative for diaphoresis and appetite change.  HENT: Negative for mouth sores, sore throat and trouble swallowing.   Eyes: Negative for visual disturbance.  Respiratory: Negative for cough, chest tightness,  shortness of breath and wheezing.   Cardiovascular: Negative for chest pain.  Gastrointestinal: Negative for nausea, vomiting, abdominal pain, diarrhea and abdominal distention.  Endocrine: Negative for polydipsia, polyphagia and polyuria.  Genitourinary: Negative for dysuria, frequency and hematuria.  Musculoskeletal: Negative for gait problem.  Skin: Negative for color change, pallor and rash.       Buttock pain  Neurological: Positive for weakness. Negative for dizziness, syncope, light-headedness and headaches.  Hematological: Does not bruise/bleed easily.  Psychiatric/Behavioral: Negative for behavioral problems and confusion.      Allergies  Zithromax and Lisinopril  Home Medications   Prior to Admission medications   Medication Sig Start Date End Date Taking? Authorizing Provider  acetaminophen (TYLENOL) 650 MG CR tablet Take 650 mg by mouth every 6 (six) hours as needed for pain.     Historical Provider, MD  aspirin EC 81 MG tablet Take 81 mg by mouth daily.    Historical Provider, MD  cephALEXin (KEFLEX) 500 MG capsule Take 1 capsule (500 mg total) by mouth 3 (three) times daily. 04/10/15   Cammie Sickle, MD  fentaNYL (DURAGESIC - DOSED MCG/HR) 25 MCG/HR patch Place 1 patch (25 mcg total) onto the skin every 3 (three) days. 04/18/15   Noreene Filbert, MD  fluconazole (DIFLUCAN) 100 MG tablet Take 1 tablet (100 mg total) by mouth daily. 04/25/15   Noreene Filbert, MD  gabapentin (NEURONTIN) 300 MG capsule Take 300 mg by mouth 2 (two) times daily.    Historical Provider, MD  HYDROcodone-acetaminophen (NORCO/VICODIN) 5-325 MG tablet Take 2 tablets by mouth every 6 (six) hours as needed. For pain. 04/06/15   Epifanio Lesches, MD  insulin aspart (NOVOLOG) 100 UNIT/ML injection Inject 5 Units into the skin 3 (three) times daily with meals. Patient taking differently: Inject 2-10 Units into the skin 3 (three) times daily with meals. Take 7 units with each meal along with sliding  scale accordingly  151-200=2 units 201-250=4 units 251-300=6 units 301-351=8 units 400+=10 units and call MD 04/06/15   Epifanio Lesches, MD  insulin glargine (LANTUS) 100 UNIT/ML injection Inject 0.4 mLs (40 Units total) into the skin 2 (two) times daily. Patient taking differently: Inject 54 Units into the skin 2 (two) times daily.  04/06/15   Epifanio Lesches, MD  lactulose (CHRONULAC) 10 GM/15ML solution Take 45 mLs (30 g total) by mouth daily as needed for mild constipation. Patient taking differently: Take 30 g by mouth daily.  04/06/15   Epifanio Lesches, MD  lovastatin (MEVACOR) 20 MG tablet Take 20 mg by mouth at bedtime.    Historical Provider, MD  meloxicam (MOBIC) 7.5 MG tablet Take 1 tablet by mouth daily. 03/01/15   Historical Provider, MD  metroNIDAZOLE (FLAGYL) 500 MG tablet Take 1 tablet (500 mg total) by mouth 3 (three) times daily. 04/10/15   Cammie Sickle, MD  omeprazole (PRILOSEC) 20 MG capsule Take 20 mg by mouth 2 (  two) times daily.    Historical Provider, MD  ondansetron (ZOFRAN) 4 MG tablet Take 4 mg by mouth every 8 (eight) hours as needed for nausea or vomiting. Reported on 04/10/2015    Historical Provider, MD  oxycodone (OXY-IR) 5 MG capsule Take 5 mg by mouth every 6 (six) hours as needed for pain.    Historical Provider, MD  polyethylene glycol powder (GLYCOLAX/MIRALAX) powder Take 17 g by mouth daily as needed for mild constipation. Reported on 04/10/2015 01/10/15   Historical Provider, MD  predniSONE (DELTASONE) 10 MG tablet Take 10 mg by mouth daily with breakfast. Reported on 04/25/2015    Historical Provider, MD  torsemide (DEMADEX) 20 MG tablet Take 20 mg by mouth 2 (two) times daily.     Historical Provider, MD   BP 145/88 mmHg  Pulse 108  Temp(Src) 102 F (38.9 C) (Oral)  Resp 24  SpO2 94% Physical Exam  Constitutional: He is oriented to person, place, and time. He appears well-developed and well-nourished. No distress.  HENT:  Head:  Normocephalic.  Eyes: Conjunctivae are normal. Pupils are equal, round, and reactive to light. No scleral icterus.  Neck: Normal range of motion. Neck supple. No thyromegaly present.  Cardiovascular: Normal rate and regular rhythm.  Exam reveals no gallop and no friction rub.   No murmur heard. Pulmonary/Chest: Effort normal and breath sounds normal. No respiratory distress. He has no wheezes. He has no rales.    Abdominal: Soft. Bowel sounds are normal. He exhibits no distension. There is no tenderness. There is no rebound.  Musculoskeletal: Normal range of motion.       Back:  Neurological: He is alert and oriented to person, place, and time.  He is able to lift each leg off the bed independently. No focal or unilateral weakness to the upper or lower extremities.  Skin: Skin is warm and dry. No rash noted.  Good peripheral perfusion. Capillary refill 2 seconds.  Psychiatric: He has a normal mood and affect. His behavior is normal.    ED Course  Procedures (including critical care time) Labs Review Labs Reviewed  CULTURE, BLOOD (ROUTINE X 2)  CULTURE, BLOOD (ROUTINE X 2)  URINE CULTURE  CBC WITH DIFFERENTIAL/PLATELET  COMPREHENSIVE METABOLIC PANEL  URINALYSIS, ROUTINE W REFLEX MICROSCOPIC (NOT AT [facility])  I-STAT CG4 LACTIC ACID, ED    Imaging Review No results found. I have personally reviewed and evaluated these images and lab results as part of my medical decision-making.   EKG Interpretation None      MDM   Final diagnoses:  Sacral decubitus ulcer, stage II  Fever, unspecified fever cause    Temp 103. Heart rate 108. 40/85 mentating well. Has surgical criteria. Plan is fluids Tylenol lactate x-ray urine blood cultures blood testing reevaluation for source. Possibilities would include pneumonia, skin, abdominal/biliary.  Concern for biliary source. Patient had previous admission with negative cultures but elevated enzymes. They are persistently elevated and he has  known biliary stent. Discussed with hospitalist. Patient will be admitted. Empiric antibiotics with vancomycin, Zosyn.    Tanna Furry, MD 05/04/15 [phone]

## 2015-04-30 NOTE — ED Notes (Signed)
Portable xray at bedside.

## 2015-04-30 NOTE — ED Notes (Signed)
Awake. Verbally responsive. A/O x4. Resp even and unlabored. No audible adventitious breath sounds noted. ABC's intact. Pt had no adverse reaction to ABT #1. IV infused NS without difficulty.

## 2015-04-30 NOTE — ED Notes (Signed)
Awake. Verbally responsive. A/O x4. Resp even and unlabored. No audible adventitious breath sounds noted. ABC's intact. IV patent and intact. Pt had no adverse reaction from IV ABT #3. Family at bedside.

## 2015-04-30 NOTE — Progress Notes (Addendum)
ANTIBIOTIC CONSULT NOTE - INITIAL  Pharmacy Consult for Vancomycin Indication: pneumonia  Allergies  Allergen Reactions  . Zithromax [Azithromycin] Anaphylaxis and Other (See Comments)    Reaction:  Stroke-like symptoms   . Lisinopril Other (See Comments)    Per spouse, pt did not tolerate well in the past.      Patient Measurements: Height: 68 inches TBW: 79.6 kg   Vital Signs: Temp: 102 F (38.9 C) (01/08 0947) Temp Source: Oral (01/08 0947) BP: 145/88 mmHg (01/08 0947) Pulse Rate: 108 (01/08 0947) Intake/Output from previous day:   Intake/Output from this shift:    Labs: No results for input(s): WBC, HGB, PLT, LABCREA, CREATININE in the last 72 hours. Estimated Creatinine Clearance: 38.9 mL/min (by C-G formula based on Cr of 1.61). No results for input(s): VANCOTROUGH, VANCOPEAK, VANCORANDOM, GENTTROUGH, GENTPEAK, GENTRANDOM, TOBRATROUGH, TOBRAPEAK, TOBRARND, AMIKACINPEAK, AMIKACINTROU, AMIKACIN in the last 72 hours.   Microbiology: No results found for this or any previous visit (from the past 720 hour(s)).  Medical History: Past Medical History  Diagnosis Date  . Hyperlipidemia   . H/O cardiac catheterization 06/03/06,12/03/11  . BPH (benign prostatic hypertrophy)   . Anxiety   . Syncope     LAUGHING INDUCED  . Myocardial infarction ([place])     x 2  . CHF (congestive heart failure) ([place])   . CAD (coronary artery disease) 06/03/06    cypher 2.5 x 62mm DES for 80% mid RCA sees Dr. Ubaldo Glassing  . Asthma     as a child  . Sleep apnea     hx of  . Kidney stone     hx of  . GERD (gastroesophageal reflux disease)     on medication for  . 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 Arthritis   . Anemia     taking iron  . Cataract, bilateral   . Neuromuscular disorder ([place])     diabetic neuropathy  . Diabetes mellitus without complication ([place])   . Pancreatic adenocarcinoma ([facility])     Medications:  Scheduled:  . acetaminophen  650 mg Oral Once    Infusions:  . ceFEPIme (MAXIPIME) 2 GM IVP    . sodium chloride    . vancomycin     PRN:  Assessment: 74yoM with pancreatic cancer getting XRT but chemo scheduled on 04/03/15 was put on hold because of sepsis. He was admitted in November with Klebsiella PNA and bacteremia and then in December with sepsis without an organism identified. Biliary was considered as source because of his recent ERCP and pneumobilia on imaging. Pt was discharged on Keflex and Flagyl which was completed on 12/28. He is now presenting to the ER with generalized weakness and fever. Vancomycin to be started. Cefepime 2gm IV x 1 ordered in ER.  Goal of Therapy:  Vancomycin trough level 15-20 mcg/ml  Eradication of infection Appropriate antibiotic dosing for indication and renal function.  Plan:  Will start Vancomycin 1gm IV Q24h.  Will check Vanco trough level at steady state. Will f/u Scr and Cx. Will f/u if additional Cefepime is ordered.  Garnet Sierras 04/25/2015,9:52 AM   Addendum:  Cefepime to be added to Vancomycin. Will give Cefepime 1gm IV Q8h starting at 2000.  Garnet Sierras, PharmD 05/01/2015 @1615    Addendum: Indication changed from PNA to sepsis. Scr increased from 1.17 to 1.37 tonight. Will change Cefepime to 1gm IV Q12h based on est CrCl=46. Will continue Vancomycin 1hm  IV Q24h for now.  Garnet Sierras, PharmD 05/23/2015 @ [phone]

## 2015-04-30 NOTE — ED Notes (Signed)
Bed: [id number] Expected date: 05/18/2015 Expected time: 9:28 AM Means of arrival:  Comments: EMS

## 2015-04-30 NOTE — ED Notes (Addendum)
Pt arrived via EMS with report of fever (102.1), generalized weakness and yeast infections. Pt denies dizziness/lightheadedness, n/v/d, and abd pain. Pt currently receiving radiation for pancreatic CA. Pt has Stg II coccyx wound on arrival.

## 2015-04-30 NOTE — ED Notes (Addendum)
PATIENT CAN GO TO FLOOR AT 1520

## 2015-04-30 NOTE — ED Notes (Signed)
Awake. Verbally responsive. A/O x4. Resp even and unlabored. No audible adventitious breath sounds noted. ABC's intact. Pt had no adverse reaction to IV ABT # 2. Family at  Bedside.

## 2015-04-30 NOTE — H&P (Addendum)
[facility] History and Physical  [name] [id number] DOB: 1940/04/29 DOA: 05/19/2015  Referring physician: ED physician, Dr. Jeneen Rinks   PCP: Rusty Aus., MD   Oncologist at [place]: Dr. Burlene Arnt   Chief Complaint: lethargy   HPI:  Pt is 75 yo male with known pancreatic cancer, underwent ERCP in 02/13/2015 and had stenting of CBD, sees oncologist at [facility], presented to [facility] ED for evaluation of progressive lethargy and weakness. Please note that pt is too somnolent to provide any history at the time of the admission and daughter who is POA is able to provide some details. She explains that pt has been recently hospitalized for sepsis, Klebsiella pneumonia and bacteremia (November 2016), again admitted December 2016 for sepsis and at that time biliary source was thought to be the culprit (blood cultures were negative at that time). Pt has completed course of ABX on December 28th, 2016 and daughter says pt has continued to decline, minimal oral intake, has been mostly bed bound, fevers as high as 103 F. No reported chest pain or shortness of breath, no reported abd or urinary concerns.   In ED, pt noted to be somnolent, minimally verbal, vital signs notable for Tmax 103 F, HR up to 120, RR up to 22, blood pressure stable at 144/64. Blood work notable Na 146 --> 143, Cr 11.17 --> 1.37, Hg stable at 10.7. CT abd notable for progressive dilatation of the CBD and pancreatic duct, s/p CBD stenting. TRH asked to admit for further evaluation.   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 - sphincterotomy was performed, o ne plastic stent was placed into the common bile duct. - recommendation was to repeat ERCP in 3 months to exchange stent  EGD/EUS 11/03 - Normal esophagus, Normal stomach. - Biliary stent emerging from the major ampulla. - Otherwise normal examined duodenum.  - mass identified in the  pancreatic head, ? adenoca, staged T3 N0 Mx by endosonographic criteria.  - cystic lesion was seen in the pancreatic body. - main pancreatic duct had a dilated endosonographic appearance with prominent side branches  - One stent was visualized endosonographically in the common bile duct.  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  - No evidence of any distant metastatic disease-on the recent MRI of the liver - follows with Dr. Burlene Arnt at [facility] - will send electronic note to  make oncologist aware   Lovenox SQ for DVt prophylaxis   Radiological Exams on Admission: Ct Abdomen Pelvis W Contrast 05/13/2015  Progressive dilatation of the common bile duct and pancreatic duct status post CBD stenting. Pneumobilia is identified compatible with common bile duct patency. Pancreatic head mass is not significantly changed in size from previous exam. 2. There is an acute right eighth rib fracture identified. 3. Aortic atherosclerosis 4. No evidence for bowel obstruction or abscess formation.   Dg Chest Port 1 View 05/01/2015  No  acute cardiopulmonary process. 2. Healing right-sided rib fractures. No pneumothorax or pleural effusion. 3. Aortic atherosclerosis   Code Status: Full Family Communication: Family at bedside Disposition Plan: Admit for further evaluation    [facility] [id number]   Review of Systems:  Pt unable to provide, somnolent     Past Medical History  Diagnosis Date  . Hyperlipidemia   . H/O cardiac catheterization 06/03/06,12/03/11  . BPH (benign prostatic hypertrophy)   . Anxiety   . Syncope     LAUGHING INDUCED  . Myocardial infarction ([place])     x 2  . CHF (congestive heart failure) ([place])   . CAD (coronary artery disease) 06/03/06    cypher 2.5 x 38mm DES for 80% mid RCA sees Dr. Ubaldo Glassing  . Asthma     as a child  . Sleep apnea     hx of  . Kidney stone     hx of  . GERD (gastroesophageal reflux disease)     on medication for  . Cancer ([place])     hx of skin ca in "corner of left eye"  . Arthritis   . Anemia     taking iron  . Cataract, bilateral   . Neuromuscular disorder ([place])     diabetic neuropathy  . Diabetes mellitus without complication ([place])   . Pancreatic adenocarcinoma [facility])     Past Surgical History  Procedure Laterality Date  . Spine surgery  9/05    CERVICAL LAMINECTOMY  . Cervical herniated    . Eye surgery      as a child  . Coronary artery bypass graft  12/12/2011    Procedure: CORONARY ARTERY BYPASS GRAFTING (CABG);  Surgeon: Gaye Pollack, MD;  Location: [place];  Service: Open Heart Surgery;  Laterality: N/A;  coronary artery bypass graft times four using left internal mammary artery, right leg greater saphenous vein harvested endoscopically  . Cardiac catheterization N/A 10/26/2014    Procedure: Left Heart Cath;  Surgeon: Isaias Cowman, MD;  Location: [place] CV LAB;  Service: Cardiovascular;  Laterality: N/A;  . Endoscopic retrograde cholangiopancreatography (ercp) with propofol N/A 02/13/2015    Procedure: ENDOSCOPIC RETROGRADE  CHOLANGIOPANCREATOGRAPHY (ERCP) WITH PROPOFOL;  Surgeon: Lucilla Lame, MD;  Location: [facility] ENDOSCOPY;  Service: Endoscopy;  Laterality: N/A;  . Eus N/A 02/23/2015    Procedure: UPPER ENDOSCOPIC ULTRASOUND (EUS) LINEAR;  Surgeon: Holly Bodily, MD;  Location: [facility] ENDOSCOPY;  Service: Gastroenterology;  Laterality: N/A;    Social History:  reports that he quit smoking about 37 years ago. His smoking use included Cigarettes and Cigars. He has never used smokeless tobacco. He reports that he does not drink alcohol or use illicit drugs.  Allergies  Allergen Reactions  . Zithromax [Azithromycin] Anaphylaxis and Other (See Comments)    Reaction:  Stroke-like symptoms   . Lisinopril Other (See Comments)    Per spouse, pt did not tolerate well in the past.  Family History  Problem Relation Age of Onset  . Diabetes Mellitus II Mother     Also Brother and sister  . Pancreatic cancer Mother   . Leukemia Father   . Diabetes Mellitus II Sister   . Diabetes Mellitus II Brother     Prior to Admission medications   Medication Sig Start Date End Date Taking? Authorizing Provider  acetaminophen (TYLENOL) 650 MG CR tablet Take 650 mg by mouth every 6 (six) hours as needed for pain.    Yes Historical Provider, MD  aspirin EC 81 MG tablet Take 81 mg by mouth daily.   Yes Historical Provider, MD  fentaNYL (DURAGESIC - DOSED MCG/HR) 25 MCG/HR patch Place 1 patch (25 mcg total) onto the skin every 3 (three) days. 04/18/15  Yes Noreene Filbert, MD  fluconazole (DIFLUCAN) 100 MG tablet Take 1 tablet (100 mg total) by mouth daily. 04/25/15  Yes Noreene Filbert, MD  gabapentin (NEURONTIN) 300 MG capsule Take 300 mg by mouth 2 (two) times daily.   Yes Historical Provider, MD  HYDROcodone-acetaminophen (NORCO/VICODIN) 5-325 MG tablet Take 2 tablets by mouth every 6 (six) hours as needed. For pain. Patient taking differently: Take 2 tablets by mouth every 6 (six) hours as needed for moderate pain or severe  pain. For pain. 04/06/15  Yes Epifanio Lesches, MD  insulin aspart (NOVOLOG) 100 UNIT/ML injection Inject 5 Units into the skin 3 (three) times daily with meals. Patient taking differently: Inject 2-10 Units into the skin 3 (three) times daily with meals. Take 7 units with each meal along with sliding scale accordingly  151-200=2 units 201-250=4 units 251-300=6 units 301-351=8 units 400+=10 units and call MD 04/06/15  Yes Epifanio Lesches, MD  insulin glargine (LANTUS) 100 UNIT/ML injection Inject 0.4 mLs (40 Units total) into the skin 2 (two) times daily. 04/06/15  Yes Epifanio Lesches, MD  lactulose (CHRONULAC) 10 GM/15ML solution Take 45 mLs (30 g total) by mouth daily as needed for mild constipation. Patient taking differently: Take 30 g by mouth daily.  04/06/15  Yes Epifanio Lesches, MD  lovastatin (MEVACOR) 20 MG tablet Take 20 mg by mouth at bedtime.   Yes Historical Provider, MD  meloxicam (MOBIC) 7.5 MG tablet Take 1 tablet by mouth daily. 03/01/15  Yes Historical Provider, MD  omeprazole (PRILOSEC) 20 MG capsule Take 20 mg by mouth 2 (two) times daily.   Yes Historical Provider, MD  ondansetron (ZOFRAN) 4 MG tablet Take 4 mg by mouth every 8 (eight) hours as needed for nausea or vomiting. Reported on 04/10/2015   Yes Historical Provider, MD  polyethylene glycol powder (GLYCOLAX/MIRALAX) powder Take 17 g by mouth daily as needed for mild constipation. Reported on 04/10/2015 01/10/15  Yes Historical Provider, MD  predniSONE (DELTASONE) 10 MG tablet Take 10 mg by mouth daily with breakfast. Reported on 04/25/2015   Yes Historical Provider, MD  Sennosides 8.6 MG CAPS Take 4 capsules by mouth daily.    Yes Historical Provider, MD  torsemide (DEMADEX) 20 MG tablet Take 20 mg by mouth once.    Yes Historical Provider, MD  cephALEXin (KEFLEX) 500 MG capsule Take 1 capsule (500 mg total) by mouth 3 (three) times daily. Patient not taking: Reported on 04/29/2015 04/10/15   Cammie Sickle, MD  metroNIDAZOLE (FLAGYL) 500 MG tablet Take 1 tablet (500 mg total) by mouth 3 (three) times daily. Patient not taking: Reported on 05/15/2015 04/10/15   Cammie Sickle, MD    Physical Exam: Danley Danker Vitals:  04/28/2015 1240 05/16/2015 1245 05/08/2015 1550 04/24/2015 1637  BP: 178/73 184/65 148/72 144/64  Pulse: 111 111 123 120  Temp:   100.4 F (38 C) 103 F (39.4 C)  TempSrc:   Oral Oral  Resp: 24 26 25 22   Height:      Weight:      SpO2: 94% 95% 93% 94%    Physical Exam  Constitutional: Appears ill, somnolent but easy to awake,  HENT: Normocephalic. External right and left ear normal. Dry MM Eyes: Conjunctivae and EOM are normal. PERRLA, no scleral icterus.  Neck: Normal ROM. Neck supple. No JVD. No tracheal deviation. No thyromegaly.  CVS: Regular rhythm, tachycardic, no gallops, no carotid bruit.  Pulmonary: Effort and breath sounds normal, no stridor, diminished breath sounds at bases  Abdominal: Soft. BS +,  no distension, tenderness in epigastric area, no rebound or guarding.  Musculoskeletal: Normal range of motion.  Lymphadenopathy: No lymphadenopathy noted, cervical, inguinal. Neuro: somnolent but easy to arouse, moving all 4 extremities spont Skin: Skin is warm and dry. No rash noted. Not diaphoretic. No erythema. No pallor.  Psychiatric:   Labs on Admission:  Basic Metabolic Panel:  Recent Labs Lab 04/25/15 1001 05/07/2015 1019 05/16/2015 1716  NA 136 146* 143  K 4.5 4.2 3.8  CL 101 112* 109  CO2 29 24 24   GLUCOSE 350* 86 140*  BUN 24* 19 19  CREATININE 1.61* 1.17 1.37*  CALCIUM 8.4* 8.7* 8.3*   Liver Function Tests:  Recent Labs Lab 04/25/15 1001 05/11/2015 1019  AST 26 71*  ALT 24 34  ALKPHOS 248* 421*  BILITOT 0.4 1.2  PROT 6.9 6.3*  ALBUMIN 2.8* 2.5*   CBC:  Recent Labs Lab 04/25/15 1001 05/01/2015 1019 05/23/2015 1716  WBC 7.0 7.6 9.9  NEUTROABS 5.1 5.8 8.6*  HGB 11.6* 10.9* 10.7*  HCT 36.1* 35.7* 35.5*  MCV 85.3 89.5 89.0   PLT 176 179 153   EKG: pending   If 7PM-7AM, please contact night-coverage [email or web address] Password [facility] 04/29/2015, 6:37 PM

## 2015-05-01 ENCOUNTER — Encounter (HOSPITAL_COMMUNITY): Admission: EM | Disposition: E | Payer: Self-pay | Source: Home / Self Care | Attending: Internal Medicine

## 2015-05-01 ENCOUNTER — Encounter (HOSPITAL_COMMUNITY): Payer: Self-pay | Admitting: Anesthesiology

## 2015-05-01 ENCOUNTER — Inpatient Hospital Stay (HOSPITAL_COMMUNITY): Payer: Commercial Managed Care - HMO | Admitting: Registered Nurse

## 2015-05-01 ENCOUNTER — Encounter (HOSPITAL_COMMUNITY): Payer: Self-pay | Admitting: Registered Nurse

## 2015-05-01 ENCOUNTER — Inpatient Hospital Stay (HOSPITAL_COMMUNITY): Payer: Commercial Managed Care - HMO

## 2015-05-01 ENCOUNTER — Ambulatory Visit (HOSPITAL_COMMUNITY): Admission: AD | Admit: 2015-05-01 | Payer: Self-pay | Source: Ambulatory Visit | Admitting: Gastroenterology

## 2015-05-01 ENCOUNTER — Ambulatory Visit: Payer: Commercial Managed Care - HMO

## 2015-05-01 DIAGNOSIS — N179 Acute kidney failure, unspecified: Secondary | ICD-10-CM

## 2015-05-01 DIAGNOSIS — R6520 Severe sepsis without septic shock: Secondary | ICD-10-CM

## 2015-05-01 DIAGNOSIS — C250 Malignant neoplasm of head of pancreas: Secondary | ICD-10-CM

## 2015-05-01 LAB — COMPREHENSIVE METABOLIC PANEL
ALK PHOS: 414 U/L — AB (ref 38–126)
ALT: 51 U/L (ref 17–63)
AST: 103 U/L — ABNORMAL HIGH (ref 15–41)
Albumin: 2 g/dL — ABNORMAL LOW (ref 3.5–5.0)
Anion gap: 13 (ref 5–15)
BUN: 23 mg/dL — ABNORMAL HIGH (ref 6–20)
CALCIUM: 7.7 mg/dL — AB (ref 8.9–10.3)
CO2: 19 mmol/L — ABNORMAL LOW (ref 22–32)
CREATININE: 1.67 mg/dL — AB (ref 0.61–1.24)
Chloride: 113 mmol/L — ABNORMAL HIGH (ref 101–111)
GFR, EST AFRICAN AMERICAN: 45 mL/min — AB (ref >60)
GFR, EST NON AFRICAN AMERICAN: 39 mL/min — AB (ref >60)
Glucose, Bld: 241 mg/dL — ABNORMAL HIGH (ref 65–99)
Potassium: 4.2 mmol/L (ref 3.5–5.1)
Sodium: 145 mmol/L (ref 135–145)
Total Bilirubin: 3.7 mg/dL — ABNORMAL HIGH (ref 0.3–1.2)
Total Protein: 5.6 g/dL — ABNORMAL LOW (ref 6.5–8.1)

## 2015-05-01 LAB — LACTIC ACID, PLASMA
LACTIC ACID, VENOUS: 6.5 mmol/L — AB (ref 0.5–2.0)
Lactic Acid, Venous: 5.7 mmol/L (ref 0.5–2.0)
Lactic Acid, Venous: 6.2 mmol/L (ref 0.5–2.0)

## 2015-05-01 LAB — URINE CULTURE: Culture: 1000

## 2015-05-01 LAB — GLUCOSE, CAPILLARY
GLUCOSE-CAPILLARY: 215 mg/dL — AB (ref 65–99)
GLUCOSE-CAPILLARY: 224 mg/dL — AB (ref 65–99)
GLUCOSE-CAPILLARY: 160 mg/dL — AB (ref 65–99)
Glucose-Capillary: 221 mg/dL — ABNORMAL HIGH (ref 65–99)
Glucose-Capillary: 145 mg/dL — ABNORMAL HIGH (ref 65–99)

## 2015-05-01 LAB — CBC
HCT: 33 % — ABNORMAL LOW (ref 39.0–52.0)
Hemoglobin: 10 g/dL — ABNORMAL LOW (ref 13.0–17.0)
MCH: 27.3 pg (ref 26.0–34.0)
MCHC: 30.3 g/dL (ref 30.0–36.0)
MCV: 90.2 fL (ref 78.0–100.0)
PLATELETS: 138 10*3/uL — AB (ref 150–400)
RBC: 3.66 MIL/uL — ABNORMAL LOW (ref 4.22–5.81)
RDW: 16.2 % — AB (ref 11.5–15.5)
WBC: 12.3 10*3/uL — AB (ref 4.0–10.5)

## 2015-05-01 SURGERY — ERCP, WITH INTERVENTION IF INDICATED
Anesthesia: General

## 2015-05-01 MED ORDER — ESMOLOL HCL 100 MG/10ML IV SOLN
INTRAVENOUS | Status: DC | PRN
  Administered 2015-05-01: 25 mg via INTRAVENOUS

## 2015-05-01 MED ORDER — MORPHINE SULFATE (PF) 2 MG/ML IV SOLN
2.0000 mg | Freq: Once | INTRAVENOUS | Status: AC
  Administered 2015-05-01: 2 mg via INTRAVENOUS
  Filled 2015-05-01: qty 1

## 2015-05-01 MED ORDER — LIDOCAINE HCL (CARDIAC) 20 MG/ML IV SOLN
INTRAVENOUS | Status: AC
  Filled 2015-05-01: qty 5

## 2015-05-01 MED ORDER — FENTANYL CITRATE (PF) 100 MCG/2ML IJ SOLN
INTRAMUSCULAR | Status: AC
  Filled 2015-05-01: qty 2

## 2015-05-01 MED ORDER — ETOMIDATE 2 MG/ML IV SOLN
INTRAVENOUS | Status: DC | PRN
  Administered 2015-05-01: 8 mg via INTRAVENOUS

## 2015-05-01 MED ORDER — FENTANYL CITRATE (PF) 100 MCG/2ML IJ SOLN
INTRAMUSCULAR | Status: DC | PRN
  Administered 2015-05-01 (×2): 50 ug via INTRAVENOUS
  Administered 2015-05-01 (×2): 25 ug via INTRAVENOUS
  Administered 2015-05-01: 50 ug via INTRAVENOUS

## 2015-05-01 MED ORDER — SODIUM CHLORIDE 0.9 % IV SOLN: INTRAVENOUS | Status: DC

## 2015-05-01 MED ORDER — SODIUM CHLORIDE 0.9 % IV BOLUS (SEPSIS)
500.0000 mL | Freq: Once | INTRAVENOUS | Status: AC
  Administered 2015-05-01: 500 mL via INTRAVENOUS

## 2015-05-01 MED ORDER — LABETALOL HCL 5 MG/ML IV SOLN
INTRAVENOUS | Status: AC
  Filled 2015-05-01: qty 4

## 2015-05-01 MED ORDER — SODIUM CHLORIDE 0.9 % IV BOLUS (SEPSIS): 250.0000 mL | INTRAVENOUS | Status: DC | PRN

## 2015-05-01 MED ORDER — PRO-STAT SUGAR FREE PO LIQD
30.0000 mL | Freq: Two times a day (BID) | ORAL | Status: DC
  Administered 2015-05-01: 30 mL via ORAL
  Filled 2015-05-01: qty 30

## 2015-05-01 MED ORDER — ACETAMINOPHEN 10 MG/ML IV SOLN
INTRAVENOUS | Status: AC
  Filled 2015-05-01: qty 100

## 2015-05-01 MED ORDER — MIDAZOLAM HCL 2 MG/2ML IJ SOLN
INTRAMUSCULAR | Status: AC
  Filled 2015-05-01: qty 2

## 2015-05-01 MED ORDER — SUCCINYLCHOLINE CHLORIDE 20 MG/ML IJ SOLN
INTRAMUSCULAR | Status: DC | PRN
  Administered 2015-05-01: 100 mg via INTRAVENOUS

## 2015-05-01 MED ORDER — ENSURE ENLIVE PO LIQD: 237.0000 mL | ORAL | Status: DC

## 2015-05-01 MED ORDER — PROPOFOL 10 MG/ML IV BOLUS
INTRAVENOUS | Status: AC
  Filled 2015-05-01: qty 20

## 2015-05-01 MED ORDER — FENTANYL CITRATE (PF) 100 MCG/2ML IJ SOLN: 25.0000 ug | INTRAMUSCULAR | Status: DC | PRN

## 2015-05-01 MED ORDER — LACTATED RINGERS IV SOLN
INTRAVENOUS | Status: DC | PRN
  Administered 2015-05-01: 18:00:00 via INTRAVENOUS

## 2015-05-01 MED ORDER — PROPOFOL 10 MG/ML IV BOLUS
INTRAVENOUS | Status: DC | PRN
  Administered 2015-05-01: 20 mg via INTRAVENOUS

## 2015-05-01 MED ORDER — PROMETHAZINE HCL 25 MG/ML IJ SOLN: 6.2500 mg | INTRAMUSCULAR | Status: DC | PRN

## 2015-05-01 MED ORDER — MEPERIDINE HCL 50 MG/ML IJ SOLN: 6.2500 mg | INTRAMUSCULAR | Status: DC | PRN

## 2015-05-01 MED ORDER — ONDANSETRON HCL 4 MG/2ML IJ SOLN
INTRAMUSCULAR | Status: DC | PRN
  Administered 2015-05-01: 4 mg via INTRAVENOUS

## 2015-05-01 MED ORDER — PHENYLEPHRINE HCL 10 MG/ML IJ SOLN
INTRAMUSCULAR | Status: AC
  Filled 2015-05-01: qty 1

## 2015-05-01 NOTE — Anesthesia Postprocedure Evaluation (Signed)
Anesthesia Post Note  Patient: [name]  Procedure(s) Performed: Procedure(s) (LRB): ENDOSCOPIC RETROGRADE CHOLANGIOPANCREATOGRAPHY (ERCP) (N/A)  Patient location during evaluation: PACU Anesthesia Type: General Level of consciousness: awake and alert and confused Pain management: pain level controlled Vital Signs Assessment: post-procedure vital signs reviewed and stable Respiratory status: spontaneous breathing, nonlabored ventilation, respiratory function stable and patient connected to nasal cannula oxygen Cardiovascular status: blood pressure returned to baseline, stable and tachycardic Postop Assessment: no signs of nausea or vomiting Anesthetic complications: no Comments: Pt is septic. Remains tachy and mildlyhypotensive, as pre-op. Given small dose of beta blocker in PACU. Transfer to ICU    Last Vitals:  Filed Vitals:   05/11/2015 1700 05/09/2015 1952  BP:  113/61  Pulse:  132  Temp: 37.4 C 37.3 C  Resp:  21    Last Pain:  Filed Vitals:   05/01/2015 1957  PainSc: Asleep                 Montez Hageman

## 2015-05-01 NOTE — Anesthesia Preprocedure Evaluation (Addendum)
Anesthesia Evaluation  Patient identified by MRN, date of birth, ID band Patient awake and Patient confused    Reviewed: Allergy & Precautions, NPO status , Patient's Chart, lab work & pertinent test results  Airway Mallampati: II  TM Distance: >3 FB Neck ROM: Full    Dental no notable dental hx. (+) Edentulous Upper, Edentulous Lower   Pulmonary sleep apnea , former smoker,    Pulmonary exam normal breath sounds clear to auscultation       Cardiovascular + angina + CAD, + Past MI, + Cardiac Stents, + CABG (2013) and +CHF  Normal cardiovascular exam Rhythm:Regular Rate:Normal     Neuro/Psych negative neurological ROS  negative psych ROS   GI/Hepatic negative GI ROS, Neg liver ROS, Pancreatic cancer   Endo/Other  diabetes, Poorly Controlled, Type 2, Insulin Dependent  Renal/GU negative Renal ROS  negative genitourinary   Musculoskeletal negative musculoskeletal ROS (+)   Abdominal   Peds negative pediatric ROS (+)  Hematology negative hematology ROS (+)   Anesthesia Other Findings   Reproductive/Obstetrics negative OB ROS                           Anesthesia Physical Anesthesia Plan  ASA: IV  Anesthesia Plan: General   Post-op Pain Management:    Induction: Intravenous  Airway Management Planned: Oral ETT  Additional Equipment:   Intra-op Plan:   Post-operative Plan: Extubation in OR and Possible Post-op intubation/ventilation  Informed Consent: I have reviewed the patients History and Physical, chart, labs and discussed the procedure including the risks, benefits and alternatives for the proposed anesthesia with the patient or authorized representative who has indicated his/her understanding and acceptance.   Dental advisory given  Plan Discussed with: CRNA  Anesthesia Plan Comments: (High risk for cardiopulmonary complications. ASA 4. Septic. Confused)        Anesthesia Quick Evaluation

## 2015-05-01 NOTE — Progress Notes (Signed)
[facility] PROGRESS NOTE  [name] [id number] DOB: [birth year] DOA: 05/08/2015 PCP: Rusty Aus., MD  Assessment/Plan: 75 y/o male with PMH of HTN, DM, CAD h/o CABG, CHF, Recent Dx of Pancreatic CA, complicated with recurrent infections, bacteremia, biliary sepsis, neumonia, underwent ERCP in 02/13/2015 and had stenting of CBD presented with progressive weakness, lethargy  -admitted with recurrent sepsis, suspected biliary  Source.   1. Severe sepsis. Suspected biliary source. R/o bacteremia.  -Pt is still tachycardic, febrile, lactic acid is tending up. Borderline soft BP. We will cont IVF resuscitation as needed. Cont IV atx, pend cultures. Cont monitor in SDU. May need pressure support   2. Pancreatic adenocarcinoma stage IIB- T3 N0. possible invasion of the mesenteric vein on EUS.  -CT abd (1/8): Progressive dilatation of the common bile duct and pancreatic duct status post CBD stenting. Pneumobilia is identified compatiblewith common bile duct patency. Pancreatic head mass is not significantly changed in size from previous exam. -admitting MD d/w Dr. Oletta Lamas who will see the patient. Needs eval for stent 3. AKI. Likely ATN due to sepsis. Cont IVF monitor urine output. Renal function  4. CAD h/o CABG. No active chest pains. Cont ASA. Pt is not on BB. Will consider when sepsis resolves  5. CHF. Chronic. Holding diuretics with sepsis. Cont monitor resume diuresis as needed 6. DM. ha1c 7.5 (02/2015). Home regimen lantus 40U+ISS. decreased lantus due to poor oral intake. titrate as needed  Prognosis is guarded due to pancreatic cancer related complications, recurrent infection, sepsis. D/w patient. No family at the bedside. We will d/w family again to address [place]  Code Status: Full Family Communication: d/w patient, RN (indicate person spoken with, relationship, and if by phone, the number) Disposition Plan: pend clinical improvement     Consultants:  PCCm  GI  Procedures: Recent imaging studies:  ERCP 02/13/2015  - segmental biliary stricture was found, entire main bile duct was dilated.  - sphincterotomy was performed, o ne plastic stent was placed into the common bile duct.  - recommendation was to repeat ERCP in 3 months to exchange stent  EGD/EUS 11/03  - Normal esophagus, Normal stomach.  - Biliary stent emerging from the major ampulla.  - Otherwise normal examined duodenum.  - mass identified in the pancreatic head, ? adenoca, staged T3 N0 Mx by endosonographic criteria.  - cystic lesion was seen in the pancreatic body.  - main pancreatic duct had a dilated endosonographic appearance with prominent side branches  - One stent was visualized endosonographically in the common bile duct.   Antibiotics:  Cefepime 1/8>>>  vanc 1/8>>> (indicate start date, and stop date if known)  HPI/Subjective: Alert, confused to time. Patient is tachycardic. Denies acute chest pains   Objective: Filed Vitals:   05/07/2015 0500 05/16/2015 0600  BP: 122/44 104/44  Pulse: 118 116  Temp:  102 F (38.9 C)  Resp: 21 25    Intake/Output Summary (Last 24 hours) at 04/26/2015 0811 Last data filed at 05/21/2015 0600  Gross per 24 hour  Intake 2933.33 ml  Output    201 ml  Net 2732.33 ml   Filed Weights   04/24/2015 0953 05/01/15 0600  Weight: 82.6 kg (182 lb 1.6 oz) 83.6 kg (184 lb 4.9 oz)    Exam:   General:  Alert, confused to time   Cardiovascular: s1,s2 tachycardia   Respiratory: few rales LL  Abdomen: soft, epigastric mild tender, no rebound   Musculoskeletal:  Mild pedal edema    Data  Reviewed: Basic Metabolic Panel:  Recent Labs Lab 04/25/15 1001 04/24/2015 1019 04/24/2015 1716 05/12/2015 0500  NA 136 146* 143 145  K 4.5 4.2 3.8 4.2  CL 101 112* 109 113*  CO2 29 24 24  19*  GLUCOSE 350* 86 140* 241*  BUN 24* 19 19 23*  CREATININE 1.61* 1.17 1.37* 1.67*  CALCIUM 8.4* 8.7* 8.3* 7.7*   Liver  Function Tests:  Recent Labs Lab 04/25/15 1001 04/24/2015 1019 05/23/2015 1716 04/28/2015 0500  AST 26 71* 99* 103*  ALT 24 34 47 51  ALKPHOS 248* 421* 479* 414*  BILITOT 0.4 1.2 2.6* 3.7*  PROT 6.9 6.3* 6.0* 5.6*  ALBUMIN 2.8* 2.5* 2.3* 2.0*   No results for input(s): LIPASE, AMYLASE in the last 168 hours. No results for input(s): AMMONIA in the last 168 hours. CBC:  Recent Labs Lab 04/25/15 1001 05/13/2015 1019 05/06/2015 1716 05/01/15 0652  WBC 7.0 7.6 9.9 12.3*  NEUTROABS 5.1 5.8 8.6*  --   HGB 11.6* 10.9* 10.7* 10.0*  HCT 36.1* 35.7* 35.5* 33.0*  MCV 85.3 89.5 89.0 90.2  PLT 176 179 153 138*   Cardiac Enzymes: No results for input(s): CKTOTAL, CKMB, CKMBINDEX, TROPONINI in the last 168 hours. BNP (last 3 results)  Recent Labs  10/25/14 2326  BNP 53.0    ProBNP (last 3 results) No results for input(s): PROBNP in the last 8760 hours.  CBG:  Recent Labs Lab 05/01/15 0739  GLUCAP 215*    Recent Results (from the past 240 hour(s))  MRSA PCR Screening     Status: None   Collection Time: 05/04/2015  4:43 PM  Result Value Ref Range Status   MRSA by PCR NEGATIVE NEGATIVE Final    Comment:        The GeneXpert MRSA Assay (FDA approved for NASAL specimens only), is one component of a comprehensive MRSA colonization surveillance program. It is not intended to diagnose MRSA infection nor to guide or monitor treatment for MRSA infections.      Studies: Ct Abdomen Pelvis W Contrast  04/28/2015  CLINICAL DATA:  Pancreas cancer.  Generalize weakness. EXAM: CT ABDOMEN AND PELVIS WITH CONTRAST TECHNIQUE: Multidetector CT imaging of the abdomen and pelvis was performed using the standard protocol following bolus administration of intravenous contrast. CONTRAST:  33mL OMNIPAQUE IOHEXOL 300 MG/ML SOLN, 148mL OMNIPAQUE IOHEXOL 300 MG/ML SOLN COMPARISON:  03/29/2015 FINDINGS: Lower chest: Small pleural effusions noted, left greater than right. Hepatobiliary: No focal liver  abnormality identified. There is a common bile duct stent in place. Progressive dilatation of the common bile duct is noted which now measures 1.2 cm. Pneumobilia however is present confirming biliary patency. Pneumobilia is identified compatible with biliary patency. Pancreas: Progressive dilatation of the pancreatic duct. Ste the mass within the head of pancreas measures 2.1 cm, image 34 of series 2. This is compared with 1.9 cm previously. Spleen: Normal appearance of the spleen. Adrenals/Urinary Tract: The adrenal glands are both normal. The left kidney is unremarkable. Cyst within the inferior pole of the right kidney measures 1.4 cm. No obstructive uropathy. The urinary bladder appears normal. Stomach/Bowel: Small hiatal hernia. The stomach and small bowel loops are otherwise unremarkable. No pathologic dilatation of the large or small bowel loops identified. Vascular/Lymphatic: Calcified atherosclerotic disease involves the abdominal aorta. No aneurysm. No enlarged retroperitoneal or mesenteric adenopathy. No enlarged pelvic or inguinal lymph nodes. Portacaval lymph node measures 1.4 cm, image 25 of series 2. This is compared with 1.3 cm previously. The index pre caval  lymph node measures 1 cm, image 31 of series 2. Unchanged from previous exam. No enlarged pelvic lymph nodes. Reproductive: The prostate gland and seminal vesicles are unremarkable. Other: There is no ascites or focal fluid collections within the abdomen or pelvis. Musculoskeletal: There is an acute right lateral eighth rib fracture identified, image number 2 of series 2. No aggressive lytic or sclerotic bone lesions identified. IMPRESSION: 1. Progressive dilatation of the common bile duct and pancreatic duct status post CBD stenting. Pneumobilia is identified compatible with common bile duct patency. Pancreatic head mass is not significantly changed in size from previous exam. 2. There is an acute right eighth rib fracture identified. 3.  Aortic atherosclerosis 4. No evidence for bowel obstruction or abscess formation. Electronically Signed   By: Kerby Moors M.D.   On: 05/07/2015 13:52   Dg Chest Port 1 View  05/03/2015  CLINICAL DATA:  75 year old male with fever EXAM: PORTABLE CHEST 1 VIEW COMPARISON:  Prior chest x-ray 04/14/2015 FINDINGS: Cardiac and mediastinal contours remain unchanged. Patient is status post median sternotomy with evidence of prior multivessel CABG. Healing right-sided rib fractures. No new focal consolidation, pneumonia or pleural effusion. Mild pulmonary vascular congestion without overt edema. Stable bronchitic change. No acute osseous abnormality. IMPRESSION: 1. No acute cardiopulmonary process. 2. Healing right-sided rib fractures. No pneumothorax or pleural effusion. 3. Aortic atherosclerosis. Electronically Signed   By: Jacqulynn Cadet M.D.   On: 05/05/2015 10:57    Scheduled Meds: . aspirin EC  81 mg Oral Daily  . ceFEPime (MAXIPIME) IV  1 g Intravenous Q12H  . enoxaparin (LOVENOX) injection  40 mg Subcutaneous Q24H  . fentaNYL  25 mcg Transdermal Q72H  . insulin aspart  0-9 Units Subcutaneous TID WC  . insulin glargine  5 Units Subcutaneous BID  . pantoprazole  40 mg Oral Daily  . senna  4 tablet Oral Daily  . sodium chloride  3 mL Intravenous Q12H  . vancomycin  1,000 mg Intravenous Q24H   Continuous Infusions: . sodium chloride 125 mL/hr (05/12/2015 2023)    Principal Problem:   Sepsis ([place]) Active Problems:   Pancreatic cancer ([place])   Weakness   Acute kidney injury ([facility])   Hypernatremia   Anemia of chronic disease   DM (diabetes mellitus) type II controlled, neurological manifestation ([place])    Time spent: >35 minutes     Kinnie Feil  [facility] Pager [phone]. If 7PM-7AM, please contact night-coverage at [email or web address], password [facility] 04/28/2015, 8:11 AM  LOS: 1 day

## 2015-05-01 NOTE — Op Note (Signed)
[facility] [place], [address]   ERCP PROCEDURE REPORT        EXAM DATE: May 25, 2015  PATIENT NAME:          [name], [name]          MR #: [id number] BIRTHDATE:       [birth year]     VISIT #:     [id number] ATTENDING:     Teena Irani, MD     STATUS:     inpatient ASSISTANT:      Sharon Mt and [place]ppard Plumber, Autumn   INDICATIONS:  The patient is a 75 yr old male here for an ERCP due to ancreatic cancer and cholangitis PROCEDURE PERFORMED:     ERCP with stent removal MEDICATIONS:     general anesthesia  CONSENT: The patient understands the risks and benefits of the procedure and understands that these risks include, but are not limited to: sedation, allergic reaction, infection, perforation and/or bleeding. Alternative means of evaluation and treatment include, among others: physical exam, x-rays, and/or surgical intervention. The patient elects to proceed with this endoscopic procedure.  DESCRIPTION OF PROCEDURE: During intra-op preparation period all mechanical & medical equipment was checked for proper function. Hand hygiene and appropriate measures for infection prevention was taken. After the risks, benefits and alternatives of the procedure were thoroughly explained, Informed was verified, confirmed and timeout was successfully executed by the treatment team. With the patient in left semi-prone position, medications were administered intravenously.The Pentax side-viewing endoscope      was passed from the mouth into the esophagus and further advanced from the esophagus into the stomach. From stomach scope was directed to the papilla of Vater.     .  Major papilla was aligned with the duodenoscope. The scope position was confirmed fluoroscopically. Rest of the findings/therapeutics are given below. The scope was then completely withdrawn from the patient and the procedure completed. The pulse, BP, and O2 saturation were monitored  and documented by the physician and the nursing staff throughout the entire procedure. The patient was cared for as planned according to standard protocol. The patient was then discharged to recovery in stable condition and with appropriate post procedure care. Estimated blood loss is zero unless otherwise noted in this procedure report.  xisting 9cm 10 French stent had migrated significantly distally. It was removed with a snare loop. After removal a large amount of viscous pus was seen to exit from the papilla. This continued as the papilla was cannulated with a sphincterotome and a cholangiogram obtained. This showed 2-3 senna meter distalstricture with no intraluminal filling defects and dilated intrahepatic and next hepatic ducts above the stricture. A covered 6 cm 10 mm metallic stent was then deployed into the common bile duct This resulted in further expulsion of bile. Approximate 40 mL of dye and water were used to further flush. the purulent  liquid and toward the end of this mainly Clear water was seen to exit from the stent which appeared to be in good position.    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 We'll check labs tomorrow. REPEAT EXAM:   ___________________________________ Teena Irani, MD eSigned:  Teena Irani, MD May 25, 2015 8:02 PM   cc:  CPT CODES: ICD9 CODES:  The  ICD and CPT codes recommended by this software are interpretations from the data that the clinical staff has captured with the software.  The verification of the translation of this report to the ICD and CPT codes and modifiers is the sole responsibility of the health care institution and practicing physician where this report was generated.  [place]. will not be held responsible for the validity of the ICD and CPT codes included on this report.  AMA assumes no liability for data contained or not contained herein. CPT is a Designer, television/film set of the [employer].   PATIENT NAME:  [name], [name] MR#: [id number]

## 2015-05-01 NOTE — Progress Notes (Signed)
Inpatient Diabetes Program Recommendations  AACE/ADA: New Consensus Statement on Inpatient Glycemic Control (2015)  Target Ranges:  Prepandial:   less than 140 mg/dL      Peak postprandial:   less than 180 mg/dL (1-2 hours)      Critically ill patients:  140 - 180 mg/dL   Results for RIKO, MUFFLER (MRN [id number]) as of 05/16/2015 13:47  Ref. Range 04/24/2015 07:39 05/21/2015 12:33  Glucose-Capillary Latest Ref Range: 65-99 mg/dL 215 (H) 224 (H)   Review of Glycemic Control  Diabetes history: DM 2 Outpatient Diabetes medications: Lantus 40 units BID, Novolog 2-10 units TID Current orders for Inpatient glycemic control: Lantus 5 units BID, Novolog Sensitive TID.  Inpatient Diabetes Program Recommendations: Insulin - Basal: Glucose so far today, 215/224 mg/dl. Patient takes Lantus 40 BID at home, please consider titrating basal insulin to Lantus 8 - 10 units BID.  Thanks,  Tama Headings RN, MSN, [facility] Inpatient Diabetes Coordinator Team Pager [phone] (8a-5p)

## 2015-05-01 NOTE — Transfer of Care (Signed)
Immediate Anesthesia Transfer of Care Note  Patient: [name]  Procedure(s) Performed: Procedure(s): ENDOSCOPIC RETROGRADE CHOLANGIOPANCREATOGRAPHY (ERCP) (N/A)  Patient Location: PACU  Anesthesia Type:General  Level of Consciousness: awake, alert , confused and responds to stimulation  Airway & Oxygen Therapy: Patient Spontanous Breathing and Patient connected to face mask oxygen  Post-op Assessment: Report given to RN, Post -op Vital signs reviewed and stable and Patient moving all extremities X 4  Post vital signs: stable  Last Vitals:  Filed Vitals:   05/17/2015 1700 05/18/2015 1952  BP:  113/61  Pulse:  132  Temp: 37.4 C 37.3 C  Resp:  21    Complications: No apparent anesthesia complications

## 2015-05-01 NOTE — Progress Notes (Signed)
CRITICAL VALUE ALERT  Critical value received:  Lactic acid 5.8  Date of notification:  04/27/2015  Time of notification:  [id number]  Critical value read back:Yes.    Nurse who received alert:  mk  MD notified (1st page):  schoor  Time of first page:  [phone]  MD notified (2nd page):schoor  Time of second [id number]  Responding MD:  schoor  Time MD responded:  [phone]

## 2015-05-01 NOTE — Progress Notes (Signed)
Initial Nutrition Assessment  DOCUMENTATION CODES:   Not applicable  INTERVENTION:  - Will order Ensure Enlive once/day, this supplement provides 350 kcal and 20 grams of protein - Will order 30 mL Prostat BID, each supplement provides 100 kcal and 15 grams of protein. - Encourage PO intakes of meals and supplements - RD will continue to monitor for needs  NUTRITION DIAGNOSIS:   Increased nutrient needs related to catabolic illness, cancer and cancer related treatments, wound healing as evidenced by estimated needs.  GOAL:   Patient will meet greater than or equal to 90% of their needs  MONITOR:   PO intake, Supplement acceptance, Weight trends, Labs, Skin, I & O's  REASON FOR ASSESSMENT:   Low Braden  ASSESSMENT:   75 yo male with known pancreatic cancer, underwent ERCP in 02/13/2015 and had stenting of CBD, sees oncologist at [facility], presented to [facility] ED for evaluation of progressive lethargy and weakness. Please note that pt is too somnolent to provide any history at the time of the admission and daughter who is POA is able to provide some details. She explains that pt has been recently hospitalized for sepsis, Klebsiella pneumonia and bacteremia (November 2016), again admitted December 2016 for sepsis and at that time biliary source was thought to be the culprit (blood cultures were negative at that time). Pt has completed course of ABX on December 28th, 2016 and daughter says pt has continued to decline, minimal oral intake, has been mostly bed bound, fevers as high as 103 F. No reported chest pain or shortness of breath, no reported abd or urinary concerns.   Pt seen for low Braden score. BMI indicates overweight status. No intakes documented and pt denies eating breakfast yet this AM. He denies abdominal pain or nausea at this time. Pt states PTA he had a very good appetite and was not experiencing taste alterations while undergoing chemotherapy for pancreatic cancer.   Pt  drowsy at time of RD visit so further questioning did not occur at this time. Pt does state that he believes UBW was 145 lbs. This is not consistent with weight hx in the chart. Per chart review, pt has lost 17 lbs (9% body weight) in the past 2.5 months which is significant for time frame. No muscle or fat wasting noted at this time. Will continue to monitor intakes and weight trends but unable to confirm malnutrition at this time.   Increased needs and supplements ordered as outlined above; will adjust as needed at follow-up. Medications reviewed. Labs reviewed; Cl: 113 mmol/L, BUN/creatinine elevated, Ca: 7.7 mg/dL, LFTs elevated, GFR: 39.   Diet Order:  Diet NPO time specified  Skin:  Wound (see comment) (Stage 2 sacral pressure ulcer)  Last BM:  PTA  Height:   Ht Readings from Last 1 Encounters:  05/21/2015 5\' 8"  (1.727 m)    Weight:   Wt Readings from Last 1 Encounters:  05/18/2015 184 lb 4.9 oz (83.6 kg)    Ideal Body Weight:  70 kg (kg)  BMI:  Body mass index is 28.03 kg/(m^2).  Estimated Nutritional Needs:   Kcal:  2400-2600  Protein:  95-110 grams  Fluid:  >/= 2 L/day  EDUCATION NEEDS:   No education needs identified at this time     Jarome Matin, RD, LDN Inpatient Clinical Dietitian Pager # [phone] After hours/weekend pager # [phone]

## 2015-05-01 NOTE — Progress Notes (Signed)
Pt has not voided so far into shift. Bladder scan performed. 295 ml noted. MD made aware. Order received to insert foley catheter. Vwilliams,rn.

## 2015-05-01 NOTE — Progress Notes (Signed)
ERCP done. Please see note for details. Copious amounts of frank pus within the bile duct with existing stent migrated distally. It was pulled and replaced with a 10 mm 6 cm coated metal stent with further copious drainage of pus. Approximate 40 mL of water and dye were used to lavage out of the duct and at the end of the procedure there is primarily [place] coming through the stent. Would continue antibiotics, pressure support with fluids as needed. Recheck labs in the morning.

## 2015-05-01 NOTE — Consult Note (Signed)
[facility] CONSULT Reason for consult: Sepsis Referring Physician: [facility] hospitalist. Dr. Emily Filbert PCP: Dr Cindee Lame is oncologist  [name] Deshpande is an 75 y.o. male.  HPI: he has known pancreatic cancer and underwent ERCP with stent placement 10/14 in [place]. He has had several admissions since then. They have been for infection and he's been treated with antibiotics. Last admission was 12/15 and he was septic. And was treated with antibiotics. We don't have access to all the records but he did have EUS with biopsy for pancreatic mass proved to be adenocarcinoma had stent placed by Dr.Wohl in [facility] 10/16. He has been treated with radiation and chemo but apparently has not responded. During his admission 12/15, he was found to have Klebsiella bacteremia was treated with 7 days of IV antibiotics. He did improve. He has had recent CT and MRI showing pneumobilia. His liver tests were normal and it was not felt that his stent was obstructed. He was discharged on antibiotics. He is somewhat confused at this point in time it is difficult to obtain a good history but for unclear reasons was brought to [facility] rather than going back to [facility]. He was found in the emergency room to be tachycardic and science of sepsis with temperature of 103. Repeat CT scan showed the stent to be in good position with pneumobilia in his initial bilirubin was normal. He apparently had been scheduled to have his stent changed next week by Dr. Corlis Hove has been admitted to the ICU and started on broad-spectrum antibiotics. Since his admission, this total bilirubin has increased from 1.2 to 3.7 and WBC is up to 12.2.   Past Medical History  Diagnosis Date  . Hyperlipidemia   . H/O cardiac catheterization 06/03/06,12/03/11  . BPH (benign prostatic hypertrophy)   . Anxiety   . Syncope     LAUGHING INDUCED  . Myocardial infarction ([place])     x 2  . CHF (congestive heart failure) ([place])   . CAD (coronary  artery disease) 06/03/06    cypher 2.5 x 72m DES for 80% mid RCA sees Dr. FUbaldo Glassing . Asthma     as a child  . Sleep apnea     hx of  . Kidney stone     hx of  . GERD (gastroesophageal reflux disease)     on medication for  . Cancer ([facility]     hx of skin ca in "corner of left eye"  . Arthritis   . Anemia     taking iron  . Cataract, bilateral   . Neuromuscular disorder ([place]     diabetic neuropathy  . Diabetes mellitus without complication ([place]   . Pancreatic adenocarcinoma ([facility]     Past Surgical History  Procedure Laterality Date  . Spine surgery  9/05    CERVICAL LAMINECTOMY  . Cervical herniated    . Eye surgery      as a child  . Coronary artery bypass graft  12/12/2011    Procedure: CORONARY ARTERY BYPASS GRAFTING (CABG);  Surgeon: BGaye Pollack MD;  [facility]  Service: Open Heart Surgery;  Laterality: N/A;  coronary artery bypass graft times four using left internal mammary artery, right leg greater saphenous vein harvested endoscopically  . Cardiac catheterization N/A 10/26/2014    Procedure: Left Heart Cath;  Surgeon: AIsaias Cowman MD;  [facility];  Service: Cardiovascular;  Laterality: N/A;  . Endoscopic retrograde cholangiopancreatography (ercp) with propofol N/A 02/13/2015  Procedure: ENDOSCOPIC RETROGRADE CHOLANGIOPANCREATOGRAPHY (ERCP) WITH PROPOFOL;  Surgeon: Lucilla Lame, MD;  Location: [facility] ENDOSCOPY;  Service: Endoscopy;  Laterality: N/A;  . Eus N/A 02/23/2015    Procedure: UPPER ENDOSCOPIC ULTRASOUND (EUS) LINEAR;  Surgeon: Holly Bodily, MD;  Location: [facility] ENDOSCOPY;  Service: Gastroenterology;  Laterality: N/A;    Family History  Problem Relation Age of Onset  . Diabetes Mellitus II Mother     Also Brother and sister  . Pancreatic cancer Mother   . Leukemia Father   . Diabetes Mellitus II Sister   . Diabetes Mellitus II Brother     Social History:  reports that he quit smoking about 37 years ago. His smoking use  included Cigarettes and Cigars. He has never used smokeless tobacco. He reports that he does not drink alcohol or use illicit drugs.  Allergies:  Allergies  Allergen Reactions  . Zithromax [Azithromycin] Anaphylaxis and Other (See Comments)    Reaction:  Stroke-like symptoms   . Lisinopril Other (See Comments)    Per spouse, pt did not tolerate well in the past.      Medications; Prior to Admission medications   Medication Sig Start Date End Date Taking? Authorizing Provider  acetaminophen (TYLENOL) 650 MG CR tablet Take 650 mg by mouth every 6 (six) hours as needed for pain.    Yes Historical Provider, MD  aspirin EC 81 MG tablet Take 81 mg by mouth daily.   Yes Historical Provider, MD  fentaNYL (DURAGESIC - DOSED MCG/HR) 25 MCG/HR patch Place 1 patch (25 mcg total) onto the skin every 3 (three) days. 04/18/15  Yes Noreene Filbert, MD  fluconazole (DIFLUCAN) 100 MG tablet Take 1 tablet (100 mg total) by mouth daily. 04/25/15  Yes Noreene Filbert, MD  gabapentin (NEURONTIN) 300 MG capsule Take 300 mg by mouth 2 (two) times daily.   Yes Historical Provider, MD  HYDROcodone-acetaminophen (NORCO/VICODIN) 5-325 MG tablet Take 2 tablets by mouth every 6 (six) hours as needed. For pain. Patient taking differently: Take 2 tablets by mouth every 6 (six) hours as needed for moderate pain or severe pain. For pain. 04/06/15  Yes Epifanio Lesches, MD  insulin aspart (NOVOLOG) 100 UNIT/ML injection Inject 5 Units into the skin 3 (three) times daily with meals. Patient taking differently: Inject 2-10 Units into the skin 3 (three) times daily with meals. Take 7 units with each meal along with sliding scale accordingly  151-200=2 units 201-250=4 units 251-300=6 units 301-351=8 units 400+=10 units and call MD 04/06/15  Yes Epifanio Lesches, MD  insulin glargine (LANTUS) 100 UNIT/ML injection Inject 0.4 mLs (40 Units total) into the skin 2 (two) times daily. 04/06/15  Yes Epifanio Lesches, MD   lactulose (CHRONULAC) 10 GM/15ML solution Take 45 mLs (30 g total) by mouth daily as needed for mild constipation. Patient taking differently: Take 30 g by mouth daily.  04/06/15  Yes Epifanio Lesches, MD  lovastatin (MEVACOR) 20 MG tablet Take 20 mg by mouth at bedtime.   Yes Historical Provider, MD  meloxicam (MOBIC) 7.5 MG tablet Take 1 tablet by mouth daily. 03/01/15  Yes Historical Provider, MD  omeprazole (PRILOSEC) 20 MG capsule Take 20 mg by mouth 2 (two) times daily.   Yes Historical Provider, MD  ondansetron (ZOFRAN) 4 MG tablet Take 4 mg by mouth every 8 (eight) hours as needed for nausea or vomiting. Reported on 04/10/2015   Yes Historical Provider, MD  polyethylene glycol powder (GLYCOLAX/MIRALAX) powder Take 17 g by mouth daily as needed for  mild constipation. Reported on 04/10/2015 01/10/15  Yes Historical Provider, MD  predniSONE (DELTASONE) 10 MG tablet Take 10 mg by mouth daily with breakfast. Reported on 04/25/2015   Yes Historical Provider, MD  Sennosides 8.6 MG CAPS Take 4 capsules by mouth daily.    Yes Historical Provider, MD  torsemide (DEMADEX) 20 MG tablet Take 20 mg by mouth once.    Yes Historical Provider, MD  cephALEXin (KEFLEX) 500 MG capsule Take 1 capsule (500 mg total) by mouth 3 (three) times daily. Patient not taking: Reported on 05/19/2015 04/10/15   Earna Coder, MD  metroNIDAZOLE (FLAGYL) 500 MG tablet Take 1 tablet (500 mg total) by mouth 3 (three) times daily. Patient not taking: Reported on 05/03/2015 04/10/15   Earna Coder, MD   . aspirin EC  81 mg Oral Daily  . ceFEPime (MAXIPIME) IV  1 g Intravenous Q12H  . enoxaparin (LOVENOX) injection  40 mg Subcutaneous Q24H  . fentaNYL  25 mcg Transdermal Q72H  . insulin aspart  0-9 Units Subcutaneous TID WC  . insulin glargine  5 Units Subcutaneous BID  . pantoprazole  40 mg Oral Daily  . senna  4 tablet Oral Daily  . sodium chloride  3 mL Intravenous Q12H  . vancomycin  1,000 mg Intravenous  Q24H   PRN Meds acetaminophen, HYDROcodone-acetaminophen, lactulose, ondansetron **OR** ondansetron (ZOFRAN) IV, polyethylene glycol Results for orders placed or performed during the hospital encounter of 05/20/2015 (from the past 48 hour(s))  CBC with Differential/Platelet     Status: Abnormal   Collection Time: 05/20/2015 10:19 AM  Result Value Ref Range   WBC 7.6 4.0 - 10.5 K/uL   RBC 3.99 (L) 4.22 - 5.81 MIL/uL   Hemoglobin 10.9 (L) 13.0 - 17.0 g/dL   HCT 01.5 (L) 61.5 - 37.9 %   MCV 89.5 78.0 - 100.0 fL   MCH 27.3 26.0 - 34.0 pg   MCHC 30.5 30.0 - 36.0 g/dL   RDW 43.2 76.1 - 47.0 %   Platelets 179 150 - 400 K/uL   Neutrophils Relative % 77 %   Neutro Abs 5.8 1.7 - 7.7 K/uL   Lymphocytes Relative 12 %   Lymphs Abs 0.9 0.7 - 4.0 K/uL   Monocytes Relative 8 %   Monocytes Absolute 0.6 0.1 - 1.0 K/uL   Eosinophils Relative 2 %   Eosinophils Absolute 0.2 0.0 - 0.7 K/uL   Basophils Relative 1 %   Basophils Absolute 0.0 0.0 - 0.1 K/uL  Comprehensive metabolic panel     Status: Abnormal   Collection Time: 04/25/2015 10:19 AM  Result Value Ref Range   Sodium 146 (H) 135 - 145 mmol/L   Potassium 4.2 3.5 - 5.1 mmol/L   Chloride 112 (H) 101 - 111 mmol/L   CO2 24 22 - 32 mmol/L   Glucose, Bld 86 65 - 99 mg/dL   BUN 19 6 - 20 mg/dL   Creatinine, Ser 9.29 0.61 - 1.24 mg/dL   Calcium 8.7 (L) 8.9 - 10.3 mg/dL   Total Protein 6.3 (L) 6.5 - 8.1 g/dL   Albumin 2.5 (L) 3.5 - 5.0 g/dL   AST 71 (H) 15 - 41 U/L   ALT 34 17 - 63 U/L   Alkaline Phosphatase 421 (H) 38 - 126 U/L   Total Bilirubin 1.2 0.3 - 1.2 mg/dL   GFR calc non Af Amer 60 (L) >60 mL/min   GFR calc Af Amer >60 >60 mL/min    Comment: (NOTE) The  eGFR has been calculated using the CKD EPI equation. This calculation has not been validated in all clinical situations. eGFR's persistently <60 mL/min signify possible Chronic Kidney Disease.    Anion gap 10 5 - 15  Protime-INR     Status: None   Collection Time: 05/07/2015 10:19 AM   Result Value Ref Range   Prothrombin Time 14.2 11.6 - 15.2 seconds   INR 1.08 0.00 - 1.49  I-Stat CG4 Lactic Acid, ED     Status: None   Collection Time: 05/14/2015 10:27 AM  Result Value Ref Range   Lactic Acid, Venous 1.53 0.5 - 2.0 mmol/L  Urinalysis, Routine w reflex microscopic (not at [facility])     Status: None   Collection Time: 05/03/2015 12:05 PM  Result Value Ref Range   Color, Urine YELLOW YELLOW   APPearance CLEAR CLEAR   Specific Gravity, Urine 1.020 1.005 - 1.030   pH 6.0 5.0 - 8.0   Glucose, UA NEGATIVE NEGATIVE mg/dL   Hgb urine dipstick NEGATIVE NEGATIVE   Bilirubin Urine NEGATIVE NEGATIVE   Ketones, ur NEGATIVE NEGATIVE mg/dL   Protein, ur NEGATIVE NEGATIVE mg/dL   Nitrite NEGATIVE NEGATIVE   Leukocytes, UA NEGATIVE NEGATIVE    Comment: MICROSCOPIC NOT DONE ON URINES WITH NEGATIVE PROTEIN, BLOOD, LEUKOCYTES, NITRITE, OR GLUCOSE <1000 mg/dL.  MRSA PCR Screening     Status: None   Collection Time: 05/04/2015  4:43 PM  Result Value Ref Range   MRSA by PCR NEGATIVE NEGATIVE    Comment:        The GeneXpert MRSA Assay (FDA approved for NASAL specimens only), is one component of a comprehensive MRSA colonization surveillance program. It is not intended to diagnose MRSA infection nor to guide or monitor treatment for MRSA infections.   CBC with Differential     Status: Abnormal   Collection Time: 04/29/2015  5:16 PM  Result Value Ref Range   WBC 9.9 4.0 - 10.5 K/uL   RBC 3.99 (L) 4.22 - 5.81 MIL/uL   Hemoglobin 10.7 (L) 13.0 - 17.0 g/dL   HCT 35.5 (L) 39.0 - 52.0 %   MCV 89.0 78.0 - 100.0 fL   MCH 26.8 26.0 - 34.0 pg   MCHC 30.1 30.0 - 36.0 g/dL   RDW 15.6 (H) 11.5 - 15.5 %   Platelets 153 150 - 400 K/uL   Neutrophils Relative % 86 %   Neutro Abs 8.6 (H) 1.7 - 7.7 K/uL   Lymphocytes Relative 8 %   Lymphs Abs 0.7 0.7 - 4.0 K/uL   Monocytes Relative 5 %   Monocytes Absolute 0.5 0.1 - 1.0 K/uL   Eosinophils Relative 1 %   Eosinophils Absolute 0.1 0.0 - 0.7 K/uL    Basophils Relative 0 %   Basophils Absolute 0.0 0.0 - 0.1 K/uL  Comprehensive metabolic panel     Status: Abnormal   Collection Time: 05/03/2015  5:16 PM  Result Value Ref Range   Sodium 143 135 - 145 mmol/L   Potassium 3.8 3.5 - 5.1 mmol/L   Chloride 109 101 - 111 mmol/L   CO2 24 22 - 32 mmol/L   Glucose, Bld 140 (H) 65 - 99 mg/dL   BUN 19 6 - 20 mg/dL   Creatinine, Ser 1.37 (H) 0.61 - 1.24 mg/dL   Calcium 8.3 (L) 8.9 - 10.3 mg/dL   Total Protein 6.0 (L) 6.5 - 8.1 g/dL   Albumin 2.3 (L) 3.5 - 5.0 g/dL   AST 99 (H) 15 -  41 U/L   ALT 47 17 - 63 U/L   Alkaline Phosphatase 479 (H) 38 - 126 U/L   Total Bilirubin 2.6 (H) 0.3 - 1.2 mg/dL   GFR calc non Af Amer 49 (L) >60 mL/min   GFR calc Af Amer 57 (L) >60 mL/min    Comment: (NOTE) The eGFR has been calculated using the CKD EPI equation. This calculation has not been validated in all clinical situations. eGFR's persistently <60 mL/min signify possible Chronic Kidney Disease.    Anion gap 10 5 - 15  Lactic acid, plasma     Status: Abnormal   Collection Time: 04/29/2015  5:16 PM  Result Value Ref Range   Lactic Acid, Venous 2.2 (HH) 0.5 - 2.0 mmol/L    Comment: CRITICAL RESULT CALLED TO, READ BACK BY AND VERIFIED WITH: TELLETIER,E RN AT 1809 ON 01.08.17 BY EPPERSON,S   Procalcitonin     Status: None   Collection Time: 05/03/2015  5:16 PM  Result Value Ref Range   Procalcitonin 3.17 ng/mL    Comment:        Interpretation: PCT > 2 ng/mL: Systemic infection (sepsis) is likely, unless other causes are known. (NOTE)         ICU PCT Algorithm               Non ICU PCT Algorithm    ----------------------------     ------------------------------         PCT < 0.25 ng/mL                 PCT < 0.1 ng/mL     Stopping of antibiotics            Stopping of antibiotics       strongly encouraged.               strongly encouraged.    ----------------------------     ------------------------------       PCT level decrease by                PCT < 0.25 ng/mL       >= 80% from peak PCT       OR PCT 0.25 - 0.5 ng/mL          Stopping of antibiotics                                             encouraged.     Stopping of antibiotics           encouraged.    ----------------------------     ------------------------------       PCT level decrease by              PCT >= 0.25 ng/mL       < 80% from peak PCT        AND PCT >= 0.5 ng/mL            Continuing antibiotics                                               encouraged.       Continuing antibiotics            encouraged.    ----------------------------     ------------------------------  PCT level increase compared          PCT > 0.5 ng/mL         with peak PCT AND          PCT >= 0.5 ng/mL             Escalation of antibiotics                                          strongly encouraged.      Escalation of antibiotics        strongly encouraged.   Protime-INR     Status: Abnormal   Collection Time: 05/04/2015  5:16 PM  Result Value Ref Range   Prothrombin Time 15.5 (H) 11.6 - 15.2 seconds   INR 1.21 0.00 - 1.49  APTT     Status: None   Collection Time: 05/06/2015  5:16 PM  Result Value Ref Range   aPTT 32 24 - 37 seconds  Lactic acid, plasma     Status: Abnormal   Collection Time: 05/07/2015  7:30 PM  Result Value Ref Range   Lactic Acid, Venous 2.3 (HH) 0.5 - 2.0 mmol/L    Comment: CRITICAL RESULT CALLED TO, READ BACK BY AND VERIFIED WITH: Sharlet Salina RN [id number] 05/15/2015 A [facility]   Lactic acid, plasma     Status: Abnormal   Collection Time: 05/11/2015 10:15 PM  Result Value Ref Range   Lactic Acid, Venous 2.8 (HH) 0.5 - 2.0 mmol/L    Comment: CRITICAL RESULT CALLED TO, READ BACK BY AND VERIFIED WITHElveria Rising RN [id number] 05/14/2015 A [facility]   Comprehensive metabolic panel     Status: Abnormal   Collection Time: 05/12/2015  5:00 AM  Result Value Ref Range   Sodium 145 135 - 145 mmol/L   Potassium 4.2 3.5 - 5.1 mmol/L   Chloride 113 (H) 101 - 111 mmol/L   CO2 19 (L) 22 - 32  mmol/L   Glucose, Bld 241 (H) 65 - 99 mg/dL   BUN 23 (H) 6 - 20 mg/dL   Creatinine, Ser 1.67 (H) 0.61 - 1.24 mg/dL   Calcium 7.7 (L) 8.9 - 10.3 mg/dL   Total Protein 5.6 (L) 6.5 - 8.1 g/dL   Albumin 2.0 (L) 3.5 - 5.0 g/dL   AST 103 (H) 15 - 41 U/L   ALT 51 17 - 63 U/L   Alkaline Phosphatase 414 (H) 38 - 126 U/L   Total Bilirubin 3.7 (H) 0.3 - 1.2 mg/dL   GFR calc non Af Amer 39 (L) >60 mL/min   GFR calc Af Amer 45 (L) >60 mL/min    Comment: (NOTE) The eGFR has been calculated using the CKD EPI equation. This calculation has not been validated in all clinical situations. eGFR's persistently <60 mL/min signify possible Chronic Kidney Disease.    Anion gap 13 5 - 15  Lactic acid, plasma     Status: Abnormal   Collection Time: 05/23/2015  5:00 AM  Result Value Ref Range   Lactic Acid, Venous 5.7 (HH) 0.5 - 2.0 mmol/L    Comment: CRITICAL RESULT CALLED TO, READ BACK BY AND VERIFIED WITH: Kathyrn Lass RN [id number] 05/03/2015 A [facility]   CBC     Status: Abnormal   Collection Time: 05/15/2015  6:52 AM  Result Value Ref Range   WBC 12.3 (H) 4.0 - 10.5 K/uL  RBC 3.66 (L) 4.22 - 5.81 MIL/uL   Hemoglobin 10.0 (L) 13.0 - 17.0 g/dL   HCT 33.0 (L) 39.0 - 52.0 %   MCV 90.2 78.0 - 100.0 fL   MCH 27.3 26.0 - 34.0 pg   MCHC 30.3 30.0 - 36.0 g/dL   RDW 16.2 (H) 11.5 - 15.5 %   Platelets 138 (L) 150 - 400 K/uL  Lactic acid, plasma     Status: Abnormal   Collection Time: 05/14/2015  6:52 AM  Result Value Ref Range   Lactic Acid, Venous 6.2 (HH) 0.5 - 2.0 mmol/L    Comment: CRITICAL RESULT CALLED TO, READ BACK BY AND VERIFIED WITH: GARMAN,P. RN AT [phone] 05/04/2015 MULLINS,T CRITICAL RESULT CALLED TO, READ BACK BY AND VERIFIED WITH: WILLIAMS,V. RN AT [phone] 04/23/2015 MULLINS,T   Glucose, capillary     Status: Abnormal   Collection Time: 05/20/2015  7:39 AM  Result Value Ref Range   Glucose-Capillary 215 (H) 65 - 99 mg/dL    Ct Abdomen Pelvis W Contrast  04/29/2015  CLINICAL DATA:  Pancreas cancer.  Generalize  weakness. EXAM: CT ABDOMEN AND PELVIS WITH CONTRAST TECHNIQUE: Multidetector CT imaging of the abdomen and pelvis was performed using the standard protocol following bolus administration of intravenous contrast. CONTRAST:  38m OMNIPAQUE IOHEXOL 300 MG/ML SOLN, 1047mOMNIPAQUE IOHEXOL 300 MG/ML SOLN COMPARISON:  03/29/2015 FINDINGS: Lower chest: Small pleural effusions noted, left greater than right. Hepatobiliary: No focal liver abnormality identified. There is a common bile duct stent in place. Progressive dilatation of the common bile duct is noted which now measures 1.2 cm. Pneumobilia however is present confirming biliary patency. Pneumobilia is identified compatible with biliary patency. Pancreas: Progressive dilatation of the pancreatic duct. Ste the mass within the head of pancreas measures 2.1 cm, image 34 of series 2. This is compared with 1.9 cm previously. Spleen: Normal appearance of the spleen. Adrenals/Urinary Tract: The adrenal glands are both normal. The left kidney is unremarkable. Cyst within the inferior pole of the right kidney measures 1.4 cm. No obstructive uropathy. The urinary bladder appears normal. Stomach/Bowel: Small hiatal hernia. The stomach and small bowel loops are otherwise unremarkable. No pathologic dilatation of the large or small bowel loops identified. Vascular/Lymphatic: Calcified atherosclerotic disease involves the abdominal aorta. No aneurysm. No enlarged retroperitoneal or mesenteric adenopathy. No enlarged pelvic or inguinal lymph nodes. Portacaval lymph node measures 1.4 cm, image 25 of series 2. This is compared with 1.3 cm previously. The index pre caval lymph node measures 1 cm, image 31 of series 2. Unchanged from previous exam. No enlarged pelvic lymph nodes. Reproductive: The prostate gland and seminal vesicles are unremarkable. Other: There is no ascites or focal fluid collections within the abdomen or pelvis. Musculoskeletal: There is an acute right lateral  eighth rib fracture identified, image number 2 of series 2. No aggressive lytic or sclerotic bone lesions identified. IMPRESSION: 1. Progressive dilatation of the common bile duct and pancreatic duct status post CBD stenting. Pneumobilia is identified compatible with common bile duct patency. Pancreatic head mass is not significantly changed in size from previous exam. 2. There is an acute right eighth rib fracture identified. 3. Aortic atherosclerosis 4. No evidence for bowel obstruction or abscess formation. Electronically Signed   By: TaKerby Moors.D.   On: 04/25/2015 13:52   Dg Chest Port 1 View  05/04/2015  CLINICAL DATA:  [age]ear old male with fever EXAM: PORTABLE CHEST 1 VIEW COMPARISON:  Prior chest x-ray 04/14/2015 FINDINGS: Cardiac and mediastinal contours  remain unchanged. Patient is status post median sternotomy with evidence of prior multivessel CABG. Healing right-sided rib fractures. No new focal consolidation, pneumonia or pleural effusion. Mild pulmonary vascular congestion without overt edema. Stable bronchitic change. No acute osseous abnormality. IMPRESSION: 1. No acute cardiopulmonary process. 2. Healing right-sided rib fractures. No pneumothorax or pleural effusion. 3. Aortic atherosclerosis. Electronically Signed   By: Jacqulynn Cadet M.D.   On: 05/19/2015 10:57               Blood pressure 104/44, pulse 116, temperature 102 F (38.9 C), temperature source Axillary, resp. rate 25, height _0  (1.727 m), weight 83.6 kg (184 lb 4.9 oz), SpO2 95 %.  Physical exam:   General--confused white male ENT-- not grossly icteric   Heart-- tachycardic  Lungs-- clear  Abdomen-- tender on the right side bowel sounds present  Psych-- confused    Assessment: 1. Pancreatic cancer with biliary obstruction and progressive increase in liver test with sepsis. This is his 2nd episode in the past several weeks. Even though he does have pneumobilia indicate the stem is training it  probably does need to be changed.  2. Pancreatic cancer. Apparently failed to respond to chemo and radiation. Unfortunately, the doctors that have been seeing him on a routine basis are not available so we don't know really what conversations have been had with he and his family 3. Status post CABG   Plan: would go ahead and continue on broad-spectrum antibiotics. Will discuss with the biliary ERCP physicians group about timing of stent change. If the patient and family desire, he could be transferred back to [facility] where physicians were more familiar with him.. He apparently is still a full code but this should probably be addressed.   Dahir Ayer JR,Kinsey Karch L 05/12/2015, 8:20 AM   Pager: [phone] If no answer or after hours call [phone]

## 2015-05-01 NOTE — Anesthesia Procedure Notes (Signed)
Procedure Name: Intubation Date/Time: 05/07/2015 7:00 PM Performed by: Danley Danker L Pre-anesthesia Checklist: Patient identified, Emergency Drugs available, Suction available and Patient being monitored Patient Re-evaluated:Patient Re-evaluated prior to inductionOxygen Delivery Method: Circle System Utilized Preoxygenation: Pre-oxygenation with 100% oxygen Intubation Type: IV induction Ventilation: Mask ventilation without difficulty Laryngoscope Size: Mac and 4 Grade View: Grade II Tube type: Oral (slight anterior) Tube size: 7.5 mm Number of attempts: 1 Airway Equipment and Method: Stylet and Oral airway Placement Confirmation: ETT inserted through vocal cords under direct vision,  positive ETCO2 and breath sounds checked- equal and bilateral Secured at: 21 cm Tube secured with: Tape Dental Injury: Teeth and Oropharynx as per pre-operative assessment

## 2015-05-01 NOTE — Care Management Note (Signed)
Case Management Note  Patient Details  Name: SONU WAMSER MRN: [id number] Date of Birth: [birth year]  Subjective/Objective:              sepsis      Action/Plan:Date: May 01, 2015 Chart reviewed for concurrent status and case management needs. Will continue to follow patient for changes and needs: Velva Harman, RN, BSN, [place]   [phone]   Expected Discharge Date:   (UNKNOWN)               Expected Discharge Plan:  Home/Self Care  In-House Referral:  NA  Discharge planning Services  CM Consult  Post Acute Care Choice:  NA Choice offered to:  NA  DME Arranged:    DME Agency:     HH Arranged:    HH Agency:     Status of Service:  In process, will continue to follow  Medicare Important Message Given:    Date Medicare IM Given:    Medicare IM give by:    Date Additional Medicare IM Given:    Additional Medicare Important Message give by:     If discussed at [place] of Stay Meetings, dates discussed:    Additional Comments:  Leeroy Cha, RN 05/13/2015, 10:23 AM

## 2015-05-02 ENCOUNTER — Ambulatory Visit: Payer: Commercial Managed Care - HMO

## 2015-05-02 ENCOUNTER — Encounter: Payer: Self-pay | Admitting: *Deleted

## 2015-05-02 ENCOUNTER — Encounter (HOSPITAL_COMMUNITY): Admission: EM | Disposition: E | Payer: Self-pay | Source: Home / Self Care | Attending: Internal Medicine

## 2015-05-02 DIAGNOSIS — J9601 Acute respiratory failure with hypoxia: Secondary | ICD-10-CM

## 2015-05-02 DIAGNOSIS — A419 Sepsis, unspecified organism: Principal | ICD-10-CM

## 2015-05-02 DIAGNOSIS — C259 Malignant neoplasm of pancreas, unspecified: Secondary | ICD-10-CM

## 2015-05-02 LAB — CBC WITH DIFFERENTIAL/PLATELET
BASOS ABS: 0 10*3/uL (ref 0.0–0.1)
BASOS PCT: 0 %
EOS ABS: 0 10*3/uL (ref 0.0–0.7)
Eosinophils Relative: 0 %
HCT: 30.7 % — ABNORMAL LOW (ref 39.0–52.0)
Hemoglobin: 9.4 g/dL — ABNORMAL LOW (ref 13.0–17.0)
Lymphocytes Relative: 9 %
Lymphs Abs: 0.8 10*3/uL (ref 0.7–4.0)
MCH: 27.8 pg (ref 26.0–34.0)
MCHC: 30.6 g/dL (ref 30.0–36.0)
MCV: 90.8 fL (ref 78.0–100.0)
MONO ABS: 0.6 10*3/uL (ref 0.1–1.0)
Monocytes Relative: 6 %
NEUTROS ABS: 7.8 10*3/uL — AB (ref 1.7–7.7)
NEUTROS PCT: 85 %
PLATELETS: 95 10*3/uL — AB (ref 150–400)
RBC: 3.38 MIL/uL — ABNORMAL LOW (ref 4.22–5.81)
RDW: 16.7 % — ABNORMAL HIGH (ref 11.5–15.5)
WBC: 9.2 10*3/uL (ref 4.0–10.5)

## 2015-05-02 LAB — COMPREHENSIVE METABOLIC PANEL
ALT: 103 U/L — ABNORMAL HIGH (ref 17–63)
ANION GAP: 13 (ref 5–15)
AST: 235 U/L — ABNORMAL HIGH (ref 15–41)
Albumin: 2 g/dL — ABNORMAL LOW (ref 3.5–5.0)
Alkaline Phosphatase: 338 U/L — ABNORMAL HIGH (ref 38–126)
BUN: 44 mg/dL — ABNORMAL HIGH (ref 6–20)
CHLORIDE: 115 mmol/L — AB (ref 101–111)
CO2: 19 mmol/L — AB (ref 22–32)
Calcium: 7.7 mg/dL — ABNORMAL LOW (ref 8.9–10.3)
Creatinine, Ser: 2.45 mg/dL — ABNORMAL HIGH (ref 0.61–1.24)
GFR calc non Af Amer: 24 mL/min — ABNORMAL LOW (ref >60)
GFR, EST AFRICAN AMERICAN: 28 mL/min — AB (ref >60)
Glucose, Bld: 156 mg/dL — ABNORMAL HIGH (ref 65–99)
Potassium: 4.4 mmol/L (ref 3.5–5.1)
SODIUM: 147 mmol/L — AB (ref 135–145)
Total Bilirubin: 3 mg/dL — ABNORMAL HIGH (ref 0.3–1.2)
Total Protein: 5.6 g/dL — ABNORMAL LOW (ref 6.5–8.1)

## 2015-05-02 LAB — GLUCOSE, CAPILLARY
GLUCOSE-CAPILLARY: 146 mg/dL — AB (ref 65–99)
Glucose-Capillary: 155 mg/dL — ABNORMAL HIGH (ref 65–99)

## 2015-05-02 LAB — LACTIC ACID, PLASMA: Lactic Acid, Venous: 5.5 mmol/L (ref 0.5–2.0)

## 2015-05-02 SURGERY — ERCP, WITH INTERVENTION IF INDICATED
Anesthesia: Monitor Anesthesia Care

## 2015-05-02 MED ORDER — MORPHINE SULFATE (PF) 2 MG/ML IV SOLN
INTRAVENOUS | Status: AC
  Filled 2015-05-02: qty 2

## 2015-05-02 MED ORDER — DEXTROSE 5 % IV SOLN: 1.0000 g | INTRAVENOUS | Status: DC

## 2015-05-02 MED ORDER — MORPHINE SULFATE (PF) 2 MG/ML IV SOLN
2.0000 mg | INTRAVENOUS | Status: DC | PRN
  Administered 2015-05-02: 2 mg via INTRAVENOUS
  Filled 2015-05-02: qty 1

## 2015-05-02 MED ORDER — VANCOMYCIN HCL IN DEXTROSE 1-5 GM/200ML-% IV SOLN: 1000.0000 mg | INTRAVENOUS | Status: DC

## 2015-05-02 MED ORDER — ATROPINE SULFATE 1 % OP SOLN
2.0000 [drp] | Freq: Four times a day (QID) | OPHTHALMIC | Status: DC | PRN
  Filled 2015-05-02: qty 2

## 2015-05-02 MED ORDER — SODIUM CHLORIDE 0.9 % IV BOLUS (SEPSIS)
500.0000 mL | Freq: Once | INTRAVENOUS | Status: AC
  Administered 2015-05-02: 500 mL via INTRAVENOUS

## 2015-05-02 MED ORDER — MORPHINE SULFATE (PF) 2 MG/ML IV SOLN: 1.0000 mg | INTRAVENOUS | Status: DC | PRN

## 2015-05-02 MED ORDER — MORPHINE SULFATE (PF) 4 MG/ML IV SOLN
4.0000 mg | Freq: Once | INTRAVENOUS | Status: AC
  Administered 2015-05-02: 4 mg via INTRAVENOUS

## 2015-05-03 ENCOUNTER — Ambulatory Visit: Payer: Commercial Managed Care - HMO

## 2015-05-03 ENCOUNTER — Encounter: Payer: Self-pay | Admitting: *Deleted

## 2015-05-04 ENCOUNTER — Ambulatory Visit: Payer: Commercial Managed Care - HMO

## 2015-05-05 ENCOUNTER — Ambulatory Visit: Payer: Commercial Managed Care - HMO

## 2015-05-05 LAB — CULTURE, BLOOD (ROUTINE X 2)
CULTURE: NO GROWTH
Culture: NO GROWTH

## 2015-05-08 ENCOUNTER — Ambulatory Visit: Payer: Commercial Managed Care - HMO

## 2015-05-09 ENCOUNTER — Ambulatory Visit
Admission: RE | Admit: 2015-05-09 | Payer: Commercial Managed Care - HMO | Source: Ambulatory Visit | Admitting: Gastroenterology

## 2015-05-09 ENCOUNTER — Ambulatory Visit: Payer: Commercial Managed Care - HMO

## 2015-05-09 ENCOUNTER — Encounter: Admission: RE | Payer: Self-pay | Source: Ambulatory Visit

## 2015-05-09 SURGERY — ERCP, WITH INTERVENTION IF INDICATED
Anesthesia: General

## 2015-05-10 ENCOUNTER — Ambulatory Visit: Payer: Commercial Managed Care - HMO

## 2015-05-11 ENCOUNTER — Ambulatory Visit: Payer: Commercial Managed Care - HMO

## 2015-05-12 ENCOUNTER — Ambulatory Visit: Payer: Commercial Managed Care - HMO

## 2015-05-15 ENCOUNTER — Ambulatory Visit: Payer: Commercial Managed Care - HMO

## 2015-05-16 ENCOUNTER — Ambulatory Visit: Payer: Commercial Managed Care - HMO

## 2015-05-17 ENCOUNTER — Ambulatory Visit: Payer: Commercial Managed Care - HMO

## 2015-05-18 ENCOUNTER — Ambulatory Visit: Payer: Commercial Managed Care - HMO

## 2015-05-19 ENCOUNTER — Ambulatory Visit: Payer: Commercial Managed Care - HMO

## 2015-05-22 ENCOUNTER — Ambulatory Visit: Payer: Commercial Managed Care - HMO

## 2015-05-23 ENCOUNTER — Ambulatory Visit: Payer: Commercial Managed Care - HMO

## 2015-05-24 ENCOUNTER — Other Ambulatory Visit: Payer: Commercial Managed Care - HMO

## 2015-05-24 ENCOUNTER — Ambulatory Visit: Payer: Commercial Managed Care - HMO

## 2015-05-24 ENCOUNTER — Ambulatory Visit: Payer: Commercial Managed Care - HMO | Admitting: Internal Medicine

## 2015-05-24 NOTE — Consult Note (Signed)
PULMONARY / CRITICAL CARE MEDICINE   Name: [name] MRN: [id number] DOB: Aug 14, 1940    ADMISSION DATE:  05/09/2015 CONSULTATION DATE:  May 31, 2015  REFERRING MD:  Dr. 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    The patient was admitted per [facility] for further evaluation.  Initial work up concerning for sepsis with suspected biliary source. CT of the abdomen on 1/8 showed progressive dilation of the common bile duct and pancreatic duct s/p stenting, pneumobilia identified compatible with common bile duct patency, pancreatic head mass unchanged, acute 8th rib fracture, no evidence of bowel obstruction.  He was treated with volume resuscitation and IV antibiotics.  GI was consulted and the patient was taken for ERCP on 1/9 PM which identified copious amounts of frank pus within the bile duct with existing stent migrated distally, stent replaced and area lavaged.  On 1/10 the patient was noted to have concerns for worsening sepsis - rising lactic acid, tachycardia and confusion. PCCM consulted.  After evaluation, the patient was noted to be altered & gurgling. NTS attempted with no cough or gag, lending concern for poor airway protection.  Family was notified and they indicate the patient has a living will and he would not want artifical support.  After evaluation, the patient had episodes of bradycardia with further decline in mental status.    PAST MEDICAL HISTORY :  He  has a past medical history of Hyperlipidemia; H/O cardiac catheterization (06/03/06,12/03/11); BPH (benign prostatic hypertrophy); Anxiety; Syncope;  Myocardial infarction [facility]); CHF (congestive heart failure) ([place]); CAD (coronary artery disease) (06/03/06); Asthma; Sleep apnea; Kidney stone; GERD (gastroesophageal reflux disease); Cancer ([place]); Arthritis; Anemia; Cataract, bilateral; Neuromuscular disorder ([place]); Diabetes mellitus without complication ([place]); and Pancreatic adenocarcinoma ([place]).  PAST SURGICAL HISTORY: He  has past surgical history that includes Spine surgery (9/05); cervical herniated; Eye surgery; Coronary artery bypass graft (12/12/2011); Cardiac catheterization (N/A, 10/26/2014); Endoscopic retrograde cholangiopancreatography (ercp) with propofol (N/A, 02/13/2015); and EUS (N/A, 02/23/2015).  Allergies  Allergen Reactions  . Zithromax [Azithromycin] Anaphylaxis and Other (See Comments)    Reaction:  Stroke-like symptoms   . Lisinopril Other (See Comments)    Per spouse, pt did not tolerate well in the past.      No current facility-administered medications on file prior to encounter.   Current Outpatient Prescriptions on File Prior to Encounter  Medication Sig  . acetaminophen (TYLENOL) 650 MG CR tablet Take 650 mg by mouth every 6 (six) hours as needed for pain.   Marland Kitchen aspirin EC 81 MG tablet Take 81 mg by mouth daily.  . fentaNYL (DURAGESIC - DOSED MCG/HR) 25 MCG/HR patch Place 1 patch (25 mcg total) onto the skin every 3 (three) days.  . fluconazole (DIFLUCAN) 100 MG tablet Take 1 tablet (100 mg total) by mouth daily.  Marland Kitchen gabapentin (NEURONTIN) 300 MG capsule Take 300 mg by mouth 2 (two) times daily.  Marland Kitchen HYDROcodone-acetaminophen (NORCO/VICODIN) 5-325 MG tablet Take 2 tablets by mouth every 6 (six) hours as needed. For pain. (Patient taking differently: Take 2 tablets by mouth every 6 (six) hours as needed for moderate pain or severe  pain. For pain.)  . insulin aspart (NOVOLOG) 100 UNIT/ML injection Inject 5 Units into the skin 3 (three) times daily with meals. (Patient taking differently: Inject 2-10 Units into the skin 3  (three) times daily with meals. Take 7 units with each meal along with sliding scale accordingly  151-200=2 units 201-250=4 units 251-300=6 units 301-351=8 units 400+=10 units and call MD)  . insulin glargine (LANTUS) 100 UNIT/ML injection Inject 0.4 mLs (40 Units total) into the skin 2 (two) times daily.  Marland Kitchen lactulose (CHRONULAC) 10 GM/15ML solution Take 45 mLs (30 g total) by mouth daily as needed for mild constipation. (Patient taking differently: Take 30 g by mouth daily. )  . lovastatin (MEVACOR) 20 MG tablet Take 20 mg by mouth at bedtime.  . meloxicam (MOBIC) 7.5 MG tablet Take 1 tablet by mouth daily.  Marland Kitchen omeprazole (PRILOSEC) 20 MG capsule Take 20 mg by mouth 2 (two) times daily.  . ondansetron (ZOFRAN) 4 MG tablet Take 4 mg by mouth every 8 (eight) hours as needed for nausea or vomiting. Reported on 04/10/2015  . polyethylene glycol powder (GLYCOLAX/MIRALAX) powder Take 17 g by mouth daily as needed for mild constipation. Reported on 04/10/2015  . predniSONE (DELTASONE) 10 MG tablet Take 10 mg by mouth daily with breakfast. Reported on 04/25/2015  . torsemide (DEMADEX) 20 MG tablet Take 20 mg by mouth once.   . cephALEXin (KEFLEX) 500 MG capsule Take 1 capsule (500 mg total) by mouth 3 (three) times daily. (Patient not taking: Reported on 04/23/2015)  . metroNIDAZOLE (FLAGYL) 500 MG tablet Take 1 tablet (500 mg total) by mouth 3 (three) times daily. (Patient not taking: Reported on 04/26/2015)    FAMILY HISTORY:  His indicated that his mother is deceased. He indicated that his father is deceased.   SOCIAL HISTORY: He  reports that he quit smoking about 37 years ago. His smoking use included Cigarettes and Cigars. He has never used smokeless tobacco. He reports that he does not drink alcohol or use illicit drugs.  REVIEW OF SYSTEMS:   Unable to complete as patient is altered.  Information obtained from staff at bedside, prior medical documentation and family.    SUBJECTIVE:   VITAL  SIGNS: BP 118/69 mmHg  Pulse 148  Temp(Src) 99 F (37.2 C) (Oral)  Resp 26  Ht 5\' 8"  (1.727 m)  Wt 197 lb 8.5 oz (89.6 kg)  BMI 30.04 kg/m2  SpO2 97%  HEMODYNAMICS:    VENTILATOR SETTINGS:    INTAKE / OUTPUT: I/O last 3 completed shifts: In: 5630.8 [I.V.:3480.8; IV [id number] Out: 526 [Urine:525; Stool:1]  PHYSICAL EXAMINATION: General:  Elderly male lying in bed, distant stare, reaching out  Neuro:  Minimal response to name, then returns to stare, spontaneous movement of all ext's  HEENT:  MM dry, no jvd  Cardiovascular:  s1s2 difficult to hear due to adventitious breath sounds  Lungs:  Gurgling respirations, rhonchi bilaterally  Abdomen:  Obese, tender to palpation on R Musculoskeletal:  No acute deformities  Skin:  Pale, mottled  LABS:  BMET  Recent Labs Lab 05/15/2015 1716 04/24/2015 0500 06-01-2015 0335  NA 143 145 147*  K 3.8 4.2 4.4  CL 109 113* 115*  CO2 24 19* 19*  BUN 19 23* 44*  CREATININE 1.37* 1.67* 2.45*  GLUCOSE 140* 241* 156*    Electrolytes  Recent Labs Lab 04/27/2015 1716 05/13/2015 0500 06-01-2015 0335  CALCIUM 8.3* 7.7* 7.7*    CBC  Recent Labs Lab 05/14/2015 1716 04/26/2015 [id number]  May 26, 2015 0335  WBC 9.9 12.3* 9.2  HGB 10.7* 10.0* 9.4*  HCT 35.5* 33.0* 30.7*  PLT 153 138* 95*    Coag's  Recent Labs Lab 05/11/2015 1019 05/07/2015 1716  APTT  --  32  INR 1.08 1.21    Sepsis Markers  Recent Labs Lab 05/08/2015 1716  05/23/2015 0500 04/30/2015 0652 05/11/2015 1609  LATICACIDVEN 2.2*  < > 5.7* 6.2* 6.5*  PROCALCITON 3.17  --   --   --   --   < > = values in this interval not displayed.  ABG No results for input(s): PHART, PCO2ART, PO2ART in the last 168 hours.  Liver Enzymes  Recent Labs Lab 04/26/2015 1716 04/26/2015 0500 26-May-2015 0335  AST 99* 103* 235*  ALT 47 51 103*  ALKPHOS 479* 414* 338*  BILITOT 2.6* 3.7* 3.0*  ALBUMIN 2.3* 2.0* 2.0*    Cardiac Enzymes No results for input(s): TROPONINI, PROBNP in the last 168  hours.  Glucose  Recent Labs Lab 05/08/2015 0739 05/17/2015 1233 05/17/2015 1607 04/24/2015 2005 05/01/15 2140 2015/05/26 0825  GLUCAP 215* 224* 221* 160* 145* 146*    Imaging No results found.   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  Admitted for suspected biliary sepsis.  ERCP on 1/9 noted prior stent had migrated and copious amounts of frank pus within the bile duct, stent replaced.  Developed AMS, gurgling respirations on 1/10 & PCCM consulted.  After further discussions with family regarding patients expressed wishes, they indicate he would not want aggressive interventions.  After assessment on 1/10, he had further rapid decline with a bradycardic episode and change in mental status - appears to be transitioning toward death.    ASSESSMENT / PLAN:  Biliary Sepsis s/p ERCP  Pancreatic Cancer - IIB, T3, N0 Acute Respiratory Failure - with inability to protect airway in the setting of sepsis.  Family declines intubation / ACLS.    Plan: PRN morphine for comfort if needed Continue plan of care per primary SVC - initially discussed continuing full medical care but given acute changes, likely will need shift toward comfort care Unrestricted visitation for family Chaplain support  Consider d/c ASA, abx, SSI etc  Add PRN atropine gtt's for increased secretions    FAMILY  - Updates: Daughter updated via phone and in person on arrival.   - Inter-disciplinary family meet or Palliative Care meeting due by:  Ongoing.     Noe Gens, NP-C [facility] Pgr: [phone] or if no answer [phone] May 26, 2015, 11:57 AM

## 2015-05-24 NOTE — Discharge Summary (Addendum)
Death Summary  [name] [id number] DOB: 1940-07-29 DOA: 05/16/15  PCP: Rusty Aus., MD PCP/Office notified:   Admit date: May 16, 2015 Date of Death: May 18, 2015  Final Diagnoses:  Principal Problem:   Sepsis ([place]) Active Problems:   Pancreatic cancer ([place])   Weakness   Acute kidney injury ([place])   Hypernatremia   Anemia of chronic disease   DM (diabetes mellitus) type II controlled, neurological manifestation ([facility])    Severe sepsis Cholangitis  Respiratory  failure     History of present illness:  75 y/o male with PMH of HTN, DM, CAD h/o CABG, CHF, Recent Dx of Pancreatic CA, complicated with recurrent infections, bacteremia, biliary sepsis, neumonia, underwent ERCP in 02/13/2015 and had stenting of CBD presented with progressive weakness, lethargy. Patient found to have recurrent sepsis, suspected due to biliary Source.  -"pt has been recently hospitalized for sepsis, Klebsiella pneumonia and bacteremia (November 2016), again admitted December 2016 for sepsis and at that time biliary source was thought to be the culprit (blood cultures were negative at that time). Pt has completed course of ABX on December 28th, 2016 and daughter says pt has continued to decline, minimal oral intake, has been mostly bed bound, fevers as high as 103 F"  Hospital Course:  1. Severe sepsis. Suspected biliary source. Pt remained tachycardic, febrile, septic actic acid is tended up. Borderline soft BP. Patient rapidly deteriorated despite IVF resuscitation, IV atx and ERCP stent exchange on 1/9.  2. Pancreatic adenocarcinoma stage IIB- T3 N0. possible invasion of the mesenteric vein on EUS.  -CT abd May 15, 2022): Progressive dilatation of the common bile duct and pancreatic duct status post CBD stenting. Pneumobilia is identified compatiblewith common bile duct patency. Pancreatic head mass is not significantly changed in size from previous exam. -Pt underwent ERCP stent exchange on 1/9 (Copious  amounts of frank pus within the bile duct with existing stent migrated distally).  3. AKI. Likely ATN due to sepsis. 4. CAD h/o CABG, CHF. DM.    Prognosis was poor due to locally advanced pancreatic cancer with recurrent complications, infections, sepsis. Unfortunately, he developed another episode of sever sepsis with rapid deterioration despite aggressive care with antimitotics, stent exchange and resuscitation. He developed multiorgan dysfunction  -as per patient, and his family's wishes he was DNR. He died on 05/18/15 at 14.34 family at the bedside    Time: 14.34  Signed:  Kinnie Feil  [facility] 05-18-15, 4:22 PM     Addendum: Yes - Stage 2 decubitus ulcer to sacrum, present on admission Sumiye Hirth N

## 2015-05-24 NOTE — Patient Outreach (Signed)
[facility]) Care Management  2015/05/27  Allie Kubacki Taaffe [birth year] [id number]   EPIC notes reviewed, Noted patient expired today. I will alert Lurline Del, CMA at [facility] for case closure.  Joylene Draft, RN, [place] [facility] [phone]- Mobile [phone]- Toll Free Main Office

## 2015-05-24 NOTE — Progress Notes (Signed)
[facility] PROGRESS NOTE Subjective patient had ERCP with stent change yesterday with large amount of pus draining from biliary system after stent was exchanged. This was consistent with occluded stent, infection, and cholangitis. The area was lavage. He is on broad-spectrum antibiotics but appears to be having worsening sepsis. He has been seen by CCM  Objective: Vital signs in last 24 hours: Temp:  [97.4 F (36.3 C)-101 F (38.3 C)] 99 F (37.2 C) (01/10 0800) Pulse Rate:  [43-148] 43 (01/10 1245) Resp:  [10-32] 10 (01/10 1245) BP: (88-128)/(40-74) 118/69 mmHg (01/10 1110) SpO2:  [46 %-99 %] 46 % (01/10 1245) Weight:  [89.6 kg (197 lb 8.5 oz)] 89.6 kg (197 lb 8.5 oz) (01/10 0500) Last BM Date: 05/14/2015  Intake/Output from previous day: 01/09 0701 - 01/10 0700 In: 2750 [I.V.:2150; IV Piggyback:600] Out: 325 [Urine:325] Intake/Output this shift: Total I/O In: 1382.9 [I.V.:1332.9; IV Piggyback:50] Out: 45 [Urine:45]  PE:  General-- minimally responsive  Heart-- tachycardic Lungs-- course breath sounds throughout Abdomen-- nondistended possibly tender but no rigidity  Lab Results:  Recent Labs  04/25/2015 1019 05/21/2015 1716 04/29/2015 0652 05/20/15 0335  WBC 7.6 9.9 12.3* 9.2  HGB 10.9* 10.7* 10.0* 9.4*  HCT 35.7* 35.5* 33.0* 30.7*  PLT 179 153 138* 95*   BMET  Recent Labs  05/06/2015 1019 05/07/2015 1716 05/10/2015 0500 20-May-2015 0335  NA 146* 143 145 147*  K 4.2 3.8 4.2 4.4  CL 112* 109 113* 115*  CO2 24 24 19* 19*  CREATININE 1.17 1.37* 1.67* 2.45*   LFT  Recent Labs  05/08/2015 1716 04/25/2015 0500 2015-05-20 0335  PROT 6.0* 5.6* 5.6*  AST 99* 103* 235*  ALT 47 51 103*  ALKPHOS 479* 414* 338*  BILITOT 2.6* 3.7* 3.0*   PT/INR  Recent Labs  05/23/2015 1019 05/22/2015 1716  LABPROT 14.2 15.5*  INR 1.08 1.21   PANCREAS No results for input(s): LIPASE in the last 72 hours.       Studies/Results: Dg Ercp  May 20, 2015  CLINICAL DATA:   75 year old male with a history of pancreatic adenocarcinoma EXAM: ERCP TECHNIQUE: Multiple spot images obtained with the fluoroscopic device and submitted for interpretation post-procedure. FLUOROSCOPY TIME:  1 minutes 26 seconds for a total of 42.39 mGy reported Please see GI operative note for further detail COMPARISON:  CT abdomen/ pelvis 05/18/2015 FINDINGS: A total of 3 intraoperative spot images demonstrate a flexible endoscope in the descending duodenum with cannulation of the common bile duct. Contrast opacification of the common duct demonstrates dilatation of the common hepatic and common bile ducts and irregular stricturing of the distal common bile duct. On the final image, a metallic stent has been deployed. IMPRESSION: ERCP with covered metal biliary stent placement. Prior to stent placement there is an irregular narrowing of the distal common bile duct concerning for malignant stricture. These images were submitted for radiologic interpretation only. Please see the procedural report for the amount of contrast and the fluoroscopy time utilized. Electronically Signed   By: Jacqulynn Cadet M.D.   On: 05-20-15 07:35    Medications: I have reviewed the patient's current medications.  Assessment/Plan: 1. Sepsis. Probably due to cholangitis due to biliary obstruction from pancreatic cancer. Consideration is being given toward comfort measures given his diffuse pancreatic cancer that is been nonresponsive to treatment.   Alexah Kivett JR,Benetta Maclaren L 20-May-2015, 2:51 PM  Pager: [phone] If no answer or after hours call [phone]

## 2015-05-24 NOTE — Progress Notes (Signed)
Pharmacy Antibiotic Follow-up Note  [name] is a 75 y.o. year-old male admitted on 05/17/2015.  The patient is currently on day #3 of vancomycin/cefepime for pneumonia/intra-abdominal infection with sepsis. Recent K. Pneumoniae bacteremia related to pancreatic cancer and biliary obstruction. ERCP performed 1/9 with frank pus within bile duct with previous stent migrating distally.    Assessment/Plan:  Based on worsening renal function, will adjust vancomycin to 1gm IV q48h and follow-up with am labs.  Will check vancomycin random level in am to evaluate clearance prior to giving dose 1/11  Adjust cefepime to 1gm IV q24h for worsening renal function  Temp (24hrs), Avg:99 F (37.2 C), Min:97.4 F (36.3 C), Max:101 F (38.3 C)   Recent Labs Lab 04/25/15 1001 05/17/2015 1019 05/07/2015 1716 04/28/2015 0652 05-28-2015 0335  WBC 7.0 7.6 9.9 12.3* 9.2    Recent Labs Lab 04/25/15 1001 05/04/2015 1019 05/14/2015 1716 04/24/2015 0500 05/28/15 0335  CREATININE 1.61* 1.17 1.37* 1.67* 2.45*   Estimated Creatinine Clearance: 28.8 mL/min (by C-G formula based on Cr of 2.45).    Allergies  Allergen Reactions  . Zithromax [Azithromycin] Anaphylaxis and Other (See Comments)    Reaction:  Stroke-like symptoms   . Lisinopril Other (See Comments)    Per spouse, pt did not tolerate well in the past.      Antimicrobials this admission: 1/8 Vanco>> >>  1/8 Zosyn x 1  1/8 Cefepime >>  Levels/dose changes this admission:   Microbiology results: 11/5 blood: K. Pneumo (R to amp only)  1/8 blood: NGTD 1/8 urine: 1K insignificant growth MRSA PCR neg   Thank you for allowing pharmacy to be a part of this patient's care.  Doreene Eland, PharmD, BCPS.   Pager: [phone]/05/28 11:12 AM

## 2015-05-24 NOTE — Progress Notes (Signed)
[facility] PROGRESS NOTE  [name] [id number] DOB: 03-28-1941 DOA: 05/12/2015 PCP: Rusty Aus., MD  Assessment/Plan: 75 y/o male with PMH of HTN, DM, CAD h/o CABG, CHF, Recent Dx of Pancreatic CA, complicated with recurrent infections, bacteremia, biliary sepsis, neumonia, underwent ERCP in 02/13/2015 and had stenting of CBD presented with progressive weakness, lethargy. Patient found to have recurrent sepsis, suspected due to biliary  Source.  -admitted with severe sepsis  1. Severe sepsis. Suspected biliary source. Pt is still tachycardic, febrile, lactic acid is tending up. Borderline soft BP. But remains hemodynamically stable.  -We will cont IVF resuscitation as needed. Cont IV atx, prelim blood cultures: NGTD. S/p ERCP stent exchange on 1/9. Cont monitor in SDU. May need pressure support   2. Pancreatic adenocarcinoma stage IIB- T3 N0. possible invasion of the mesenteric vein on EUS.  -CT abd (1/8): Progressive dilatation of the common bile duct and pancreatic duct status post CBD stenting. Pneumobilia is identified compatiblewith common bile duct patency. Pancreatic head mass is not significantly changed in size from previous exam. -Pt underwent ERCP stent exchange on 1/9 (Copious amounts of frank pus within the bile duct with existing stent migrated distally). Cont IV atx. appreciate GI input  3. AKI. Likely ATN due to sepsis. Cont/increase IVF monitor urine output. Renal function  4. CAD h/o CABG. No active chest pains. Cont ASA. Pt is not on BB. Will consider when sepsis resolves  5. CHF. Chronic. Holding diuretics with sepsis. Cont monitor resume diuresis as needed. Monitor fluid balance  6. DM. ha1c 7.5 (02/2015). Home regimen lantus 40U+ISS. decreased lantus due to poor oral intake. titrate as needed  Prognosis is guarded due to pancreatic cancer related complications, recurrent infection, sepsis at risk for multiorgan dysfunction. -D/w patient. And his  daughter at the bedside. She wants to discuss with her other family regarding GOC  DVT proph: hold Lovenox due to thrombocytopenia riskl of bleeding. Cont SCD  Code Status: Full Family Communication: d/w patient, Therapist, sports. 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  - sphincterotomy was performed, o ne plastic stent was placed into the common bile duct.  - recommendation was to repeat ERCP in 3 months to exchange stent  EGD/EUS 11/03  - Normal esophagus, Normal stomach.  - Biliary stent emerging from the major ampulla.  - Otherwise normal examined duodenum.  - mass identified in the pancreatic head, ? adenoca, staged T3 N0 Mx by endosonographic criteria.  - cystic lesion was seen in the pancreatic body.  - main pancreatic duct had a dilated endosonographic appearance with prominent side branches  - One stent was visualized endosonographically in the common bile duct.   ERCP (1/9)" Copious amounts of frank pus within the bile duct with existing stent migrated distally  Antibiotics:  Cefepime 1/8>>>  vanc 1/8>>> (indicate start date, and stop date if known)  HPI/Subjective: Alert, confused to time. Patient is still septic, febrile, tachycardic. S/p ERCP on 1/9  Objective: Filed Vitals:   05/24/2015 0700 05/24/2015 0800  BP: 126/55 126/59  Pulse: 132 133  Temp:  99 F (37.2 C)  Resp: 14 32    Intake/Output Summary (Last 24 hours) at 2015-05-24 0923 Last data filed at 05/24/2015 0800  Gross per 24 hour  Intake 3257.92 ml  Output  340 ml  Net 2917.92 ml   Filed Weights   05/03/2015 0953 04/27/2015 0600 05/10/2015 0500  Weight: 82.6 kg (182 lb 1.6 oz) 83.6 kg (184 lb 4.9 oz) 89.6 kg (197 lb 8.5 oz)    Exam:   General:  Alert, confused to  time   Cardiovascular: s1,s2 tachycardia   Respiratory: few rales LL  Abdomen: soft, epigastric mild tender, no rebound   Musculoskeletal:  Mild pedal edema    Data Reviewed: Basic Metabolic Panel:  Recent Labs Lab 04/25/15 1001 05/15/2015 1019 05/13/2015 1716 04/26/2015 0500 May 10, 2015 0335  NA 136 146* 143 145 147*  K 4.5 4.2 3.8 4.2 4.4  CL 101 112* 109 113* 115*  CO2 29 24 24  19* 19*  GLUCOSE 350* 86 140* 241* 156*  BUN 24* 19 19 23* 44*  CREATININE 1.61* 1.17 1.37* 1.67* 2.45*  CALCIUM 8.4* 8.7* 8.3* 7.7* 7.7*   Liver Function Tests:  Recent Labs Lab 04/25/15 1001 05/17/2015 1019 04/26/2015 1716 05/22/2015 0500 05/10/2015 0335  AST 26 71* 99* 103* 235*  ALT 24 34 47 51 103*  ALKPHOS 248* 421* 479* 414* 338*  BILITOT 0.4 1.2 2.6* 3.7* 3.0*  PROT 6.9 6.3* 6.0* 5.6* 5.6*  ALBUMIN 2.8* 2.5* 2.3* 2.0* 2.0*   No results for input(s): LIPASE, AMYLASE in the last 168 hours. No results for input(s): AMMONIA in the last 168 hours. CBC:  Recent Labs Lab 04/25/15 1001 05/07/2015 1019 05/17/2015 1716 04/30/2015 0652 05/10/2015 0335  WBC 7.0 7.6 9.9 12.3* 9.2  NEUTROABS 5.1 5.8 8.6*  --  7.8*  HGB 11.6* 10.9* 10.7* 10.0* 9.4*  HCT 36.1* 35.7* 35.5* 33.0* 30.7*  MCV 85.3 89.5 89.0 90.2 90.8  PLT 176 179 153 138* 95*   Cardiac Enzymes: No results for input(s): CKTOTAL, CKMB, CKMBINDEX, TROPONINI in the last 168 hours. BNP (last 3 results)  Recent Labs  10/25/14 2326  BNP 53.0    ProBNP (last 3 results) No results for input(s): PROBNP in the last 8760 hours.  CBG:  Recent Labs Lab 05/04/2015 1233 05/10/2015 1607 04/25/2015 2005 04/24/2015 2140 May 10, 2015 0825  GLUCAP 224* 221* 160* 145* 146*    Recent Results (from the past 240 hour(s))  Culture, blood (Routine X 2) w Reflex to ID Panel     Status: None (Preliminary result)   Collection Time: 05/10/2015 10:15 AM  Result Value Ref Range Status   Specimen Description BLOOD LEFT HAND  Final   Special Requests BOTTLES DRAWN  AEROBIC AND ANAEROBIC 5CC  Final   Culture   Final    NO GROWTH 1 DAY Performed at [facility]    Report Status PENDING  Incomplete  Culture, blood (Routine X 2) w Reflex to ID Panel     Status: None (Preliminary result)   Collection Time: 05/09/2015 10:40 AM  Result Value Ref Range Status   Specimen Description BLOOD LEFT WRIST  Final   Special Requests BOTTLES DRAWN AEROBIC AND ANAEROBIC 5CC  Final   Culture   Final    NO GROWTH < 24 HOURS Performed at [facility]    Report Status PENDING  Incomplete  Urine culture     Status: None   Collection Time: 05/16/2015 12:05 PM  Result Value Ref Range Status   Specimen Description URINE, CLEAN CATCH  Final   Special Requests NONE  Final   Culture   Final    1,000 COLONIES/mL INSIGNIFICANT GROWTH Performed at [facility]    Report Status 05/14/2015  FINAL  Final  MRSA PCR Screening     Status: None   Collection Time: 04/23/2015  4:43 PM  Result Value Ref Range Status   MRSA by PCR NEGATIVE NEGATIVE Final    Comment:        The GeneXpert MRSA Assay (FDA approved for NASAL specimens only), is one component of a comprehensive MRSA colonization surveillance program. It is not intended to diagnose MRSA infection nor to guide or monitor treatment for MRSA infections.      Studies: Ct Abdomen Pelvis W Contrast  05/08/2015  CLINICAL DATA:  Pancreas cancer.  Generalize weakness. EXAM: CT ABDOMEN AND PELVIS WITH CONTRAST TECHNIQUE: Multidetector CT imaging of the abdomen and pelvis was performed using the standard protocol following bolus administration of intravenous contrast. CONTRAST:  96mL OMNIPAQUE IOHEXOL 300 MG/ML SOLN, 133mL OMNIPAQUE IOHEXOL 300 MG/ML SOLN COMPARISON:  03/29/2015 FINDINGS: Lower chest: Small pleural effusions noted, left greater than right. Hepatobiliary: No focal liver abnormality identified. There is a common bile duct stent in place. Progressive dilatation of the common bile duct is noted which  now measures 1.2 cm. Pneumobilia however is present confirming biliary patency. Pneumobilia is identified compatible with biliary patency. Pancreas: Progressive dilatation of the pancreatic duct. Ste the mass within the head of pancreas measures 2.1 cm, image 34 of series 2. This is compared with 1.9 cm previously. Spleen: Normal appearance of the spleen. Adrenals/Urinary Tract: The adrenal glands are both normal. The left kidney is unremarkable. Cyst within the inferior pole of the right kidney measures 1.4 cm. No obstructive uropathy. The urinary bladder appears normal. Stomach/Bowel: Small hiatal hernia. The stomach and small bowel loops are otherwise unremarkable. No pathologic dilatation of the large or small bowel loops identified. Vascular/Lymphatic: Calcified atherosclerotic disease involves the abdominal aorta. No aneurysm. No enlarged retroperitoneal or mesenteric adenopathy. No enlarged pelvic or inguinal lymph nodes. Portacaval lymph node measures 1.4 cm, image 25 of series 2. This is compared with 1.3 cm previously. The index pre caval lymph node measures 1 cm, image 31 of series 2. Unchanged from previous exam. No enlarged pelvic lymph nodes. Reproductive: The prostate gland and seminal vesicles are unremarkable. Other: There is no ascites or focal fluid collections within the abdomen or pelvis. Musculoskeletal: There is an acute right lateral eighth rib fracture identified, image number 2 of series 2. No aggressive lytic or sclerotic bone lesions identified. IMPRESSION: 1. Progressive dilatation of the common bile duct and pancreatic duct status post CBD stenting. Pneumobilia is identified compatible with common bile duct patency. Pancreatic head mass is not significantly changed in size from previous exam. 2. There is an acute right eighth rib fracture identified. 3. Aortic atherosclerosis 4. No evidence for bowel obstruction or abscess formation. Electronically Signed   By: Kerby Moors M.D.   On:  05/11/2015 13:52   Dg Chest Port 1 View  05/11/2015  CLINICAL DATA:  75 year old male with fever EXAM: PORTABLE CHEST 1 VIEW COMPARISON:  Prior chest x-ray 04/14/2015 FINDINGS: Cardiac and mediastinal contours remain unchanged. Patient is status post median sternotomy with evidence of prior multivessel CABG. Healing right-sided rib fractures. No new focal consolidation, pneumonia or pleural effusion. Mild pulmonary vascular congestion without overt edema. Stable bronchitic change. No acute osseous abnormality. IMPRESSION: 1. No acute cardiopulmonary process. 2. Healing right-sided rib fractures. No pneumothorax or pleural effusion. 3. Aortic atherosclerosis. Electronically Signed   By: Jacqulynn Cadet M.D.   On: 05/07/2015 10:57   Dg Ercp  05-12-15  CLINICAL DATA:  75 year old male with a history of pancreatic adenocarcinoma EXAM: ERCP TECHNIQUE: Multiple spot images obtained with the fluoroscopic device and submitted for interpretation post-procedure. FLUOROSCOPY TIME:  1 minutes 26 seconds for a total of 42.39 mGy reported Please see GI operative note for further detail COMPARISON:  CT abdomen/ pelvis 05/03/2015 FINDINGS: A total of 3 intraoperative spot images demonstrate a flexible endoscope in the descending duodenum with cannulation of the common bile duct. Contrast opacification of the common duct demonstrates dilatation of the common hepatic and common bile ducts and irregular stricturing of the distal common bile duct. On the final image, a metallic stent has been deployed. IMPRESSION: ERCP with covered metal biliary stent placement. Prior to stent placement there is an irregular narrowing of the distal common bile duct concerning for malignant stricture. These images were submitted for radiologic interpretation only. Please see the procedural report for the amount of contrast and the fluoroscopy time utilized. Electronically Signed   By: Jacqulynn Cadet M.D.   On: May 18, 2015 07:35     Scheduled Meds: . aspirin EC  81 mg Oral Daily  . ceFEPime (MAXIPIME) IV  1 g Intravenous Q12H  . feeding supplement (ENSURE ENLIVE)  237 mL Oral Q24H  . feeding supplement (PRO-STAT SUGAR FREE 64)  30 mL Oral BID  . fentaNYL  25 mcg Transdermal Q72H  . insulin aspart  0-9 Units Subcutaneous TID WC  . insulin glargine  5 Units Subcutaneous BID  . pantoprazole  40 mg Oral Daily  . senna  4 tablet Oral Daily  . sodium chloride  3 mL Intravenous Q12H  . [START ON 05/03/2015] vancomycin  1,000 mg Intravenous Q48H   Continuous Infusions: . sodium chloride 150 mL/hr at 05-18-2015 0751    Principal Problem:   Sepsis ([place]) Active Problems:   Pancreatic cancer ([place])   Weakness   Acute kidney injury ([place])   Hypernatremia   Anemia of chronic disease   DM (diabetes mellitus) type II controlled, neurological manifestation ([place])    Time spent: >35 minutes     Kinnie Feil  [facility] Pager [phone]. If 7PM-7AM, please contact night-coverage at [email or web address], password [facility] May 18, 2015, 9:23 AM  LOS: 2 days

## 2015-05-24 NOTE — Progress Notes (Addendum)
Patient passed at 55 with family and RN at bedside. CCM and Dr. Daleen Bo both made aware. Family provided with resources and assistance via Social Work in contacting and speaking with the [employer] in order to get the patient's Grandson permission to visit home from his deployment in the middle [place]. Belongings sent home with patient's daughter Suanne Marker. Death Checklist completed.

## 2015-05-24 NOTE — Progress Notes (Signed)
Patient is more septic, tachycardic, respiratory distress. D/w PCCM. Dr. Elsworth Soho who kindly agreed to see the patient.  [name]

## 2015-05-24 NOTE — Progress Notes (Signed)
Patient sounding more ronchus and appears to be more labored in his breathing. Sinus tachy up to the 140's. BP stable. Still with significant confusion. Dr. Daleen Bo made aware.

## 2015-05-24 NOTE — Progress Notes (Addendum)
   05-09-2015 1245  Vitals  Pulse Rate (!) 43  ECG Heart Rate (!) 43  Resp 10  Oxygen Therapy  SpO2 (!) 46 %  O2 Device Non-rebreather Mask   Patient began to desaturate despite NRB, became fully unresponsive, foam noted in the mouth. Oral suction used to remove secretions. DNR status at this time. Daughter walked in room as episode was happening. CCM NP notified via phone.

## 2015-05-24 NOTE — Progress Notes (Signed)
   May 23, 2015 1300  Clinical Encounter Type  Visited With Family  Visit Type Initial;Psychological support;Spiritual support;Critical Care;Other (Comment)  Referral From Nurse  Consult/Referral To Chaplain  Spiritual Encounters  Spiritual Needs Emotional;Other (Comment) Academic librarian)  Stress Factors  Patient Stress Factors None identified  Family Stress Factors Loss;Major life changes   I was called to the patient's room by the nurse who stated that the patient was unstable. A daughter and friend were at the bedside and seemed to exhibit appropriate grief. They understood the severity of the patient's condition and feel at peace with the fact that he might die soon.  The family has good support. The Chaplain will stay follow-up.    [place] M.Div.

## 2015-05-24 DEATH — deceased

## 2015-05-25 ENCOUNTER — Ambulatory Visit: Payer: Commercial Managed Care - HMO

## 2015-05-25 ENCOUNTER — Other Ambulatory Visit: Payer: Self-pay | Admitting: Internal Medicine

## 2015-05-26 ENCOUNTER — Ambulatory Visit: Payer: Commercial Managed Care - HMO

## 2015-05-29 ENCOUNTER — Ambulatory Visit: Payer: Commercial Managed Care - HMO

## 2015-05-30 ENCOUNTER — Ambulatory Visit: Payer: Commercial Managed Care - HMO

## 2015-05-31 ENCOUNTER — Ambulatory Visit: Payer: Commercial Managed Care - HMO

## 2015-12-20 NOTE — Progress Notes (Signed)
This encounter was created in error - please disregard.

## 2017-05-22 IMAGING — CR DG CHEST 1V PORT
1 series · 1 of 1 positions shown · non-contrast
Comparison: Chest radiograph 02/25/2015.  CT chest 02/25/2015.

CLINICAL DATA: Fever.  Pancreatic cancer.

EXAM:
PORTABLE CHEST 1 VIEW

[portable]
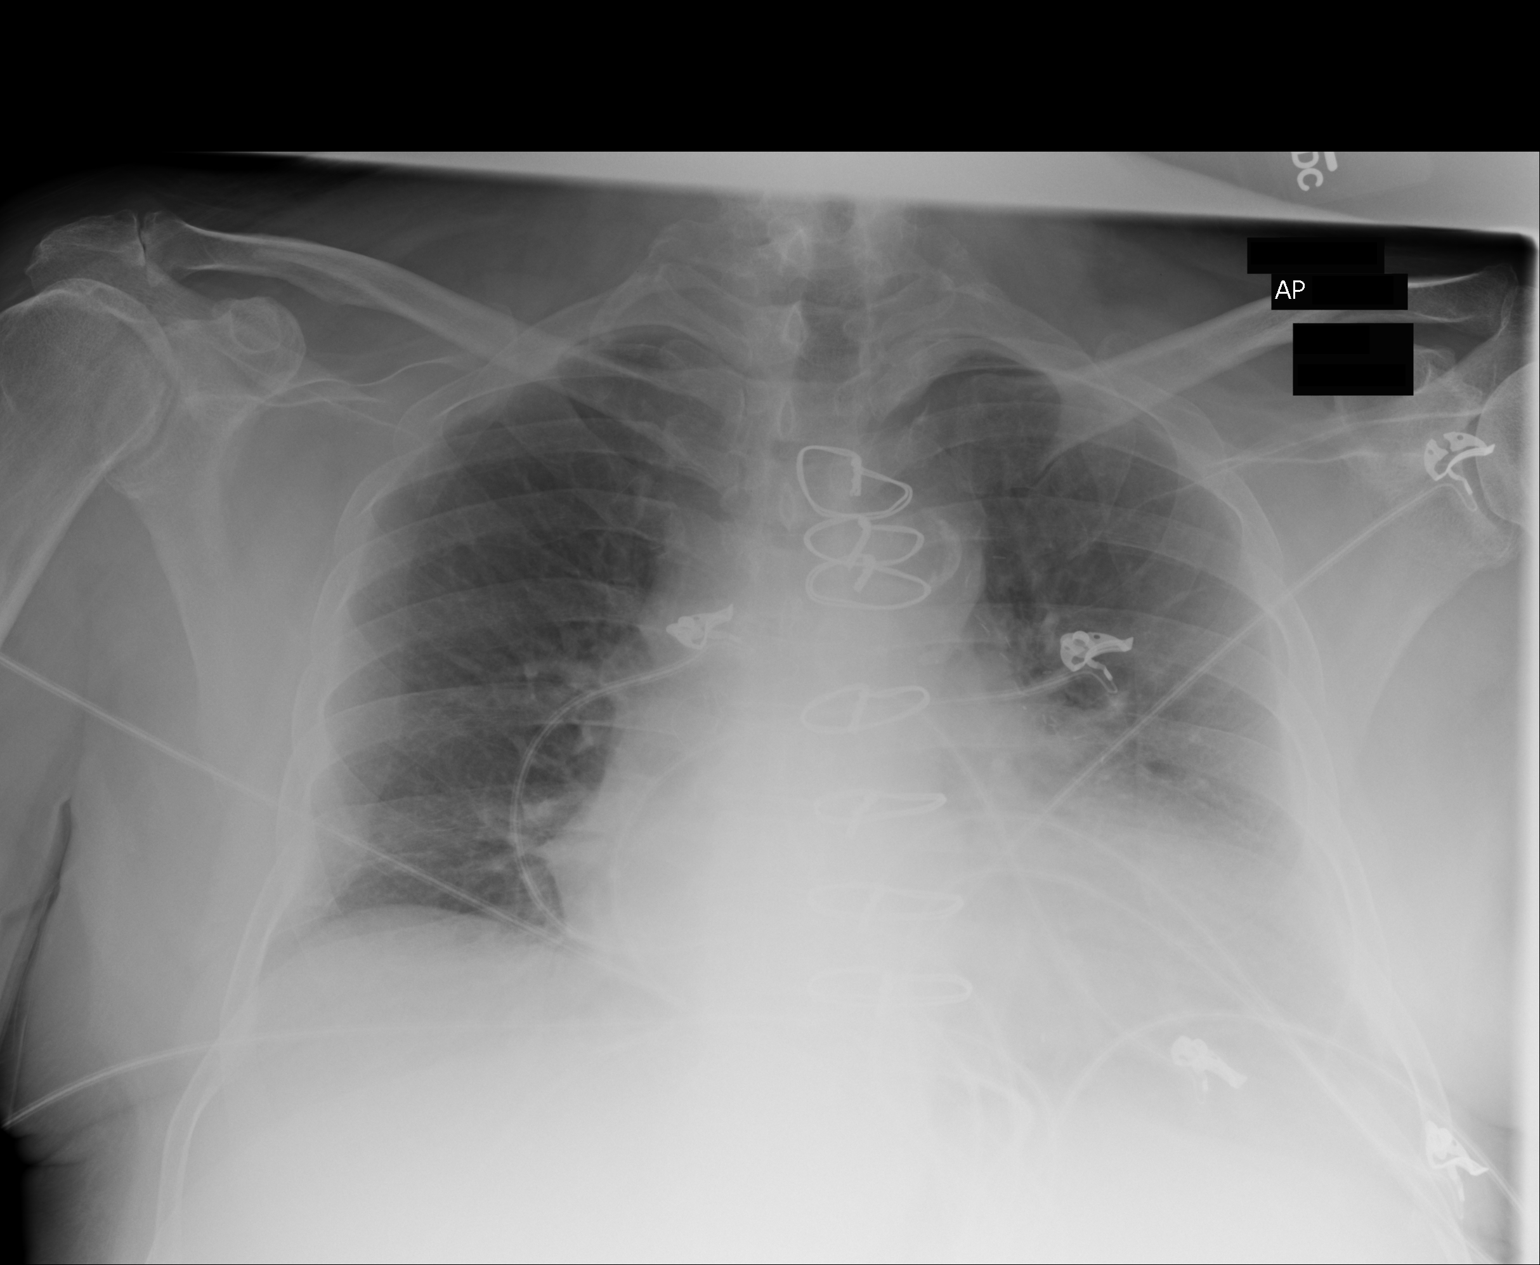

[1 of 1 positions shown; findings below may reference images not displayed]

FINDINGS: The heart is enlarged. There is prior CABG. Thoracic
atherosclerosis. Low lung volumes. No definite active infiltrates or
failure. No osseous findings.
IMPRESSION: Low lung volumes. No definite active infiltrates or failure.
Cardiomegaly.

## 2017-05-25 IMAGING — CR DG CHEST 1V PORT
1 series · 1 of 1 positions shown · non-contrast
Comparison: 03/29/2015 and earlier, including CT chest 02/25/2015.

CLINICAL DATA: 74-year-old with current history of pancreatic
cancer, sepsis, and persistent fevers.

EXAM:
PORTABLE CHEST 1 VIEW

[ap]
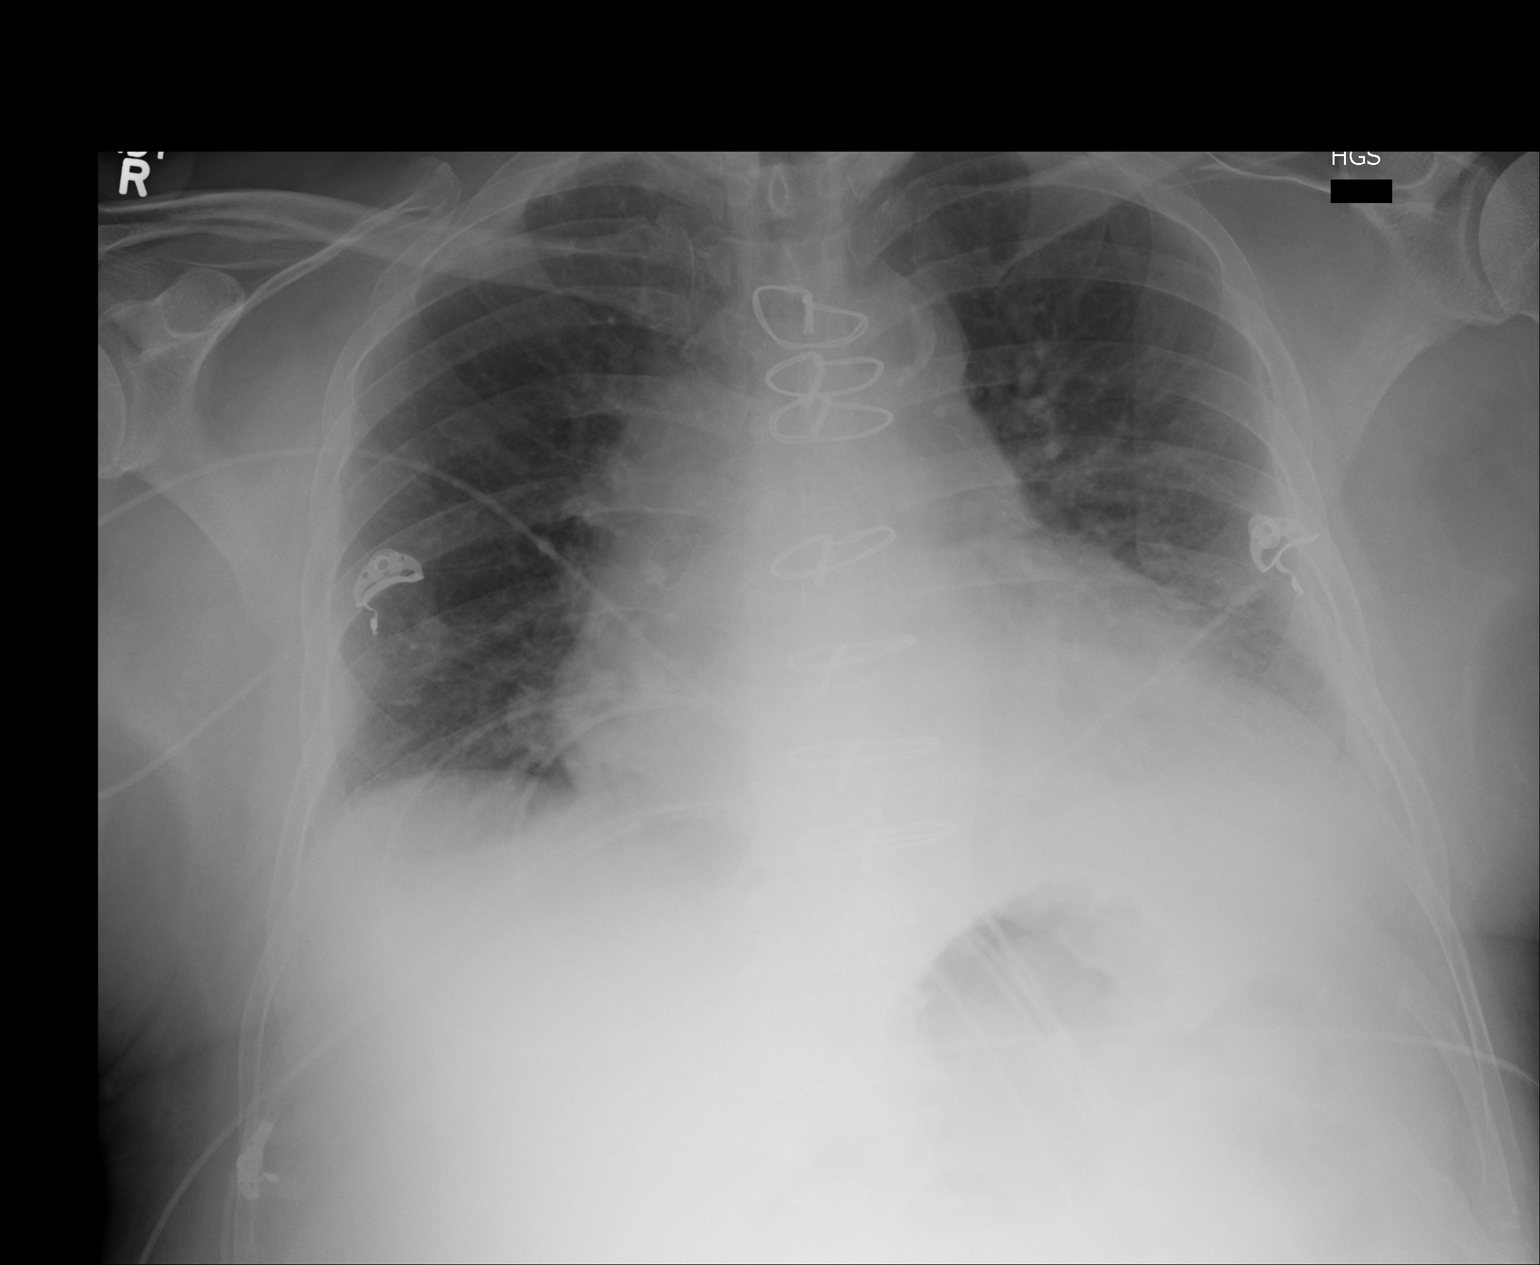

[1 of 1 positions shown; findings below may reference images not displayed]

FINDINGS: Prior sternotomy for CABG. Cardiac silhouette moderately enlarged,
unchanged. Pulmonary vascularity normal without evidence of
pulmonary edema. Suboptimal inspiration with atelectasis in the lung
bases, left greater than right, increased since the examination 3
days ago. No new pulmonary parenchymal abnormalities elsewhere.
IMPRESSION: 1. Suboptimal inspiration accounts for bibasilar atelectasis, left
greater than right, worse than the examination 3 days ago.
2. Stable cardiomegaly without pulmonary edema.
3. No new abnormalities elsewhere.

## 2017-06-07 IMAGING — CR DG RIBS W/ CHEST 3+V*R*
3 series · 3 of 3 positions shown · non-contrast
Comparison: 04/01/2015

CLINICAL DATA: Fall with right rib pain.  Initial encounter.

EXAM:
RIGHT RIBS AND CHEST - 3+ VIEW

[chest pa]
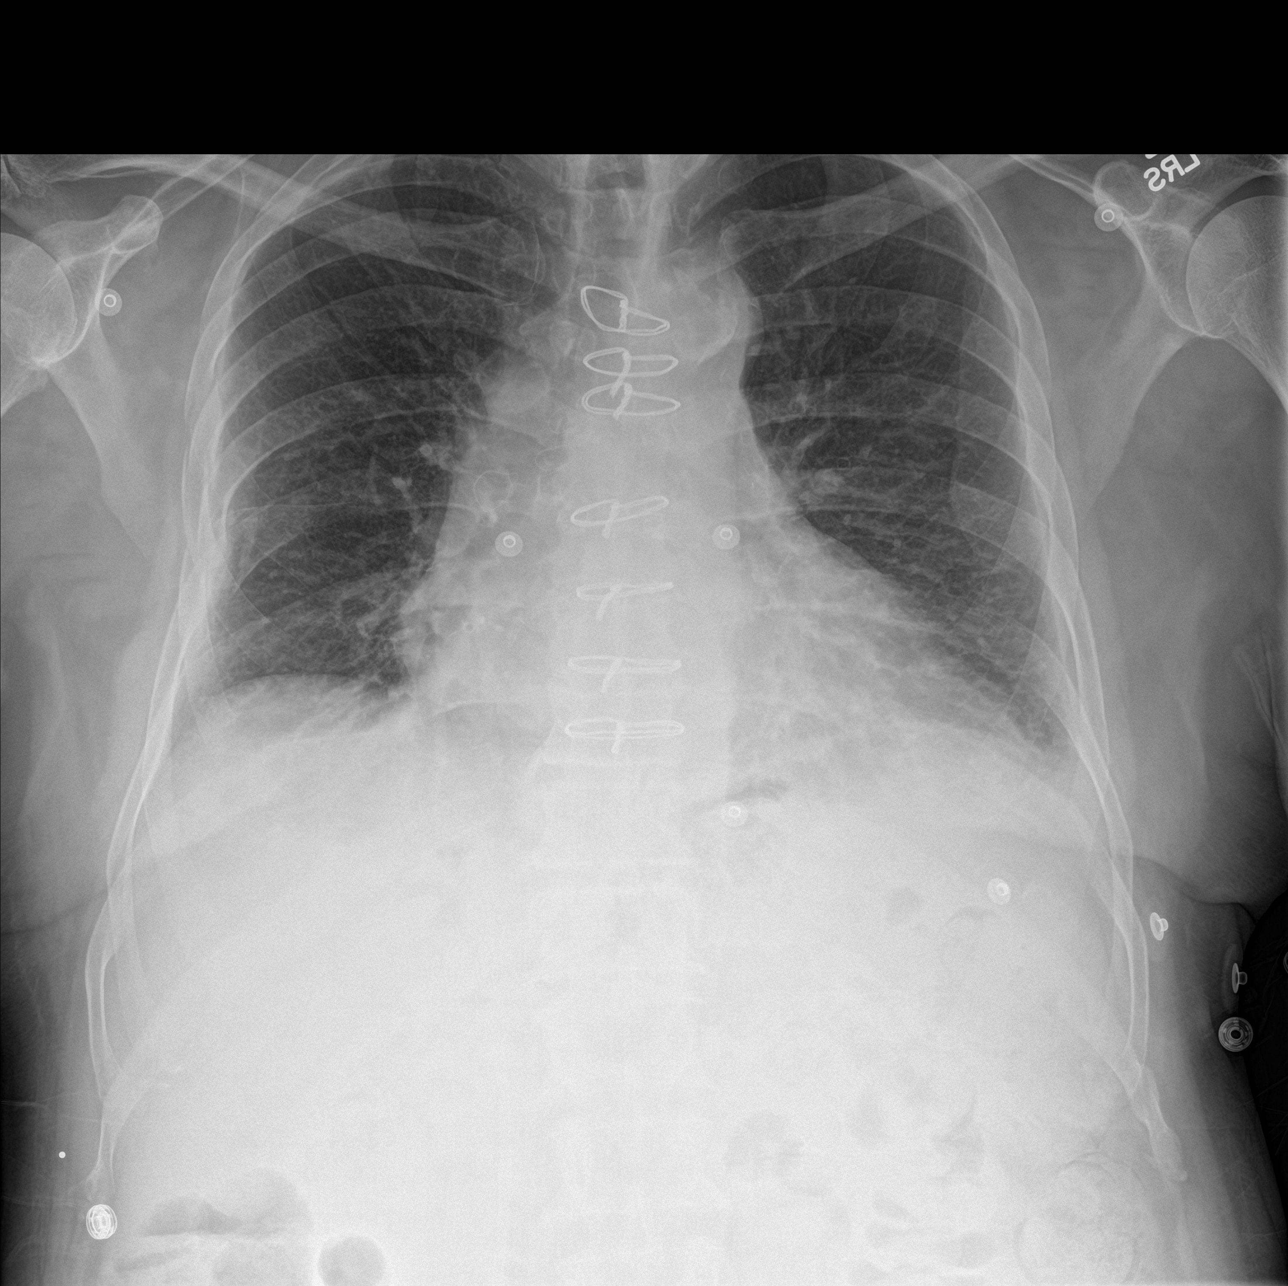

[rib pa]
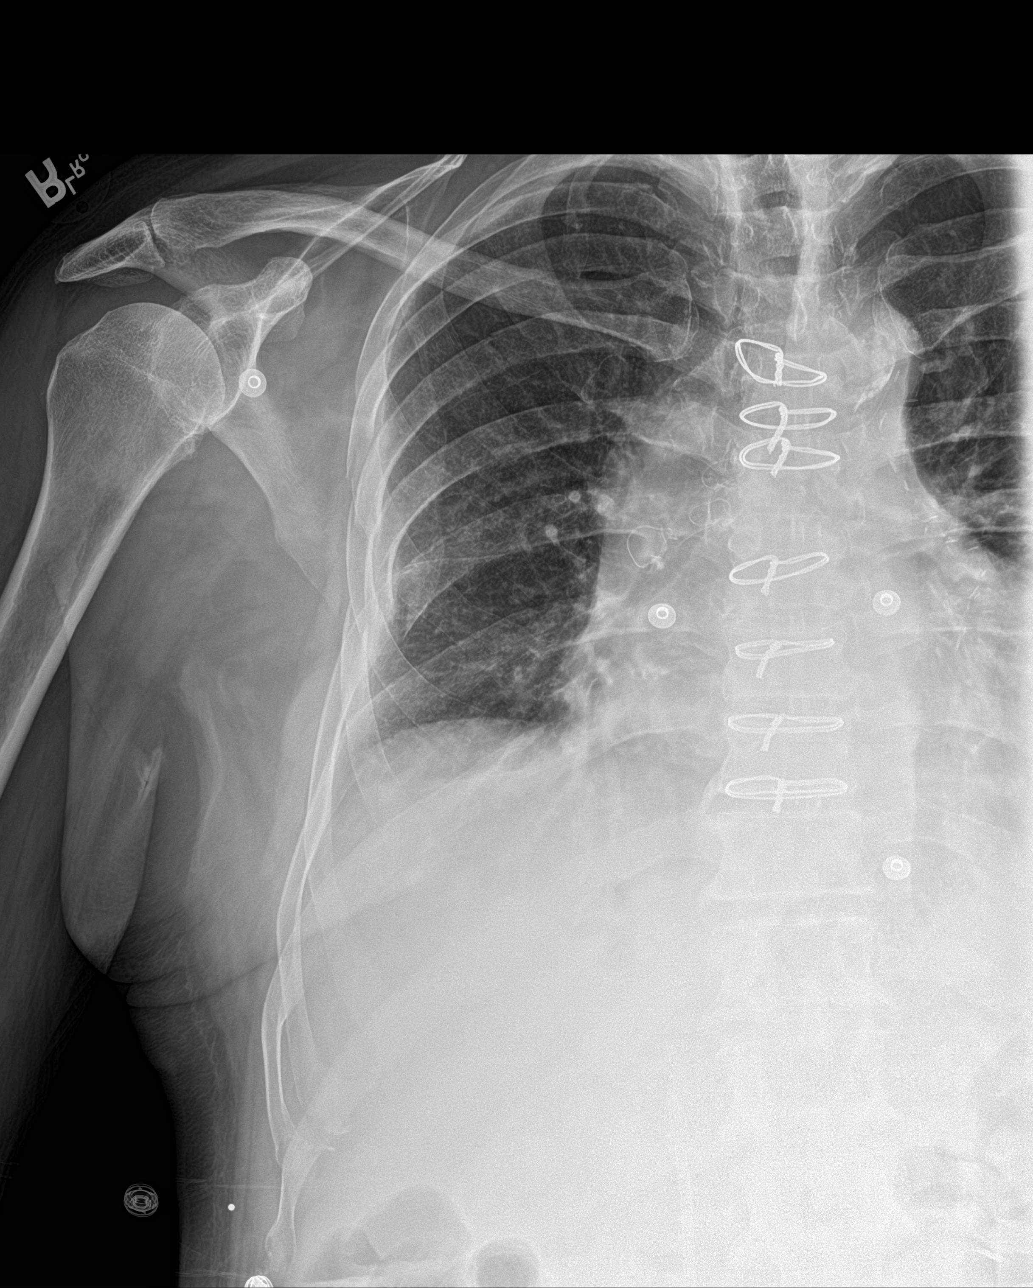

[rib pa obl]
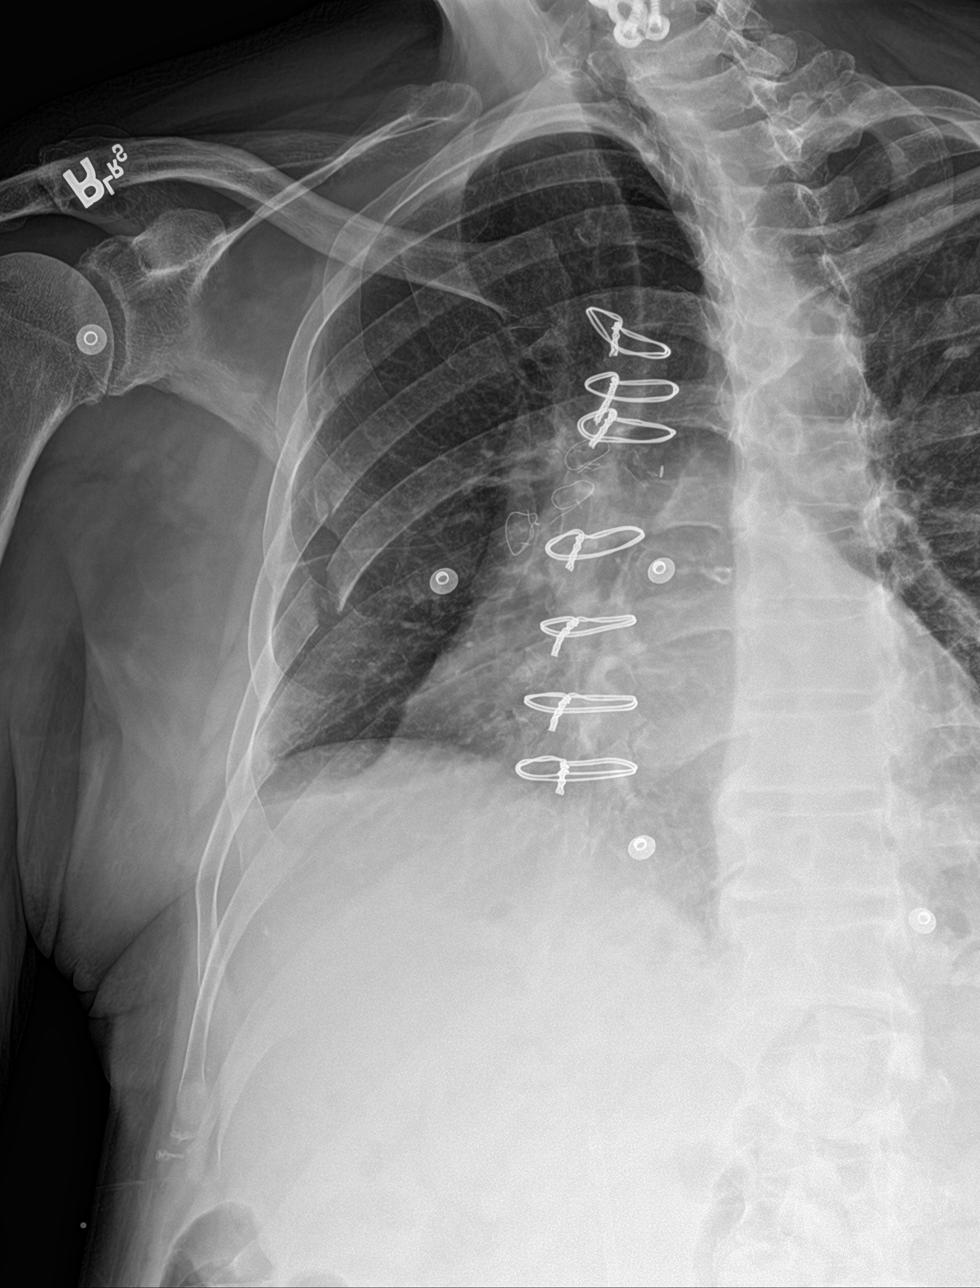

[3 of 3 positions shown; findings below may reference images not displayed]

FINDINGS: Acute fractures of the right fourth, fifth, sixth, and seventh ribs
with up to 100% displacement of the sixth rib. Fifth rib fracture is
segmental.

No pneumothorax or hemothorax.

Right cervical rib.

Chronic cardiomegaly.  The patient is status post CABG.
IMPRESSION: 1. Right fourth through seventh rib fractures with displacement and
single segmental injury.
2. No pneumothorax or hemothorax.
